# Patient Record
Sex: Female | Born: 1945 | Race: White | Hispanic: No | Smoking: Former smoker
Health system: Southern US, Community
[De-identification: ages and names within clinical notes are randomized; demographics above are authoritative.]

## PROBLEM LIST (undated history)

## (undated) DIAGNOSIS — J449 Chronic obstructive pulmonary disease, unspecified: Secondary | ICD-10-CM

## (undated) DIAGNOSIS — R519 Headache, unspecified: Secondary | ICD-10-CM

## (undated) DIAGNOSIS — K56699 Other intestinal obstruction unspecified as to partial versus complete obstruction: Secondary | ICD-10-CM

## (undated) DIAGNOSIS — K52831 Collagenous colitis: Secondary | ICD-10-CM

## (undated) DIAGNOSIS — F32A Depression, unspecified: Secondary | ICD-10-CM

## (undated) DIAGNOSIS — M1990 Unspecified osteoarthritis, unspecified site: Secondary | ICD-10-CM

## (undated) DIAGNOSIS — K5790 Diverticulosis of intestine, part unspecified, without perforation or abscess without bleeding: Secondary | ICD-10-CM

## (undated) DIAGNOSIS — T8859XA Other complications of anesthesia, initial encounter: Secondary | ICD-10-CM

## (undated) DIAGNOSIS — I639 Cerebral infarction, unspecified: Secondary | ICD-10-CM

## (undated) DIAGNOSIS — G245 Blepharospasm: Secondary | ICD-10-CM

## (undated) DIAGNOSIS — K660 Peritoneal adhesions (postprocedural) (postinfection): Secondary | ICD-10-CM

## (undated) DIAGNOSIS — K649 Unspecified hemorrhoids: Secondary | ICD-10-CM

## (undated) DIAGNOSIS — C801 Malignant (primary) neoplasm, unspecified: Secondary | ICD-10-CM

## (undated) DIAGNOSIS — Z8719 Personal history of other diseases of the digestive system: Secondary | ICD-10-CM

## (undated) DIAGNOSIS — D509 Iron deficiency anemia, unspecified: Secondary | ICD-10-CM

## (undated) DIAGNOSIS — K529 Noninfective gastroenteritis and colitis, unspecified: Secondary | ICD-10-CM

## (undated) DIAGNOSIS — M87051 Idiopathic aseptic necrosis of right femur: Secondary | ICD-10-CM

## (undated) DIAGNOSIS — K259 Gastric ulcer, unspecified as acute or chronic, without hemorrhage or perforation: Secondary | ICD-10-CM

## (undated) DIAGNOSIS — K311 Adult hypertrophic pyloric stenosis: Secondary | ICD-10-CM

## (undated) DIAGNOSIS — F329 Major depressive disorder, single episode, unspecified: Secondary | ICD-10-CM

## (undated) DIAGNOSIS — E162 Hypoglycemia, unspecified: Secondary | ICD-10-CM

## (undated) HISTORY — PX: BREAST SURGERY: SHX581

## (undated) HISTORY — PX: CATARACT EXTRACTION: SUR2

## (undated) HISTORY — PX: LYSIS OF ADHESION: SHX5961

## (undated) HISTORY — DX: Unspecified hemorrhoids: K64.9

## (undated) HISTORY — PX: JOINT REPLACEMENT: SHX530

## (undated) HISTORY — PX: OTHER SURGICAL HISTORY: SHX169

## (undated) HISTORY — PX: LAPAROSCOPIC LYSIS OF ADHESIONS: SHX5905

## (undated) HISTORY — DX: Cerebral infarction, unspecified: I63.9

## (undated) HISTORY — PX: ESOPHAGOSCOPY WITH DILITATION: SHX5618

## (undated) HISTORY — PX: GASTRECTOMY: SHX58

## (undated) HISTORY — DX: Malignant (primary) neoplasm, unspecified: C80.1

## (undated) HISTORY — PX: EYE SURGERY: SHX253

## (undated) HISTORY — DX: Gastric ulcer, unspecified as acute or chronic, without hemorrhage or perforation: K25.9

## (undated) HISTORY — DX: Unspecified osteoarthritis, unspecified site: M19.90

---
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
## 1971-01-30 HISTORY — PX: PLACEMENT OF BREAST IMPLANTS: SHX6334

## 1987-01-30 HISTORY — PX: APPENDECTOMY: SHX54

## 2003-01-30 HISTORY — PX: COLON RESECTION: SHX5231

## 2003-05-24 ENCOUNTER — Other Ambulatory Visit: Payer: Self-pay

## 2004-01-03 ENCOUNTER — Ambulatory Visit: Payer: Self-pay | Admitting: General Surgery

## 2004-01-30 HISTORY — PX: COLOSTOMY REVERSAL: SHX5782

## 2005-04-16 ENCOUNTER — Ambulatory Visit: Payer: Self-pay | Admitting: General Surgery

## 2005-06-21 ENCOUNTER — Ambulatory Visit: Payer: Self-pay | Admitting: General Surgery

## 2005-07-06 ENCOUNTER — Ambulatory Visit: Payer: Self-pay | Admitting: General Surgery

## 2005-07-10 ENCOUNTER — Ambulatory Visit: Payer: Self-pay | Admitting: General Surgery

## 2005-08-15 ENCOUNTER — Emergency Department: Payer: Self-pay

## 2005-08-15 ENCOUNTER — Other Ambulatory Visit: Payer: Self-pay

## 2006-02-05 ENCOUNTER — Ambulatory Visit: Payer: Self-pay | Admitting: Ophthalmology

## 2006-02-26 ENCOUNTER — Ambulatory Visit: Payer: Self-pay | Admitting: Ophthalmology

## 2006-04-11 ENCOUNTER — Ambulatory Visit: Payer: Self-pay | Admitting: Internal Medicine

## 2007-08-07 ENCOUNTER — Ambulatory Visit: Payer: Self-pay | Admitting: Internal Medicine

## 2008-03-11 ENCOUNTER — Ambulatory Visit: Payer: Self-pay | Admitting: Unknown Physician Specialty

## 2008-03-18 ENCOUNTER — Ambulatory Visit: Payer: Self-pay | Admitting: Unknown Physician Specialty

## 2008-04-01 ENCOUNTER — Ambulatory Visit: Payer: Self-pay | Admitting: Unknown Physician Specialty

## 2008-04-26 ENCOUNTER — Ambulatory Visit: Payer: Self-pay | Admitting: Unknown Physician Specialty

## 2008-07-27 ENCOUNTER — Ambulatory Visit: Payer: Self-pay | Admitting: Specialist

## 2008-10-29 ENCOUNTER — Ambulatory Visit: Payer: Self-pay | Admitting: General Surgery

## 2008-10-29 HISTORY — PX: UPPER GASTROINTESTINAL ENDOSCOPY: SHX188

## 2008-10-29 HISTORY — PX: COLONOSCOPY: SHX174

## 2008-12-13 ENCOUNTER — Ambulatory Visit: Payer: Self-pay | Admitting: General Surgery

## 2008-12-29 ENCOUNTER — Ambulatory Visit: Payer: Self-pay | Admitting: General Surgery

## 2009-01-29 DIAGNOSIS — C801 Malignant (primary) neoplasm, unspecified: Secondary | ICD-10-CM

## 2009-01-29 HISTORY — PX: LUNG CANCER SURGERY: SHX702

## 2009-01-29 HISTORY — DX: Malignant (primary) neoplasm, unspecified: C80.1

## 2009-02-03 ENCOUNTER — Ambulatory Visit: Payer: Self-pay | Admitting: General Surgery

## 2009-02-10 ENCOUNTER — Ambulatory Visit: Payer: Self-pay | Admitting: Specialist

## 2009-02-21 ENCOUNTER — Ambulatory Visit: Payer: Self-pay | Admitting: Specialist

## 2009-03-01 ENCOUNTER — Ambulatory Visit: Payer: Self-pay | Admitting: Oncology

## 2009-03-02 ENCOUNTER — Ambulatory Visit: Payer: Self-pay | Admitting: Oncology

## 2009-03-04 ENCOUNTER — Ambulatory Visit: Payer: Self-pay | Admitting: General Surgery

## 2009-03-09 ENCOUNTER — Inpatient Hospital Stay: Payer: Self-pay | Admitting: General Surgery

## 2009-03-29 ENCOUNTER — Ambulatory Visit: Payer: Self-pay | Admitting: Oncology

## 2009-04-11 ENCOUNTER — Emergency Department: Payer: Self-pay | Admitting: Emergency Medicine

## 2009-04-14 ENCOUNTER — Ambulatory Visit: Payer: Self-pay | Admitting: General Surgery

## 2009-05-24 ENCOUNTER — Ambulatory Visit: Payer: Self-pay | Admitting: General Surgery

## 2009-06-30 ENCOUNTER — Ambulatory Visit: Payer: Self-pay | Admitting: General Surgery

## 2009-10-04 ENCOUNTER — Ambulatory Visit: Payer: Self-pay | Admitting: Specialist

## 2009-12-06 ENCOUNTER — Ambulatory Visit: Payer: Self-pay | Admitting: Internal Medicine

## 2010-07-18 ENCOUNTER — Ambulatory Visit: Payer: Self-pay | Admitting: Specialist

## 2010-12-08 DIAGNOSIS — C3490 Malignant neoplasm of unspecified part of unspecified bronchus or lung: Secondary | ICD-10-CM | POA: Insufficient documentation

## 2011-12-24 ENCOUNTER — Ambulatory Visit: Payer: Self-pay | Admitting: Ophthalmology

## 2011-12-24 LAB — CREATININE, SERUM
Creatinine: 0.88 mg/dL (ref 0.60–1.30)
EGFR (African American): >60
EGFR (Non-African Amer.): >60

## 2013-11-09 ENCOUNTER — Encounter: Payer: Self-pay | Admitting: *Deleted

## 2013-11-11 ENCOUNTER — Encounter: Payer: Self-pay | Admitting: General Surgery

## 2013-11-11 ENCOUNTER — Ambulatory Visit (INDEPENDENT_AMBULATORY_CARE_PROVIDER_SITE_OTHER): Payer: Medicare Other | Admitting: General Surgery

## 2013-11-11 ENCOUNTER — Other Ambulatory Visit: Payer: Self-pay | Admitting: General Surgery

## 2013-11-11 VITALS — BP 126/80 | HR 80 | Resp 12 | Ht <= 58 in | Wt 91.4 lb

## 2013-11-11 DIAGNOSIS — K311 Adult hypertrophic pyloric stenosis: Secondary | ICD-10-CM

## 2013-11-11 NOTE — Patient Instructions (Addendum)
The patient is aware to call back for any questions or concerns.  Esophagogastroduodenoscopy Esophagogastroduodenoscopy (EGD) is a procedure to examine the lining of the esophagus, stomach, and first part of the small intestine (duodenum). A long, flexible, lighted tube with a camera attached (endoscope) is inserted down the throat to view these organs. This procedure is done to detect problems or abnormalities, such as inflammation, bleeding, ulcers, or growths, in order to treat them. The procedure lasts about 5-20 minutes. It is usually an outpatient procedure, but it may need to be performed in emergency cases in the hospital. LET YOUR CAREGIVER KNOW ABOUT:   Allergies to food or medicine.  All medicines you are taking, including vitamins, herbs, eyedrops, and over-the-counter medicines and creams.  Use of steroids (by mouth or creams).  Previous problems you or members of your family have had with the use of anesthetics.  Any blood disorders you have.  Previous surgeries you have had.  Other health problems you have.  Possibility of pregnancy, if this applies. RISKS AND COMPLICATIONS  Generally, EGD is a safe procedure. However, as with any procedure, complications can occur. Possible complications include:  Infection.  Bleeding.  Tearing (perforation) of the esophagus, stomach, or duodenum.  Difficulty breathing or not being able to breath.  Excessive sweating.  Spasms of the larynx.  Slowed heartbeat.  Low blood pressure. BEFORE THE PROCEDURE  Do not eat or drink anything for 6-8 hours before the procedure or as directed by your caregiver.  Ask your caregiver about changing or stopping your regular medicines.  If you wear dentures, be prepared to remove them before the procedure.  Arrange for someone to drive you home after the procedure. PROCEDURE   A vein will be accessed to give medicines and fluids. A medicine to relax you (sedative) and a pain reliever  will be given through that access into the vein.  A numbing medicine (local anesthetic) may be sprayed on your throat for comfort and to stop you from gagging or coughing.  A mouth guard may be placed in your mouth to protect your teeth and to keep you from biting on the endoscope.  You will be asked to lie on your left side.  The endoscope is inserted down your throat and into the esophagus, stomach, and duodenum.  Air is put through the endoscope to allow your caregiver to view the lining of your esophagus clearly.  The esophagus, stomach, and duodenum is then examined. During the exam, your caregiver may:  Remove tissue to be examined under a microscope (biopsy) for inflammation, infection, or other medical problems.  Remove growths.  Remove objects (foreign bodies) that are stuck.  Treat any bleeding with medicines or other devices that stop tissues from bleeding (hot cautery, clipping devices).  Widen (dilate) or stretch narrowed areas of the esophagus and stomach.  The endoscope will then be withdrawn. AFTER THE PROCEDURE  You will be taken to a recovery area to be monitored. You will be able to go home once you are stable and alert.  Do not eat or drink anything until the local anesthetic and numbing medicines have worn off. You may choke.  It is normal to feel bloated, have pain with swallowing, or have a sore throat for a short time. This will wear off.  Your caregiver should be able to discuss his or her findings with you. It will take longer to discuss the test results if any biopsies were taken. Document Released: 05/18/2004 Document Revised: 06/01/2013  Document Reviewed: 12/19/2011 [facility] Patient Information 2015 [place], [place]. This information is not intended to replace advice given to you by your health care provider. Make sure you discuss any questions you have with your health care provider.  Patient has been scheduled for an upper endoscopy on 11-18-13 at  [facility].

## 2013-11-11 NOTE — Progress Notes (Signed)
Patient ID: Carol Shaffer, female   DOB: 09/25/1945, 68 y.o.   MRN: [id number]  Chief Complaint  Patient presents with  . Nausea    nausea and vomiting    HPI Carol Shaffer is a 68 y.o. female.  Here today for evaluation of abdominal pain, nausea and vomiting. She states that the symptoms started about 5 days ago. Denies difficulty swallowing. When she eats solid foods she has pain, nausea and vomiting, but when she does just liquids and protein shakes she has nausea but no pain. No significant weight loss. She reports similar symptoms in 2012. At that time she was evaluated at [facility] it underwent balloon dilatation of her gastrojejunostomy. She had been asymptomatic since November of 2012 after the second dilatation.  The patient has had multiple previous abdominal procedures.  Bowels moving normally.  HPI  Past Medical History  Diagnosis Date  . Stroke   . Arthritis   . Multiple gastric ulcers   . Hemorrhoid   . Cancer 2011    lung    Past Surgical History  Procedure Laterality Date  . Colonoscopy  10-29-2008    [name]  . Cataract extraction    . Lysis of adhesion    . Colon resection  2005  . Colostomy reversal  2006  . Gastric ulcer  1989, 1991  . Appendectomy  1989  . Placement of breast implants  1973  . Esophagoscopy with dilitation  2012    [facility]  . Lung cancer surgery  2011  . Upper gastrointestinal endoscopy  10-29-2008    [name]    No family history on file.  Social History History  Substance Use Topics  . Smoking status: Former Smoker -- 25 years    Quit date: 01/30/2008  . Smokeless tobacco: Never Used  . Alcohol Use: Yes     Comment: wine    Allergies  Allergen Reactions  . Biaxin [Clarithromycin] Other (See Comments)    hallucinations  . Cortizone-10 [Hydrocortisone] Swelling    injection  . Reglan [Metoclopramide] Other (See Comments)    tremors    Current Outpatient Prescriptions  Medication Sig Dispense Refill  .  HYDROcodone-acetaminophen (NORCO/VICODIN) 5-325 MG per tablet Take 1 tablet by mouth every 6 (six) hours as needed.       . pantoprazole (PROTONIX) 40 MG tablet Take 40 mg by mouth 2 (two) times daily.       . sucralfate (CARAFATE) 1 G tablet Take 1 g by mouth 4 (four) times daily.       . traZODone (DESYREL) 150 MG tablet Take 300 mg by mouth at bedtime.       Marland Kitchen venlafaxine XR (EFFEXOR-XR) 150 MG 24 hr capsule Take 150 mg by mouth daily with breakfast.        No current facility-administered medications for this visit.    Review of Systems Review of Systems  Constitutional: Negative.   Respiratory: Negative.   Cardiovascular: Negative.   Gastrointestinal: Positive for nausea, vomiting and abdominal pain.    Blood pressure 126/80, pulse 80, resp. rate 12, height 4' 8.5" (1.435 m), weight 91 lb 6.4 oz (41.459 kg).  Physical Exam Physical Exam  Constitutional: She is oriented to person, place, and time. She appears well-developed and well-nourished.  Neck: Neck supple.  Cardiovascular: Normal rate, regular rhythm and normal heart sounds.   Pulmonary/Chest: Effort normal and breath sounds normal.  Abdominal: Soft. Normal appearance. A hernia is present.    1 cm  defect at old stoma site.  Lymphadenopathy:    She has no cervical adenopathy.  Neurological: She is alert and oriented to person, place, and time.  Skin: Skin is warm and dry.    Data Reviewed [facility] records from November 2012.  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     Plan    Plan EGD with dilatation. The patient has been encouraged to remain on a high protein low residue diet between now and the time of her upcoming endoscopy. The opportunity to have the procedure completed by another physician at an earlier date was offered and declined..  Patient has been scheduled for an upper endoscopy on 11-18-13 at [facility].    PCP/Ref: Dr. Grayce Sessions, [place] Gleason 11/11/2013, 10:30 PM

## 2013-11-17 ENCOUNTER — Ambulatory Visit: Payer: Self-pay | Admitting: General Surgery

## 2013-11-18 ENCOUNTER — Ambulatory Visit: Payer: Self-pay | Admitting: General Surgery

## 2013-11-18 DIAGNOSIS — K3189 Other diseases of stomach and duodenum: Secondary | ICD-10-CM

## 2013-11-19 ENCOUNTER — Encounter: Payer: Self-pay | Admitting: General Surgery

## 2013-11-30 ENCOUNTER — Encounter: Payer: Self-pay | Admitting: General Surgery

## 2013-12-02 ENCOUNTER — Encounter: Payer: Self-pay | Admitting: General Surgery

## 2013-12-02 ENCOUNTER — Ambulatory Visit (INDEPENDENT_AMBULATORY_CARE_PROVIDER_SITE_OTHER): Payer: Self-pay | Admitting: General Surgery

## 2013-12-02 VITALS — BP 130/66 | HR 70 | Resp 12 | Ht <= 58 in | Wt 89.0 lb

## 2013-12-02 DIAGNOSIS — K311 Adult hypertrophic pyloric stenosis: Secondary | ICD-10-CM

## 2013-12-02 NOTE — Patient Instructions (Signed)
Patient has been scheduled for an upper endoscopy on 12-30-13 at [facility].

## 2013-12-02 NOTE — Progress Notes (Signed)
Patient ID: Carol Shaffer, female   DOB: 02-16-1945, 68 y.o.   MRN: [id number]  Chief Complaint  Patient presents with  . Follow-up    post op upper endo and dilitation    HPI Carol Shaffer is a 68 y.o. female who presents for a post op upper endoscopy an dilatation. The procedure was performed on 11/18/13. The patient reports a significant improvement in her ability to eat, although she still is having some sense of fullness after meals. She has been very careful with her diet.   HPI  Past Medical History  Diagnosis Date  . Stroke   . Arthritis   . Multiple gastric ulcers   . Hemorrhoid   . Cancer 2011    lung    Past Surgical History  Procedure Laterality Date  . Colonoscopy  10-29-2008    [name]  . Cataract extraction    . Lysis of adhesion    . Colon resection  2005  . Colostomy reversal  2006  . Gastric ulcer  1989, 1991  . Appendectomy  1989  . Placement of breast implants  1973  . Esophagoscopy with dilitation  2012,2015    [facility], Carol Shaffer  . Lung cancer surgery  2011  . Upper gastrointestinal endoscopy  10-29-2008    [name]    No family history on file.  Social History History  Substance Use Topics  . Smoking status: Former Smoker -- 25 years    Quit date: 01/30/2008  . Smokeless tobacco: Never Used  . Alcohol Use: Yes     Comment: wine    Allergies  Allergen Reactions  . Bentyl [Dicyclomine] Nausea And Vomiting  . Biaxin [Clarithromycin] Other (See Comments)    hallucinations  . Cortizone-10 [Hydrocortisone] Swelling    injection  . Erythromycin Nausea And Vomiting  . Reglan [Metoclopramide] Other (See Comments)    tremors    Current Outpatient Prescriptions  Medication Sig Dispense Refill  . HYDROcodone-acetaminophen (NORCO/VICODIN) 5-325 MG per tablet Take 1 tablet by mouth every 6 (six) hours as needed.     . Multiple Vitamin (MULTI-VITAMINS) TABS once daily.    . pantoprazole (PROTONIX) 40 MG tablet Take 40 mg by mouth 2 (two) times  daily.     . sucralfate (CARAFATE) 1 G tablet Take 1 g by mouth 4 (four) times daily.     . traZODone (DESYREL) 150 MG tablet Take 300 mg by mouth at bedtime.     Marland Kitchen venlafaxine XR (EFFEXOR-XR) 150 MG 24 hr capsule Take 150 mg by mouth daily with breakfast.      No current facility-administered medications for this visit.    Review of Systems Review of Systems  Constitutional: Negative.   Respiratory: Negative.   Cardiovascular: Negative.   Gastrointestinal: Negative.     Blood pressure 130/66, pulse 70, resp. rate 12, height 4' 8.5" (1.435 m), weight 89 lb (40.37 kg).  Physical Exam Physical Exam  Constitutional: She is oriented to person, place, and time. She appears well-developed and well-nourished.  Cardiovascular: Normal rate, regular rhythm and normal heart sounds.   No murmur heard. Pulmonary/Chest: Effort normal and breath sounds normal.  Neurological: She is alert and oriented to person, place, and time.  Skin: Skin is warm and dry.    Data Reviewed EGD report.  Assessment    Improvement in dietary tolerance post anastomotic dilatation.     Plan    When she required dilatation in the past, she had obtained additional benefit from  a second dilatation. She has a planned trip to walk [employer]. We'll schedule the procedure after her return. In the interim, the avoidance of hard to digest foods were to be discouraged.  The risks associated with dilatation were again reviewed, these include perforation and bleeding.  Patient has been scheduled for an upper endoscopy on 12-30-13 at [facility].    PCP:  Henderson Baltimore 12/04/2013, 8:06 AM

## 2013-12-04 ENCOUNTER — Other Ambulatory Visit: Payer: Self-pay | Admitting: General Surgery

## 2013-12-04 DIAGNOSIS — K311 Adult hypertrophic pyloric stenosis: Secondary | ICD-10-CM

## 2013-12-30 ENCOUNTER — Ambulatory Visit: Payer: Self-pay | Admitting: General Surgery

## 2013-12-30 DIAGNOSIS — K311 Adult hypertrophic pyloric stenosis: Secondary | ICD-10-CM

## 2013-12-31 ENCOUNTER — Encounter: Payer: Self-pay | Admitting: General Surgery

## 2014-01-14 ENCOUNTER — Encounter: Payer: Self-pay | Admitting: General Surgery

## 2016-06-15 ENCOUNTER — Encounter: Payer: Self-pay | Admitting: General Surgery

## 2016-07-03 ENCOUNTER — Encounter: Payer: Self-pay | Admitting: General Surgery

## 2016-07-03 ENCOUNTER — Ambulatory Visit (INDEPENDENT_AMBULATORY_CARE_PROVIDER_SITE_OTHER): Payer: Medicare Other | Admitting: General Surgery

## 2016-07-03 VITALS — BP 140/80 | HR 100 | Resp 14 | Ht 59.0 in | Wt 89.0 lb

## 2016-07-03 DIAGNOSIS — R112 Nausea with vomiting, unspecified: Secondary | ICD-10-CM | POA: Diagnosis not present

## 2016-07-03 HISTORY — DX: Nausea with vomiting, unspecified: R11.2

## 2016-07-03 NOTE — Progress Notes (Signed)
Patient ID: Carol Shaffer, female   DOB: [birth year], 71 y.o.   MRN: [id number]  Chief Complaint  Patient presents with  . Other    vomiting with stricture    HPI Carol Shaffer is a 71 y.o. female.  Here today for evaluation of esophageal stricture. She states she has had some vomiting. She states this started about a month and she vomits three times a week. Vomitus typically consist of undigested food. Patient states she has been "vomiting" in the middle of the night.this sounds more like reflux based on her description. No heart burn or pain. Last meal is at 5 o'clock.   Weight is unchanged from that noted at the time of her November 2015 exam.   HPI  Past Medical History:  Diagnosis Date  . Arthritis   . Cancer ([place]) 2011   lung  . Hemorrhoid   . Multiple gastric ulcers   . Stroke Carol Shaffer)     Past Surgical History:  Procedure Laterality Date  . APPENDECTOMY  1989  . CATARACT EXTRACTION    . COLON RESECTION  2005  . COLONOSCOPY  10-29-2008   [name]  . COLOSTOMY REVERSAL  2006  . ESOPHAGOSCOPY WITH DILITATION  2012,2015   [facility], [place]  . gastric ulcer  1989, 1991  . LUNG CANCER SURGERY  2011  . LYSIS OF ADHESION    . PLACEMENT OF BREAST IMPLANTS  1973  . UPPER GASTROINTESTINAL ENDOSCOPY  10-29-2008   [name]    No family history on file.  Social History Social History  Substance Use Topics  . Smoking status: Former Smoker    Years: 25.00    Quit date: 01/30/2008  . Smokeless tobacco: Never Used  . Alcohol use Yes     Comment: wine    Allergies  Allergen Reactions  . Bentyl [Dicyclomine] Nausea And Vomiting  . Biaxin [Clarithromycin] Other (See Comments)    hallucinations  . Cortizone-10 [Hydrocortisone] Swelling    injection  . Erythromycin Nausea And Vomiting  . Reglan [Metoclopramide] Other (See Comments)    tremors    Current Outpatient Prescriptions  Medication Sig Dispense Refill  . HYDROcodone-acetaminophen (NORCO/VICODIN) 5-325 MG per  tablet Take 1 tablet by mouth every 6 (six) hours as needed.     . Multiple Vitamin (MULTI-VITAMINS) TABS once daily.    . pantoprazole (PROTONIX) 40 MG tablet Take 40 mg by mouth 2 (two) times daily.     . sucralfate (CARAFATE) 1 G tablet Take 1 g by mouth 4 (four) times daily.     . traZODone (DESYREL) 150 MG tablet Take 300 mg by mouth at bedtime.     Marland Kitchen venlafaxine XR (EFFEXOR-XR) 150 MG 24 hr capsule Take 150 mg by mouth daily with breakfast.      No current facility-administered medications for this visit.     Review of Systems Review of Systems  Constitutional: Negative.   Respiratory: Negative.   Cardiovascular: Negative.   Gastrointestinal: Positive for nausea and vomiting.    Blood pressure 140/80, pulse 100, resp. rate 14, height 4' 11"  (1.499 m), weight 89 lb (40.4 kg).  Physical Exam Physical Exam  Constitutional: She is oriented to person, place, and time. She appears well-developed and well-nourished.  Eyes: Conjunctivae are normal. No scleral icterus.  Neck: Neck supple.  Cardiovascular: Normal rate, regular rhythm and normal heart sounds.   Pulmonary/Chest: Effort normal and breath sounds normal.  Abdominal: Soft. Bowel sounds are normal. There is no tenderness.  Lymphadenopathy:    She has no cervical adenopathy.  Neurological: She is alert and oriented to person, place, and time.  Skin: Skin is warm and dry.    Data Reviewed The 11/18/2013 upper endoscopy showed evidence of a stenosis at her gastroduodenostomy. This was dilated to 15 mm with a TTS balloon dilator.  Follow-up endoscopy dated 12/30/2013 showed improvement in the anastomosis but it was at this time possible to dilate to 18 mm with a TTS system balloon dilator. Previously noted stenosis had resolved.  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    Plan    Pros and cons of elective dilatation reviewed. Risks associated with the procedure were discussed.  The possibility of perforation during dilatation was reviewed.   Patient to use blocks under the head of her bed frame to minimize episodic nocturnal reflux. .  Two days before surgery stay way from hard to digest foods    HPI, Physical Exam, Assessment and Plan have been scribed under the direction and in the presence of Hervey Ard, MD.  Gaspar Cola, CMA  The patient is scheduled for an EGD with dilation at [facility] on 07/18/16. They are aware to call the day before to get their arrival time. The patient is aware of date and instructions.  Documented by Lesly Rubenstein LPN  I have completed the exam and reviewed the above documentation for accuracy and completeness.  I agree with the above.  Haematologist has been used and any errors in dictation or transcription are unintentional.  Hervey Ard, M.D., F.A.C.S.   Robert Bellow 07/03/2016, 7:15 PM

## 2016-07-03 NOTE — Patient Instructions (Addendum)
Patient to use blocks under her bed frame.  Two days before surgery stay way from hard to digest foods  The patient is scheduled for an EGD with dilation at [facility] on 07/18/16. They are aware to call the day before to get their arrival time. The patient is aware of date and instructions.

## 2016-07-18 ENCOUNTER — Ambulatory Visit: Payer: Medicare Other | Admitting: Anesthesiology

## 2016-07-18 ENCOUNTER — Encounter
Admission: RE | Disposition: A | Discharge status: Home / Self Care | Payer: Self-pay | Source: Ambulatory Visit | Attending: General Surgery

## 2016-07-18 ENCOUNTER — Ambulatory Visit
Admission: RE | Admit: 2016-07-18 | Discharge: 2016-07-18 | Disposition: A | Discharge status: Home / Self Care | Payer: Medicare Other | Source: Ambulatory Visit | Attending: General Surgery | Admitting: General Surgery

## 2016-07-18 DIAGNOSIS — Z8711 Personal history of peptic ulcer disease: Secondary | ICD-10-CM | POA: Insufficient documentation

## 2016-07-18 DIAGNOSIS — R12 Heartburn: Secondary | ICD-10-CM | POA: Diagnosis not present

## 2016-07-18 DIAGNOSIS — K2960 Other gastritis without bleeding: Secondary | ICD-10-CM | POA: Insufficient documentation

## 2016-07-18 DIAGNOSIS — Z87891 Personal history of nicotine dependence: Secondary | ICD-10-CM | POA: Diagnosis not present

## 2016-07-18 DIAGNOSIS — K30 Functional dyspepsia: Secondary | ICD-10-CM | POA: Diagnosis not present

## 2016-07-18 DIAGNOSIS — Z934 Other artificial openings of gastrointestinal tract status: Secondary | ICD-10-CM | POA: Diagnosis not present

## 2016-07-18 DIAGNOSIS — Z8673 Personal history of transient ischemic attack (TIA), and cerebral infarction without residual deficits: Secondary | ICD-10-CM | POA: Insufficient documentation

## 2016-07-18 DIAGNOSIS — Z980 Intestinal bypass and anastomosis status: Secondary | ICD-10-CM | POA: Diagnosis not present

## 2016-07-18 DIAGNOSIS — K2980 Duodenitis without bleeding: Secondary | ICD-10-CM | POA: Diagnosis not present

## 2016-07-18 DIAGNOSIS — R112 Nausea with vomiting, unspecified: Secondary | ICD-10-CM

## 2016-07-18 HISTORY — PX: ESOPHAGOGASTRODUODENOSCOPY (EGD) WITH PROPOFOL: SHX5813

## 2016-07-18 SURGERY — ESOPHAGOGASTRODUODENOSCOPY (EGD) WITH PROPOFOL
Anesthesia: General

## 2016-07-18 MED ORDER — LIDOCAINE HCL (PF) 2 % IJ SOLN
INTRAMUSCULAR | Status: AC
  Filled 2016-07-18: qty 2

## 2016-07-18 MED ORDER — LIDOCAINE HCL (PF) 2 % IJ SOLN
INTRAMUSCULAR | Status: DC | PRN
  Administered 2016-07-18: 50 mg via INTRADERMAL

## 2016-07-18 MED ORDER — SODIUM CHLORIDE 0.9 % IV SOLN
INTRAVENOUS | Status: DC
  Administered 2016-07-18: 1000 mL via INTRAVENOUS

## 2016-07-18 MED ORDER — PROPOFOL 500 MG/50ML IV EMUL
INTRAVENOUS | Status: AC
  Filled 2016-07-18: qty 50

## 2016-07-18 MED ORDER — PROPOFOL 10 MG/ML IV BOLUS
INTRAVENOUS | Status: DC | PRN
  Administered 2016-07-18: 80 mg via INTRAVENOUS
  Administered 2016-07-18: 10 mg via INTRAVENOUS
  Administered 2016-07-18 (×5): 20 mg via INTRAVENOUS
  Administered 2016-07-18: 10 mg via INTRAVENOUS
  Administered 2016-07-18: 30 mg via INTRAVENOUS
  Administered 2016-07-18: 10 mg via INTRAVENOUS
  Administered 2016-07-18: 20 mg via INTRAVENOUS
  Administered 2016-07-18 (×4): 10 mg via INTRAVENOUS

## 2016-07-18 NOTE — Op Note (Signed)
[facility] Gastroenterology Patient Name: Carol Shaffer Procedure Date: 07/18/2016 8:57 AM MRN: [id number] Account #: [id number] Date of Birth: 04-04-1945 Admit Type: Outpatient Age: 71 Room: [facility] ENDO ROOM 1 Gender: Female Note Status: Finalized Procedure:            Upper GI endoscopy Indications:          Functional Dyspepsia, Heartburn, Suspected stenosis of                        the stomach Providers:            Robert Bellow, MD Referring MD:         Mikeal Hawthorne. Brynda Greathouse MD, MD (Referring MD) Medicines:            Monitored Anesthesia Care Complications:        No immediate complications. Procedure:            Pre-Anesthesia Assessment:                       - Prior to the procedure, a History and Physical was                        performed, and patient medications, allergies and                        sensitivities were reviewed. The patient's tolerance of                        previous anesthesia was reviewed.                       - The risks and benefits of the procedure and the                        sedation options and risks were discussed with the                        patient. All questions were answered and informed                        consent was obtained.                       After obtaining informed consent, the endoscope was                        passed under direct vision. Throughout the procedure,                        the patient's blood pressure, pulse, and oxygen                        saturations were monitored continuously. The                        Colonoscope was introduced through the mouth, and                        advanced to the second part of duodenum. The upper GI  endoscopy was accomplished without difficulty. The                        patient tolerated the procedure well. Findings:      The esophagus was normal.      The examined duodenum was normal.      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 A       gastric pouch was found containing a bezoar and suture material. The       gastroduodenal anastomosis was characterized by edema, erosion, erythema       and friable mucosa. This was traversed after dilation. A TTS dilator was       passed through the scope. Dilation with a 12-13.5-15 mm pyloric balloon       dilator was performed. The dilation site was examined and showed       complete resolution of luminal narrowing. This was biopsied with a cold       forceps for histology. Impression:           - Normal esophagus.                       - Normal examined duodenum.                       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 Dilated. Biopsied. Recommendation:       - Await pathology results.                       - Return to endoscopist in 2 weeks. Procedure Code(s):    --- Professional ---                       [phone], Esophagogastroduodenoscopy, flexible, transoral;                        with dilation of gastric/duodenal stricture(s) (eg,                        balloon, bougie)                       43239, Esophagogastroduodenoscopy, flexible, transoral;                        with biopsy, single or multiple Diagnosis Code(s):    --- Professional ---                       R12, Heartburn                       K30, Functional dyspepsia                       Z98.0, Intestinal bypass and anastomosis status CPT copyright 2016 American Medical Association. All rights reserved. The codes documented in this report are preliminary and upon coder review may  be revised to meet current compliance requirements.  Robert Bellow, MD 07/18/2016 9:31:06 AM This report has been signed electronically. Number of Addenda: 0 Note Initiated On: 07/18/2016 8:57 AM      [facility]

## 2016-07-18 NOTE — H&P (Signed)
No change in clinical history very exam since preop visit.  Review of 2015 endoscopies showed stenosis at the gastroduodenostomy. Previously dilated in 2 stages.  Plan: Upper endoscopy with dilatation as indicated.

## 2016-07-18 NOTE — Anesthesia Postprocedure Evaluation (Signed)
Anesthesia Post Note  Patient: Carol Shaffer  Procedure(s) Performed: Procedure(s) (LRB): ESOPHAGOGASTRODUODENOSCOPY (EGD) WITH PROPOFOL (N/A)  Patient location during evaluation: PACU Anesthesia Type: General Level of consciousness: awake Pain management: pain level controlled Vital Signs Assessment: post-procedure vital signs reviewed and stable Respiratory status: spontaneous breathing Cardiovascular status: stable Anesthetic complications: no     Last Vitals:  Vitals:   07/18/16 0930 07/18/16 0939  BP: 136/71 107/62  Pulse: 94 79  Resp: 13 (!) 21  Temp: 36.4 C     Last Pain:  Vitals:   07/18/16 0930  TempSrc: Tympanic  PainSc:                  VAN STAVEREN,Carol Vankleeck

## 2016-07-18 NOTE — Anesthesia Preprocedure Evaluation (Signed)
Anesthesia Evaluation  Patient identified by MRN, date of birth, ID band Patient awake    Reviewed: Allergy & Precautions, NPO status , Patient's Chart, lab work & pertinent test results  Airway Mallampati: III       Dental  (+) Teeth Intact   Pulmonary shortness of breath, former smoker,  Lobectomy   breath sounds clear to auscultation       Cardiovascular Exercise Tolerance: Good  Rhythm:Regular     Neuro/Psych CVA, No Residual Symptoms    GI/Hepatic Neg liver ROS, PUD,   Endo/Other  negative endocrine ROS  Renal/GU negative Renal ROS     Musculoskeletal   Abdominal Normal abdominal exam  (+)   Peds  Hematology negative hematology ROS (+)   Anesthesia Other Findings   Reproductive/Obstetrics                             Anesthesia Physical Anesthesia Plan  ASA: III  Anesthesia Plan: General   Post-op Pain Management:    Induction: Intravenous  PONV Risk Score and Plan: 0  Airway Management Planned: Natural Airway and Nasal Cannula  Additional Equipment:   Intra-op Plan:   Post-operative Plan:   Informed Consent: I have reviewed the patients History and Physical, chart, labs and discussed the procedure including the risks, benefits and alternatives for the proposed anesthesia with the patient or authorized representative who has indicated his/her understanding and acceptance.     Plan Discussed with: CRNA  Anesthesia Plan Comments:         Anesthesia Quick Evaluation

## 2016-07-18 NOTE — Transfer of Care (Signed)
Immediate Anesthesia Transfer of Care Note  Patient: Carol Shaffer  Procedure(s) Performed: Procedure(s): ESOPHAGOGASTRODUODENOSCOPY (EGD) WITH PROPOFOL (N/A)  Patient Location: PACU  Anesthesia Type:General  Level of Consciousness: awake and alert   Airway & Oxygen Therapy: Patient connected to nasal cannula oxygen  Post-op Assessment: Report given to RN and Post -op Vital signs reviewed and stable  Post vital signs: Reviewed and stable  Last Vitals:  Vitals:   07/18/16 0929 07/18/16 0930  BP: 136/71 136/71  Pulse: 89   Resp: 10 13  Temp: 36.4 C 36.4 C    Last Pain:  Vitals:   07/18/16 0930  TempSrc: Tympanic  PainSc:          Complications: No apparent anesthesia complications

## 2016-07-18 NOTE — Anesthesia Post-op Follow-up Note (Cosign Needed)
Anesthesia QCDR form completed.        

## 2016-07-19 ENCOUNTER — Encounter: Payer: Self-pay | Admitting: General Surgery

## 2016-07-19 LAB — SURGICAL PATHOLOGY

## 2016-07-20 ENCOUNTER — Telehealth: Payer: Self-pay

## 2016-07-20 NOTE — Telephone Encounter (Signed)
-----   Message from Robert Bellow, MD sent at 07/20/2016  7:06 AM EDT ----- Please notify the patient and all the biopsies were fine. She underwent dilatation for delayed gastric emptying. See how she's doing. She should have a follow-up appointment scheduled. Thank you ----- Message ----- From: Interface, Lab In Three Zero One Sent: 07/19/2016   9:29 AM To: Robert Bellow, MD

## 2016-07-20 NOTE — Telephone Encounter (Signed)
Notified patient as instructed, patient pleased. Discussed follow-up appointments, patient agrees. She is doing well, just some pain from the dilatation but it is manageable.

## 2016-07-31 ENCOUNTER — Ambulatory Visit (INDEPENDENT_AMBULATORY_CARE_PROVIDER_SITE_OTHER): Payer: Medicare Other | Admitting: General Surgery

## 2016-07-31 ENCOUNTER — Encounter: Payer: Self-pay | Admitting: General Surgery

## 2016-07-31 VITALS — BP 138/72 | HR 80 | Resp 14 | Ht 59.0 in | Wt 87.0 lb

## 2016-07-31 DIAGNOSIS — R112 Nausea with vomiting, unspecified: Secondary | ICD-10-CM

## 2016-07-31 DIAGNOSIS — K311 Adult hypertrophic pyloric stenosis: Secondary | ICD-10-CM

## 2016-07-31 MED ORDER — ONDANSETRON HCL 4 MG PO TABS: 4.0000 mg | ORAL_TABLET | Freq: Three times a day (TID) | ORAL | 0 refills | Status: DC | PRN

## 2016-07-31 NOTE — Patient Instructions (Signed)
The patient is scheduled for an upper endoscopy with dilatation at [facility] on 08/03/16. She is aware to call the day before for her arrival time. The patient is aware of date and instructions.

## 2016-07-31 NOTE — Progress Notes (Signed)
Patient ID: Carol Shaffer, female   DOB: 05-Dec-1945, 71 y.o.   MRN: [id number]  Chief Complaint  Patient presents with  . Follow-up    HPI Carol Shaffer is a 71 y.o. female here today for her post op upper endoscopy done on 07/18/2016. She states she is having a lot of nausea in the afternoon and abdomen pain.She had diarrhea two days after the procedure.  Drinking better and no heart burn. Moving her bowels daily.  HPI  Past Medical History:  Diagnosis Date  . Arthritis   . Cancer ([place]) 2011   lung  . Hemorrhoid   . Multiple gastric ulcers   . Stroke [facility])     Past Surgical History:  Procedure Laterality Date  . APPENDECTOMY  1989  . CATARACT EXTRACTION    . COLON RESECTION  2005  . COLONOSCOPY  10-29-2008   [name]  . COLOSTOMY REVERSAL  2006  . ESOPHAGOGASTRODUODENOSCOPY (EGD) WITH PROPOFOL N/A 07/18/2016   Procedure: ESOPHAGOGASTRODUODENOSCOPY (EGD) WITH PROPOFOL;  Surgeon: Robert Bellow, MD;  Location: [facility] ENDOSCOPY;  Service: Endoscopy;  Laterality: N/A;  . ESOPHAGOSCOPY WITH DILITATION  2012,2015   [facility], Kanika Bungert  . gastric ulcer  1989, 1991  . LUNG CANCER SURGERY  2011  . LYSIS OF ADHESION    . PLACEMENT OF BREAST IMPLANTS  1973  . UPPER GASTROINTESTINAL ENDOSCOPY  10-29-2008   [name]    No family history on file.  Social History Social History  Substance Use Topics  . Smoking status: Former Smoker    Years: 25.00    Quit date: 01/30/2008  . Smokeless tobacco: Never Used  . Alcohol use Yes     Comment: wine    Allergies  Allergen Reactions  . Bentyl [Dicyclomine] Nausea And Vomiting  . Biaxin [Clarithromycin] Other (See Comments)    hallucinations  . Cortizone-10 [Hydrocortisone] Swelling    injection  . Erythromycin Nausea And Vomiting  . Reglan [Metoclopramide] Other (See Comments)    tremors    Current Outpatient Prescriptions  Medication Sig Dispense Refill  . HYDROcodone-acetaminophen (NORCO/VICODIN) 5-325 MG per tablet Take 1  tablet by mouth every 6 (six) hours as needed.     . Multiple Vitamin (MULTI-VITAMINS) TABS once daily.    . pantoprazole (PROTONIX) 40 MG tablet Take 40 mg by mouth 2 (two) times daily.     . sucralfate (CARAFATE) 1 G tablet Take 1 g by mouth 4 (four) times daily.     . traZODone (DESYREL) 150 MG tablet Take 300 mg by mouth at bedtime.     Marland Kitchen venlafaxine XR (EFFEXOR-XR) 150 MG 24 hr capsule Take 150 mg by mouth daily with breakfast.     . ondansetron (ZOFRAN) 4 MG tablet Take 1 tablet (4 mg total) by mouth every 8 (eight) hours as needed for nausea or vomiting. 10 tablet 0   No current facility-administered medications for this visit.     Review of Systems Review of Systems  Constitutional: Negative.   Respiratory: Negative.   Cardiovascular: Negative.     Blood pressure 138/72, pulse 80, resp. rate 14, height 4' 11"  (1.499 m), weight 87 lb (39.5 kg). Weight is down 2 pounds.  Physical Exam Physical Exam  Constitutional: She is oriented to person, place, and time. She appears well-developed and well-nourished.  Cardiovascular: Normal rate, regular rhythm and normal heart sounds.   Pulmonary/Chest: Effort normal and breath sounds normal.  Abdominal: Soft. Bowel sounds are normal. There is no tenderness.  Neurological: She is alert and oriented to person, place, and time.  Skin: Skin is warm and dry.    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 Dilated to 15 mm with the TTS balloon system. Pronounced gastritis. DIAGNOSIS:  A. STOMACH, BODY; COLD BIOPSY:  - EROSIVE GASTRITIS WITH ULCERATION.  - SEPARATE FRAGMENT OF DUODENAL-TYPE MUCOSA WITH MILD ACTIVE DUODENITIS.  - NEGATIVE FOR H. PYLORI, DYSPLASIA, AND MALIGNANCY.    Assessment    Persistent incomplete gastric emptying, weight loss.    Plan    The patient had pronounced relief of her symptoms after her second dilatation several years ago 18 mm. It is reasonable to repeat her endoscopy  for this short segment stenosis. If this does not provide long-term relief, consideration could be given to a stricturoplasty.     HPI, Physical Exam, Assessment and Plan have been scribed under the direction and in the presence of Hervey Ard, MD.  Gaspar Cola, CMA  I have completed the exam and reviewed the above documentation for accuracy and completeness.  I agree with the above.  Haematologist has been used and any errors in dictation or transcription are unintentional.  Hervey Ard, M.D., F.A.C.S.  The patient is scheduled for an upper endoscopy with dilatation at [facility] on 08/03/16. She is aware to call the day before for her arrival time. The patient is aware of date and instructions.  Documented by Lesly Rubenstein LPN    Robert Bellow 07/31/2016, 9:27 PM

## 2016-08-03 ENCOUNTER — Encounter
Admission: RE | Disposition: A | Discharge status: Home / Self Care | Payer: Self-pay | Source: Ambulatory Visit | Attending: General Surgery

## 2016-08-03 ENCOUNTER — Ambulatory Visit: Payer: Medicare Other | Admitting: Anesthesiology

## 2016-08-03 ENCOUNTER — Ambulatory Visit
Admission: RE | Admit: 2016-08-03 | Discharge: 2016-08-03 | Disposition: A | Discharge status: Home / Self Care | Payer: Medicare Other | Source: Ambulatory Visit | Attending: General Surgery | Admitting: General Surgery

## 2016-08-03 ENCOUNTER — Encounter: Payer: Self-pay | Admitting: Anesthesiology

## 2016-08-03 DIAGNOSIS — K311 Adult hypertrophic pyloric stenosis: Secondary | ICD-10-CM

## 2016-08-03 DIAGNOSIS — K9189 Other postprocedural complications and disorders of digestive system: Secondary | ICD-10-CM | POA: Diagnosis present

## 2016-08-03 DIAGNOSIS — Z85118 Personal history of other malignant neoplasm of bronchus and lung: Secondary | ICD-10-CM | POA: Insufficient documentation

## 2016-08-03 DIAGNOSIS — Z980 Intestinal bypass and anastomosis status: Secondary | ICD-10-CM | POA: Insufficient documentation

## 2016-08-03 DIAGNOSIS — Z8719 Personal history of other diseases of the digestive system: Secondary | ICD-10-CM | POA: Diagnosis not present

## 2016-08-03 DIAGNOSIS — Z8673 Personal history of transient ischemic attack (TIA), and cerebral infarction without residual deficits: Secondary | ICD-10-CM | POA: Diagnosis not present

## 2016-08-03 DIAGNOSIS — Z87891 Personal history of nicotine dependence: Secondary | ICD-10-CM | POA: Insufficient documentation

## 2016-08-03 DIAGNOSIS — Z9049 Acquired absence of other specified parts of digestive tract: Secondary | ICD-10-CM | POA: Insufficient documentation

## 2016-08-03 DIAGNOSIS — Z79899 Other long term (current) drug therapy: Secondary | ICD-10-CM | POA: Insufficient documentation

## 2016-08-03 DIAGNOSIS — Y838 Other surgical procedures as the cause of abnormal reaction of the patient, or of later complication, without mention of misadventure at the time of the procedure: Secondary | ICD-10-CM | POA: Insufficient documentation

## 2016-08-03 HISTORY — PX: ESOPHAGOGASTRODUODENOSCOPY (EGD) WITH PROPOFOL: SHX5813

## 2016-08-03 SURGERY — ESOPHAGOGASTRODUODENOSCOPY (EGD) WITH PROPOFOL
Anesthesia: General

## 2016-08-03 MED ORDER — FENTANYL CITRATE (PF) 100 MCG/2ML IJ SOLN
INTRAMUSCULAR | Status: DC | PRN
  Administered 2016-08-03: 25 ug via INTRAVENOUS

## 2016-08-03 MED ORDER — PROPOFOL 10 MG/ML IV BOLUS
INTRAVENOUS | Status: AC
  Filled 2016-08-03: qty 20

## 2016-08-03 MED ORDER — PROPOFOL 500 MG/50ML IV EMUL
INTRAVENOUS | Status: DC | PRN
  Administered 2016-08-03: 120 ug/kg/min via INTRAVENOUS

## 2016-08-03 MED ORDER — MIDAZOLAM HCL 2 MG/2ML IJ SOLN
INTRAMUSCULAR | Status: DC | PRN
  Administered 2016-08-03: 1 mg via INTRAVENOUS

## 2016-08-03 MED ORDER — GLYCOPYRROLATE 0.2 MG/ML IJ SOLN
INTRAMUSCULAR | Status: DC | PRN
  Administered 2016-08-03: .2 mg via INTRAVENOUS

## 2016-08-03 MED ORDER — FENTANYL CITRATE (PF) 100 MCG/2ML IJ SOLN
INTRAMUSCULAR | Status: AC
  Filled 2016-08-03: qty 2

## 2016-08-03 MED ORDER — MIDAZOLAM HCL 2 MG/2ML IJ SOLN
INTRAMUSCULAR | Status: AC
  Filled 2016-08-03: qty 2

## 2016-08-03 MED ORDER — PROPOFOL 10 MG/ML IV BOLUS
INTRAVENOUS | Status: DC | PRN
  Administered 2016-08-03: 70 mg via INTRAVENOUS
  Administered 2016-08-03: 20 mg via INTRAVENOUS

## 2016-08-03 MED ORDER — SODIUM CHLORIDE 0.9 % IV SOLN
INTRAVENOUS | Status: DC
  Administered 2016-08-03: 1000 mL via INTRAVENOUS

## 2016-08-03 MED ORDER — LIDOCAINE HCL (CARDIAC) 20 MG/ML IV SOLN
INTRAVENOUS | Status: DC | PRN
  Administered 2016-08-03: 40 mg via INTRAVENOUS

## 2016-08-03 NOTE — Transfer of Care (Signed)
Immediate Anesthesia Transfer of Care Note  Patient: ANALESE SOVINE  Procedure(s) Performed: Procedure(s): ESOPHAGOGASTRODUODENOSCOPY (EGD) WITH PROPOFOL (N/A)  Patient Location: PACU  Anesthesia Type:General  Level of Consciousness: awake, alert  and oriented  Airway & Oxygen Therapy: Patient Spontanous Breathing and Patient connected to nasal cannula oxygen  Post-op Assessment: Report given to RN and Post -op Vital signs reviewed and stable  Post vital signs: Reviewed and stable  Last Vitals:  Vitals:   08/03/16 1517 08/03/16 1518  BP: 138/81   Pulse: 94   Resp: 13   Temp: 36.9 C (P) 36.9 C    Last Pain:  Vitals:   08/03/16 1518  TempSrc: (P) Tympanic  PainSc:       Patients Stated Pain Goal: 0 (97/53/00 5110)  Complications: No apparent anesthesia complications

## 2016-08-03 NOTE — Anesthesia Post-op Follow-up Note (Cosign Needed)
Anesthesia QCDR form completed.        

## 2016-08-03 NOTE — Anesthesia Preprocedure Evaluation (Signed)
Anesthesia Evaluation  Patient identified by MRN, date of birth, ID band Patient awake    Reviewed: Allergy & Precautions, NPO status , Patient's Chart, lab work & pertinent test results, reviewed documented beta blocker date and time   Airway Mallampati: II  TM Distance: >3 FB     Dental  (+) Chipped   Pulmonary former smoker,           Cardiovascular      Neuro/Psych CVA    GI/Hepatic   Endo/Other    Renal/GU      Musculoskeletal  (+) Arthritis ,   Abdominal   Peds  Hematology   Anesthesia Other Findings Lung Ca.  Reproductive/Obstetrics                             Anesthesia Physical Anesthesia Plan  ASA: III  Anesthesia Plan: General   Post-op Pain Management:    Induction: Intravenous  PONV Risk Score and Plan:   Airway Management Planned:   Additional Equipment:   Intra-op Plan:   Post-operative Plan:   Informed Consent: I have reviewed the patients History and Physical, chart, labs and discussed the procedure including the risks, benefits and alternatives for the proposed anesthesia with the patient or authorized representative who has indicated his/her understanding and acceptance.     Plan Discussed with: CRNA  Anesthesia Plan Comments:         Anesthesia Quick Evaluation

## 2016-08-03 NOTE — Op Note (Signed)
[facility] Gastroenterology Patient Name: Carol Shaffer Procedure Date: 08/03/2016 2:50 PM MRN: [id number] Account #: [id number] Date of Birth: [birth year] Admit Type: Outpatient Age: 71 Room: [facility] ENDO ROOM 1 Gender: Female Note Status: Finalized Procedure:            Upper GI endoscopy Indications:          Management of operative complication: Dilation of                        anastomotic stricture Providers:            Robert Bellow, MD Referring MD:         Mikeal Hawthorne. Brynda Greathouse MD, MD (Referring MD) Medicines:            Monitored Anesthesia Care Complications:        No immediate complications. Procedure:            Pre-Anesthesia Assessment:                       - Prior to the procedure, a History and Physical was                        performed, and patient medications, allergies and                        sensitivities were reviewed. The patient's tolerance of                        previous anesthesia was reviewed.                       - The risks and benefits of the procedure and the                        sedation options and risks were discussed with the                        patient. All questions were answered and informed                        consent was obtained.                       After obtaining informed consent, the endoscope was                        passed under direct vision. Throughout the procedure,                        the patient's blood pressure, pulse, and oxygen                        saturations were monitored continuously. The Endoscope                        was introduced through the mouth, and advanced to the                        second part of duodenum. The upper GI endoscopy was  accomplished without difficulty. The patient tolerated                        the procedure fairly well. Findings:      The esophagus was normal.      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  A       gastric pouch was found containing food debris. The gastroduodenal       anastomosis was characterized by edema and erythema. This was traversed       after dilation. A TTS dilator was passed through the scope. Dilation       with a 15-16.5-18 mm balloon dilator was performed to 18 mm. The       dilation site was examined and showed moderate improvement in luminal       narrowing.      The examined duodenum was normal. Impression:           - Normal esophagus.                       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 Dilated.                       - Normal examined duodenum.                       - No specimens collected. Recommendation:       - Clear liquid diet today. Procedure Code(s):    --- Professional ---                       [phone], Esophagogastroduodenoscopy, flexible, transoral;                        with dilation of gastric/duodenal stricture(s) (eg,                        balloon, bougie) Diagnosis Code(s):    --- Professional ---                       Z98.0, Intestinal bypass and anastomosis status                       K91.89, Other postprocedural complications and                        disorders of digestive system CPT copyright 2016 American Medical Association. All rights reserved. The codes documented in this report are preliminary and upon coder review may  be revised to meet current compliance requirements. Robert Bellow, MD 08/03/2016 3:17:06 PM This report has been signed electronically. Number of Addenda: 0 Note Initiated On: 08/03/2016 2:50 PM      [facility]

## 2016-08-03 NOTE — Anesthesia Postprocedure Evaluation (Signed)
Anesthesia Post Note  Patient: Carol Shaffer  Procedure(s) Performed: Procedure(s) (LRB): ESOPHAGOGASTRODUODENOSCOPY (EGD) WITH PROPOFOL (N/A)  Patient location during evaluation: PACU Anesthesia Type: General Level of consciousness: awake and alert Pain management: pain level controlled Vital Signs Assessment: post-procedure vital signs reviewed and stable Respiratory status: spontaneous breathing, nonlabored ventilation, respiratory function stable and patient connected to nasal cannula oxygen Cardiovascular status: blood pressure returned to baseline and stable Postop Assessment: no signs of nausea or vomiting Anesthetic complications: no     Last Vitals:  Vitals:   08/03/16 1405  BP: 140/71  Pulse: 83  Resp: 20  Temp: 37 C    Last Pain:  Vitals:   08/03/16 1405  TempSrc: Tympanic  PainSc: 0-No pain                 Vandella Ord S

## 2016-08-04 NOTE — H&P (Signed)
Incomplete improvement in upper GI symptoms status post dilatation to 15 mm.  For repeat EGD and dilatation.  No change in cardiopulmonary exam.

## 2016-08-06 ENCOUNTER — Encounter: Payer: Self-pay | Admitting: General Surgery

## 2016-08-07 ENCOUNTER — Encounter: Payer: Self-pay | Admitting: General Surgery

## 2016-10-29 ENCOUNTER — Telehealth: Payer: Self-pay

## 2016-10-29 NOTE — Telephone Encounter (Signed)
-----   Message from Robert Bellow, MD sent at 10/26/2016  4:21 PM EDT ----- Please give the patient a call and see how she's doing. Thank you

## 2016-10-29 NOTE — Telephone Encounter (Signed)
Patient reports that she is doing very well since the procedure. No problems with swallowing. She will call if she has any further problems.

## 2017-03-04 ENCOUNTER — Encounter: Payer: Self-pay | Admitting: *Deleted

## 2017-03-05 ENCOUNTER — Ambulatory Visit
Admission: RE | Admit: 2017-03-05 | Discharge: 2017-03-05 | Disposition: A | Discharge status: Home / Self Care | Payer: Medicare Other | Source: Ambulatory Visit | Attending: Internal Medicine | Admitting: Internal Medicine

## 2017-03-05 ENCOUNTER — Encounter: Payer: Self-pay | Admitting: *Deleted

## 2017-03-05 ENCOUNTER — Encounter
Admission: RE | Disposition: A | Discharge status: Home / Self Care | Payer: Self-pay | Source: Ambulatory Visit | Attending: Internal Medicine

## 2017-03-05 ENCOUNTER — Ambulatory Visit: Payer: Medicare Other | Admitting: Anesthesiology

## 2017-03-05 DIAGNOSIS — Z85118 Personal history of other malignant neoplasm of bronchus and lung: Secondary | ICD-10-CM | POA: Insufficient documentation

## 2017-03-05 DIAGNOSIS — K640 First degree hemorrhoids: Secondary | ICD-10-CM | POA: Insufficient documentation

## 2017-03-05 DIAGNOSIS — Z79899 Other long term (current) drug therapy: Secondary | ICD-10-CM | POA: Diagnosis not present

## 2017-03-05 DIAGNOSIS — Z980 Intestinal bypass and anastomosis status: Secondary | ICD-10-CM | POA: Diagnosis not present

## 2017-03-05 DIAGNOSIS — H539 Unspecified visual disturbance: Secondary | ICD-10-CM | POA: Insufficient documentation

## 2017-03-05 DIAGNOSIS — J449 Chronic obstructive pulmonary disease, unspecified: Secondary | ICD-10-CM | POA: Insufficient documentation

## 2017-03-05 DIAGNOSIS — I69398 Other sequelae of cerebral infarction: Secondary | ICD-10-CM | POA: Diagnosis not present

## 2017-03-05 DIAGNOSIS — R195 Other fecal abnormalities: Secondary | ICD-10-CM | POA: Insufficient documentation

## 2017-03-05 DIAGNOSIS — K52831 Collagenous colitis: Secondary | ICD-10-CM | POA: Diagnosis not present

## 2017-03-05 DIAGNOSIS — Z8719 Personal history of other diseases of the digestive system: Secondary | ICD-10-CM | POA: Insufficient documentation

## 2017-03-05 DIAGNOSIS — Z87891 Personal history of nicotine dependence: Secondary | ICD-10-CM | POA: Insufficient documentation

## 2017-03-05 DIAGNOSIS — Z8711 Personal history of peptic ulcer disease: Secondary | ICD-10-CM | POA: Insufficient documentation

## 2017-03-05 DIAGNOSIS — R197 Diarrhea, unspecified: Secondary | ICD-10-CM | POA: Diagnosis present

## 2017-03-05 HISTORY — PX: COLONOSCOPY WITH PROPOFOL: SHX5780

## 2017-03-05 HISTORY — DX: Diverticulosis of intestine, part unspecified, without perforation or abscess without bleeding: K57.90

## 2017-03-05 HISTORY — DX: Peritoneal adhesions (postprocedural) (postinfection): K66.0

## 2017-03-05 HISTORY — DX: Chronic obstructive pulmonary disease, unspecified: J44.9

## 2017-03-05 HISTORY — DX: Idiopathic aseptic necrosis of right femur: M87.051

## 2017-03-05 HISTORY — DX: Personal history of other diseases of the digestive system: Z87.19

## 2017-03-05 HISTORY — DX: Noninfective gastroenteritis and colitis, unspecified: K52.9

## 2017-03-05 HISTORY — DX: Blepharospasm: G24.5

## 2017-03-05 SURGERY — COLONOSCOPY WITH PROPOFOL
Anesthesia: General

## 2017-03-05 MED ORDER — MIDAZOLAM HCL 2 MG/2ML IJ SOLN
INTRAMUSCULAR | Status: AC
  Filled 2017-03-05: qty 2

## 2017-03-05 MED ORDER — PROPOFOL 10 MG/ML IV BOLUS
INTRAVENOUS | Status: AC
  Filled 2017-03-05: qty 20

## 2017-03-05 MED ORDER — FENTANYL CITRATE (PF) 100 MCG/2ML IJ SOLN
INTRAMUSCULAR | Status: AC
  Filled 2017-03-05: qty 2

## 2017-03-05 MED ORDER — SODIUM CHLORIDE 0.9 % IV SOLN
INTRAVENOUS | Status: DC
  Administered 2017-03-05: 1000 mL via INTRAVENOUS

## 2017-03-05 MED ORDER — PROPOFOL 500 MG/50ML IV EMUL
INTRAVENOUS | Status: DC | PRN
  Administered 2017-03-05: 75 ug/kg/min via INTRAVENOUS

## 2017-03-05 MED ORDER — MIDAZOLAM HCL 2 MG/2ML IJ SOLN
INTRAMUSCULAR | Status: DC | PRN
  Administered 2017-03-05: 2 mg via INTRAVENOUS

## 2017-03-05 MED ORDER — PROPOFOL 10 MG/ML IV BOLUS
INTRAVENOUS | Status: DC | PRN
  Administered 2017-03-05: 100 mg via INTRAVENOUS

## 2017-03-05 MED ORDER — FENTANYL CITRATE (PF) 100 MCG/2ML IJ SOLN
INTRAMUSCULAR | Status: DC | PRN
  Administered 2017-03-05 (×2): 50 ug via INTRAVENOUS

## 2017-03-05 NOTE — H&P (Signed)
Outpatient short stay form Pre-procedure 03/05/2017 10:34 AM Teodoro K. Alice Reichert, M.D.  Primary Physician: zDr. Kearney Hard  Reason for visit:  Diarrhea, chronic with heme positive stool.  History of present illness:  Patient is a pleasant 72 year old female recently seen in the office on 02/28/2017 for chronic diarrhea with Hemoccult-positive stool on digital rectal examination as well as on occult blood testing of the stool. Patient has up to 4 loose stools daily, sometimes nocturnal. No gross bleeding noted. No weight loss. Lab work including CBC, CMP, CRP, sedimentation rate and celiac antibody serology were all negative except for elevated CRP of 9.4. Patient has a history of peptic ulcer disease status post Billroth I gastroenterostomy.    Current Facility-Administered Medications:  .  0.9 %  sodium chloride infusion, , Intravenous, Continuous, [place], Benay Pike, MD, Last Rate: 20 mL/hr at 03/05/17 1024, 1,000 mL at 03/05/17 1024  Medications Prior to Admission  Medication Sig Dispense Refill Last Dose  . loratadine (CLARITIN) 10 MG tablet Take 10 mg by mouth daily.     . Multiple Vitamin (MULTI-VITAMINS) TABS once daily.   Past Week at Unknown time  . HYDROcodone-acetaminophen (NORCO/VICODIN) 5-325 MG per tablet Take 1 tablet by mouth every 6 (six) hours as needed.    Past Week at Unknown time  . ondansetron (ZOFRAN) 4 MG tablet Take 1 tablet (4 mg total) by mouth every 8 (eight) hours as needed for nausea or vomiting. 10 tablet 0 08/03/2016 at Unknown time  . pantoprazole (PROTONIX) 40 MG tablet Take 40 mg by mouth 2 (two) times daily.    08/03/2016 at Unknown time  . sucralfate (CARAFATE) 1 G tablet Take 1 g by mouth 4 (four) times daily.    08/02/2016 at Unknown time  . traZODone (DESYREL) 150 MG tablet Take 300 mg by mouth at bedtime.    08/03/2016 at Unknown time  . venlafaxine XR (EFFEXOR-XR) 150 MG 24 hr capsule Take 150 mg by mouth daily with breakfast.    08/02/2016 at Unknown time      Allergies  Allergen Reactions  . Bentyl [Dicyclomine] Nausea And Vomiting  . Biaxin [Clarithromycin] Other (See Comments)    hallucinations  . Cortizone-10 [Hydrocortisone] Swelling    injection  . Erythromycin Nausea And Vomiting  . Reglan [Metoclopramide] Other (See Comments)    tremors     Past Medical History:  Diagnosis Date  . Arthritis   . Asthma   . Avascular necrosis of hip, right ([place])   . Blepharospasm   . Cancer ([place]) 2011   lung  . Chronic diarrhea   . COPD (chronic obstructive pulmonary disease) ([place])   . Diverticulosis   . Hemorrhoid   . History of Crohn's disease   . Intestinal adhesions   . Multiple gastric ulcers   . Multiple gastric ulcers   . Stroke [facility])     Review of systems:      Physical Exam  General appearance: alert, cooperative and appears stated age Resp: clear to auscultation bilaterally Cardio: regular rate and rhythm, S1, S2 normal, no murmur, click, rub or gallop GI: soft, non-tender; bowel sounds normal; no masses,  no organomegaly Extremities: extremities normal, atraumatic, no cyanosis or edema     Planned procedures: Proceed with colonoscopy.The patient understands the nature of the planned procedure, indications, risks, alternatives and potential complications including but not limited to bleeding, infection, perforation, damage to internal organs and possible oversedation/side effects from anesthesia. The patient agrees and gives consent to proceed.  Please  refer to procedure notes for findings, recommendations and patient disposition/instructions.    Teodoro K. Alice Reichert, M.D. Gastroenterology 03/05/2017  10:34 AM

## 2017-03-05 NOTE — Discharge Instructions (Addendum)
Colonoscopy, Adult, Care After  This sheet gives you information about how to care for yourself after your procedure. Your health care provider may also give you more specific instructions. If you have problems or questions, contact your health care provider.  What can I expect after the procedure?  After the procedure, it is common to have:  · A small amount of blood in your stool for 24 hours after the procedure.  · Some gas.  · Mild abdominal cramping or bloating.    Follow these instructions at home:  General instructions    · For the first 24 hours after the procedure:  ? Do not drive or use machinery.  ? Do not sign important documents.  ? Do not drink alcohol.  ? Do your regular daily activities at a slower pace than normal.  ? Eat soft, easy-to-digest foods.  ? Rest often.  · Take over-the-counter or prescription medicines only as told by your health care provider.  · It is up to you to get the results of your procedure. Ask your health care provider, or the department performing the procedure, when your results will be ready.  Relieving cramping and bloating  · Try walking around when you have cramps or feel bloated.  · Apply heat to your abdomen as told by your health care provider. Use a heat source that your health care provider recommends, such as a moist heat pack or a heating pad.  ? Place a towel between your skin and the heat source.  ? Leave the heat on for 20-30 minutes.  ? Remove the heat if your skin turns bright red. This is especially important if you are unable to feel pain, heat, or cold. You may have a greater risk of getting burned.  Eating and drinking  · Drink enough fluid to keep your urine clear or pale yellow.  · Resume your normal diet as instructed by your health care provider. Avoid heavy or fried foods that are hard to digest.  · Avoid drinking alcohol for as long as instructed by your health care provider.  Contact a health care provider if:  · You have blood in your stool 2-3  days after the procedure.  Get help right away if:  · You have more than a small spotting of blood in your stool.  · You pass large blood clots in your stool.  · Your abdomen is swollen.  · You have nausea or vomiting.  · You have a fever.  · You have increasing abdominal pain that is not relieved with medicine.  This information is not intended to replace advice given to you by your health care provider. Make sure you discuss any questions you have with your health care provider.  Document Released: 08/30/2003 Document Revised: 10/10/2015 Document Reviewed: 03/29/2015  Elsevier Interactive Patient Education © 2018 [employer]

## 2017-03-05 NOTE — Anesthesia Postprocedure Evaluation (Signed)
Anesthesia Post Note  Patient: Carol Shaffer  Procedure(s) Performed: COLONOSCOPY WITH PROPOFOL (N/A )  Anesthesia Type: General     Last Vitals:  Vitals:   03/05/17 0955 03/05/17 1120  BP: (!) 110/92 100/67  Pulse: (!) 105 99  Resp: 17 15  Temp: (!) 36 C 36.6 C  SpO2: 96% 97%    Last Pain:  Vitals:   03/05/17 0955  TempSrc: Tympanic                 Philbert Riser

## 2017-03-05 NOTE — Op Note (Signed)
[facility] Patient Name: Carol Shaffer Procedure Date: 03/05/2017 10:46 AM MRN: [id number] Account #: [id number] Date of Birth: 10-11-1945 Admit Type: Outpatient Age: 72 Room: [facility] ENDO ROOM 1 Gender: Female Note Status: Finalized Procedure:            Colonoscopy Indications:          Chronic diarrhea, Heme positive stool Providers:            Benay Pike. Alice Reichert MD, MD Referring MD:         Leona Carry. Hall Busing, MD (Referring MD) Medicines:            Propofol per Anesthesia Complications:        No immediate complications. Procedure:            Pre-Anesthesia Assessment:                       - The risks and benefits of the procedure and the                        sedation options and risks were discussed with the                        patient. All questions were answered and informed                        consent was obtained.                       - Patient identification and proposed procedure were                        verified prior to the procedure by the nurse. The                        procedure was verified in the procedure room.                       - ASA Grade Assessment: III - A patient with severe                        systemic disease.                       - After reviewing the risks and benefits, the patient                        was deemed in satisfactory condition to undergo the                        procedure.                       After obtaining informed consent, the colonoscope was                        passed under direct vision. Throughout the procedure,                        the patient's blood pressure, pulse, and oxygen  saturations were monitored continuously. The                        Colonoscope was introduced through the anus and                        advanced to the the cecum, identified by appendiceal                        orifice and ileocecal valve. The colonoscopy was           somewhat difficult due to post-surgical anatomy.                        Successful completion of the procedure was aided by                        using manual pressure. The patient tolerated the                        procedure well. The quality of the bowel preparation                        was good. The ileocecal valve, appendiceal orifice, and                        rectum were photographed. Findings:      The perianal and digital rectal examinations were normal. Pertinent       negatives include normal sphincter tone and no palpable rectal lesions.      There was evidence of a prior end-to-end colo-rectal anastomosis in the       recto-sigmoid colon. This was patent and was characterized by healthy       appearing mucosa. The anastomosis was traversed.      Non-bleeding internal hemorrhoids were found during retroflexion. The       hemorrhoids were Grade I (internal hemorrhoids that do not prolapse).      The exam was otherwise without abnormality on direct and retroflexion       views. Impression:           - Patent end-to-end colo-rectal anastomosis,                        characterized by healthy appearing mucosa.                       - Non-bleeding internal hemorrhoids.                       - The examination was otherwise normal on direct and                        retroflexion views.                       - No specimens collected. Recommendation:       - Patient has a contact number available for                        emergencies. The signs and symptoms of potential  delayed complications were discussed with the patient.                        Return to normal activities tomorrow. Written discharge                        instructions were provided to the patient.                       - Resume previous diet.                       - Continue present medications.                       - Await pathology results.                       - Repeat  colonoscopy in 10 years for screening purposes.                       - Return to nurse practitioner in 3 months.                       - Telephone GI office for pathology results in 2 weeks.                       - The findings and recommendations were discussed with                        the patient. Procedure Code(s):    --- Professional ---                       [phone], Colonoscopy, flexible; diagnostic, including                        collection of specimen(s) by brushing or washing, when                        performed (separate procedure) Diagnosis Code(s):    --- Professional ---                       Z98.0, Intestinal bypass and anastomosis status                       K64.0, First degree hemorrhoids                       K52.9, Noninfective gastroenteritis and colitis,                        unspecified                       R19.5, Other fecal abnormalities CPT copyright 2016 American Medical Association. All rights reserved. The codes documented in this report are preliminary and upon coder review may  be revised to meet current compliance requirements. Efrain Sella MD, MD 03/05/2017 11:19:09 AM This report has been signed electronically. Number of Addenda: 0 Note Initiated On: 03/05/2017 10:46 AM Scope Withdrawal Time: 0 hours 6 minutes 31 seconds  Total Procedure Duration: 0 hours 15 minutes 39 seconds  [facility]

## 2017-03-05 NOTE — Anesthesia Post-op Follow-up Note (Signed)
Anesthesia QCDR form completed.        

## 2017-03-05 NOTE — Anesthesia Preprocedure Evaluation (Signed)
Anesthesia Evaluation  Patient identified by MRN, date of birth, ID band Patient awake    Reviewed: Allergy & Precautions, NPO status , Patient's Chart, lab work & pertinent test results  History of Anesthesia Complications Negative for: history of anesthetic complications  Airway Mallampati: II  TM Distance: >3 FB Neck ROM: Full    Dental no notable dental hx.    Pulmonary neg sleep apnea, neg COPD, former smoker,    breath sounds clear to auscultation- rhonchi (-) wheezing      Cardiovascular Exercise Tolerance: Good (-) hypertension(-) CAD, (-) Past MI, (-) Cardiac Stents and (-) CABG  Rhythm:Regular Rate:Normal - Systolic murmurs and - Diastolic murmurs    Neuro/Psych CVA (L eye visual problems), Residual Symptoms negative psych ROS   GI/Hepatic Neg liver ROS, PUD,   Endo/Other  negative endocrine ROSneg diabetes  Renal/GU negative Renal ROS     Musculoskeletal  (+) Arthritis ,   Abdominal (+) - obese,   Peds  Hematology negative hematology ROS (+)   Anesthesia Other Findings Past Medical History: No date: Arthritis No date: Asthma No date: Avascular necrosis of hip, right (HCC) No date: Blepharospasm 2011: Cancer ([place])     Comment:  lung No date: Chronic diarrhea No date: COPD (chronic obstructive pulmonary disease) (HCC) No date: Diverticulosis No date: Hemorrhoid No date: History of Crohn's disease No date: Intestinal adhesions No date: Multiple gastric ulcers No date: Multiple gastric ulcers No date: Stroke [facility])   Reproductive/Obstetrics                             Anesthesia Physical Anesthesia Plan  ASA: III  Anesthesia Plan: General   Post-op Pain Management:    Induction: Intravenous  PONV Risk Score and Plan: 2 and Propofol infusion  Airway Management Planned: Natural Airway  Additional Equipment:   Intra-op Plan:   Post-operative Plan:   Informed  Consent: I have reviewed the patients History and Physical, chart, labs and discussed the procedure including the risks, benefits and alternatives for the proposed anesthesia with the patient or authorized representative who has indicated his/her understanding and acceptance.   Dental advisory given  Plan Discussed with: CRNA and Anesthesiologist  Anesthesia Plan Comments:         Anesthesia Quick Evaluation

## 2017-03-05 NOTE — Transfer of Care (Signed)
Immediate Anesthesia Transfer of Care Note  Patient: Carol Shaffer  Procedure(s) Performed: COLONOSCOPY WITH PROPOFOL (N/A )  Patient Location: PACU      Anesthesia Type:General  Level of Consciousness: awake  Airway & Oxygen Therapy: Patient Spontanous Breathing  Post-op Assessment: Report given to RN  Post vital signs: Reviewed and stable  Last Vitals:  Vitals:   03/05/17 0955 03/05/17 1120  BP: (!) 110/92 100/67  Pulse: (!) 105 99  Resp: 17 15  Temp: (!) 36 C 36.6 C  SpO2: 96% 97%    Last Pain:  Vitals:   03/05/17 0955  TempSrc: Tympanic         Complications: No apparent anesthesia complications

## 2017-03-06 LAB — SURGICAL PATHOLOGY

## 2017-05-07 DIAGNOSIS — R768 Other specified abnormal immunological findings in serum: Secondary | ICD-10-CM | POA: Insufficient documentation

## 2017-05-07 DIAGNOSIS — K52831 Collagenous colitis: Secondary | ICD-10-CM | POA: Insufficient documentation

## 2017-05-20 ENCOUNTER — Inpatient Hospital Stay: Payer: Medicare Other | Attending: Oncology | Admitting: Oncology

## 2017-05-20 ENCOUNTER — Encounter: Payer: Self-pay | Admitting: Oncology

## 2017-05-20 ENCOUNTER — Inpatient Hospital Stay: Payer: Medicare Other

## 2017-05-20 VITALS — BP 127/84 | HR 88 | Temp 97.4°F | Resp 18 | Ht <= 58 in | Wt 88.3 lb

## 2017-05-20 DIAGNOSIS — D509 Iron deficiency anemia, unspecified: Secondary | ICD-10-CM | POA: Insufficient documentation

## 2017-05-20 DIAGNOSIS — D801 Nonfamilial hypogammaglobulinemia: Secondary | ICD-10-CM | POA: Diagnosis not present

## 2017-05-20 DIAGNOSIS — Z934 Other artificial openings of gastrointestinal tract status: Secondary | ICD-10-CM

## 2017-05-21 NOTE — Progress Notes (Signed)
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  She also has a history of peptic ulcer disease and is status post gastrojejunostomy in 16s.  She was also found to have small bowel obstruction and colonic perforation in the past status post resection colostomy and subsequently she has had adhesions in her abdomen.  She does have chronic pain secondary to this and is on pain medications.  She sees GI for collagenous colitis and is currently on budesonide.  She has had SPEP checked since January 2019 which showed that her IgG levels were low in the 300s.  Most recent IgG level on 05/07/2017 was 319 with a low IgA of 76.  IgM was normal at 72.  There was no M protein seen.  Recent CBC from 05/07/2017 also showed an H&H of 11.9/37.7 and her hemoglobin in the past has typically been ranging between 11-12.  Ferritin was low at 9.  Iron saturation 18% and TIBC normal at 383.5  She has been referred to [place] for hypoalbuminemia.  Presently patient reports feeling well other than her occasional abdominal pain due to adhesions.  She denies any signs and symptoms of recurrent infections or hospitalizations.  Even during her childhood she did not have any repeated infections.  She did get her childhood vaccinations but reports that she had chickenpox and measles.  Her appetite is good and she denies any unintentional weight loss or drenching night sweats.  Her weight has  been stable.  She has been getting surveillance chest x-rays since it has been more than 5 years since her lung cancer was diagnosed and treated.  She has not been getting her mammograms.  ECOG PS- 0  Pain scale- 4  Review of systems- Review of Systems  Constitutional: Negative for chills, fever, malaise/fatigue and weight loss.  HENT: Negative for congestion, ear discharge and nosebleeds.   Eyes: Negative for blurred vision.  Respiratory: Negative for cough, hemoptysis, sputum production, shortness of breath and wheezing.   Cardiovascular: Negative for chest pain, palpitations, orthopnea and claudication.  Gastrointestinal: Positive for abdominal pain. Negative for blood in stool, constipation, diarrhea, heartburn, melena, nausea and vomiting.  Genitourinary: Negative for dysuria, flank pain, frequency, hematuria and urgency.  Musculoskeletal: Negative for back pain, joint pain and myalgias.  Skin: Negative for rash.  Neurological: Negative for dizziness, tingling, focal weakness, seizures, weakness and headaches.  Endo/Heme/Allergies: Does not bruise/bleed easily.  Psychiatric/Behavioral: Negative for depression and suicidal ideas. The patient does not have insomnia.     Allergies  Allergen Reactions  . Hydromorphone Hcl Itching  . Bentyl [Dicyclomine] Nausea And Vomiting  . Biaxin [Clarithromycin] Other (See Comments)    hallucinations  . Erythromycin Nausea And Vomiting  . Reglan [Metoclopramide] Other (See Comments)    tremors    Patient Active Problem List   Diagnosis Date Noted  . Nausea and vomiting 07/03/2016  . Gastric outlet obstruction 11/11/2013     Past Medical History:  Diagnosis Date  . Arthritis   .  Avascular necrosis of hip, right ([place])   . Blepharospasm   . Cancer ([place]) 2011   lung  . Chronic diarrhea   . COPD (chronic obstructive pulmonary disease) ([place])   . Diverticulosis   . Hemorrhoid   . History of Crohn's disease   . Intestinal adhesions     . Multiple gastric ulcers   . Multiple gastric ulcers   . Stroke [facility])      Past Surgical History:  Procedure Laterality Date  . APPENDECTOMY  1989  . botox injections for blepharospasm    . BREAST SURGERY    . CATARACT EXTRACTION    . COLON RESECTION  2005  . COLONOSCOPY  10-29-2008   [name]  . COLONOSCOPY WITH PROPOFOL N/A 03/05/2017   Procedure: COLONOSCOPY WITH PROPOFOL;  Surgeon: [place], Benay Pike, MD;  Location: [facility] ENDOSCOPY;  Service: Gastroenterology;  Laterality: N/A;  . COLOSTOMY REVERSAL  2006  . ESOPHAGOGASTRODUODENOSCOPY (EGD) WITH PROPOFOL N/A 07/18/2016   Procedure: ESOPHAGOGASTRODUODENOSCOPY (EGD) WITH PROPOFOL;  Surgeon: Robert Bellow, MD;  Location: [facility] ENDOSCOPY;  Service: Endoscopy;  Laterality: N/A;  . ESOPHAGOGASTRODUODENOSCOPY (EGD) WITH PROPOFOL N/A 08/03/2016   Procedure: ESOPHAGOGASTRODUODENOSCOPY (EGD) WITH PROPOFOL;  Surgeon: Robert Bellow, MD;  Location: [facility] ENDOSCOPY;  Service: Endoscopy;  Laterality: N/A;  . ESOPHAGOSCOPY WITH DILITATION  2012,2015   [facility], [place]  . EYE SURGERY    . gastric ulcer  1989, 1991  . JOINT REPLACEMENT     right total hip arthroplasty 03/03/02  . LUNG CANCER SURGERY  2011  . LYSIS OF ADHESION    . PLACEMENT OF BREAST IMPLANTS  1973  . UPPER GASTROINTESTINAL ENDOSCOPY  10-29-2008   [name]    Social History   Socioeconomic History  . Marital status: Widowed    Spouse name: Not on file  . Number of children: Not on file  . Years of education: Not on file  . Highest education level: Not on file  Occupational History  . Not on file  Social Needs  . Financial resource strain: Not on file  . Food insecurity:    Worry: Not on file    Inability: Not on file  . Transportation needs:    Medical: Not on file    Non-medical: Not on file  Tobacco Use  . Smoking status: Former Smoker    Years: 25.00    Last attempt to quit: 01/30/2008    Years since quitting: 9.3  . Smokeless tobacco: Never Used   Substance and Sexual Activity  . Alcohol use: Yes    Alcohol/week: 4.2 oz    Types: 7 Glasses of wine per week    Comment: wine  . Drug use: No  . Sexual activity: Not on file  Lifestyle  . Physical activity:    Days per week: Not on file    Minutes per session: Not on file  . Stress: Not on file  Relationships  . Social connections:    Talks on phone: Not on file    Gets together: Not on file    Attends religious service: Not on file    Active member of club or organization: Not on file    Attends meetings of clubs or organizations: Not on file    Relationship status: Not on file  . Intimate partner violence:    Fear of current or ex partner: Not on file    Emotionally abused: Not on file    Physically abused: Not on file  Forced sexual activity: Not on file  Other Topics Concern  . Not on file  Social History Narrative  . Not on file     History reviewed. No pertinent family history.   Current Outpatient Medications:  .  HYDROcodone-acetaminophen (NORCO/VICODIN) 5-325 MG per tablet, Take 1 tablet by mouth every 6 (six) hours as needed. , Disp: , Rfl:  .  loratadine (CLARITIN) 10 MG tablet, Take 10 mg by mouth daily., Disp: , Rfl:  .  Multiple Vitamin (MULTI-VITAMINS) TABS, once daily., Disp: , Rfl:  .  pantoprazole (PROTONIX) 40 MG tablet, Take 40 mg by mouth 2 (two) times daily. , Disp: , Rfl:  .  sucralfate (CARAFATE) 1 G tablet, Take 1 g by mouth 4 (four) times daily. , Disp: , Rfl:  .  traZODone (DESYREL) 150 MG tablet, Take 300 mg by mouth at bedtime. , Disp: , Rfl:  .  venlafaxine XR (EFFEXOR-XR) 150 MG 24 hr capsule, Take 150 mg by mouth daily with breakfast. , Disp: , Rfl:  .  ondansetron (ZOFRAN) 4 MG tablet, Take 1 tablet (4 mg total) by mouth every 8 (eight) hours as needed for nausea or vomiting. (Patient not taking: Reported on 05/20/2017), Disp: 10 tablet, Rfl: 0   Physical exam:  Vitals:   05/20/17 1107  BP: 127/84  Pulse: 88  Resp: 18  Temp: (!)  97.4 F (36.3 C)  TempSrc: Tympanic  SpO2: 96%  Weight: 88 lb 4.7 oz (40.1 kg)  Height: 4' 9"  (1.448 m)   Physical Exam  Constitutional: She is oriented to person, place, and time. She appears well-developed and well-nourished.  HENT:  Head: Normocephalic and atraumatic.  Eyes: Pupils are equal, round, and reactive to light. EOM are normal.  Neck: Normal range of motion.  Cardiovascular: Normal rate, regular rhythm and normal heart sounds.  Pulmonary/Chest: Effort normal and breath sounds normal.  Abdominal: Soft. Bowel sounds are normal.  Midline surgical scar of prior surgery is seen  Neurological: She is alert and oriented to person, place, and time.  Skin: Skin is warm and dry.       CMP Latest Ref Rng & Units 12/24/2011  Creatinine 0.60 - 1.30 mg/dL 0.88    Assessment and plan- Patient is a 72 y.o. 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  I can certainly refer her to [facility] immunology for this.  Given her age and lack of significant recurrent infections through her childhood and adulthood, I doubt that patient has primary immunodeficiency disorder such as common variable immunodeficiency.  She does not take any medications or has not received any recent medications in the past that would cause secondary immunodeficiencies.  Clinically on exam as well as per history there is no concern for ongoing malignancy which can lead to secondary immunodeficiency.  Despite a low IgG level patient is relatively asymptomatic and does not have any signs of recurrent infections presently.  I therefore do not think the patient requires any IVIG at this time.   Patient was found to have mild anemia with  hb of 11.9 with a ferritin of 9 on 05/07/17. I have advised her to take oral iron 325 mg once or twice every other day. She has has gastroduodenostomy in the  past. I will see her back in 2 months with cbc ferritin and iron studies. Consider IV iron if levels are still low  Patient is understanding of the above plan. She does not wish to be referred to [facility] immunology at this time  Thank you for this kind referral and the opportunity to participate in the care of this patient   Visit Diagnosis 1. 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 Randa Evens, MD, MPH [facility] at [facility] [phone] 05/23/2017 9:23 AM

## 2017-05-23 ENCOUNTER — Encounter: Payer: Self-pay | Admitting: Oncology

## 2017-06-13 ENCOUNTER — Encounter: Payer: Self-pay | Admitting: Nurse Practitioner

## 2017-07-15 ENCOUNTER — Inpatient Hospital Stay: Payer: Medicare Other

## 2017-07-15 ENCOUNTER — Inpatient Hospital Stay: Payer: Medicare Other | Attending: Oncology | Admitting: Oncology

## 2017-07-15 ENCOUNTER — Encounter: Payer: Self-pay | Admitting: Oncology

## 2017-07-15 VITALS — BP 141/82 | HR 87 | Temp 98.0°F | Resp 18 | Ht <= 58 in | Wt 86.9 lb

## 2017-07-15 DIAGNOSIS — Z85118 Personal history of other malignant neoplasm of bronchus and lung: Secondary | ICD-10-CM | POA: Insufficient documentation

## 2017-07-15 DIAGNOSIS — D509 Iron deficiency anemia, unspecified: Secondary | ICD-10-CM | POA: Diagnosis present

## 2017-07-15 DIAGNOSIS — D801 Nonfamilial hypogammaglobulinemia: Secondary | ICD-10-CM | POA: Insufficient documentation

## 2017-07-15 DIAGNOSIS — Z87891 Personal history of nicotine dependence: Secondary | ICD-10-CM | POA: Insufficient documentation

## 2017-07-15 LAB — CBC
HEMATOCRIT: 37.5 % (ref 35.0–47.0)
Hemoglobin: 12.2 g/dL (ref 12.0–16.0)
MCH: 30.4 pg (ref 26.0–34.0)
MCHC: 32.6 g/dL (ref 32.0–36.0)
MCV: 93.4 fL (ref 80.0–100.0)
Platelets: 395 10*3/uL (ref 150–440)
RBC: 4.02 MIL/uL (ref 3.80–5.20)
RDW: 15.9 % — ABNORMAL HIGH (ref 11.5–14.5)
WBC: 6.5 10*3/uL (ref 3.6–11.0)

## 2017-07-15 LAB — FERRITIN: Ferritin: 14 ng/mL (ref 11–307)

## 2017-07-15 LAB — IRON AND TIBC
IRON: 64 ug/dL (ref 28–170)
Saturation Ratios: 21 % (ref 10.4–31.8)
TIBC: 292 ug/dL (ref 250–450)
UIBC: 231 ug/dL

## 2017-07-15 NOTE — Progress Notes (Signed)
No new changes noted today    Patient states he always have pain , nothing new

## 2017-07-15 NOTE — Progress Notes (Signed)
Hematology/Oncology Consult note [facility]  [phone] [phone]  Patient Care Team: Albina Billet, MD as PCP - General (Internal Medicine) Marden Noble, MD (Internal Medicine) Bary Castilla Forest Gleason, MD (General Surgery)   Name of the patient: Carol Shaffer  [id number]  1945-03-23   Date of visit: 07/15/17  Diagnosis- 1. Hypogammaglobulinemia  2. H/o iron deficiency  Chief complaint/ Reason for visit- f/u of iron deficiency  Heme/Onc history: patient is a 72 year old female with a prior history of lung cancer about 7 years ago status post right upper lobe resection.  She also has a history of peptic ulcer disease and is status post gastrojejunostomy in 70s.  She was also found to have small bowel obstruction and colonic perforation in the past status post resection colostomy and subsequently she has had adhesions in her abdomen.  She does have chronic pain secondary to this and is on pain medications.  She sees GI for collagenous colitis and is currently on budesonide.  She has had SPEP checked since January 2019 which showed that her IgG levels were low in the 300s.  Most recent IgG level on 05/07/2017 was 319 with a low IgA of 76.  IgM was normal at 72.  There was no M protein seen.  Recent CBC from 05/07/2017 also showed an H&H of 11.9/37.7 and her hemoglobin in the past has typically been ranging between 11-12.  Ferritin was low at 9.  Iron saturation 18% and TIBC normal at 383.5  She has been referred to [place] for hypoalbuminemia.  Presently patient reports feeling well other than her occasional abdominal pain due to adhesions.  She denies any signs and symptoms of recurrent infections or hospitalizations.  Even during her childhood she did not have any repeated infections.  She did get her childhood vaccinations but reports that she had chickenpox and measles.  Her appetite is good and she denies any unintentional weight loss or drenching  night sweats.  Her weight has been stable.  She has been getting surveillance chest x-rays since it has been more than 5 years since her lung cancer was diagnosed and treated.  She has not been getting her mammograms.   Interval history- she is currently taking oral iron pill every other day and her stools have been black because of that. Denies any infections   ECOG PS- 1 Pain scale- 0   Review of systems- Review of Systems  Constitutional: Negative for chills, fever, malaise/fatigue and weight loss.  HENT: Negative for congestion, ear discharge and nosebleeds.   Eyes: Negative for blurred vision.  Respiratory: Negative for cough, hemoptysis, sputum production, shortness of breath and wheezing.   Cardiovascular: Negative for chest pain, palpitations, orthopnea and claudication.  Gastrointestinal: Negative for abdominal pain, blood in stool, constipation, diarrhea, heartburn, melena, nausea and vomiting.  Genitourinary: Negative for dysuria, flank pain, frequency, hematuria and urgency.  Musculoskeletal: Negative for back pain, joint pain and myalgias.  Skin: Negative for rash.  Neurological: Negative for dizziness, tingling, focal weakness, seizures, weakness and headaches.  Endo/Heme/Allergies: Does not bruise/bleed easily.  Psychiatric/Behavioral: Negative for depression and suicidal ideas. The patient does not have insomnia.      Allergies  Allergen Reactions  . Hydromorphone Hcl Itching  . Bentyl [Dicyclomine] Nausea And Vomiting  . Biaxin [Clarithromycin] Other (See Comments)    hallucinations  . Erythromycin Nausea And Vomiting  . Reglan [Metoclopramide] Other (See Comments)    tremors     Past Medical  History:  Diagnosis Date  . Arthritis   . Avascular necrosis of hip, right ([place])   . Blepharospasm   . Cancer ([place]) 2011   lung  . Chronic diarrhea   . COPD (chronic obstructive pulmonary disease) ([place])   . Diverticulosis   . Hemorrhoid   . History of Crohn's  disease   . Intestinal adhesions   . Multiple gastric ulcers   . Multiple gastric ulcers   . Stroke [facility])      Past Surgical History:  Procedure Laterality Date  . APPENDECTOMY  1989  . botox injections for blepharospasm    . BREAST SURGERY    . CATARACT EXTRACTION    . COLON RESECTION  2005  . COLONOSCOPY  10-29-2008   [name]  . COLONOSCOPY WITH PROPOFOL N/A 03/05/2017   Procedure: COLONOSCOPY WITH PROPOFOL;  Surgeon: Toledo, Benay Pike, MD;  Location: [facility] ENDOSCOPY;  Service: Gastroenterology;  Laterality: N/A;  . COLOSTOMY REVERSAL  2006  . ESOPHAGOGASTRODUODENOSCOPY (EGD) WITH PROPOFOL N/A 07/18/2016   Procedure: ESOPHAGOGASTRODUODENOSCOPY (EGD) WITH PROPOFOL;  Surgeon: Robert Bellow, MD;  Location: [facility] ENDOSCOPY;  Service: Endoscopy;  Laterality: N/A;  . ESOPHAGOGASTRODUODENOSCOPY (EGD) WITH PROPOFOL N/A 08/03/2016   Procedure: ESOPHAGOGASTRODUODENOSCOPY (EGD) WITH PROPOFOL;  Surgeon: Robert Bellow, MD;  Location: [facility] ENDOSCOPY;  Service: Endoscopy;  Laterality: N/A;  . ESOPHAGOSCOPY WITH DILITATION  2012,2015   [facility], Byrnett  . EYE SURGERY    . gastric ulcer  1989, 1991  . JOINT REPLACEMENT     right total hip arthroplasty 03/03/02  . LUNG CANCER SURGERY  2011  . LYSIS OF ADHESION    . PLACEMENT OF BREAST IMPLANTS  1973  . UPPER GASTROINTESTINAL ENDOSCOPY  10-29-2008   [name]    Social History   Socioeconomic History  . Marital status: Widowed    Spouse name: Not on file  . Number of children: Not on file  . Years of education: Not on file  . Highest education level: Not on file  Occupational History  . Not on file  Social Needs  . Financial resource strain: Not on file  . Food insecurity:    Worry: Not on file    Inability: Not on file  . Transportation needs:    Medical: Not on file    Non-medical: Not on file  Tobacco Use  . Smoking status: Former Smoker    Years: 25.00    Last attempt to quit: 01/30/2008    Years since quitting: 9.4  .  Smokeless tobacco: Never Used  Substance and Sexual Activity  . Alcohol use: Yes    Alcohol/week: 4.2 oz    Types: 7 Glasses of wine per week    Comment: wine  . Drug use: No  . Sexual activity: Not on file  Lifestyle  . Physical activity:    Days per week: Not on file    Minutes per session: Not on file  . Stress: Not on file  Relationships  . Social connections:    Talks on phone: Not on file    Gets together: Not on file    Attends religious service: Not on file    Active member of club or organization: Not on file    Attends meetings of clubs or organizations: Not on file    Relationship status: Not on file  . Intimate partner violence:    Fear of current or ex partner: Not on file    Emotionally abused: Not on file  Physically abused: Not on file    Forced sexual activity: Not on file  Other Topics Concern  . Not on file  Social History Narrative  . Not on file    History reviewed. No pertinent family history.   Current Outpatient Medications:  .  loratadine (CLARITIN) 10 MG tablet, Take 10 mg by mouth daily., Disp: , Rfl:  .  Multiple Vitamin (MULTI-VITAMINS) TABS, once daily., Disp: , Rfl:  .  pantoprazole (PROTONIX) 40 MG tablet, Take 40 mg by mouth 2 (two) times daily. , Disp: , Rfl:  .  sucralfate (CARAFATE) 1 G tablet, Take 1 g by mouth 4 (four) times daily. , Disp: , Rfl:  .  traZODone (DESYREL) 150 MG tablet, Take 300 mg by mouth at bedtime. , Disp: , Rfl:  .  venlafaxine XR (EFFEXOR-XR) 150 MG 24 hr capsule, Take 150 mg by mouth daily with breakfast. , Disp: , Rfl:  .  HYDROcodone-acetaminophen (NORCO/VICODIN) 5-325 MG per tablet, Take 1 tablet by mouth every 6 (six) hours as needed. , Disp: , Rfl:  .  ondansetron (ZOFRAN) 4 MG tablet, Take 1 tablet (4 mg total) by mouth every 8 (eight) hours as needed for nausea or vomiting. (Patient not taking: Reported on 05/20/2017), Disp: 10 tablet, Rfl: 0  Physical exam:  Vitals:   07/15/17 0940  BP: (!) 141/82    Pulse: 87  Resp: 18  Temp: 98 F (36.7 C)  TempSrc: Tympanic  SpO2: 99%  Weight: 86 lb 13.8 oz (39.4 kg)  Height: 4\' 9"  (1.448 m)   Physical Exam  Constitutional: She is oriented to person, place, and time.  Patient is thin. Does not appear in any acute distress  HENT:  Head: Normocephalic and atraumatic.  Eyes: Pupils are equal, round, and reactive to light. EOM are normal.  Neck: Normal range of motion.  Cardiovascular: Normal rate, regular rhythm and normal heart sounds.  Pulmonary/Chest: Effort normal and breath sounds normal.  Abdominal: Soft. Bowel sounds are normal.  Neurological: She is alert and oriented to person, place, and time.  Skin: Skin is warm and dry.     CMP Latest Ref Rng & Units 12/24/2011  Creatinine 0.60 - 1.30 mg/dL 0.88   CBC Latest Ref Rng & Units 07/15/2017  WBC 3.6 - 11.0 K/uL 6.5  Hemoglobin 12.0 - 16.0 g/dL 12.2  Hematocrit 35.0 - 47.0 % 37.5  Platelets 150 - 440 K/uL 395     Assessment and plan- Patient is a 72 y.o. female with following issues:  1. Hypogammaglobulinemia: does not require hematology follow up. She can be referred if need be to immunology at [facility]. No role for prophylactic IVIG  2. H/o iron deficiency: iron studies from today are pending. We will call ehr if they are low despite taking oral iron in which case she will nheed IV iron  I will see her back in 4 months with cbc, ferritin and iron studies   Visit Diagnosis 1. Iron deficiency anemia, unspecified iron deficiency anemia type      Dr. Randa Evens, MD, MPH [facility] at [facility] [phone] 07/15/2017 12:55 PM

## 2017-07-17 ENCOUNTER — Telehealth: Payer: Self-pay | Admitting: *Deleted

## 2017-07-17 NOTE — Telephone Encounter (Signed)
Called pt and got voicemail that ferritin still low even though she has been taking oral iron. Dr. Janese Banks would like to offer IV iron after she gets back from cruise or cont. The oral iron and wait til next visit to check the level. She can choose wither option and I asked her to call me back with answer.

## 2017-07-17 NOTE — Telephone Encounter (Signed)
-----   Message from Sindy Guadeloupe, MD sent at 07/16/2017  8:32 AM EDT ----- She is not anemic but ferritin is low despite being on oral iron for 2 months. We could give her feraheme after she gets back from her cruise if she is agreeable or wait until next appointment to see if they improve with PO iron. Thanks, Astrid Divine

## 2017-07-25 ENCOUNTER — Telehealth: Payer: Self-pay | Admitting: *Deleted

## 2017-07-25 NOTE — Telephone Encounter (Signed)
-----   Message from Archana C Rao, MD sent at 07/16/2017  8:32 AM EDT ----- She is not anemic but ferritin is low despite being on oral iron for 2 months. We could give her feraheme after she gets back from her cruise if she is agreeable or wait until next appointment to see if they improve with PO iron. Thanks, Archana 

## 2017-07-25 NOTE — Telephone Encounter (Signed)
Called pt's home and left message.  I had already called her before and I think she is probably on vacation ( cruise) . I left her the message that ferritin is low despite being on oral iron and Dr. Janese Banks suggested to have IV feraheme if she wants it.  If not she can keep the already scheduled  appts  That she has and stay on oral iron. I asked her to call back and let our clinical team know and I left her my direct number.

## 2017-08-07 ENCOUNTER — Telehealth: Payer: Self-pay

## 2017-08-07 NOTE — Telephone Encounter (Signed)
I have spoke with this patient and she has agreed to get feraheme x 2 / will send message to scheduling to get this patient schedule. The patient was agreeable and understanding.

## 2017-08-07 NOTE — Telephone Encounter (Signed)
-----   Message from Luella Cook, RN sent at 07/25/2017  4:42 PM EDT ----- Loma Sousa, This is a patient that I have called twice and left message below of what Dr. Janese Banks had told me.  In the message from Janese Banks she could be on a cruise but I called her again today and left her a message. Can you call her end of next week if she does not call us back.  Thanks Judeen Hammans ----- Message ----- From: Sindy Guadeloupe, MD Sent: 07/16/2017   8:32 AM To: Luella Cook, RN  She is not anemic but ferritin is low despite being on oral iron for 2 months. We could give her feraheme after she gets back from her cruise if she is agreeable or wait until next appointment to see if they improve with PO iron. Thanks, Astrid Divine

## 2017-08-08 ENCOUNTER — Other Ambulatory Visit: Payer: Self-pay | Admitting: Oncology

## 2017-08-08 DIAGNOSIS — D509 Iron deficiency anemia, unspecified: Secondary | ICD-10-CM | POA: Insufficient documentation

## 2017-08-12 ENCOUNTER — Inpatient Hospital Stay: Payer: Medicare Other | Attending: Oncology

## 2017-08-12 VITALS — BP 124/73 | HR 83 | Temp 97.1°F | Resp 20

## 2017-08-12 DIAGNOSIS — D509 Iron deficiency anemia, unspecified: Secondary | ICD-10-CM | POA: Diagnosis present

## 2017-08-12 MED ORDER — SODIUM CHLORIDE 0.9 % IV SOLN
Freq: Once | INTRAVENOUS | Status: AC
Start: 2017-08-12 — End: 2017-08-12
  Administered 2017-08-12: 14:00:00 via INTRAVENOUS
  Filled 2017-08-12: qty 1000

## 2017-08-12 MED ORDER — SODIUM CHLORIDE 0.9 % IV SOLN
510.0000 mg | Freq: Once | INTRAVENOUS | Status: AC
  Administered 2017-08-12: 510 mg via INTRAVENOUS
  Filled 2017-08-12: qty 17

## 2017-08-12 MED ORDER — FERUMOXYTOL INJECTION 510 MG/17 ML
INTRAVENOUS | Status: AC
  Filled 2017-08-12: qty 17

## 2017-08-19 ENCOUNTER — Inpatient Hospital Stay: Payer: Medicare Other

## 2017-08-19 VITALS — BP 124/77 | HR 80 | Resp 20

## 2017-08-19 DIAGNOSIS — D509 Iron deficiency anemia, unspecified: Secondary | ICD-10-CM | POA: Diagnosis not present

## 2017-08-19 MED ORDER — SODIUM CHLORIDE 0.9 % IV SOLN
510.0000 mg | Freq: Once | INTRAVENOUS | Status: AC
  Administered 2017-08-19: 510 mg via INTRAVENOUS
  Filled 2017-08-19: qty 17

## 2017-08-19 MED ORDER — SODIUM CHLORIDE 0.9 % IV SOLN
Freq: Once | INTRAVENOUS | Status: AC
  Administered 2017-08-19: 14:00:00 via INTRAVENOUS
  Filled 2017-08-19: qty 1000

## 2017-08-19 NOTE — Patient Instructions (Signed)

## 2017-11-11 ENCOUNTER — Ambulatory Visit: Payer: Medicare Other | Admitting: Oncology

## 2017-11-11 ENCOUNTER — Other Ambulatory Visit: Payer: Medicare Other

## 2017-11-13 ENCOUNTER — Other Ambulatory Visit
Admission: RE | Admit: 2017-11-13 | Discharge: 2017-11-13 | Disposition: A | Discharge status: Home / Self Care | Payer: Medicare Other | Source: Ambulatory Visit | Attending: Nurse Practitioner | Admitting: Nurse Practitioner

## 2017-11-13 DIAGNOSIS — K52831 Collagenous colitis: Secondary | ICD-10-CM | POA: Insufficient documentation

## 2017-11-13 LAB — GASTROINTESTINAL PANEL BY PCR, STOOL (REPLACES STOOL CULTURE)

## 2017-11-13 LAB — C DIFFICILE QUICK SCREEN W PCR REFLEX
C Diff antigen: NEGATIVE
C Diff interpretation: NOT DETECTED
C Diff toxin: NEGATIVE

## 2017-11-14 LAB — CALPROTECTIN, FECAL: Calprotectin, Fecal: 379 ug/g — ABNORMAL HIGH (ref 0–120)

## 2017-11-15 LAB — PANCREATIC ELASTASE, FECAL: Pancreatic Elastase-1, Stool: 234 ug Elast./g (ref >200)

## 2017-11-17 DIAGNOSIS — R195 Other fecal abnormalities: Secondary | ICD-10-CM | POA: Insufficient documentation

## 2018-02-24 ENCOUNTER — Other Ambulatory Visit: Payer: Self-pay | Admitting: Nurse Practitioner

## 2018-02-24 DIAGNOSIS — D509 Iron deficiency anemia, unspecified: Secondary | ICD-10-CM

## 2018-02-24 DIAGNOSIS — K9289 Other specified diseases of the digestive system: Secondary | ICD-10-CM

## 2018-03-05 ENCOUNTER — Ambulatory Visit
Admission: RE | Admit: 2018-03-05 | Discharge: 2018-03-05 | Disposition: A | Discharge status: Home / Self Care | Payer: Medicare Other | Source: Ambulatory Visit | Attending: Nurse Practitioner | Admitting: Nurse Practitioner

## 2018-03-05 DIAGNOSIS — D509 Iron deficiency anemia, unspecified: Secondary | ICD-10-CM

## 2018-03-05 DIAGNOSIS — K9289 Other specified diseases of the digestive system: Secondary | ICD-10-CM

## 2018-03-05 MED ORDER — IOPAMIDOL (ISOVUE-300) INJECTION 61%: 100.0000 mL | Freq: Once | INTRAVENOUS | Status: DC | PRN

## 2018-03-05 MED ORDER — IOPAMIDOL (ISOVUE-300) INJECTION 61%
100.0000 mL | Freq: Once | INTRAVENOUS | Status: AC | PRN
  Administered 2018-03-05: 100 mL via INTRAVENOUS

## 2018-08-13 DIAGNOSIS — R933 Abnormal findings on diagnostic imaging of other parts of digestive tract: Secondary | ICD-10-CM | POA: Insufficient documentation

## 2018-08-13 DIAGNOSIS — R1012 Left upper quadrant pain: Secondary | ICD-10-CM | POA: Insufficient documentation

## 2018-08-14 ENCOUNTER — Ambulatory Visit
Admission: RE | Admit: 2018-08-14 | Discharge: 2018-08-14 | Disposition: A | Discharge status: Home / Self Care | Payer: Medicare Other | Source: Ambulatory Visit | Attending: Nurse Practitioner | Admitting: Nurse Practitioner

## 2018-08-14 ENCOUNTER — Other Ambulatory Visit: Payer: Self-pay | Admitting: Nurse Practitioner

## 2018-08-14 ENCOUNTER — Other Ambulatory Visit: Payer: Self-pay

## 2018-08-14 DIAGNOSIS — R1012 Left upper quadrant pain: Secondary | ICD-10-CM

## 2018-08-14 DIAGNOSIS — K311 Adult hypertrophic pyloric stenosis: Secondary | ICD-10-CM | POA: Diagnosis present

## 2018-08-14 MED ORDER — IOHEXOL 300 MG/ML  SOLN
75.0000 mL | Freq: Once | INTRAMUSCULAR | Status: AC | PRN
  Administered 2018-08-14: 75 mL via INTRAVENOUS

## 2018-08-15 ENCOUNTER — Telehealth: Payer: Self-pay | Admitting: *Deleted

## 2018-08-15 NOTE — Telephone Encounter (Signed)
Called patient due to getting a referral from Denice Paradise NP at [facility] for a upper endoscopy/colonoscopy with Dr.Byrnett. Patient is now aware that Dr.Byrnett is no longer with the practice and right now our other providers do not provide this type of procedure. I told the patient that we can refer her to [facility] or she can contact Denice Paradise to see what they recommend, so patient is going to do that to see what options are out there.

## 2018-08-19 ENCOUNTER — Encounter: Payer: Self-pay | Admitting: General Surgery

## 2018-08-19 NOTE — Progress Notes (Signed)
[facility]  [address] [place], [place] [address] Phone: [phone]  Fax: [phone]   Clinic Day:  08/20/2018  Referring physician: Marval Regal, NP  Chief Complaint: Carol Shaffer is a 73 y.o. 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  HPI: The patient was initially seen in the hematology clinic on 05/20/2017 by Dr. Randa Evens for hyopgammaglobulinemia.  SPEP was checked in 01/2017.  IgG levels have been in the low 300s. Work-up showed: hematocrit 37.7, hemoglobin 11.9. Ferritin was 9, iron saturation was 18% and TIBC 383.5. IgG was 319, IgA 76, and IgM 72.  M-protein was not observed. IVIG was not required.   EGD on 08/03/2016 revealed normal esophagus.  A stenosed Billroth I gastroduodenostomy was found, characterized by edema and erythema. Normal examined duodenum.  Colonoscopy on 03/05/2017 revealed healthy appearing mucosa. There were non-bleeding internal hemorrhoids. The examination was otherwise normal.  She has been followed by Dr. Janese Banks. Notes reviewed.   She has a history of lung cancer in 2012 s/p right upper lobe resection. She did not undergo chemotherapy or radiation. She also has a history of peptic ulcer disease and is status post gastrojejunostomy in 72s. She was also found to have small bowel obstruction and colonic perforation in the past s/p resection and  Colostomy.  She subsequently she has had adhesions in her abdomen. She has chronic abdominal pain. She has collagenous colitis and is on budesonide.  The patient was last seen in the hematology clinic on 07/15/2017 by Dr. Janese Banks. At that time, she had dark stools secondary to oral iron. She denied any infections. She was doing well.  Hematocrit was 37.5, hemoglobin was 12.2, MCV 93.4, platelets 395,000, and WBC 6500.  Ferritin was 14.  She received Feraheme x2 on 08/12/2017-08/19/2017.    She was seen by Denice Paradise, NP in the [facility] on 08/13/2018.  Labs on 08/13/2018 revealed a hematocrit of 34.1, hemoglobin 9.8, MCV 88.8, platelets 523,000, WBC 9000.  Ferritin was 7 with an iron saturation of 18% and a TIBC of 309.9.  Albumin was 3.2.  Creatinine was 0.8.  B12 was > 1500 and folate > 22.3 on 06/10/2017.  Urinalysis on 02/26/2017 revealed no protein.  During the interim, she has been significantly fatigued. She denies any issues with infections. 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 She denies any fevers or sweats. She notes vomiting and diarrhea over the past few weeks in addition to weight loss. She has seasonal allergies with rhinorrhea in the mornings. She denies any shortness of breath or chest pain. She denies any blood in her stools or black stools. She denies any bone or joint issues. She notes bleeding hemorrhoids.   Her "stomach"  is stretched every 2-3 years. She prefers this to undergoing surgery.   Oral iron has not provided improvement in the past. She is currently on an oral B-12 supplement. She does not take a folate supplement.   She has receding gums, but denies gingivitis. She has been unable to see her dentist due to the pandemic.   She is interested in the low dose chest CT lung cancer screening program.   She will undergo an EGD on 09/01/2018; she is followed in GI by Dr. Alice Reichert. Her PCP is Dr. Benita Stabile. She has never seen an immunologist. She denies any history of Crohn's disease.    Past Medical History:  Diagnosis  Date  . Arthritis   . Avascular necrosis of hip, right ([place])   . Blepharospasm   . Cancer ([place]) 2011   lung  . Chronic diarrhea   . COPD (chronic obstructive pulmonary disease) ([place])   . Diverticulosis   . Hemorrhoid   . History of Crohn's disease   . Intestinal adhesions   . Multiple gastric ulcers   . Multiple gastric ulcers   . Stroke [facility])     Past Surgical History:  Procedure Laterality Date  .  APPENDECTOMY  1989  . botox injections for blepharospasm    . BREAST SURGERY    . CATARACT EXTRACTION    . COLON RESECTION  2005  . COLONOSCOPY  10-29-2008   [name]  . COLONOSCOPY WITH PROPOFOL N/A 03/05/2017   Procedure: COLONOSCOPY WITH PROPOFOL;  Surgeon: Toledo, Benay Pike, MD;  Location: [facility] ENDOSCOPY;  Service: Gastroenterology;  Laterality: N/A;  . COLOSTOMY REVERSAL  2006  . ESOPHAGOGASTRODUODENOSCOPY (EGD) WITH PROPOFOL N/A 07/18/2016   Procedure: ESOPHAGOGASTRODUODENOSCOPY (EGD) WITH PROPOFOL;  Surgeon: Robert Bellow, MD;  Location: [facility] ENDOSCOPY;  Service: Endoscopy;  Laterality: N/A;  . ESOPHAGOGASTRODUODENOSCOPY (EGD) WITH PROPOFOL N/A 08/03/2016   Procedure: ESOPHAGOGASTRODUODENOSCOPY (EGD) WITH PROPOFOL;  Surgeon: Robert Bellow, MD;  Location: [facility] ENDOSCOPY;  Service: Endoscopy;  Laterality: N/A;  . ESOPHAGOSCOPY WITH DILITATION  2012,2015   [facility] [place]  . EYE SURGERY    . gastric ulcer  1989, 1991  . JOINT REPLACEMENT     right total hip arthroplasty 03/03/02  . LUNG CANCER SURGERY  2011  . LYSIS OF ADHESION    . PLACEMENT OF BREAST IMPLANTS  1973  . UPPER GASTROINTESTINAL ENDOSCOPY  10-29-2008   [name]    History reviewed. No pertinent family history.  Social History:  reports that she quit smoking about 10 years ago. She quit after 25.00 years of use. She has never used smokeless tobacco. She reports current alcohol use of about 7.0 standard drinks of alcohol per week. She reports that she does not use drugs. She has a 30 pack-year smoking history (30 years x 1ppd). She quit when she was diagnosed with lung cancer in 2012. She denies any known exposure to radiations or toxins. 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 The patient is alone today.  Allergies:  Allergies  Allergen Reactions  . Hydromorphone Hcl Itching   . Bentyl [Dicyclomine] Nausea And Vomiting  . Biaxin [Clarithromycin] Other (See Comments)    hallucinations  . Erythromycin Nausea And Vomiting  . Reglan [Metoclopramide] Other (See Comments)    tremors    Current Medications: Current Outpatient Medications  Medication Sig Dispense Refill  . brimonidine-timolol (COMBIGAN) 0.2-0.5 % ophthalmic solution INT 1 GTT IN OU BID    . budesonide (ENTOCORT EC) 3 MG 24 hr capsule Take 3-6 mg by mouth daily.     . ferrous sulfate 325 (65 FE) MG tablet Take 325 mg by mouth every other day.    Marland Kitchen HYDROcodone-acetaminophen (NORCO/VICODIN) 5-325 MG per tablet Take 1 tablet by mouth every 6 (six) hours as needed.     . loratadine (CLARITIN) 10 MG tablet Take 10 mg by mouth daily.    . Multiple Vitamin (MULTI-VITAMINS) TABS Take 1 tablet by mouth daily. once daily.    . pantoprazole (PROTONIX) 40 MG tablet Take 40 mg by  mouth 2 (two) times daily.     . sucralfate (CARAFATE) 1 G tablet Take 1 g by mouth 4 (four) times daily.     . traZODone (DESYREL) 150 MG tablet Take 300 mg by mouth at bedtime.     Marland Kitchen venlafaxine XR (EFFEXOR-XR) 150 MG 24 hr capsule Take 150 mg by mouth daily with breakfast.     . ondansetron (ZOFRAN) 4 MG tablet Take 1 tablet (4 mg total) by mouth every 8 (eight) hours as needed for nausea or vomiting. (Patient not taking: Reported on 05/20/2017) 10 tablet 0   No current facility-administered medications for this visit.     Review of Systems  Constitutional: Positive for malaise/fatigue and weight loss. Negative for chills, diaphoresis and fever.  HENT: Negative.  Negative for congestion, hearing loss, sinus pain and sore throat.   Eyes: Negative.  Negative for blurred vision.  Respiratory: Negative.  Negative for cough, shortness of breath and wheezing.   Cardiovascular: Negative.  Negative for chest pain, palpitations, claudication, leg swelling and PND.  Gastrointestinal: Positive for diarrhea and vomiting. Negative for abdominal  pain, blood in stool, constipation, melena and nausea.       Hemorrhoids.  Genitourinary: Negative.  Negative for dysuria, frequency, hematuria and urgency.  Musculoskeletal: Negative.  Negative for back pain, joint pain and myalgias.  Skin: Negative.  Negative for rash.  Neurological: Negative.  Negative for dizziness, tingling, sensory change, weakness and headaches.  Endo/Heme/Allergies: Positive for environmental allergies (rhinorrea ). Does not bruise/bleed easily.  Psychiatric/Behavioral: Negative.  Negative for depression, memory loss and substance abuse. The patient is not nervous/anxious and does not have insomnia.   All other systems reviewed and are negative.  Performance status (ECOG): 1  Vitals Blood pressure 133/62, pulse 77, temperature 97.9 F (36.6 C), temperature source Tympanic, resp. rate 16, weight 85 lb 3.3 oz (38.6 kg), SpO2 100 %.   Physical Exam  Constitutional: She is oriented to person, place, and time. She appears well-developed and well-nourished. No distress.  HENT:  Head: Normocephalic and atraumatic.  Mouth/Throat: No oropharyngeal exudate.  Short brown hair. Mask.  Eyes: Pupils are equal, round, and reactive to light. Conjunctivae and EOM are normal. No scleral icterus.  Glasses.  Blue eyes.  Neck: Normal range of motion. Neck supple.  Cardiovascular: Normal rate, regular rhythm and normal heart sounds.  No murmur heard. Pulmonary/Chest: Effort normal and breath sounds normal. No respiratory distress. She has no wheezes.  Abdominal: Soft. Bowel sounds are normal. She exhibits no distension. There is no hepatosplenomegaly. There is abdominal tenderness.  Musculoskeletal: Normal range of motion.        General: No tenderness or edema.  Lymphadenopathy:    She has no cervical adenopathy.    She has no axillary adenopathy.       Right: No supraclavicular adenopathy present.       Left: No supraclavicular adenopathy present.  Neurological: She is alert  and oriented to person, place, and time.  Skin: Skin is warm and dry. She is not diaphoretic. No erythema.  Psychiatric: She has a normal mood and affect. Her behavior is normal. Judgment and thought content normal.  Nursing note and vitals reviewed.   No visits with results within 3 Day(s) from this visit.  Latest known visit with results is:  Hospital Outpatient Visit on 11/13/2017  Component Date Value Ref Range Status  . Calprotectin, Fecal 11/13/2017 379* 0 - 120 ug/g Final   Comment: (NOTE) Concentration  Interpretation   Follow-Up <16 - 50 ug/g     Normal           None >50 -120 ug/g     Borderline       Re-evaluate in 4-6 weeks    >120 ug/g     Abnormal         Repeat as clinically                                   indicated Performed At: [facility] [place], [place] [id number] Rush Farmer MD [id number]   . C Diff antigen 11/13/2017 NEGATIVE  NEGATIVE Final  . C Diff toxin 11/13/2017 NEGATIVE  NEGATIVE Final  . C Diff interpretation 11/13/2017 No C. difficile detected.   Final   Performed at [facility], [address], [place], [place] [address]  . Campylobacter species 11/13/2017 NOT DETECTED  NOT DETECTED Final  . Plesimonas shigelloides 11/13/2017 NOT DETECTED  NOT DETECTED Final  . Salmonella species 11/13/2017 NOT DETECTED  NOT DETECTED Final  . Yersinia enterocolitica 11/13/2017 NOT DETECTED  NOT DETECTED Final  . Vibrio species 11/13/2017 NOT DETECTED  NOT DETECTED Final  . Vibrio cholerae 11/13/2017 NOT DETECTED  NOT DETECTED Final  . Enteroaggregative E coli (EAEC) 11/13/2017 NOT DETECTED  NOT DETECTED Final  . Enteropathogenic E coli (EPEC) 11/13/2017 NOT DETECTED  NOT DETECTED Final  . Enterotoxigenic E coli (ETEC) 11/13/2017 NOT DETECTED  NOT DETECTED Final  . Shiga like toxin producing E coli * 11/13/2017 NOT DETECTED  NOT DETECTED Final  . Shigella/Enteroinvasive E coli (EI* 11/13/2017 NOT DETECTED  NOT DETECTED  Final  . Cryptosporidium 11/13/2017 NOT DETECTED  NOT DETECTED Final  . Cyclospora cayetanensis 11/13/2017 NOT DETECTED  NOT DETECTED Final  . Entamoeba histolytica 11/13/2017 NOT DETECTED  NOT DETECTED Final  . Giardia lamblia 11/13/2017 NOT DETECTED  NOT DETECTED Final  . Adenovirus F40/41 11/13/2017 NOT DETECTED  NOT DETECTED Final  . Astrovirus 11/13/2017 NOT DETECTED  NOT DETECTED Final  . Norovirus GI/GII 11/13/2017 NOT DETECTED  NOT DETECTED Final  . Rotavirus A 11/13/2017 NOT DETECTED  NOT DETECTED Final  . Sapovirus (I, II, IV, and V) 11/13/2017 NOT DETECTED  NOT DETECTED Final   Performed at [facility], [address], [place], [place] [address]  . Pancreatic Elastase-1, Stool 11/13/2017 234  >200 ug Elast./g Final   Comment: (NOTE)       Severe Pancreatic Insufficiency:          <100       Moderate Pancreatic Insufficiency:   100 - 200       Normal:                                   >200 Performed At: [facility] [place], [place] [id number] Rush Farmer MD [id number]     Assessment:  Carol Shaffer is a 73 y.o. 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  IgG levels has been in the low 300s. M-protein is 0.  She has not received IVIG.  She has not had issues with recurrent infections.  She has collagenous colitis and is on budesonide.  She has a history of peptic ulcer disease and is s/p gastrojejunostomy in 1980s. She had a small bowel obstruction and colonic perforation in the past  s/p resection colostomy. She has chronic abdominal pain.   She has a history of lung cancer in 2012 s/p right upper lobe resection.   She has a history of iron deficiency anemia.  She received Feraheme x2 on 08/12/2017 - 08/19/2017.  Oral iron has been ineffective in the past  Ferritin has been followed: 9 on 05/07/2017, 14 on 07/15/2017, 66 on 11/07/2017, 12 on 02/12/2018, and 7 on 08/13/2018.  Symptomatically, she notes fatigue.  She  has chronic diarrhea.  She denies any infections.  Exam reveals no adenopathy or hepatosplenomegaly.  Plan: 1.   Labs today:  CBC with diff, myeloma panel, ferritin, iron studies, B12, folate 2.   Hypogammaglobulinemia  Patient has a longstanding history of hypogammaglobulinemia.  SPEP has revealed no monoclonal protein.  Discuss consideration of evaluation by immunology. 3.   Iron deficiency  Hematocrit 34.1.  Hemoglobin 9.8.  MCV 88.8.  Ferritin 7.  Preauth Feraheme.  Feraheme today. 4.   Lung cancer  Patient s/p RUL lobectomy 2012.  Discuss low-dose annual chest CT program.  Patient interested.  Contact Melinda Crutch, RN. 5.   RTC in 3 months for labs (CBC with diff, ferritin, iron studies, B12, folate). 6.   RTC in 6 months for MD assessment, labs (CBC with diff, ferritin- day before), and +/- Feraheme.  I discussed the assessment and treatment plan with the patient.  The patient was provided an opportunity to ask questions and all were answered.  The patient agreed with the plan and demonstrated an understanding of the instructions.  The patient was advised to call back if the symptoms worsen or if the condition fails to improve as anticipated.  I provided 25 minutes of face-to-face time during this this encounter and > 50% was spent counseling as documented under my assessment and plan.    Lequita Asal, MD, PhD    08/20/2018, 11:51 AM  I, Cloyde Reams Dorshimer, am acting as Education administrator for [employer]. Mike Gip, MD, PhD.  I,  C. Mike Gip, MD, have reviewed the above documentation for accuracy and completeness, and I agree with the above.

## 2018-08-20 ENCOUNTER — Inpatient Hospital Stay: Payer: Medicare Other

## 2018-08-20 ENCOUNTER — Inpatient Hospital Stay: Payer: Medicare Other | Attending: Hematology and Oncology | Admitting: Hematology and Oncology

## 2018-08-20 ENCOUNTER — Encounter: Payer: Self-pay | Admitting: Hematology and Oncology

## 2018-08-20 ENCOUNTER — Other Ambulatory Visit: Payer: Self-pay

## 2018-08-20 VITALS — BP 135/84 | HR 77 | Temp 97.2°F | Resp 16

## 2018-08-20 VITALS — BP 133/62 | HR 77 | Temp 97.9°F | Resp 16 | Wt 85.2 lb

## 2018-08-20 DIAGNOSIS — D801 Nonfamilial hypogammaglobulinemia: Secondary | ICD-10-CM | POA: Diagnosis not present

## 2018-08-20 DIAGNOSIS — D509 Iron deficiency anemia, unspecified: Secondary | ICD-10-CM

## 2018-08-20 DIAGNOSIS — R197 Diarrhea, unspecified: Secondary | ICD-10-CM

## 2018-08-20 DIAGNOSIS — R5383 Other fatigue: Secondary | ICD-10-CM

## 2018-08-20 DIAGNOSIS — R1110 Vomiting, unspecified: Secondary | ICD-10-CM

## 2018-08-20 DIAGNOSIS — Z85118 Personal history of other malignant neoplasm of bronchus and lung: Secondary | ICD-10-CM

## 2018-08-20 MED ORDER — SODIUM CHLORIDE 0.9 % IV SOLN
Freq: Once | INTRAVENOUS | Status: AC
  Administered 2018-08-20: 13:00:00 via INTRAVENOUS
  Filled 2018-08-20: qty 250

## 2018-08-20 MED ORDER — SODIUM CHLORIDE 0.9 % IV SOLN
510.0000 mg | Freq: Once | INTRAVENOUS | Status: AC
  Administered 2018-08-20: 510 mg via INTRAVENOUS
  Filled 2018-08-20: qty 17

## 2018-08-20 NOTE — Patient Instructions (Signed)

## 2018-08-20 NOTE — Progress Notes (Signed)
PT here for follow up. Previous Dr. Janese Banks patient. Patient reports Nausea and Vomiting x 2 weeks. Last episode of vomiting 2 days ago. Patient states she is scheduled to have endoscopy on 8/3. Seeing Dr. Vira Agar for this. Patient also reports issue with hemorrhoids and is requesting RX. Advised patient she would need to discuss with PCP but that I would make Dr. Mike Gip aware.

## 2018-08-28 ENCOUNTER — Other Ambulatory Visit: Payer: Self-pay

## 2018-08-28 ENCOUNTER — Other Ambulatory Visit
Admission: RE | Admit: 2018-08-28 | Discharge: 2018-08-28 | Disposition: A | Discharge status: Home / Self Care | Payer: Medicare Other | Source: Ambulatory Visit | Attending: Internal Medicine | Admitting: Internal Medicine

## 2018-08-28 DIAGNOSIS — Z20828 Contact with and (suspected) exposure to other viral communicable diseases: Secondary | ICD-10-CM | POA: Diagnosis present

## 2018-08-29 ENCOUNTER — Telehealth: Payer: Self-pay | Admitting: *Deleted

## 2018-08-29 DIAGNOSIS — Z87891 Personal history of nicotine dependence: Secondary | ICD-10-CM

## 2018-08-29 DIAGNOSIS — Z122 Encounter for screening for malignant neoplasm of respiratory organs: Secondary | ICD-10-CM

## 2018-08-29 LAB — SARS CORONAVIRUS 2 (TAT 6-24 HRS): SARS Coronavirus 2: NEGATIVE

## 2018-08-29 NOTE — Telephone Encounter (Signed)
error 

## 2018-08-29 NOTE — H&P (Signed)
  Outpatient short stay form Pre-procedure 08/29/2018 4:44 PM  K. Alice Reichert, M.D.  Primary Physician: Benita Stabile, M.D.  Reason for visit:  LUQ pain, hx of gastric outlet obstruction  History of present illness:   Patient with above complaints. Recent CT scan of abdomen and pelvis, however, showed no bowel obstruction or ileus. Surgical changes were noted, however, no gastric outlet obstruction was noted and no ileus. Chronic intrahepatic and extrahepatic biliary dilation was noted. Renal cysts.   No current facility-administered medications for this encounter.   Current Outpatient Medications:  .  brimonidine-timolol (COMBIGAN) 0.2-0.5 % ophthalmic solution, INT 1 GTT IN OU BID, Disp: , Rfl:  .  budesonide (ENTOCORT EC) 3 MG 24 hr capsule, Take 3-6 mg by mouth daily. , Disp: , Rfl:  .  ferrous sulfate 325 (65 FE) MG tablet, Take 325 mg by mouth every other day., Disp: , Rfl:  .  HYDROcodone-acetaminophen (NORCO/VICODIN) 5-325 MG per tablet, Take 1 tablet by mouth every 6 (six) hours as needed. , Disp: , Rfl:  .  loratadine (CLARITIN) 10 MG tablet, Take 10 mg by mouth daily., Disp: , Rfl:  .  Multiple Vitamin (MULTI-VITAMINS) TABS, Take 1 tablet by mouth daily. once daily., Disp: , Rfl:  .  ondansetron (ZOFRAN) 4 MG tablet, Take 1 tablet (4 mg total) by mouth every 8 (eight) hours as needed for nausea or vomiting. (Patient not taking: Reported on 05/20/2017), Disp: 10 tablet, Rfl: 0 .  pantoprazole (PROTONIX) 40 MG tablet, Take 40 mg by mouth 2 (two) times daily. , Disp: , Rfl:  .  sucralfate (CARAFATE) 1 G tablet, Take 1 g by mouth 4 (four) times daily. , Disp: , Rfl:  .  traZODone (DESYREL) 150 MG tablet, Take 300 mg by mouth at bedtime. , Disp: , Rfl:  .  venlafaxine XR (EFFEXOR-XR) 150 MG 24 hr capsule, Take 150 mg by mouth daily with breakfast. , Disp: , Rfl:   No medications prior to admission.     Allergies  Allergen Reactions  . Hydromorphone Hcl Itching  . Bentyl  [Dicyclomine] Nausea And Vomiting  . Biaxin [Clarithromycin] Other (See Comments)    hallucinations  . Erythromycin Nausea And Vomiting  . Reglan [Metoclopramide] Other (See Comments)    tremors     Past Medical History:  Diagnosis Date  . Arthritis   . Avascular necrosis of hip, right ([place])   . Blepharospasm   . Cancer ([place]) 2011   lung  . Chronic diarrhea   . COPD (chronic obstructive pulmonary disease) ([place])   . Diverticulosis   . Hemorrhoid   . History of Crohn's disease   . Intestinal adhesions   . Multiple gastric ulcers   . Multiple gastric ulcers   . Stroke [facility])     Review of systems:  Otherwise negative.    Physical Exam  Gen: Alert, oriented. Appears stated age.  HEENT: [place]/AT. PERRLA. Lungs: CTA, no wheezes. CV: RR nl S1, S2. Abd: soft, benign, no masses. BS+ Ext: No edema. Pulses 2+    Planned procedures: Proceed with EGD. The patient understands the nature of the planned procedure, indications, risks, alternatives and potential complications including but not limited to bleeding, infection, perforation, damage to internal organs and possible oversedation/side effects from anesthesia. The patient agrees and gives consent to proceed.  Please refer to procedure notes for findings, recommendations and patient disposition/instructions.      K. Alice Reichert, M.D. Gastroenterology 08/29/2018  4:44 PM

## 2018-08-29 NOTE — Telephone Encounter (Signed)
Received referral for initial lung cancer screening scan. Contacted patient and obtained smoking history,(former, quit 2012, 30 pack year) as well as answering questions related to screening process. Patient denies signs of lung cancer such as weight loss or hemoptysis. Patient denies comorbidity that would prevent curative treatment if lung cancer were found. Patient is scheduled for shared decision making visit and CT scan on 09/04/18 at 130pm.

## 2018-09-01 ENCOUNTER — Ambulatory Visit: Payer: Medicare Other | Admitting: Anesthesiology

## 2018-09-01 ENCOUNTER — Encounter: Payer: Self-pay | Admitting: Anesthesiology

## 2018-09-01 ENCOUNTER — Other Ambulatory Visit: Payer: Self-pay

## 2018-09-01 ENCOUNTER — Ambulatory Visit
Admission: RE | Admit: 2018-09-01 | Discharge: 2018-09-01 | Disposition: A | Discharge status: Home / Self Care | Payer: Medicare Other | Attending: Internal Medicine | Admitting: Internal Medicine

## 2018-09-01 ENCOUNTER — Encounter
Admission: RE | Disposition: A | Discharge status: Home / Self Care | Payer: Self-pay | Source: Home / Self Care | Attending: Internal Medicine

## 2018-09-01 DIAGNOSIS — K5090 Crohn's disease, unspecified, without complications: Secondary | ICD-10-CM | POA: Insufficient documentation

## 2018-09-01 DIAGNOSIS — Z8673 Personal history of transient ischemic attack (TIA), and cerebral infarction without residual deficits: Secondary | ICD-10-CM | POA: Insufficient documentation

## 2018-09-01 DIAGNOSIS — K913 Postprocedural intestinal obstruction, unspecified as to partial versus complete: Secondary | ICD-10-CM | POA: Insufficient documentation

## 2018-09-01 DIAGNOSIS — Z980 Intestinal bypass and anastomosis status: Secondary | ICD-10-CM | POA: Diagnosis not present

## 2018-09-01 DIAGNOSIS — K319 Disease of stomach and duodenum, unspecified: Secondary | ICD-10-CM | POA: Insufficient documentation

## 2018-09-01 DIAGNOSIS — K228 Other specified diseases of esophagus: Secondary | ICD-10-CM | POA: Insufficient documentation

## 2018-09-01 DIAGNOSIS — Z87891 Personal history of nicotine dependence: Secondary | ICD-10-CM | POA: Diagnosis not present

## 2018-09-01 DIAGNOSIS — Z85118 Personal history of other malignant neoplasm of bronchus and lung: Secondary | ICD-10-CM | POA: Insufficient documentation

## 2018-09-01 DIAGNOSIS — R1012 Left upper quadrant pain: Secondary | ICD-10-CM | POA: Insufficient documentation

## 2018-09-01 DIAGNOSIS — J449 Chronic obstructive pulmonary disease, unspecified: Secondary | ICD-10-CM | POA: Diagnosis not present

## 2018-09-01 DIAGNOSIS — Y832 Surgical operation with anastomosis, bypass or graft as the cause of abnormal reaction of the patient, or of later complication, without mention of misadventure at the time of the procedure: Secondary | ICD-10-CM | POA: Diagnosis not present

## 2018-09-01 DIAGNOSIS — D649 Anemia, unspecified: Secondary | ICD-10-CM | POA: Insufficient documentation

## 2018-09-01 DIAGNOSIS — R1013 Epigastric pain: Secondary | ICD-10-CM | POA: Diagnosis present

## 2018-09-01 DIAGNOSIS — Z79899 Other long term (current) drug therapy: Secondary | ICD-10-CM | POA: Diagnosis not present

## 2018-09-01 DIAGNOSIS — Z8711 Personal history of peptic ulcer disease: Secondary | ICD-10-CM | POA: Diagnosis not present

## 2018-09-01 HISTORY — PX: ESOPHAGOGASTRODUODENOSCOPY (EGD) WITH PROPOFOL: SHX5813

## 2018-09-01 LAB — KOH PREP
KOH Prep: NONE SEEN
Special Requests: NORMAL

## 2018-09-01 SURGERY — ESOPHAGOGASTRODUODENOSCOPY (EGD) WITH PROPOFOL
Anesthesia: General

## 2018-09-01 MED ORDER — PROPOFOL 10 MG/ML IV BOLUS
INTRAVENOUS | Status: AC
  Filled 2018-09-01: qty 20

## 2018-09-01 MED ORDER — SODIUM CHLORIDE 0.9 % IV SOLN
INTRAVENOUS | Status: DC
  Administered 2018-09-01: 09:00:00 via INTRAVENOUS

## 2018-09-01 MED ORDER — LIDOCAINE HCL (CARDIAC) PF 100 MG/5ML IV SOSY
PREFILLED_SYRINGE | INTRAVENOUS | Status: DC | PRN
  Administered 2018-09-01: 60 mg via INTRAVENOUS

## 2018-09-01 MED ORDER — PROPOFOL 10 MG/ML IV BOLUS
INTRAVENOUS | Status: DC | PRN
  Administered 2018-09-01: 30 ug via INTRAVENOUS
  Administered 2018-09-01 (×3): 20 ug via INTRAVENOUS
  Administered 2018-09-01: 30 ug via INTRAVENOUS
  Administered 2018-09-01: 60 ug via INTRAVENOUS
  Administered 2018-09-01: 20 ug via INTRAVENOUS

## 2018-09-01 NOTE — Op Note (Signed)
[facility] Gastroenterology Patient Name: Carol Shaffer Procedure Date: 09/01/2018 9:41 AM MRN: [id number] Account #: [id number] Date of Birth: 10-02-1945 Admit Type: Outpatient Age: 73 Room: [facility] ENDO ROOM 1 Gender: Female Note Status: Finalized Procedure:            Upper GI endoscopy Indications:          Epigastric abdominal pain, Abdominal pain in the left                        upper quadrant, Abnormal CT of the GI tract, Management                        of operative complication: Dilation of anastomotic                        stricture Providers:            Benay Pike. Alice Reichert MD, MD Referring MD:         Leona Carry. Hall Busing, MD (Referring MD) Medicines:            Propofol per Anesthesia Complications:        No immediate complications. Estimated blood loss:                        Minimal. Procedure:            Pre-Anesthesia Assessment:                       - The risks and benefits of the procedure and the                        sedation options and risks were discussed with the                        patient. All questions were answered and informed                        consent was obtained.                       - Patient identification and proposed procedure were                        verified prior to the procedure by the nurse. The                        procedure was verified in the procedure room.                       - ASA Grade Assessment: III - A patient with severe                        systemic disease.                       - After reviewing the risks and benefits, the patient                        was deemed in satisfactory condition to undergo the  procedure.                       After obtaining informed consent, the endoscope was                        passed under direct vision. Throughout the procedure,                        the patient's blood pressure, pulse, and oxygen                        saturations were  monitored continuously. The Endoscope                        was introduced through the mouth, and advanced to the                        jejunum. The upper GI endoscopy was somewhat difficult                        due to stricture. Successful completion of the                        procedure was aided by withdrawing and reinserting the                        scope. The patient tolerated the procedure well. Findings:      Patchy, white plaques were found in the middle third of the esophagus.       Cells for cytology were obtained by brushing.      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 A       gastric pouch with a medium size was found containing bile and food       debris. The gastroduodenal anastomosis was characterized by ulceration       and an intact staple line. This was traversed after dilation. The lesion       was not amenable to dilation, and this was not attempted. stenosed area       was dilated 2-3 mm with tip of endoscope some self limited bleeding was       from ulcer at gastrojejunal anastomosis after dilation.      The cardia and gastric fundus were normal on retroflexion.      Diffuse moderate mucosal changes characterized by linear erosions,       sloughing and altered texture were found in the jejunum. Biopsies were       taken with a cold forceps for histology.      A moderate benign-appearing, intrinsic stenosis that was traversed was       found in the jejunum. Estimated blood loss was minimal. Impression:           - Esophageal plaques were found, suspicious for                        candidiasis. Cells for cytology obtained.                       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                        Lesion not amenable to dilation, and not attempted.                       - Mucosal changes in the jejunum. Biopsied.                       -  Jejunal stenosis. Recommendation:       - Patient has a contact number available for                        emergencies. The signs and symptoms of potential                        delayed complications were discussed with the patient.                        Return to normal activities tomorrow. Written discharge                        instructions were provided to the patient.                       - Resume previous diet.                       - Continue present medications.                       - Await pathology results.                       - MR angiography to consider small bowel mesenteric                        ischemia.                       - Return to physician assistant in 1 week.                       - The findings and recommendations were discussed with                        the patient. Procedure Code(s):    --- Professional ---                       [phone], Esophagogastroduodenoscopy, flexible, transoral;                        with biopsy, single or multiple Diagnosis Code(s):    --- Professional ---                       R93.3, Abnormal findings on diagnostic imaging of other                        parts of digestive tract                       K91.89, Other postprocedural complications and  disorders of digestive system                       R10.12, Left upper quadrant pain                       R10.13, Epigastric pain                       K56.699, Other intestinal obstruction unspecified as to                        partial versus complete obstruction                       K63.89, Other specified diseases of intestine                       Z98.0, Intestinal bypass and anastomosis status                       K22.9, Disease of esophagus, unspecified CPT copyright 2019 American Medical Association. All rights reserved. The codes documented in this report are preliminary and upon coder review may  be revised to meet current compliance  requirements. Efrain Sella MD, MD 09/01/2018 10:12:22 AM This report has been signed electronically. Number of Addenda: 0 Note Initiated On: 09/01/2018 9:41 AM Estimated Blood Loss: Estimated blood loss was minimal.      [facility]

## 2018-09-01 NOTE — Transfer of Care (Signed)
Immediate Anesthesia Transfer of Care Note  Patient: Carol Shaffer  Procedure(s) Performed: ESOPHAGOGASTRODUODENOSCOPY (EGD) WITH PROPOFOL (N/A )  Patient Location: PACU and Endoscopy Unit  Anesthesia Type:General  Level of Consciousness: sedated  Airway & Oxygen Therapy: Patient Spontanous Breathing and Patient connected to nasal cannula oxygen  Post-op Assessment: Report given to RN and Post -op Vital signs reviewed and stable  Post vital signs: Reviewed and stable  Last Vitals:  Vitals Value Taken Time  BP 91/43 09/01/18 1005  Temp    Pulse 78 09/01/18 1005  Resp 14 09/01/18 1005  SpO2 98 % 09/01/18 1005  Vitals shown include unvalidated device data.  Last Pain:  Vitals:   09/01/18 0842  TempSrc: Tympanic         Complications: No apparent anesthesia complications

## 2018-09-01 NOTE — Interval H&P Note (Signed)
History and Physical Interval Note:  09/01/2018 9:00 AM  Carol Shaffer  has presented today for surgery, with the diagnosis of LUQ PAIN.  The various methods of treatment have been discussed with the patient and family. After consideration of risks, benefits and other options for treatment, the patient has consented to  Procedure(s): ESOPHAGOGASTRODUODENOSCOPY (EGD) WITH PROPOFOL (N/A) as a surgical intervention.  The patient's history has been reviewed, patient examined, no change in status, stable for surgery.  I have reviewed the patient's chart and labs.  Questions were answered to the patient's satisfaction.     [place], [place]

## 2018-09-01 NOTE — Anesthesia Postprocedure Evaluation (Signed)
Anesthesia Post Note  Patient: Carol Shaffer  Procedure(s) Performed: ESOPHAGOGASTRODUODENOSCOPY (EGD) WITH PROPOFOL (N/A )  Patient location during evaluation: Endoscopy Anesthesia Type: General Level of consciousness: awake and alert and oriented Pain management: pain level controlled Vital Signs Assessment: post-procedure vital signs reviewed and stable Respiratory status: spontaneous breathing Cardiovascular status: blood pressure returned to baseline Anesthetic complications: no     Last Vitals:  Vitals:   09/01/18 1035 09/01/18 1036  BP:  140/80  Pulse:    Resp:    Temp:    SpO2: 96%     Last Pain:  Vitals:   09/01/18 1017  TempSrc:   PainSc: 0-No pain                 Teshaun Olarte

## 2018-09-01 NOTE — Anesthesia Preprocedure Evaluation (Signed)
Anesthesia Evaluation  Patient identified by MRN, date of birth, ID band Patient awake    Reviewed: Allergy & Precautions, NPO status , Patient's Chart, lab work & pertinent test results  History of Anesthesia Complications Negative for: history of anesthetic complications  Airway Mallampati: II  TM Distance: >3 FB Neck ROM: Full    Dental no notable dental hx.    Pulmonary neg sleep apnea, neg COPD, former smoker,    breath sounds clear to auscultation- rhonchi (-) wheezing      Cardiovascular Exercise Tolerance: Good (-) hypertension(-) CAD, (-) Past MI, (-) Cardiac Stents and (-) CABG  Rhythm:Regular Rate:Normal - Systolic murmurs and - Diastolic murmurs    Neuro/Psych CVA (L eye visual problems), Residual Symptoms negative psych ROS   GI/Hepatic Neg liver ROS, PUD,   Endo/Other  negative endocrine ROSneg diabetes  Renal/GU negative Renal ROS     Musculoskeletal  (+) Arthritis ,   Abdominal (+) - obese,   Peds  Hematology negative hematology ROS (+) anemia ,   Anesthesia Other Findings Past Medical History: No date: Arthritis No date: Asthma No date: Avascular necrosis of hip, right (HCC) No date: Blepharospasm 2011: Cancer ([place])     Comment:  lung No date: Chronic diarrhea No date: COPD (chronic obstructive pulmonary disease) (HCC) No date: Diverticulosis No date: Hemorrhoid No date: History of Crohn's disease No date: Intestinal adhesions No date: Multiple gastric ulcers No date: Multiple gastric ulcers No date: Stroke [facility])   Reproductive/Obstetrics                             Anesthesia Physical  Anesthesia Plan  ASA: III  Anesthesia Plan: General   Post-op Pain Management:    Induction: Intravenous  PONV Risk Score and Plan: 2 and Propofol infusion  Airway Management Planned: Natural Airway and Nasal Cannula  Additional Equipment:   Intra-op Plan:    Post-operative Plan:   Informed Consent: I have reviewed the patients History and Physical, chart, labs and discussed the procedure including the risks, benefits and alternatives for the proposed anesthesia with the patient or authorized representative who has indicated his/her understanding and acceptance.     Dental advisory given  Plan Discussed with: CRNA and Anesthesiologist  Anesthesia Plan Comments:         Anesthesia Quick Evaluation

## 2018-09-01 NOTE — Anesthesia Post-op Follow-up Note (Signed)
Anesthesia QCDR form completed.        

## 2018-09-02 ENCOUNTER — Other Ambulatory Visit: Payer: Self-pay | Admitting: Nurse Practitioner

## 2018-09-02 ENCOUNTER — Encounter: Payer: Self-pay | Admitting: Internal Medicine

## 2018-09-02 DIAGNOSIS — K529 Noninfective gastroenteritis and colitis, unspecified: Secondary | ICD-10-CM

## 2018-09-02 DIAGNOSIS — R109 Unspecified abdominal pain: Secondary | ICD-10-CM

## 2018-09-02 DIAGNOSIS — R197 Diarrhea, unspecified: Secondary | ICD-10-CM

## 2018-09-02 DIAGNOSIS — K283 Acute gastrojejunal ulcer without hemorrhage or perforation: Secondary | ICD-10-CM

## 2018-09-02 LAB — SURGICAL PATHOLOGY

## 2018-09-03 ENCOUNTER — Ambulatory Visit: Admission: RE | Admit: 2018-09-03 | Payer: Medicare Other | Source: Ambulatory Visit

## 2018-09-04 ENCOUNTER — Inpatient Hospital Stay: Payer: Medicare Other | Attending: Nurse Practitioner | Admitting: Hospice and Palliative Medicine

## 2018-09-04 ENCOUNTER — Ambulatory Visit
Admission: RE | Admit: 2018-09-04 | Discharge: 2018-09-04 | Disposition: A | Discharge status: Home / Self Care | Payer: Medicare Other | Source: Ambulatory Visit | Attending: Nurse Practitioner | Admitting: Nurse Practitioner

## 2018-09-04 ENCOUNTER — Ambulatory Visit: Payer: Medicare Other | Admitting: Oncology

## 2018-09-04 ENCOUNTER — Other Ambulatory Visit: Payer: Self-pay

## 2018-09-04 DIAGNOSIS — Z933 Colostomy status: Secondary | ICD-10-CM | POA: Insufficient documentation

## 2018-09-04 DIAGNOSIS — R109 Unspecified abdominal pain: Secondary | ICD-10-CM | POA: Diagnosis present

## 2018-09-04 DIAGNOSIS — D801 Nonfamilial hypogammaglobulinemia: Secondary | ICD-10-CM | POA: Insufficient documentation

## 2018-09-04 DIAGNOSIS — Z87891 Personal history of nicotine dependence: Secondary | ICD-10-CM | POA: Diagnosis present

## 2018-09-04 DIAGNOSIS — K529 Noninfective gastroenteritis and colitis, unspecified: Secondary | ICD-10-CM | POA: Diagnosis present

## 2018-09-04 DIAGNOSIS — R197 Diarrhea, unspecified: Secondary | ICD-10-CM | POA: Diagnosis present

## 2018-09-04 DIAGNOSIS — K283 Acute gastrojejunal ulcer without hemorrhage or perforation: Secondary | ICD-10-CM | POA: Insufficient documentation

## 2018-09-04 DIAGNOSIS — Z122 Encounter for screening for malignant neoplasm of respiratory organs: Secondary | ICD-10-CM

## 2018-09-04 DIAGNOSIS — D509 Iron deficiency anemia, unspecified: Secondary | ICD-10-CM | POA: Insufficient documentation

## 2018-09-04 DIAGNOSIS — J449 Chronic obstructive pulmonary disease, unspecified: Secondary | ICD-10-CM | POA: Insufficient documentation

## 2018-09-04 DIAGNOSIS — Z85118 Personal history of other malignant neoplasm of bronchus and lung: Secondary | ICD-10-CM | POA: Insufficient documentation

## 2018-09-04 DIAGNOSIS — Z8673 Personal history of transient ischemic attack (TIA), and cerebral infarction without residual deficits: Secondary | ICD-10-CM | POA: Insufficient documentation

## 2018-09-04 DIAGNOSIS — Z79899 Other long term (current) drug therapy: Secondary | ICD-10-CM | POA: Insufficient documentation

## 2018-09-04 DIAGNOSIS — Z9049 Acquired absence of other specified parts of digestive tract: Secondary | ICD-10-CM | POA: Insufficient documentation

## 2018-09-04 MED ORDER — IOHEXOL 350 MG/ML SOLN
75.0000 mL | Freq: Once | INTRAVENOUS | Status: AC | PRN
  Administered 2018-09-04: 14:00:00 75 mL via INTRAVENOUS

## 2018-09-08 ENCOUNTER — Other Ambulatory Visit: Payer: Self-pay | Admitting: Hematology and Oncology

## 2018-09-08 DIAGNOSIS — R911 Solitary pulmonary nodule: Secondary | ICD-10-CM

## 2018-09-11 DIAGNOSIS — R1084 Generalized abdominal pain: Secondary | ICD-10-CM | POA: Insufficient documentation

## 2018-09-11 DIAGNOSIS — K56699 Other intestinal obstruction unspecified as to partial versus complete obstruction: Secondary | ICD-10-CM | POA: Insufficient documentation

## 2018-09-11 DIAGNOSIS — K529 Noninfective gastroenteritis and colitis, unspecified: Secondary | ICD-10-CM | POA: Insufficient documentation

## 2018-09-11 NOTE — Progress Notes (Signed)
[facility]  [address] [place], [place] [address] Phone: [phone]  Fax: [phone]   Clinic Day:  09/23/2018  Referring physician: Albina Billet, MD  Chief Complaint: Carol Shaffer is a 73 y.o. 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  HPI:  The patient was last seen in the hematology clinic on 08/20/2018 for new patient assessment.  She has a history of collagenous colitis.  She has a history of lung cancer (2012).  She described fatigue and chronic diarrhea. She denied any infections. Exam revealed no adenopathy or hepatosplenomegaly.  [facility] labs on 08/13/2018 revealed a hematocrit was 34.1, hemoglobin 9.8, MCV 88.8. Ferritin was 7. She received Feraheme.  We discussed the low dose chest CT program.  EGD on 09/01/2018 by Dr Alice Reichert revealed esophageal plaques suspicious of candidiasis.  KOH prep was negative.  Stenosed Billroth I gastroduodenostomy was found, characterized by ulceration and an intact staple line. Lesion was not amenable to dilation. Jejunum pathology revealed enteric mucosa with pyloric metaplasia. Negative for active inflammation, features of ischemia, dysplasia. Stomach, pouch revealed reactive gastropathy, negative for active inflammation, H pylori, intestinal metaplasia, dysplasia, and malignancy.   Abdomen and pelvis CT angiography on 09/04/2018 revealed normally patent mesenteric arterial blood supply without evidence of significant mesenteric arterial occlusive disease. There was mild aortoiliac atherosclerosis. Stable small left posterior diaphragmatic hernia containing fat. Stable mild dilatation of the common bile duct after prior cholecystectomy. Stable evidence of prior bowel surgery without acute findings.   Low dose chest CT on 09/04/2018 revealed lung-RADS 4BS, suspicious.  There were multiple small pulmonary  nodules noted in the lungs bilaterally. In addition, there was a 1.53 cm concerning nodule in the right lower lobe. There was diffuse bronchial wall thickening with mild centrilobular and paraseptal emphysema; imaging findings suggestive of underlying COPD.   PET scan on 09/16/2018 revealed low level FDG uptake associated with the right lower lobe lung nodule (SUV 1.57). This was nonspecific. Given the size of this nodule and the low level uptake, findings may reflect inflammatory or infectious nodule. Malignancy was not entirely excluded. Recommendation was follow-up imaging in 3 months (from 09/04/2018) with low-dose lung cancer screening nodule follow-up CT.   During the interim, the patient notes she was worried and frustrated this past week because she did not receive results from the low dose chest CT (09/04/2018) and PET scan (09/16/2018).  She notes abdominal pain which she attributes to her ulcers. She states that her body has difficulty passing food; it takes x 4-5 days before it passes. She is taking 1 gram Carafate QID and 40 mg of Protonix BID.    Past Medical History:  Diagnosis Date   Arthritis    Avascular necrosis of hip, right ([place])    Blepharospasm    Cancer ([place]) 2011   Right Upper Lobe Lobectomy   Chronic diarrhea    COPD (chronic obstructive pulmonary disease) ([place])    Diverticulosis    Hemorrhoid    History of Crohn's disease    Intestinal adhesions    Multiple gastric ulcers    Multiple gastric ulcers    Stroke [facility])     Past Surgical History:  Procedure Laterality Date   APPENDECTOMY  1989   botox injections for blepharospasm     BREAST SURGERY     CATARACT EXTRACTION     COLON RESECTION  2005   COLONOSCOPY  10-29-2008   [name]   COLONOSCOPY WITH PROPOFOL N/A 03/05/2017   Procedure: COLONOSCOPY WITH PROPOFOL;  Surgeon: Toledo, Benay Pike, MD;  Location: [facility] ENDOSCOPY;  Service: Gastroenterology;  Laterality: N/A;   COLOSTOMY REVERSAL   2006   ESOPHAGOGASTRODUODENOSCOPY (EGD) WITH PROPOFOL N/A 07/18/2016   Procedure: ESOPHAGOGASTRODUODENOSCOPY (EGD) WITH PROPOFOL;  Surgeon: Robert Bellow, MD;  Location: [facility] ENDOSCOPY;  Service: Endoscopy;  Laterality: N/A;   ESOPHAGOGASTRODUODENOSCOPY (EGD) WITH PROPOFOL N/A 08/03/2016   Procedure: ESOPHAGOGASTRODUODENOSCOPY (EGD) WITH PROPOFOL;  Surgeon: Robert Bellow, MD;  Location: [facility] ENDOSCOPY;  Service: Endoscopy;  Laterality: N/A;   ESOPHAGOGASTRODUODENOSCOPY (EGD) WITH PROPOFOL N/A 09/01/2018   Procedure: ESOPHAGOGASTRODUODENOSCOPY (EGD) WITH PROPOFOL;  Surgeon: Toledo, Benay Pike, MD;  Location: [facility] ENDOSCOPY;  Service: Gastroenterology;  Laterality: N/A;   ESOPHAGOSCOPY WITH DILITATION  2012,2015   [facility], [place]   EYE SURGERY     gastric ulcer  1989, 1991   JOINT REPLACEMENT     right total hip arthroplasty 03/03/02   LUNG CANCER SURGERY  2011   LYSIS OF ADHESION     PLACEMENT OF BREAST IMPLANTS  1973   UPPER GASTROINTESTINAL ENDOSCOPY  10-29-2008   [name]    History reviewed. No pertinent family history.  Social History:  reports that she quit smoking about 10 years ago. She quit after 25.00 years of use. She has never used smokeless tobacco. She reports current alcohol use of about 7.0 standard drinks of alcohol per week. She reports that she does not use drugs. She has a 30 pack-year smoking history (30 years x 1ppd). She quit when she was diagnosed with lung cancer in 2012. She denies any known exposure to radiations or toxins. 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 The patient is alone today.  Allergies:  Allergies  Allergen Reactions   Hydromorphone Hcl Itching   Bentyl [Dicyclomine] Nausea And Vomiting   Biaxin [Clarithromycin] Other (See Comments)    hallucinations   Erythromycin Nausea And Vomiting    Reglan [Metoclopramide] Other (See Comments)    tremors    Current Medications: Current Outpatient Medications  Medication Sig Dispense Refill   acetaminophen (TYLENOL) 500 MG tablet Take 500 mg by mouth every 6 (six) hours as needed.     brimonidine-timolol (COMBIGAN) 0.2-0.5 % ophthalmic solution INT 1 GTT IN OU BID     budesonide (ENTOCORT EC) 3 MG 24 hr capsule Take 3-6 mg by mouth daily.      ferrous sulfate 325 (65 FE) MG tablet Take 325 mg by mouth every other day.     HYDROcodone-acetaminophen (NORCO/VICODIN) 5-325 MG per tablet Take 1 tablet by mouth every 6 (six) hours as needed.      loratadine (CLARITIN) 10 MG tablet Take 10 mg by mouth daily.     Multiple Vitamin (MULTI-VITAMINS) TABS Take 1 tablet by mouth daily. once daily.     ondansetron (ZOFRAN) 4 MG tablet Take 1 tablet (4 mg total) by mouth every 8 (eight) hours as needed for nausea or vomiting. 10 tablet 0   pantoprazole (PROTONIX) 40 MG tablet Take 40 mg by mouth 2 (two) times daily.      sucralfate (CARAFATE) 1 G tablet Take 1 g by mouth 4 (four) times daily.      traZODone (DESYREL) 150 MG tablet Take 300 mg by mouth at bedtime.  venlafaxine XR (EFFEXOR-XR) 150 MG 24 hr capsule Take 150 mg by mouth daily with breakfast.      No current facility-administered medications for this visit.     Review of Systems  Constitutional: Positive for weight loss (3 lbs). Negative for chills, diaphoresis, fever and malaise/fatigue.  HENT: Negative.  Negative for congestion, hearing loss, sinus pain and sore throat.   Eyes: Negative.  Negative for blurred vision.  Respiratory: Negative.  Negative for cough, shortness of breath and wheezing.   Cardiovascular: Negative.  Negative for chest pain, palpitations, claudication, leg swelling and PND.  Gastrointestinal: Positive for abdominal pain. Negative for blood in stool, constipation, diarrhea, melena, nausea and vomiting.       Hemorrhoids. Difficulty passing food x  4-5 days.  Genitourinary: Negative.  Negative for dysuria, frequency, hematuria and urgency.  Musculoskeletal: Negative.  Negative for back pain, joint pain and myalgias.  Skin: Negative.  Negative for rash.  Neurological: Negative.  Negative for dizziness, tingling, sensory change, weakness and headaches.  Endo/Heme/Allergies: Negative for environmental allergies. Does not bruise/bleed easily.  Psychiatric/Behavioral: Negative for depression, memory loss and substance abuse. The patient is nervous/anxious (about scans). The patient does not have insomnia.   All other systems reviewed and are negative.  Performance status (ECOG): 1  Vitals Blood pressure (!) 149/78, pulse 96, temperature (!) 97.5 F (36.4 C), temperature source Tympanic, resp. rate 18, height 5' (1.524 m), weight 82 lb 12.5 oz (37.5 kg), SpO2 99 %.  Physical Exam  Constitutional: She is oriented to person, place, and time. She appears well-developed and well-nourished. No distress.  HENT:  Head: Normocephalic and atraumatic.  Short brown hair.  Mask.  Eyes: Conjunctivae and EOM are normal. No scleral icterus.  Glasses.  Blue eyes.  Musculoskeletal:        General: No edema.  Neurological: She is alert and oriented to person, place, and time.  Psychiatric: She has a normal mood and affect. Her behavior is normal. Judgment and thought content normal.  Nursing note and vitals reviewed.   No visits with results within 3 Day(s) from this visit.  Latest known visit with results is:  Hospital Outpatient Visit on 09/16/2018  Component Date Value Ref Range Status   Glucose-Capillary 09/16/2018 85  70 - 99 mg/dL Final    Assessment:  NAKIRA LITZAU is a 73 y.o. 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  IgG levels has been in the low 300s. M-protein is 0.  She has not received IVIG.  She has not had issues with recurrent infections.  She has collagenous colitis and is on budesonide.  She has a history  of peptic ulcer disease and is s/p gastrojejunostomy in 1980s. She had a small bowel obstruction and colonic perforation in the past s/p resection colostomy.She has chronic abdominal pain.  EGD on 09/01/2018 revealed esophageal plaques suspicious of candidiasis (KOH -).  There was a stenosed Billroth I gastroduodenostomy, characterized by ulceration and an intact staple line. Lesion was not amenable to dilation. Jejunum pathology revealed enteric mucosa with pyloric metaplasia and negative for active inflammation, and dysplasia. Stomach, pouch revealed reactive gastropathy, negative for active inflammation, H pylori, intestinal metaplasia, dysplasia, and malignancy.   She has a history of lung cancer in 2012 s/p right upper lobe resection.  Low dose chest CT on 09/04/2018 revealed multiple small pulmonary nodules are noted in the lungs bilaterally. In addition, there was a 1.53 cm concerning nodule in the right lower lobe. There was diffuse bronchial  wall thickening with mild centrilobular and paraseptal emphysema; imaging findings suggestive of underlying COPD.   PET scan on 09/16/2018 revealed low level FDG uptake associated with the right lower lobe lung nodule (SUV 1.57). This was a nonspecific finding. Given the size of this nodule and the low level uptake, findings may reflect inflammatory or infectious nodule. Malignancy was not entirely excluded.   She has a history of iron deficiency anemia.  She received Feraheme x2 (08/12/2017 - 08/19/2017) and 08/20/2018.  Oral iron has been ineffective in the past  Ferritin has been followed: 9 on 05/07/2017, 14 on 07/15/2017, 66 on 11/07/2017, 12 on 02/12/2018, and 7 on 08/13/2018.  Symptomatically, she notes upper abdominal discomfort associated with ulcer pain.  She is on Carafate and Protonix.  Plan: 1.   Hypogammaglobulinemia             Patient has a longstanding history of hypogammaglobulinemia.             SPEP has revealed no monoclonal  protein.             Immunology referral previously discussed. 2.   Iron deficiency             Hematocrit 34.1.  Hemoglobin 9.8.  MCV 88.8.             Ferritin was 7.             She received Feraheme on 08/20/2018.             Follow-up labs scheduled for 11/12/2018. 3.   History of lung cancer             She is s/p RUL lobectomy 2012.             Low dose chest CT on 09/04/2018 revealed multiple small pulmonary nodules bilaterally and a 1.53 cm nodule in the right lower lobe.   PET scan on 09/16/2018 revealed low level FDG uptake associated with the right lower lobe lung nodule (SUV 1.57), nnonspecific finding.   Images personally reviewed.  Agree with radiology interpretation.   Discuss plan for short term follow-up imaging. 4.   Chest CT on 12/08/2018. 5.   RTC on 12/09/2018 for MD assessment and review of chest CT.  I discussed the assessment and treatment plan with the patient.  The patient was provided an opportunity to ask questions and all were answered.  The patient agreed with the plan and demonstrated an understanding of the instructions.  The patient was advised to call back if the symptoms worsen or if the condition fails to improve as anticipated.  I provided 20 minutes of face-to-face time during this this encounter and > 50% was spent counseling as documented under my assessment and plan.    Lequita Asal, MD, PhD    09/23/2018, 9:23 AM  I, Selena Batten, am acting as scribe for [employer]. Mike Gip, MD, PhD.  I, Zamia Tyminski C. Mike Gip, MD, have reviewed the above documentation for accuracy and completeness, and I agree with the above.

## 2018-09-16 ENCOUNTER — Other Ambulatory Visit: Payer: Self-pay

## 2018-09-16 ENCOUNTER — Encounter
Admission: RE | Admit: 2018-09-16 | Discharge: 2018-09-16 | Disposition: A | Discharge status: Home / Self Care | Payer: Medicare Other | Source: Ambulatory Visit | Attending: Hematology and Oncology | Admitting: Hematology and Oncology

## 2018-09-16 DIAGNOSIS — Z8673 Personal history of transient ischemic attack (TIA), and cerebral infarction without residual deficits: Secondary | ICD-10-CM | POA: Diagnosis not present

## 2018-09-16 DIAGNOSIS — R911 Solitary pulmonary nodule: Secondary | ICD-10-CM | POA: Insufficient documentation

## 2018-09-16 DIAGNOSIS — I700 Atherosclerosis of aorta: Secondary | ICD-10-CM | POA: Diagnosis not present

## 2018-09-16 DIAGNOSIS — Z87891 Personal history of nicotine dependence: Secondary | ICD-10-CM | POA: Insufficient documentation

## 2018-09-16 DIAGNOSIS — Z85118 Personal history of other malignant neoplasm of bronchus and lung: Secondary | ICD-10-CM | POA: Insufficient documentation

## 2018-09-16 DIAGNOSIS — Z79899 Other long term (current) drug therapy: Secondary | ICD-10-CM | POA: Diagnosis not present

## 2018-09-16 DIAGNOSIS — J439 Emphysema, unspecified: Secondary | ICD-10-CM | POA: Insufficient documentation

## 2018-09-16 LAB — GLUCOSE, CAPILLARY: Glucose-Capillary: 85 mg/dL (ref 70–99)

## 2018-09-16 IMAGING — PT NUCLEAR MEDICINE PET IMAGE INITIAL (PI) SKULL BASE TO THIGH
1 series · 1 of 1 positions shown · non-contrast
Comparison: CT chest 09/04/2018

CLINICAL DATA: Initial treatment strategy for right lower lobe lung
nodule.

EXAM:
NUCLEAR MEDICINE PET SKULL BASE TO THIGH
TECHNIQUE: 5.48 mCi F-18 FDG was injected intravenously. Full-ring PET imaging
was performed from the skull base to thigh after the radiotracer. CT
data was obtained and used for attenuation correction and anatomic
localization.
Fasting blood glucose: 85 mg/dl

[Series 1058: results mm oncology reading · 1.0mm · 0.50mm/px · 1 of 1 slices shown]
[im 1/1]
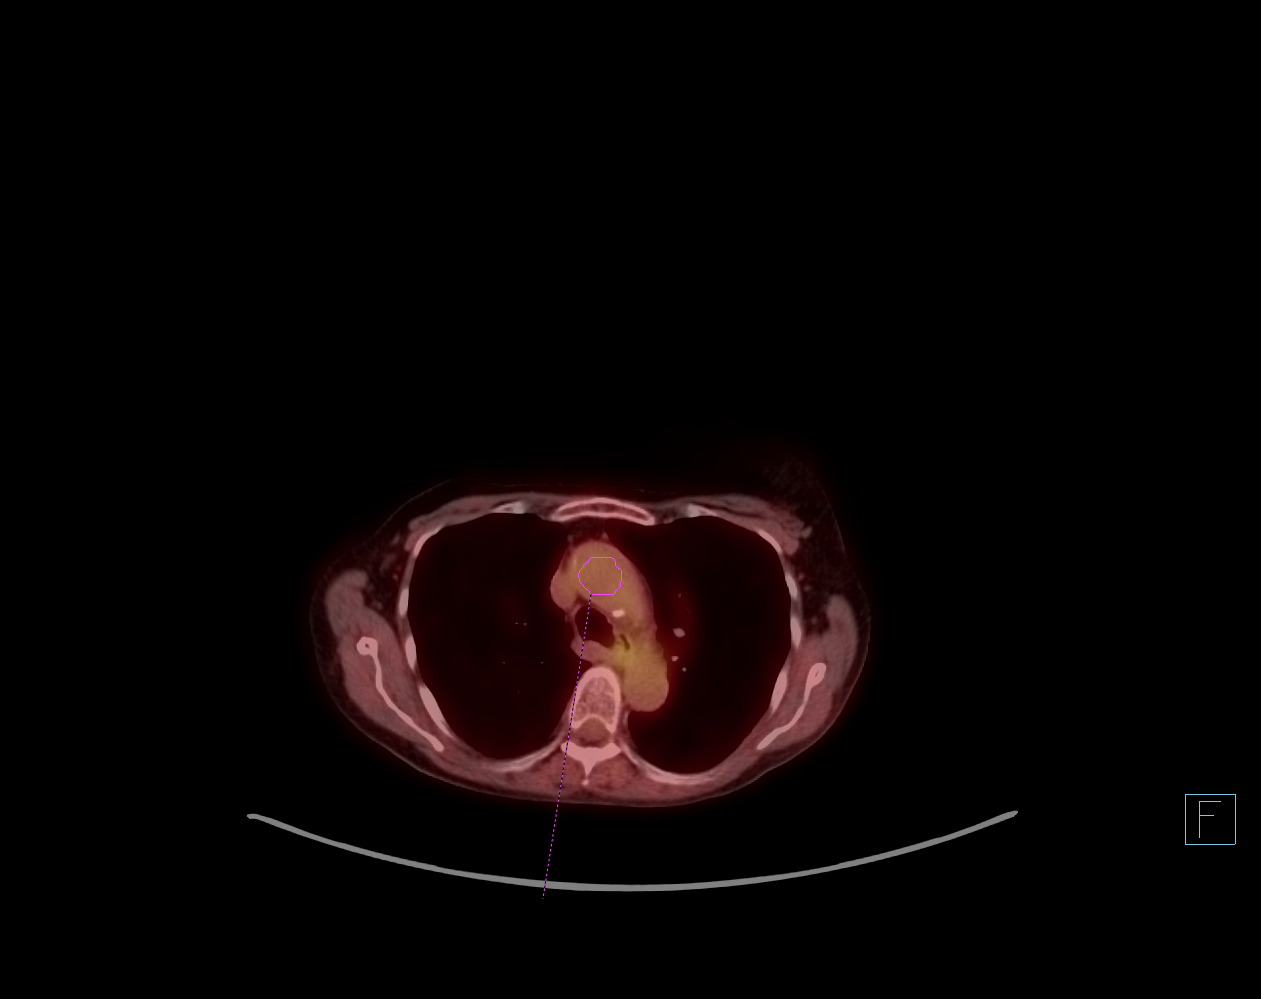

[1 of 1 positions shown; findings below may reference images not displayed]

FINDINGS: Mediastinal blood pool activity: SUV max

Liver activity: SUV max NA

NECK: No hypermetabolic lymph nodes in the neck.

Incidental CT findings: none

CHEST: No hypermetabolic mediastinal or hilar lymph nodes. No
hypermetabolic axillary, supraclavicular, mediastinal or hilar lymph
nodes. The pulmonary nodule identified within the right lower lobe
is again noted. This measures approximately 1.7 x 1.0 cm with SUV
max of 1.57. No additional pulmonary nodules or masses identified.

Incidental CT findings: Emphysema.  Aortic atherosclerosis.

ABDOMEN/PELVIS: No abnormal hypermetabolic activity within the
liver, pancreas, adrenal glands, or spleen. No hypermetabolic lymph
nodes in the abdomen or pelvis. Multiple cysts identified within the
mid and upper pole of left kidney.

Incidental CT findings: Aortic atherosclerosis.

SKELETON: No focal hypermetabolic activity to suggest skeletal
metastasis.

Incidental CT findings: Status post bilateral breast augmentation
with capsular calcifications.
IMPRESSION: 1. There is low level FDG uptake associated with the right lower
lobe lung nodule which has an SUV max of 1.57. This is a nonspecific
finding. Given the size of this nodule and the low level uptake
findings may reflect inflammatory or infectious nodule. Malignancy
is not entirely excluded. Recommend follow-up imaging in 3 months
(from 09/04/2018) with low-dose lung cancer screening nodule
follow-up CT.
2. Aortic Atherosclerosis (VW6JP-KN3.3) and Emphysema ([id number].H).

## 2018-09-16 MED ORDER — FLUDEOXYGLUCOSE F - 18 (FDG) INJECTION
5.4800 | Freq: Once | INTRAVENOUS | Status: AC | PRN
  Administered 2018-09-16: 10:00:00 5.48 via INTRAVENOUS

## 2018-09-17 DIAGNOSIS — K56699 Other intestinal obstruction unspecified as to partial versus complete obstruction: Secondary | ICD-10-CM | POA: Insufficient documentation

## 2018-09-17 DIAGNOSIS — R634 Abnormal weight loss: Secondary | ICD-10-CM | POA: Insufficient documentation

## 2018-09-22 NOTE — Progress Notes (Signed)
Confirmed Name, DOB, and Address. Reports she has been having nausea/vomiting occasionally with taking Ferrous Sulfate d/t ulcers. Patient is currently under care of GI for this problem. Denies any concerns.

## 2018-09-23 ENCOUNTER — Encounter: Payer: Self-pay | Admitting: Hematology and Oncology

## 2018-09-23 ENCOUNTER — Inpatient Hospital Stay (HOSPITAL_BASED_OUTPATIENT_CLINIC_OR_DEPARTMENT_OTHER): Payer: Medicare Other | Admitting: Hematology and Oncology

## 2018-09-23 ENCOUNTER — Other Ambulatory Visit: Payer: Self-pay

## 2018-09-23 VITALS — BP 149/78 | HR 96 | Temp 97.5°F | Resp 18 | Ht 60.0 in | Wt 82.8 lb

## 2018-09-23 DIAGNOSIS — D509 Iron deficiency anemia, unspecified: Secondary | ICD-10-CM

## 2018-09-23 DIAGNOSIS — Z9049 Acquired absence of other specified parts of digestive tract: Secondary | ICD-10-CM | POA: Diagnosis not present

## 2018-09-23 DIAGNOSIS — Z87891 Personal history of nicotine dependence: Secondary | ICD-10-CM | POA: Diagnosis not present

## 2018-09-23 DIAGNOSIS — R911 Solitary pulmonary nodule: Secondary | ICD-10-CM | POA: Diagnosis not present

## 2018-09-23 DIAGNOSIS — Z933 Colostomy status: Secondary | ICD-10-CM | POA: Diagnosis not present

## 2018-09-23 DIAGNOSIS — Z79899 Other long term (current) drug therapy: Secondary | ICD-10-CM | POA: Diagnosis not present

## 2018-09-23 DIAGNOSIS — D801 Nonfamilial hypogammaglobulinemia: Secondary | ICD-10-CM

## 2018-09-23 DIAGNOSIS — Z85118 Personal history of other malignant neoplasm of bronchus and lung: Secondary | ICD-10-CM | POA: Insufficient documentation

## 2018-09-23 DIAGNOSIS — J449 Chronic obstructive pulmonary disease, unspecified: Secondary | ICD-10-CM | POA: Diagnosis not present

## 2018-09-23 DIAGNOSIS — Z8673 Personal history of transient ischemic attack (TIA), and cerebral infarction without residual deficits: Secondary | ICD-10-CM | POA: Diagnosis not present

## 2018-09-23 NOTE — Progress Notes (Signed)
The patient c/o new pain noted to her upper abdomen / new ulcer with pain noted ( pain today 6)

## 2018-11-12 ENCOUNTER — Other Ambulatory Visit: Payer: Self-pay

## 2018-11-12 ENCOUNTER — Inpatient Hospital Stay: Payer: Medicare Other | Attending: Hematology and Oncology

## 2018-11-12 DIAGNOSIS — D801 Nonfamilial hypogammaglobulinemia: Secondary | ICD-10-CM

## 2018-11-12 DIAGNOSIS — Z85118 Personal history of other malignant neoplasm of bronchus and lung: Secondary | ICD-10-CM | POA: Insufficient documentation

## 2018-11-12 DIAGNOSIS — D508 Other iron deficiency anemias: Secondary | ICD-10-CM | POA: Diagnosis not present

## 2018-11-12 DIAGNOSIS — D509 Iron deficiency anemia, unspecified: Secondary | ICD-10-CM

## 2018-11-12 LAB — CBC WITH DIFFERENTIAL/PLATELET
Abs Immature Granulocytes: 0.03 10*3/uL (ref 0.00–0.07)
Basophils Absolute: 0.1 10*3/uL (ref 0.0–0.1)
Basophils Relative: 1 %
Eosinophils Absolute: 0.2 10*3/uL (ref 0.0–0.5)
Eosinophils Relative: 4 %
HCT: 37.9 % (ref 36.0–46.0)
Hemoglobin: 11.9 g/dL — ABNORMAL LOW (ref 12.0–15.0)
Immature Granulocytes: 1 %
Lymphocytes Relative: 16 %
Lymphs Abs: 1 10*3/uL (ref 0.7–4.0)
MCH: 27.6 pg (ref 26.0–34.0)
MCHC: 31.4 g/dL (ref 30.0–36.0)
MCV: 87.9 fL (ref 80.0–100.0)
Monocytes Absolute: 0.6 10*3/uL (ref 0.1–1.0)
Monocytes Relative: 10 %
Neutro Abs: 4.4 10*3/uL (ref 1.7–7.7)
Neutrophils Relative %: 68 %
Platelets: 385 10*3/uL (ref 150–400)
RBC: 4.31 MIL/uL (ref 3.87–5.11)
RDW: 15.9 % — ABNORMAL HIGH (ref 11.5–15.5)
WBC: 6.3 10*3/uL (ref 4.0–10.5)
nRBC: 0 % (ref 0.0–0.2)

## 2018-11-12 LAB — IRON AND TIBC
Iron: 47 ug/dL (ref 28–170)
Saturation Ratios: 14 % (ref 10.4–31.8)
TIBC: 345 ug/dL (ref 250–450)
UIBC: 298 ug/dL

## 2018-11-12 LAB — FERRITIN: Ferritin: 9 ng/mL — ABNORMAL LOW (ref 11–307)

## 2018-11-12 LAB — FOLATE: Folate: 39 ng/mL (ref >5.9)

## 2018-11-12 LAB — VITAMIN B12: Vitamin B-12: 1409 pg/mL — ABNORMAL HIGH (ref 180–914)

## 2018-11-13 ENCOUNTER — Telehealth: Payer: Self-pay

## 2018-11-13 NOTE — Telephone Encounter (Signed)
Contacted patient and informed her of low Ferritin level. Advised that Dr. Mike Gip would like for her to receive Feraheme. Patient is agreeable. Transferred to [facility] to schedule appt.

## 2018-11-13 NOTE — Telephone Encounter (Signed)
-----   Message from Lequita Asal, MD sent at 11/12/2018  3:04 PM EDT ----- Regarding: She will need Feraheme  ----- Message ----- From: Buel Ream, Lab In [place] Sent: 11/12/2018   9:20 AM EDT To: Lequita Asal, MD

## 2018-11-18 ENCOUNTER — Inpatient Hospital Stay: Payer: Medicare Other

## 2018-11-18 ENCOUNTER — Other Ambulatory Visit: Payer: Self-pay

## 2018-11-18 VITALS — BP 117/74 | HR 82 | Temp 98.0°F | Resp 18

## 2018-11-18 DIAGNOSIS — D509 Iron deficiency anemia, unspecified: Secondary | ICD-10-CM

## 2018-11-18 DIAGNOSIS — D508 Other iron deficiency anemias: Secondary | ICD-10-CM | POA: Diagnosis not present

## 2018-11-18 MED ORDER — SODIUM CHLORIDE 0.9 % IV SOLN
510.0000 mg | Freq: Once | INTRAVENOUS | Status: AC
  Administered 2018-11-18: 510 mg via INTRAVENOUS
  Filled 2018-11-18: qty 17

## 2018-11-18 MED ORDER — SODIUM CHLORIDE 0.9 % IV SOLN
Freq: Once | INTRAVENOUS | Status: AC
  Administered 2018-11-18: 11:00:00 via INTRAVENOUS
  Filled 2018-11-18: qty 250

## 2018-11-18 NOTE — Patient Instructions (Signed)

## 2018-11-25 ENCOUNTER — Ambulatory Visit: Payer: Medicare Other

## 2018-11-27 ENCOUNTER — Other Ambulatory Visit: Payer: Self-pay

## 2018-11-27 ENCOUNTER — Other Ambulatory Visit
Admission: RE | Admit: 2018-11-27 | Discharge: 2018-11-27 | Disposition: A | Discharge status: Home / Self Care | Payer: Medicare Other | Source: Ambulatory Visit | Attending: Internal Medicine | Admitting: Internal Medicine

## 2018-11-27 DIAGNOSIS — Z20828 Contact with and (suspected) exposure to other viral communicable diseases: Secondary | ICD-10-CM | POA: Diagnosis not present

## 2018-11-27 DIAGNOSIS — Z01812 Encounter for preprocedural laboratory examination: Secondary | ICD-10-CM | POA: Diagnosis present

## 2018-11-27 LAB — SARS CORONAVIRUS 2 (TAT 6-24 HRS): SARS Coronavirus 2: NEGATIVE

## 2018-11-28 ENCOUNTER — Encounter: Payer: Self-pay | Admitting: *Deleted

## 2018-12-01 ENCOUNTER — Encounter
Admission: RE | Disposition: A | Discharge status: Home / Self Care | Payer: Self-pay | Source: Home / Self Care | Attending: Internal Medicine

## 2018-12-01 ENCOUNTER — Ambulatory Visit
Admission: RE | Admit: 2018-12-01 | Discharge: 2018-12-01 | Disposition: A | Discharge status: Home / Self Care | Payer: Medicare Other | Attending: Internal Medicine | Admitting: Internal Medicine

## 2018-12-01 ENCOUNTER — Ambulatory Visit: Payer: Medicare Other | Admitting: Anesthesiology

## 2018-12-01 ENCOUNTER — Encounter: Payer: Self-pay | Admitting: *Deleted

## 2018-12-01 DIAGNOSIS — K277 Chronic peptic ulcer, site unspecified, without hemorrhage or perforation: Secondary | ICD-10-CM | POA: Insufficient documentation

## 2018-12-01 DIAGNOSIS — Z7982 Long term (current) use of aspirin: Secondary | ICD-10-CM | POA: Insufficient documentation

## 2018-12-01 DIAGNOSIS — Z881 Allergy status to other antibiotic agents status: Secondary | ICD-10-CM | POA: Insufficient documentation

## 2018-12-01 DIAGNOSIS — M1990 Unspecified osteoarthritis, unspecified site: Secondary | ICD-10-CM | POA: Diagnosis not present

## 2018-12-01 DIAGNOSIS — Z96641 Presence of right artificial hip joint: Secondary | ICD-10-CM | POA: Insufficient documentation

## 2018-12-01 DIAGNOSIS — J449 Chronic obstructive pulmonary disease, unspecified: Secondary | ICD-10-CM | POA: Insufficient documentation

## 2018-12-01 DIAGNOSIS — Z79899 Other long term (current) drug therapy: Secondary | ICD-10-CM | POA: Insufficient documentation

## 2018-12-01 DIAGNOSIS — Z888 Allergy status to other drugs, medicaments and biological substances status: Secondary | ICD-10-CM | POA: Diagnosis not present

## 2018-12-01 DIAGNOSIS — K287 Chronic gastrojejunal ulcer without hemorrhage or perforation: Secondary | ICD-10-CM | POA: Insufficient documentation

## 2018-12-01 DIAGNOSIS — Y832 Surgical operation with anastomosis, bypass or graft as the cause of abnormal reaction of the patient, or of later complication, without mention of misadventure at the time of the procedure: Secondary | ICD-10-CM | POA: Diagnosis not present

## 2018-12-01 DIAGNOSIS — T85858A Stenosis due to other internal prosthetic devices, implants and grafts, initial encounter: Secondary | ICD-10-CM | POA: Insufficient documentation

## 2018-12-01 DIAGNOSIS — K5090 Crohn's disease, unspecified, without complications: Secondary | ICD-10-CM | POA: Diagnosis not present

## 2018-12-01 DIAGNOSIS — Z87891 Personal history of nicotine dependence: Secondary | ICD-10-CM | POA: Insufficient documentation

## 2018-12-01 DIAGNOSIS — Z8673 Personal history of transient ischemic attack (TIA), and cerebral infarction without residual deficits: Secondary | ICD-10-CM | POA: Diagnosis not present

## 2018-12-01 DIAGNOSIS — Z980 Intestinal bypass and anastomosis status: Secondary | ICD-10-CM | POA: Insufficient documentation

## 2018-12-01 HISTORY — DX: Collagenous colitis: K52.831

## 2018-12-01 HISTORY — DX: Iron deficiency anemia, unspecified: D50.9

## 2018-12-01 HISTORY — DX: Adult hypertrophic pyloric stenosis: K31.1

## 2018-12-01 HISTORY — DX: Other intestinal obstruction unspecified as to partial versus complete obstruction: K56.699

## 2018-12-01 HISTORY — PX: ESOPHAGOGASTRODUODENOSCOPY (EGD) WITH PROPOFOL: SHX5813

## 2018-12-01 SURGERY — ESOPHAGOGASTRODUODENOSCOPY (EGD) WITH PROPOFOL
Anesthesia: General

## 2018-12-01 MED ORDER — PROPOFOL 10 MG/ML IV BOLUS
INTRAVENOUS | Status: DC | PRN
  Administered 2018-12-01: 100 mg via INTRAVENOUS
  Administered 2018-12-01: 10 mg via INTRAVENOUS

## 2018-12-01 MED ORDER — LIDOCAINE HCL (PF) 2 % IJ SOLN
INTRAMUSCULAR | Status: DC | PRN
  Administered 2018-12-01: 100 mg via INTRADERMAL

## 2018-12-01 MED ORDER — PROPOFOL 500 MG/50ML IV EMUL
INTRAVENOUS | Status: DC | PRN
  Administered 2018-12-01: 200 ug/kg/min via INTRAVENOUS

## 2018-12-01 MED ORDER — SODIUM CHLORIDE 0.9 % IV SOLN
INTRAVENOUS | Status: DC
  Administered 2018-12-01: 09:00:00 1000 mL via INTRAVENOUS

## 2018-12-01 NOTE — Interval H&P Note (Signed)
History and Physical Interval Note:  12/01/2018 8:16 AM  Carol Shaffer  has presented today for surgery, with the diagnosis of STENOSIS.  The various methods of treatment have been discussed with the patient and family. After consideration of risks, benefits and other options for treatment, the patient has consented to  Procedure(s): ESOPHAGOGASTRODUODENOSCOPY (EGD) WITH PROPOFOL (N/A) as a surgical intervention.  The patient's history has been reviewed, patient examined, no change in status, stable for surgery.  I have reviewed the patient's chart and labs.  Questions were answered to the patient's satisfaction.     [place], [place]

## 2018-12-01 NOTE — Anesthesia Preprocedure Evaluation (Addendum)
Anesthesia Evaluation  Patient identified by MRN, date of birth, ID band Patient awake    Reviewed: Allergy & Precautions, H&P , NPO status , Patient's Chart, lab work & pertinent test results  Airway Mallampati: II  TM Distance: >3 FB     Dental  (+) Teeth Intact   Pulmonary COPD, former smoker,           Cardiovascular negative cardio ROS       Neuro/Psych CVA negative psych ROS   GI/Hepatic Neg liver ROS, PUD,   Endo/Other  negative endocrine ROS  Renal/GU negative Renal ROS  negative genitourinary   Musculoskeletal   Abdominal   Peds  Hematology  (+) Blood dyscrasia, anemia ,   Anesthesia Other Findings Past Medical History: No date: Arthritis No date: Avascular necrosis of hip, right (HCC) No date: Blepharospasm 2011: Cancer ([place])     Comment:  Right Upper Lobe Lobectomy No date: Chronic diarrhea No date: Chronic diarrhea No date: Collagenous colitis No date: COPD (chronic obstructive pulmonary disease) (HCC) No date: Diverticulosis No date: Gastric outlet obstruction No date: Hemorrhoid No date: History of Crohn's disease No date: IDA (iron deficiency anemia) No date: Intestinal adhesions No date: Multiple gastric ulcers No date: Multiple gastric ulcers No date: 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[place]) No date: Stroke [facility])  Past Surgical History: 1989: APPENDECTOMY No date: botox injections for blepharospasm No date: BREAST SURGERY No date: CATARACT EXTRACTION 2005: COLON RESECTION 10-29-2008: COLONOSCOPY     Comment:  [name] 03/05/2017: COLONOSCOPY WITH PROPOFOL; N/A     Comment:  Procedure: COLONOSCOPY WITH PROPOFOL;  Surgeon: [place],               Benay Pike, MD;  Location: [facility] ENDOSCOPY;  Service:               Gastroenterology;  Laterality: N/A; 2006: COLOSTOMY REVERSAL 07/18/2016: ESOPHAGOGASTRODUODENOSCOPY (EGD) WITH PROPOFOL; N/A     Comment:  Procedure:  ESOPHAGOGASTRODUODENOSCOPY (EGD) WITH               PROPOFOL;  Surgeon: Robert Bellow, MD;  Location:               [facility] ENDOSCOPY;  Service: Endoscopy;  Laterality: N/A; 08/03/2016: ESOPHAGOGASTRODUODENOSCOPY (EGD) WITH PROPOFOL; N/A     Comment:  Procedure: ESOPHAGOGASTRODUODENOSCOPY (EGD) WITH               PROPOFOL;  Surgeon: Robert Bellow, MD;  Location:               [facility] ENDOSCOPY;  Service: Endoscopy;  Laterality: N/A; 09/01/2018: ESOPHAGOGASTRODUODENOSCOPY (EGD) WITH PROPOFOL; N/A     Comment:  Procedure: ESOPHAGOGASTRODUODENOSCOPY (EGD) WITH               PROPOFOL;  Surgeon: [place], Benay Pike, MD;  Location:               [facility] ENDOSCOPY;  Service: Gastroenterology;  Laterality:               N/A; [id number]: ESOPHAGOSCOPY WITH DILITATION     Comment:  [facility], [place] No date: EYE SURGERY No date: GASTRECTOMY 1989, 1991: gastric ulcer No date: JOINT REPLACEMENT     Comment:  right total hip arthroplasty 03/03/02 No date: LAPAROSCOPIC LYSIS OF ADHESIONS 2011: LUNG CANCER SURGERY No date: LYSIS OF ADHESION 1973: PLACEMENT OF BREAST IMPLANTS No date: thoracotomy with right upper lobectomy and central  compartment node dissection 10-29-2008: UPPER GASTROINTESTINAL ENDOSCOPY     Comment:  Dr  Byrnett     Reproductive/Obstetrics negative OB ROS                            Anesthesia Physical Anesthesia Plan  ASA: II  Anesthesia Plan: General   Post-op Pain Management:    Induction:   PONV Risk Score and Plan: Propofol infusion and TIVA  Airway Management Planned: Natural Airway and Nasal Cannula  Additional Equipment:   Intra-op Plan:   Post-operative Plan:   Informed Consent: I have reviewed the patients History and Physical, chart, labs and discussed the procedure including the risks, benefits and alternatives for the proposed anesthesia with the patient or authorized representative who has indicated his/her understanding and acceptance.      Dental Advisory Given  Plan Discussed with: Anesthesiologist, CRNA and Surgeon  Anesthesia Plan Comments:         Anesthesia Quick Evaluation

## 2018-12-01 NOTE — Transfer of Care (Signed)
Immediate Anesthesia Transfer of Care Note  Patient: Carol Shaffer  Procedure(s) Performed: ESOPHAGOGASTRODUODENOSCOPY (EGD) WITH PROPOFOL (N/A )  Patient Location: PACU  Anesthesia Type:General  Level of Consciousness: sedated  Airway & Oxygen Therapy: Patient Spontanous Breathing and Patient connected to nasal cannula oxygen  Post-op Assessment: Report given to RN and Post -op Vital signs reviewed and stable  Post vital signs: Reviewed and stable  Last Vitals:  Vitals Value Taken Time  BP 128/74 12/01/18 0909  Temp    Pulse 82 12/01/18 0911  Resp 22 12/01/18 0911  SpO2 99 % 12/01/18 0911  Vitals shown include unvalidated device data.  Last Pain:  Vitals:   12/01/18 0824  TempSrc: Skin         Complications: No apparent anesthesia complications

## 2018-12-01 NOTE — Anesthesia Postprocedure Evaluation (Signed)
Anesthesia Post Note  Patient: Carol Shaffer  Procedure(s) Performed: ESOPHAGOGASTRODUODENOSCOPY (EGD) WITH PROPOFOL (N/A )  Patient location during evaluation: PACU Anesthesia Type: General Level of consciousness: awake and alert Pain management: pain level controlled Vital Signs Assessment: post-procedure vital signs reviewed and stable Respiratory status: spontaneous breathing, nonlabored ventilation and respiratory function stable Cardiovascular status: blood pressure returned to baseline and stable Postop Assessment: no apparent nausea or vomiting Anesthetic complications: no     Last Vitals:  Vitals:   12/01/18 0919 12/01/18 0929  BP: 102/64 102/68  Pulse: 84 79  Resp: 12 13  Temp:    SpO2: 96% 95%    Last Pain:  Vitals:   12/01/18 0919  TempSrc:   PainSc: 0-No pain                 Durenda Hurt

## 2018-12-01 NOTE — Anesthesia Post-op Follow-up Note (Signed)
Anesthesia QCDR form completed.        

## 2018-12-01 NOTE — H&P (Signed)
Outpatient short stay form Pre-procedure 12/01/2018 8:15 AM  K. Alice Reichert, M.D.  Primary Physician: Benita Stabile, M.D.  Reason for visit:  Gastric outlet obstruction, stenosis of billroth II anastomosis  History of present illness:  As above. Patient denies change in bowel habits, rectal bleeding, weight loss or abdominal pain.     Current Facility-Administered Medications:  .  0.9 %  sodium chloride infusion, , Intravenous, Continuous, , Benay Pike, MD  Medications Prior to Admission  Medication Sig Dispense Refill Last Dose  . aspirin EC 81 MG tablet Take 81 mg by mouth daily.     Marland Kitchen acetaminophen (TYLENOL) 500 MG tablet Take 500 mg by mouth every 6 (six) hours as needed.     . brimonidine-timolol (COMBIGAN) 0.2-0.5 % ophthalmic solution INT 1 GTT IN OU BID     . budesonide (ENTOCORT EC) 3 MG 24 hr capsule Take 3-6 mg by mouth daily.      . ferrous sulfate 325 (65 FE) MG tablet Take 325 mg by mouth every other day.     Marland Kitchen HYDROcodone-acetaminophen (NORCO/VICODIN) 5-325 MG per tablet Take 1 tablet by mouth every 6 (six) hours as needed.      . loratadine (CLARITIN) 10 MG tablet Take 10 mg by mouth daily.     . Multiple Vitamin (MULTI-VITAMINS) TABS Take 1 tablet by mouth daily. once daily.     . ondansetron (ZOFRAN) 4 MG tablet Take 1 tablet (4 mg total) by mouth every 8 (eight) hours as needed for nausea or vomiting. 10 tablet 0   . pantoprazole (PROTONIX) 40 MG tablet Take 40 mg by mouth 2 (two) times daily.      . sucralfate (CARAFATE) 1 G tablet Take 1 g by mouth 4 (four) times daily.      . traZODone (DESYREL) 150 MG tablet Take 300 mg by mouth at bedtime.      Marland Kitchen venlafaxine XR (EFFEXOR-XR) 150 MG 24 hr capsule Take 150 mg by mouth daily with breakfast.         Allergies  Allergen Reactions  . Hydromorphone Hcl Itching  . Bentyl [Dicyclomine] Nausea And Vomiting  . Biaxin [Clarithromycin] Other (See Comments)    hallucinations  . Erythromycin Nausea And Vomiting  .  Reglan [Metoclopramide] Other (See Comments)    tremors     Past Medical History:  Diagnosis Date  . Arthritis   . Avascular necrosis of hip, right ([place])   . Blepharospasm   . Cancer [facility]) 2011   Right Upper Lobe Lobectomy  . Chronic diarrhea   . Chronic diarrhea   . Collagenous colitis   . COPD (chronic obstructive pulmonary disease) ([place])   . Diverticulosis   . Gastric outlet obstruction   . Hemorrhoid   . History of Crohn's disease   . IDA (iron deficiency anemia)   . Intestinal adhesions   . Multiple gastric ulcers   . Multiple gastric ulcers   . 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[place])   . Stroke [facility])     Review of systems:  Otherwise negative.    Physical Exam  Gen: Alert, oriented. Appears stated age.  HEENT: [place]/AT. PERRLA. Lungs: CTA, no wheezes. CV: RR nl S1, S2. Abd: soft, benign, no masses. BS+ Ext: No edema. Pulses 2+    Planned procedures: Proceed with EGD. The patient understands the nature of the planned procedure, indications, risks, alternatives and potential complications including but not limited to bleeding, infection, perforation, damage to internal organs and possible oversedation/side effects from  anesthesia. The patient agrees and gives consent to proceed.  Please refer to procedure notes for findings, recommendations and patient disposition/instructions.      K. Alice Reichert, M.D. Gastroenterology 12/01/2018  8:15 AM

## 2018-12-01 NOTE — Op Note (Signed)
[facility] Gastroenterology Patient Name: Carol Shaffer Procedure Date: 12/01/2018 8:42 AM MRN: [id number] Account #: [id number] Date of Birth: Feb 25, 1945 Admit Type: Outpatient Age: 73 Room: [facility] ENDO ROOM 1 Gender: Female Note Status: Finalized Procedure:            Upper GI endoscopy Indications:          Post-surgical anastomotic stenosis, Chronic peptic                        ulcer with obstruction, Chronic gastrojejunal ulcer                        with obstruction Providers:            Benay Pike. [place] MD, MD Medicines:            Propofol per Anesthesia Complications:        No immediate complications. Procedure:            Pre-Anesthesia Assessment:                       - The risks and benefits of the procedure and the                        sedation options and risks were discussed with the                        patient. All questions were answered and informed                        consent was obtained.                       - Patient identification and proposed procedure were                        verified prior to the procedure by the nurse. The                        procedure was verified in the procedure room.                       - ASA Grade Assessment: III - A patient with severe                        systemic disease.                       - After reviewing the risks and benefits, the patient                        was deemed in satisfactory condition to undergo the                        procedure.                       After obtaining informed consent, the endoscope was                        passed under direct vision. Throughout the procedure,  the patient's blood pressure, pulse, and oxygen                        saturations were monitored continuously. The Endoscope                        was introduced through the mouth, and advanced to the                        jejunum. The upper GI endoscopy was somewhat  difficult                        due to presence of food. The patient tolerated the                        procedure well. Findings:      The examined esophagus was normal.      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 A gastric       pouch with a normal size was found containing bile and food debris. The       gastroduodenal anastomosis was characterized by erythema. This was       traversed. Biopsies were taken with a cold forceps for Helicobacter       pylori testing.      A 1 cm hiatal hernia was present.      Patchy mild inflammation, characterized by erosions and friability was       found in the jejunum.      A few mild benign-appearing, intrinsic stenoses were noted that were       traversed and found in the jejunum. Estimated blood loss: none. Impression:           - Normal esophagus.                       - Patent Billroth I gastroduodenostomy was found,                        characterized by erythema. Biopsied.                       - 1 cm hiatal hernia.                       - Suspected jejunal inflammation characterized by                        erosions and friability.                       - Jejunal stenosis. Recommendation:       - Patient has a contact number available for                        emergencies. The signs and symptoms of potential                        delayed complications were discussed with the patient.                        Return to normal activities tomorrow. Written discharge  instructions were provided to the patient.                       - Gastroparesis diet.                       - Continue present medications.                       - Await pathology results.                       - Repeat upper endoscopy PRN for retreatment.                       - Return to physician assistant in 3 months.                       - The findings and recommendations were discussed with                        the  patient.                       - Patient off Budesonide due to stomach upset. Was                        advised she could resume at her convenience if                        tolerated.                       - The findings and recommendations were discussed with                        the patient. Procedure Code(s):    --- Professional ---                       [phone], Esophagogastroduodenoscopy, flexible, transoral;                        with biopsy, single or multiple Diagnosis Code(s):    --- Professional ---                       K28.7, Chronic gastrojejunal ulcer without hemorrhage                        or perforation                       K27.7, Chronic peptic ulcer, site unspecified, without                        hemorrhage or perforation                       K91.89, Other postprocedural complications and                        disorders of digestive system                       K56.699, Other intestinal obstruction unspecified as to  partial versus complete obstruction                       K63.89, Other specified diseases of intestine                       K28.9, Gastrojejunal ulcer, unspecified as acute or                        chronic, without hemorrhage or perforation                       K44.9, Diaphragmatic hernia without obstruction or                        gangrene                       Z98.0, Intestinal bypass and anastomosis status CPT copyright 2019 [employer]. All rights reserved. The codes documented in this report are preliminary and upon coder review may  be revised to meet current compliance requirements. Efrain Sella MD, MD 12/01/2018 9:14:24 AM This report has been signed electronically. Number of Addenda: 0 Note Initiated On: 12/01/2018 8:42 AM Estimated Blood Loss: Estimated blood loss was minimal.      [facility]

## 2018-12-01 NOTE — Anesthesia Procedure Notes (Signed)
Date/Time: 12/01/2018 9:02 AM Performed by: Nelda Marseille, CRNA Pre-anesthesia Checklist: Patient identified, Emergency Drugs available, Suction available, Patient being monitored and Timeout performed Oxygen Delivery Method: Nasal cannula

## 2018-12-02 ENCOUNTER — Encounter: Payer: Self-pay | Admitting: Internal Medicine

## 2018-12-02 LAB — SURGICAL PATHOLOGY

## 2018-12-04 ENCOUNTER — Other Ambulatory Visit: Payer: Self-pay

## 2018-12-04 ENCOUNTER — Ambulatory Visit
Admission: RE | Admit: 2018-12-04 | Discharge: 2018-12-04 | Disposition: A | Discharge status: Home / Self Care | Payer: Medicare Other | Source: Ambulatory Visit | Attending: Hematology and Oncology | Admitting: Hematology and Oncology

## 2018-12-04 DIAGNOSIS — Z85118 Personal history of other malignant neoplasm of bronchus and lung: Secondary | ICD-10-CM | POA: Diagnosis present

## 2018-12-04 DIAGNOSIS — J439 Emphysema, unspecified: Secondary | ICD-10-CM | POA: Diagnosis not present

## 2018-12-04 DIAGNOSIS — I2510 Atherosclerotic heart disease of native coronary artery without angina pectoris: Secondary | ICD-10-CM | POA: Insufficient documentation

## 2018-12-04 DIAGNOSIS — Z87891 Personal history of nicotine dependence: Secondary | ICD-10-CM | POA: Insufficient documentation

## 2018-12-04 DIAGNOSIS — R911 Solitary pulmonary nodule: Secondary | ICD-10-CM | POA: Diagnosis present

## 2018-12-04 DIAGNOSIS — Z122 Encounter for screening for malignant neoplasm of respiratory organs: Secondary | ICD-10-CM | POA: Insufficient documentation

## 2018-12-04 DIAGNOSIS — I700 Atherosclerosis of aorta: Secondary | ICD-10-CM | POA: Insufficient documentation

## 2018-12-07 NOTE — Progress Notes (Signed)
[facility]  [address], [address] [place], [place] [address] Phone: [phone]  Fax: [phone]   Clinic Day:  12/09/2018  Referring physician: Albina Billet, MD  Chief Complaint: Carol Shaffer is a 73 y.o. female with hyopgammaglobulinemiaand iron deficiency anemia who is seen for 3 month assessment and review of interval chest CT.   HPI: The patient was last seen in the hematology clinic on 09/23/2018. At that time, she noted upper abdominal discomfort associated with ulcer pain. She was on Carafate and Protonix. CBC on 07/15/20202 revealed a hematocrit 34.1, hemoglobin 9.8, MCV 88.8. Ferritin was 7.  She received Feraheme on 08/20/2018.  I discussed plan for short term follow-up imaging. Chest CT was scheduled for 11/2018.   CBC on 11/12/2018 revealed a hematocrit of 37.9, hemoglobin 11.9, and MCV 87.0.  Ferritin was 9 with an iron saturation of 14% and a TIBC of 345.   B12 was 1409 and folate was 39.0.  She received Feraheme 510 mg on 11/18/2018.   Upper endoscopy by Dr. Alice Reichert on 12/01/2018 revealed a normal esophagus. There was patent Billroth I gastroduodenostomy, characterized by erythema (reactive gastropathy). There was a 1 cm hiatal hernia. There was suspected jejunal inflammation characterized by erosions and friability. 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    Chest CT on 12/04/2018 was Lung-RADS 2, benign appearance or behavior. The 15.3 mm RLL nodule was unchanged in size.  Plan was for continued annual screening with low-dose chest CT without contrast in 12 months.   During the interim, she has felt "good".  She has felt better since her IV iron infusion.  Her stomach feels much better since being off budesonide. She notes weight loss while being on busdesonide. She is gaining the weight back.  She has diarrhea and is taking Imodium. She has some abdominal pain. She notes a cough in the mornings related to seasonal allergies. She is taking Claritin.     Past Medical History:  Diagnosis Date   Arthritis    Avascular necrosis of hip, right ([place])    Blepharospasm    Cancer ([place]) 2011   Right Upper Lobe Lobectomy   Chronic diarrhea    Chronic diarrhea    Collagenous colitis    COPD (chronic obstructive pulmonary disease) (HCC)    Diverticulosis    Gastric outlet obstruction    Hemorrhoid    History of Crohn's disease    IDA (iron deficiency anemia)    Intestinal adhesions    Multiple gastric ulcers    Multiple gastric ulcers    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[place])    Stroke [facility])     Past Surgical History:  Procedure Laterality Date   APPENDECTOMY  1989   botox injections for blepharospasm     BREAST SURGERY     CATARACT EXTRACTION     COLON RESECTION  2005   COLONOSCOPY  10-29-2008   [name]   COLONOSCOPY WITH PROPOFOL N/A 03/05/2017   Procedure: COLONOSCOPY WITH PROPOFOL;  Surgeon: Toledo, Benay Pike, MD;  Location: [facility] ENDOSCOPY;  Service: Gastroenterology;  Laterality: N/A;   COLOSTOMY REVERSAL  2006   ESOPHAGOGASTRODUODENOSCOPY (EGD) WITH PROPOFOL N/A 07/18/2016   Procedure: ESOPHAGOGASTRODUODENOSCOPY (EGD) WITH PROPOFOL;  Surgeon: Robert Bellow, MD;  Location: [facility] ENDOSCOPY;  Service: Endoscopy;  Laterality: N/A;   ESOPHAGOGASTRODUODENOSCOPY (EGD) WITH PROPOFOL N/A 08/03/2016   Procedure: ESOPHAGOGASTRODUODENOSCOPY (EGD) WITH PROPOFOL;  Surgeon: Robert Bellow, MD;  Location: [facility] ENDOSCOPY;  Service: Endoscopy;  Laterality: N/A;   ESOPHAGOGASTRODUODENOSCOPY (  EGD) WITH PROPOFOL N/A 09/01/2018   Procedure: ESOPHAGOGASTRODUODENOSCOPY (EGD) WITH PROPOFOL;  Surgeon: Toledo, Benay Pike, MD;  Location: [facility] ENDOSCOPY;  Service: Gastroenterology;  Laterality: N/A;   ESOPHAGOGASTRODUODENOSCOPY (EGD) WITH PROPOFOL N/A 12/01/2018   Procedure: ESOPHAGOGASTRODUODENOSCOPY (EGD) WITH PROPOFOL;  Surgeon: Toledo, Benay Pike, MD;  Location: [facility] ENDOSCOPY;  Service: Gastroenterology;   Laterality: N/A;   ESOPHAGOSCOPY WITH DILITATION  2012,2015   [facility] [place]   EYE SURGERY     GASTRECTOMY     gastric ulcer  1989, 1991   JOINT REPLACEMENT     right total hip arthroplasty 03/03/02   LAPAROSCOPIC LYSIS OF ADHESIONS     LUNG CANCER SURGERY  2011   LYSIS OF ADHESION     PLACEMENT OF BREAST IMPLANTS  1973   thoracotomy with right upper lobectomy and central compartment node dissection     UPPER GASTROINTESTINAL ENDOSCOPY  10-29-2008   [name]    History reviewed. No pertinent family history.  Social History:  reports that she quit smoking about 10 years ago. She quit after 25.00 years of use. She has never used smokeless tobacco. She reports current alcohol use of about 7.0 standard drinks of alcohol per week. She reports that she does not use drugs. She has a30pack-year smoking history (30 years x1ppd). She quit when she was diagnosed with lung cancer in 2012. She denies any known exposure to radiations or toxins. 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 The patient is alone today.  Allergies:  Allergies  Allergen Reactions   Hydromorphone Hcl Itching   Bentyl [Dicyclomine] Nausea And Vomiting   Biaxin [Clarithromycin] Other (See Comments)    hallucinations   Erythromycin Nausea And Vomiting   Reglan [Metoclopramide] Other (See Comments)    tremors    Current Medications: Current Outpatient Medications  Medication Sig Dispense Refill   acetaminophen (TYLENOL) 500 MG tablet Take 500 mg by mouth every 6 (six) hours as needed.     aspirin EC 81 MG tablet Take 81 mg by mouth daily.     brimonidine-timolol (COMBIGAN) 0.2-0.5 % ophthalmic solution INT 1 GTT IN OU BID     HYDROcodone-acetaminophen (NORCO/VICODIN) 5-325 MG per tablet Take 1 tablet by mouth every 6 (six) hours as needed.      loperamide  (IMODIUM) 2 MG capsule Take by mouth as needed for diarrhea or loose stools.     loratadine (CLARITIN) 10 MG tablet Take 10 mg by mouth daily.     Multiple Vitamin (MULTI-VITAMINS) TABS Take 1 tablet by mouth daily. once daily.     pantoprazole (PROTONIX) 40 MG tablet Take 40 mg by mouth 2 (two) times daily.      sucralfate (CARAFATE) 1 G tablet Take 1 g by mouth 4 (four) times daily.      traZODone (DESYREL) 150 MG tablet Take 300 mg by mouth at bedtime.      venlafaxine XR (EFFEXOR-XR) 150 MG 24 hr capsule Take 150 mg by mouth daily with breakfast.      budesonide (ENTOCORT EC) 3 MG 24 hr capsule Take 3-6 mg by mouth daily.      ferrous sulfate 325 (65 FE) MG tablet Take 325 mg by mouth every other day.     ondansetron (ZOFRAN) 4 MG tablet Take 1 tablet (4 mg total) by mouth every 8 (eight) hours as needed for  nausea or vomiting. (Patient not taking: Reported on 12/08/2018) 10 tablet 0   No current facility-administered medications for this visit.     Review of Systems  Constitutional: Positive for weight loss (1 lb). Negative for chills, diaphoresis, fever and malaise/fatigue.       Feels "good".  HENT: Negative.  Negative for congestion, ear pain, hearing loss, nosebleeds, sinus pain and sore throat.   Eyes: Negative.  Negative for blurred vision, double vision and photophobia.  Respiratory: Positive for cough (in the mornings, related to allergies). Negative for hemoptysis, sputum production, shortness of breath and wheezing.   Cardiovascular: Negative.  Negative for chest pain, palpitations, claudication, leg swelling and PND.  Gastrointestinal: Positive for abdominal pain and diarrhea (on Imodium). Negative for blood in stool, constipation, heartburn, melena, nausea and vomiting.  Genitourinary: Negative.  Negative for dysuria, frequency, hematuria and urgency.  Musculoskeletal: Negative.  Negative for back pain, joint pain and myalgias.  Skin: Negative.  Negative for rash.   Neurological: Negative.  Negative for dizziness, tingling, sensory change, speech change, focal weakness, weakness and headaches.  Endo/Heme/Allergies: Positive for environmental allergies (seasonal; on claritin). Does not bruise/bleed easily.  Psychiatric/Behavioral: Negative.  Negative for depression and memory loss. The patient is not nervous/anxious and does not have insomnia.   All other systems reviewed and are negative.  Performance status (ECOG): 1  Vitals Blood pressure 111/76, pulse 80, temperature 97.9 F (36.6 C), temperature source Tympanic, resp. rate 18, height 5' (1.524 m), weight 81 lb 2.1 oz (36.8 kg), SpO2 100 %.  Physical Exam  Constitutional: She is oriented to person, place, and time. She appears well-developed and well-nourished. No distress.  HENT:  Head: Normocephalic and atraumatic.  Short light brown hair.  Mask.  Eyes: Conjunctivae and EOM are normal. No scleral icterus.  Glasses.  Blue eyes.  Neck: Normal range of motion. Neck supple. No JVD present.  Cardiovascular: Normal rate, regular rhythm and normal heart sounds.  No murmur heard. Pulmonary/Chest: Effort normal and breath sounds normal. No respiratory distress. She has no wheezes. She has no rales. She exhibits no tenderness.  Musculoskeletal:        General: No tenderness or edema.  Neurological: She is alert and oriented to person, place, and time.  Skin: Skin is warm and dry. No rash noted. She is not diaphoretic. No erythema. No pallor.  Psychiatric: She has a normal mood and affect. Her behavior is normal. Judgment and thought content normal.  Nursing note and vitals reviewed.   No visits with results within 3 Day(s) from this visit.  Latest known visit with results is:  Admission on 12/01/2018, Discharged on 12/01/2018  Component Date Value Ref Range Status   SURGICAL PATHOLOGY 12/01/2018    Final-Edited                   Value:SURGICAL PATHOLOGY CASE: [id number] PATIENT: Andreas Ohm Surgical Pathology Report  Specimen Submitted: A. Stomach pouch; cbx  Clinical History: Stenosis. Small bowel strictures, (non obstructing) hiatal hernia, retained food in gastric pouch.  DIAGNOSIS: A. STOMACH POUCH; COLD BIOPSY: - GASTRIC ANTRAL MUCOSA WITH FEATURES OF REACTIVE GASTROPATHY. - NEGATIVE FOR ACTIVE INFLAMMATION AND H. PYLORI. - NEGATIVE FOR DYSPLASIA AND MALIGNANCY.  GROSS DESCRIPTION: A. Labeled: C BX gastric punch, rule out H. pylori Received: In formalin Tissue fragment(s): 2 Size: 0.2 and 0.4 cm Description: Tan soft tissue fragments Entirely submitted in 1 cassette.  Final Diagnosis performed by Allena Napoleon, MD.   Electronically signed 12/02/2018  10:45:10AM The electronic signature indicates that the named Attending Pathologist has evaluated the specimen Technical component performed at [facility], [address], [place], [place] [address] Lab: [phone] Dir: Rich Reining, MD, MMM  Professional component performed at [facility], [facility], [place], [place], [place] [address] Lab: [phone] Dir: Dellia Nims. Reuel Derby, MD    Assessment:  SHANYIA STINES is a 73 y.o. female withhyopgammaglobulinemiaand iron deficiency anemia.IgG levelshas beenin the low 300s.M-proteinis 0.She has not receivedIVIG.She has not had issues with recurrent infections.  Shehas collagenous colitisand is on budesonide. She has a history of peptic ulcer disease and is s/pgastrojejunostomyin 1980s. She had asmall bowel obstruction and colonic perforation in the pasts/presection colostomy.Shehaschronicabdominalpain.  EGD on 09/01/2018 revealed esophageal plaques suspicious of candidiasis (KOH -).  There was a stenosed Billroth I gastroduodenostomy, characterized by ulceration and an intact staple line. Lesion was not amenable to dilation. Jejunum pathology revealed enteric mucosa with pyloric metaplasia and  negative for active inflammation, and dysplasia. Stomach, pouch revealed reactive gastropathy, negative for active inflammation, H pylori, intestinal metaplasia, dysplasia, and malignancy. EGD on 12/01/2018 revealed a normal esophagus. There was patent Billroth I gastroduodenostomy, characterized by erythema (reactive gastropathy). There was a 1 cm hiatal hernia. There was suspected jejunal inflammation characterized by erosions and friability. 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    She has a history of lung cancerin 2012 s/p right upper loberesection.  Low dose chest CT on 09/04/2018 revealed multiple small pulmonary nodules are noted in the lungs bilaterally. In addition, there was a 1.53 cm concerning nodule in the right lower lobe. There was diffuse bronchial wall thickening with mild centrilobular and paraseptal emphysema; imaging findings suggestive of underlying COPD.   PET scan on 09/16/2018 revealed low level FDG uptake associated with the right lower lobe lung nodule (SUV 1.57). This was a nonspecific finding. Given the size of this nodule and the low level uptake, findings may reflect inflammatory or infectious nodule. Malignancy was not entirely excluded.   She has a history ofiron deficiency anemia.She received Ferahemex2 (08/12/2017- 08/19/2017), 08/20/2018, and 11/18/2018.Oral ironhas been ineffective in the past  Ferritin has been followed: 9 on 05/07/2017, 14 on 07/15/2017, 66 on 11/07/2017, 12 on 02/12/2018, 7 on 08/13/2018, and 9 on 11/12/2018.  Symptomatically, she feels good.  Exam is unremarkable.  Plan: 1.   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 SPEPrevealed no monoclonal protein. Consider immunology referral. 2. Iron deficiency She has received Feraheme x 2 during the interim (08/20/2018 and 11/18/2018). She received Feraheme on 08/20/2018.  Patient wishes local  GI follow-up in [place] (referral to Dr Allen Norris). 3. History of lung cancer She is s/p RUL lobectomy 2012. Low dose chest CT on 09/04/2018 revealed multiple small pulmonary nodules bilaterally and a 1.53 cm nodule in the right lower lobe.              PET scan on 09/16/2018 revealed low level FDG uptake associated with the right lower lobe lung nodule (SUV 1.57), nonspecific finding.  Short term follow-up chest CT on 12/04/2018 revealed a stable 1.53 cm RLL nodule.    Discuss plan for extended follow-up imaging in 6-12 months.   Patient interested in shorter term follow-up imaging.   If imaging stable, then annual follow-up. 4.Schedule chest CT on 06/03/2019. 5.   RTC on 02/18/2019 for MD assessment, labs (CBC with  diff, ferritin- day before) and +/- Feraheme.  I discussed the assessment and treatment plan with the patient.  The patient was provided an opportunity to ask questions and all were answered.  The patient agreed with the plan and demonstrated an understanding of the instructions.  The patient was advised to call back if the symptoms worsen or if the condition fails to improve as anticipated.  I provided 16 minutes of face-to-face time during this this encounter and > 50% was spent counseling as documented under my assessment and plan.    Lequita Asal, MD, PhD    12/09/2018, 9:29 AM  I, Selena Batten, am acting as scribe for [employer]. Mike Gip, MD, PhD.  I, Giulia Hickey C. Mike Gip, MD, have reviewed the above documentation for accuracy and completeness, and I agree with the above.

## 2018-12-08 ENCOUNTER — Encounter: Payer: Self-pay | Admitting: Hematology and Oncology

## 2018-12-08 ENCOUNTER — Ambulatory Visit: Payer: Medicare Other

## 2018-12-08 ENCOUNTER — Other Ambulatory Visit: Payer: Self-pay

## 2018-12-09 ENCOUNTER — Inpatient Hospital Stay: Payer: Medicare Other | Attending: Hematology and Oncology | Admitting: Hematology and Oncology

## 2018-12-09 ENCOUNTER — Other Ambulatory Visit: Payer: Self-pay

## 2018-12-09 ENCOUNTER — Encounter: Payer: Self-pay | Admitting: Hematology and Oncology

## 2018-12-09 VITALS — BP 111/76 | HR 80 | Temp 97.9°F | Resp 18 | Ht 60.0 in | Wt 81.1 lb

## 2018-12-09 DIAGNOSIS — D801 Nonfamilial hypogammaglobulinemia: Secondary | ICD-10-CM | POA: Insufficient documentation

## 2018-12-09 DIAGNOSIS — Z85118 Personal history of other malignant neoplasm of bronchus and lung: Secondary | ICD-10-CM | POA: Diagnosis not present

## 2018-12-09 DIAGNOSIS — R911 Solitary pulmonary nodule: Secondary | ICD-10-CM | POA: Diagnosis not present

## 2018-12-09 DIAGNOSIS — D509 Iron deficiency anemia, unspecified: Secondary | ICD-10-CM | POA: Diagnosis not present

## 2018-12-09 DIAGNOSIS — Z8673 Personal history of transient ischemic attack (TIA), and cerebral infarction without residual deficits: Secondary | ICD-10-CM | POA: Diagnosis not present

## 2018-12-09 DIAGNOSIS — Z902 Acquired absence of lung [part of]: Secondary | ICD-10-CM | POA: Insufficient documentation

## 2018-12-09 DIAGNOSIS — Z87891 Personal history of nicotine dependence: Secondary | ICD-10-CM | POA: Diagnosis not present

## 2018-12-09 DIAGNOSIS — Z79899 Other long term (current) drug therapy: Secondary | ICD-10-CM | POA: Diagnosis not present

## 2018-12-09 DIAGNOSIS — K5090 Crohn's disease, unspecified, without complications: Secondary | ICD-10-CM | POA: Diagnosis not present

## 2018-12-09 DIAGNOSIS — Z7982 Long term (current) use of aspirin: Secondary | ICD-10-CM | POA: Insufficient documentation

## 2018-12-09 DIAGNOSIS — J449 Chronic obstructive pulmonary disease, unspecified: Secondary | ICD-10-CM | POA: Insufficient documentation

## 2018-12-09 DIAGNOSIS — K311 Adult hypertrophic pyloric stenosis: Secondary | ICD-10-CM

## 2018-12-09 NOTE — Progress Notes (Signed)
Patient c/o lower abdomen pain today ( 5). She states she is taken medications for this issue.

## 2019-01-26 ENCOUNTER — Encounter: Payer: Self-pay | Admitting: Gastroenterology

## 2019-01-26 ENCOUNTER — Other Ambulatory Visit: Payer: Self-pay

## 2019-01-26 ENCOUNTER — Ambulatory Visit (INDEPENDENT_AMBULATORY_CARE_PROVIDER_SITE_OTHER): Payer: Medicare Other | Admitting: Gastroenterology

## 2019-01-26 VITALS — BP 123/76 | HR 96 | Temp 98.3°F | Ht 60.0 in | Wt 84.8 lb

## 2019-01-26 DIAGNOSIS — K56699 Other intestinal obstruction unspecified as to partial versus complete obstruction: Secondary | ICD-10-CM

## 2019-01-26 MED ORDER — DIPHENOXYLATE-ATROPINE 2.5-0.025 MG PO TABS: 1.0000 | ORAL_TABLET | Freq: Four times a day (QID) | ORAL | 5 refills | Status: DC | PRN

## 2019-01-26 NOTE — H&P (View-Only) (Signed)
Gastroenterology Consultation  Referring Provider:     Lequita Asal, MD Primary Care Physician:  Albina Billet, MD Primary Gastroenterologist:  Dr. Allen Norris     Reason for Consultation:     Dysphagia        HPI:   Carol Shaffer is a 73 y.o. y/o female referred for consultation & management of dysphagia by Dr. Hall Busing, Leona Carry, MD.  This patient comes in today with a history of having surgery on her stomach which was reported to be a Billroth I.  The patient has had a stricture noted and in the past has had dilation of the stricture with relief.  Most recently she has been followed by a another gastroenterologist who has scoped her without any dilation done at the most recent procedure.  The patient continues to have problems with eating.  She also suffers from collagenous colitis and reports that she does not tolerate Imodium or budesonide.  There is no report of any black stools or bloody stools and she follows up with hematology for her iron deficiency.  The patient was unhappy with the care she was getting at her previous GI office and decided to switch and come see me today.  There is no report of any abdominal pain fevers chills nausea or vomiting. On a previous upper endoscopy in August the patient was found to have the same stenosis with inflammation and ulcer.  At that time dilation was also not initiated  Past Medical History:  Diagnosis Date  . Arthritis   . Avascular necrosis of hip, right ([place])   . Blepharospasm   . Cancer [facility]) 2011   Right Upper Lobe Lobectomy  . Chronic diarrhea   . Chronic diarrhea   . Collagenous colitis   . COPD (chronic obstructive pulmonary disease) ([place])   . Diverticulosis   . Gastric outlet obstruction   . Hemorrhoid   . History of Crohn's disease   . IDA (iron deficiency anemia)   . Intestinal adhesions   . Multiple gastric ulcers   . Multiple gastric ulcers   . 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[place])   . Stroke [facility])     Past  Surgical History:  Procedure Laterality Date  . APPENDECTOMY  1989  . botox injections for blepharospasm    . BREAST SURGERY    . CATARACT EXTRACTION    . COLON RESECTION  2005  . COLONOSCOPY  10-29-2008   [name]  . COLONOSCOPY WITH PROPOFOL N/A 03/05/2017   Procedure: COLONOSCOPY WITH PROPOFOL;  Surgeon: Toledo, Benay Pike, MD;  Location: [facility] END[facility];  Service: Gastroenterology;  Laterality: N/A;  . COLOSTOMY REVERSAL  2006  . ESOPHAGOGASTRODUODENOSCOPY (EGD) WITH PROPOFOL N/A 07/18/2016   Procedure: ESOPHAGOGASTRODUODENOSCOPY (EGD) WITH PROPOFOL;  Surgeon: Robert Bellow, MD;  Location: [facility];  Service: Endoscopy;  Laterality: N/A;  . ESOPHAGOGASTRODUODENOSCOPY (EGD) WITH PROPOFOL N/A 08/03/2016   Procedure: ESOPHAGOGASTRODUODENOSCOPY (EGD) WITH PROPOFOL;  Surgeon: Robert Bellow, MD;  Location: [facility];  Service: Endoscopy;  Laterality: N/A;  . ESOPHAGOGASTRODUODENOSCOPY (EGD) WITH PROPOFOL N/A 09/01/2018   Procedure: ESOPHAGOGASTRODUODENOSCOPY (EGD) WITH PROPOFOL;  Surgeon: Toledo, Benay Pike, MD;  Location: [facility];  Service: Gastroenterology;  Laterality: N/A;  . ESOPHAGOGASTRODUODENOSCOPY (EGD) WITH PROPOFOL N/A 12/01/2018   Procedure: ESOPHAGOGASTRODUODENOSCOPY (EGD) WITH PROPOFOL;  Surgeon: Toledo, Benay Pike, MD;  Location: [facility];  Service: Gastroenterology;  Laterality: N/A;  . ESOPHAGOSCOPY WITH DILITATION  2012,2015   [facility] [place]  . EYE SURGERY    .  GASTRECTOMY    . gastric ulcer  1989, 1991  . JOINT REPLACEMENT     right total hip arthroplasty 03/03/02  . LAPAROSCOPIC LYSIS OF ADHESIONS    . LUNG CANCER SURGERY  2011  . LYSIS OF ADHESION    . PLACEMENT OF BREAST IMPLANTS  1973  . thoracotomy with right upper lobectomy and central compartment node dissection    . UPPER GASTROINTESTINAL ENDOSCOPY  10-29-2008   [name]    Prior to Admission medications   Medication Sig Start Date End Date Taking? Authorizing Provider  acetaminophen  (TYLENOL) 500 MG tablet Take 500 mg by mouth every 6 (six) hours as needed.   Yes [provider]  aspirin EC 81 MG tablet Take 81 mg by mouth daily.   Yes [provider]  brimonidine-timolol (COMBIGAN) 0.2-0.5 % ophthalmic solution INT 1 GTT IN OU BID 10/29/17  Yes [provider]  budesonide (ENTOCORT EC) 3 MG 24 hr capsule Take 3-6 mg by mouth daily.  08/19/18  Yes [provider]  ferrous sulfate 325 (65 FE) MG tablet Take 325 mg by mouth every other day.   Yes [provider]  HYDROcodone-acetaminophen (NORCO/VICODIN) 5-325 MG per tablet Take 1 tablet by mouth every 6 (six) hours as needed.  08/17/13  Yes [provider]  loperamide (IMODIUM) 2 MG capsule Take by mouth as needed for diarrhea or loose stools.   Yes [provider]  loratadine (CLARITIN) 10 MG tablet Take 10 mg by mouth daily.   Yes [provider]  Multiple Vitamin (MULTI-VITAMINS) TABS Take 1 tablet by mouth daily. once daily.   Yes [provider]  pantoprazole (PROTONIX) 40 MG tablet Take 40 mg by mouth 2 (two) times daily.  10/28/13  Yes [provider]  sucralfate (CARAFATE) 1 G tablet Take 1 g by mouth 4 (four) times daily.  10/28/13  Yes [provider]  traZODone (DESYREL) 150 MG tablet Take 300 mg by mouth at bedtime.  10/28/13  Yes [provider]  venlafaxine XR (EFFEXOR-XR) 150 MG 24 hr capsule Take 150 mg by mouth daily with breakfast.  10/28/13  Yes [provider]  diphenoxylate-atropine (LOMOTIL) 2.5-0.025 MG tablet Take 1 tablet by mouth 4 (four) times daily as needed for diarrhea or loose stools. 01/26/19   Lucilla Lame, MD  ondansetron (ZOFRAN) 4 MG tablet Take 1 tablet (4 mg total) by mouth every 8 (eight) hours as needed for nausea or vomiting. Patient not taking: Reported on 12/08/2018 07/31/16   Robert Bellow, MD    History reviewed. No pertinent family history.   Social History   Tobacco  Use  . Smoking status: Former Smoker    Years: 25.00    Quit date: 01/30/2008    Years since quitting: 10.9  . Smokeless tobacco: Never Used  Substance Use Topics  . Alcohol use: Yes    Alcohol/week: 7.0 standard drinks    Types: 7 Glasses of wine per week    Comment: wine  . Drug use: No    Allergies as of 01/26/2019 - Review Complete 01/26/2019  Allergen Reaction Noted  . Hydromorphone hcl Itching 05/20/2017  . Bentyl [dicyclomine] Nausea And Vomiting 12/02/2013  . Biaxin [clarithromycin] Other (See Comments) 11/11/2013  . Erythromycin Nausea And Vomiting 12/02/2013  . Reglan [metoclopramide] Other (See Comments) 11/11/2013    Review of Systems:    All systems reviewed and negative except where noted in HPI.   Physical Exam:  BP 123/76  Pulse 96   Temp 98.3 F (36.8 C) (Temporal)   Ht 5' (1.524 m)   Wt 84 lb 12.8 oz (38.5 kg)   BMI 16.56 kg/m  No LMP recorded. Patient is postmenopausal. General:   Alert,  Well-developed, well-nourished, pleasant and cooperative in NAD Head:  Normocephalic and atraumatic. Eyes:  Sclera clear, no icterus.   Conjunctiva pink. Ears:  Normal auditory acuity. Neck:  Supple; no masses or thyromegaly. Lungs:  Respirations even and unlabored.  Clear throughout to auscultation.   No wheezes, crackles, or rhonchi. No acute distress. Heart:  Regular rate and rhythm; no murmurs, clicks, rubs, or gallops. Abdomen:  Normal bowel sounds.  No bruits.  Soft, non-tender and non-distended without masses, hepatosplenomegaly or hernias noted.  No guarding or rebound tenderness.  Negative Carnett sign.   Rectal:  Deferred.  Msk:  Symmetrical without gross deformities.  Good, equal movement & strength bilaterally. Pulses:  Normal pulses noted. Extremities:  No clubbing or edema.  No cyanosis. Neurologic:  Alert and oriented x3;  grossly normal neurologically. Skin:  Intact without significant lesions or rashes.  No jaundice. Lymph Nodes:  No significant  cervical adenopathy. Psych:  Alert and cooperative. Normal mood and affect.  Imaging Studies: No results found.  Assessment and Plan:   Carol Shaffer is a 73 y.o. y/o female who comes in with a history of an anastomotic stricture with multiple EGDs including one in February 2019 with a repeat in August 2020 and September 2020.  The patient reports that she has felt better after dilation in the past by Dr. Bary Castilla and is concerned because she has not had relief after her last 2 EGDs.  The patient will be set up for an EGD with dilation.  The patient has been explained the plan and agrees with proceeding with the procedure.   Lucilla Lame, MD. Marval Regal    Note: This dictation was prepared with Dragon dictation along with smaller phrase technology. Any transcriptional errors that result from this process are unintentional.

## 2019-01-26 NOTE — Progress Notes (Signed)
Gastroenterology Consultation  Referring Provider:     Lequita Asal, MD Primary Care Physician:  Carol Billet, MD Primary Gastroenterologist:  Carol Shaffer     Reason for Consultation:     Dysphagia        HPI:   Carol Shaffer is a 73 y.o. y/o female referred for consultation & management of dysphagia by Dr. Hall Shaffer, Carol Carry, MD.  This patient comes in today with a history of having surgery on her stomach which was reported to be a Billroth I.  The patient has had a stricture noted and in the past has had dilation of the stricture with relief.  Most recently she has been followed by a another gastroenterologist who has scoped her without any dilation done at the most recent procedure.  The patient continues to have problems with eating.  She also suffers from collagenous colitis and reports that she does not tolerate Imodium or budesonide.  There is no report of any black stools or bloody stools and she follows up with hematology for her iron deficiency.  The patient was unhappy with the care she was getting at her previous GI office and decided to switch and come see me today.  There is no report of any abdominal pain fevers chills nausea or vomiting. On a previous upper endoscopy in August the patient was found to have the same stenosis with inflammation and ulcer.  At that time dilation was also not initiated  Past Medical History:  Diagnosis Date  . Arthritis   . Avascular necrosis of hip, right ([place])   . Blepharospasm   . Cancer [facility]) 2011   Right Upper Lobe Lobectomy  . Chronic diarrhea   . Chronic diarrhea   . Collagenous colitis   . COPD (chronic obstructive pulmonary disease) ([place])   . Diverticulosis   . Gastric outlet obstruction   . Hemorrhoid   . History of Crohn's disease   . IDA (iron deficiency anemia)   . Intestinal adhesions   . Multiple gastric ulcers   . Multiple gastric ulcers   . 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[place])   . Stroke [facility])     Past  Surgical History:  Procedure Laterality Date  . APPENDECTOMY  1989  . botox injections for blepharospasm    . BREAST SURGERY    . CATARACT EXTRACTION    . COLON RESECTION  2005  . COLONOSCOPY  10-29-2008   [name]  . COLONOSCOPY WITH PROPOFOL N/A 03/05/2017   Procedure: COLONOSCOPY WITH PROPOFOL;  Surgeon: Toledo, Benay Pike, MD;  Location: [facility] ENDOSCOPY;  Service: Gastroenterology;  Laterality: N/A;  . COLOSTOMY REVERSAL  2006  . ESOPHAGOGASTRODUODENOSCOPY (EGD) WITH PROPOFOL N/A 07/18/2016   Procedure: ESOPHAGOGASTRODUODENOSCOPY (EGD) WITH PROPOFOL;  Surgeon: Robert Bellow, MD;  Location: [facility] ENDOSCOPY;  Service: Endoscopy;  Laterality: N/A;  . ESOPHAGOGASTRODUODENOSCOPY (EGD) WITH PROPOFOL N/A 08/03/2016   Procedure: ESOPHAGOGASTRODUODENOSCOPY (EGD) WITH PROPOFOL;  Surgeon: Robert Bellow, MD;  Location: [facility] ENDOSCOPY;  Service: Endoscopy;  Laterality: N/A;  . ESOPHAGOGASTRODUODENOSCOPY (EGD) WITH PROPOFOL N/A 09/01/2018   Procedure: ESOPHAGOGASTRODUODENOSCOPY (EGD) WITH PROPOFOL;  Surgeon: Toledo, Benay Pike, MD;  Location: [facility] ENDOSCOPY;  Service: Gastroenterology;  Laterality: N/A;  . ESOPHAGOGASTRODUODENOSCOPY (EGD) WITH PROPOFOL N/A 12/01/2018   Procedure: ESOPHAGOGASTRODUODENOSCOPY (EGD) WITH PROPOFOL;  Surgeon: Toledo, Benay Pike, MD;  Location: [facility] ENDOSCOPY;  Service: Gastroenterology;  Laterality: N/A;  . ESOPHAGOSCOPY WITH DILITATION  2012,2015   [facility] [place]  . EYE SURGERY    .  GASTRECTOMY    . gastric ulcer  1989, 1991  . JOINT REPLACEMENT     right total hip arthroplasty 03/03/02  . LAPAROSCOPIC LYSIS OF ADHESIONS    . LUNG CANCER SURGERY  2011  . LYSIS OF ADHESION    . PLACEMENT OF BREAST IMPLANTS  1973  . thoracotomy with right upper lobectomy and central compartment node dissection    . UPPER GASTROINTESTINAL ENDOSCOPY  10-29-2008   [name]    Prior to Admission medications   Medication Sig Start Date End Date Taking? Authorizing Provider  acetaminophen  (TYLENOL) 500 MG tablet Take 500 mg by mouth every 6 (six) hours as needed.   Yes [provider]  aspirin EC 81 MG tablet Take 81 mg by mouth daily.   Yes [provider]  brimonidine-timolol (COMBIGAN) 0.2-0.5 % ophthalmic solution INT 1 GTT IN OU BID 10/29/17  Yes [provider]  budesonide (ENTOCORT EC) 3 MG 24 hr capsule Take 3-6 mg by mouth daily.  08/19/18  Yes [provider]  ferrous sulfate 325 (65 FE) MG tablet Take 325 mg by mouth every other day.   Yes [provider]  HYDROcodone-acetaminophen (NORCO/VICODIN) 5-325 MG per tablet Take 1 tablet by mouth every 6 (six) hours as needed.  08/17/13  Yes [provider]  loperamide (IMODIUM) 2 MG capsule Take by mouth as needed for diarrhea or loose stools.   Yes [provider]  loratadine (CLARITIN) 10 MG tablet Take 10 mg by mouth daily.   Yes [provider]  Multiple Vitamin (MULTI-VITAMINS) TABS Take 1 tablet by mouth daily. once daily.   Yes [provider]  pantoprazole (PROTONIX) 40 MG tablet Take 40 mg by mouth 2 (two) times daily.  10/28/13  Yes [provider]  sucralfate (CARAFATE) 1 G tablet Take 1 g by mouth 4 (four) times daily.  10/28/13  Yes [provider]  traZODone (DESYREL) 150 MG tablet Take 300 mg by mouth at bedtime.  10/28/13  Yes [provider]  venlafaxine XR (EFFEXOR-XR) 150 MG 24 hr capsule Take 150 mg by mouth daily with breakfast.  10/28/13  Yes [provider]  diphenoxylate-atropine (LOMOTIL) 2.5-0.025 MG tablet Take 1 tablet by mouth 4 (four) times daily as needed for diarrhea or loose stools. 01/26/19   Lucilla Lame, MD  ondansetron (ZOFRAN) 4 MG tablet Take 1 tablet (4 mg total) by mouth every 8 (eight) hours as needed for nausea or vomiting. Patient not taking: Reported on 12/08/2018 07/31/16   Robert Bellow, MD    History reviewed. No pertinent family history.   Social History   Tobacco  Use  . Smoking status: Former Smoker    Years: 25.00    Quit date: 01/30/2008    Years since quitting: 10.9  . Smokeless tobacco: Never Used  Substance Use Topics  . Alcohol use: Yes    Alcohol/week: 7.0 standard drinks    Types: 7 Glasses of wine per week    Comment: wine  . Drug use: No    Allergies as of 01/26/2019 - Review Complete 01/26/2019  Allergen Reaction Noted  . Hydromorphone hcl Itching 05/20/2017  . Bentyl [dicyclomine] Nausea And Vomiting 12/02/2013  . Biaxin [clarithromycin] Other (See Comments) 11/11/2013  . Erythromycin Nausea And Vomiting 12/02/2013  . Reglan [metoclopramide] Other (See Comments) 11/11/2013    Review of Systems:    All systems reviewed and negative except where noted in HPI.   Physical Exam:  BP 123/76  Pulse 96   Temp 98.3 F (36.8 C) (Temporal)   Ht 5' (1.524 m)   Wt 84 lb 12.8 oz (38.5 kg)   BMI 16.56 kg/m  No LMP recorded. Patient is postmenopausal. General:   Alert,  Well-developed, well-nourished, pleasant and cooperative in NAD Head:  Normocephalic and atraumatic. Eyes:  Sclera clear, no icterus.   Conjunctiva pink. Ears:  Normal auditory acuity. Neck:  Supple; no masses or thyromegaly. Lungs:  Respirations even and unlabored.  Clear throughout to auscultation.   No wheezes, crackles, or rhonchi. No acute distress. Heart:  Regular rate and rhythm; no murmurs, clicks, rubs, or gallops. Abdomen:  Normal bowel sounds.  No bruits.  Soft, non-tender and non-distended without masses, hepatosplenomegaly or hernias noted.  No guarding or rebound tenderness.  Negative Carnett sign.   Rectal:  Deferred.  Msk:  Symmetrical without gross deformities.  Good, equal movement & strength bilaterally. Pulses:  Normal pulses noted. Extremities:  No clubbing or edema.  No cyanosis. Neurologic:  Alert and oriented x3;  grossly normal neurologically. Skin:  Intact without significant lesions or rashes.  No jaundice. Lymph Nodes:  No significant  cervical adenopathy. Psych:  Alert and cooperative. Normal mood and affect.  Imaging Studies: No results found.  Assessment and Plan:   TATE JERKINS is a 73 y.o. y/o female who comes in with a history of an anastomotic stricture with multiple EGDs including one in February 2019 with a repeat in August 2020 and September 2020.  The patient reports that she has felt better after dilation in the past by Dr. Bary Castilla and is concerned because she has not had relief after her last 2 EGDs.  The patient will be set up for an EGD with dilation.  The patient has been explained the plan and agrees with proceeding with the procedure.   Lucilla Lame, MD. Carol Shaffer    Note: This dictation was prepared with Dragon dictation along with smaller phrase technology. Any transcriptional errors that result from this process are unintentional.

## 2019-01-27 ENCOUNTER — Other Ambulatory Visit: Payer: Self-pay

## 2019-01-28 ENCOUNTER — Other Ambulatory Visit: Payer: Self-pay

## 2019-01-28 DIAGNOSIS — K56699 Other intestinal obstruction unspecified as to partial versus complete obstruction: Secondary | ICD-10-CM

## 2019-02-11 ENCOUNTER — Other Ambulatory Visit: Payer: Self-pay

## 2019-02-11 ENCOUNTER — Encounter: Payer: Self-pay | Admitting: Gastroenterology

## 2019-02-16 ENCOUNTER — Other Ambulatory Visit: Payer: Self-pay

## 2019-02-17 ENCOUNTER — Inpatient Hospital Stay: Payer: Medicare Other | Attending: Hematology and Oncology

## 2019-02-17 ENCOUNTER — Other Ambulatory Visit: Payer: Self-pay

## 2019-02-17 DIAGNOSIS — F329 Major depressive disorder, single episode, unspecified: Secondary | ICD-10-CM | POA: Insufficient documentation

## 2019-02-17 DIAGNOSIS — K52831 Collagenous colitis: Secondary | ICD-10-CM | POA: Diagnosis not present

## 2019-02-17 DIAGNOSIS — D801 Nonfamilial hypogammaglobulinemia: Secondary | ICD-10-CM | POA: Insufficient documentation

## 2019-02-17 DIAGNOSIS — J449 Chronic obstructive pulmonary disease, unspecified: Secondary | ICD-10-CM | POA: Insufficient documentation

## 2019-02-17 DIAGNOSIS — Z7982 Long term (current) use of aspirin: Secondary | ICD-10-CM | POA: Diagnosis not present

## 2019-02-17 DIAGNOSIS — D509 Iron deficiency anemia, unspecified: Secondary | ICD-10-CM | POA: Diagnosis not present

## 2019-02-17 DIAGNOSIS — Z85118 Personal history of other malignant neoplasm of bronchus and lung: Secondary | ICD-10-CM | POA: Diagnosis not present

## 2019-02-17 DIAGNOSIS — Z79899 Other long term (current) drug therapy: Secondary | ICD-10-CM | POA: Insufficient documentation

## 2019-02-17 DIAGNOSIS — Z87891 Personal history of nicotine dependence: Secondary | ICD-10-CM | POA: Diagnosis not present

## 2019-02-17 DIAGNOSIS — Z8673 Personal history of transient ischemic attack (TIA), and cerebral infarction without residual deficits: Secondary | ICD-10-CM | POA: Diagnosis not present

## 2019-02-17 DIAGNOSIS — Z902 Acquired absence of lung [part of]: Secondary | ICD-10-CM | POA: Diagnosis not present

## 2019-02-17 DIAGNOSIS — K5090 Crohn's disease, unspecified, without complications: Secondary | ICD-10-CM | POA: Insufficient documentation

## 2019-02-17 LAB — CBC WITH DIFFERENTIAL/PLATELET
Abs Immature Granulocytes: 0.06 10*3/uL (ref 0.00–0.07)
Basophils Absolute: 0 10*3/uL (ref 0.0–0.1)
Basophils Relative: 1 %
Eosinophils Absolute: 0.2 10*3/uL (ref 0.0–0.5)
Eosinophils Relative: 4 %
HCT: 37.5 % (ref 36.0–46.0)
Hemoglobin: 11.7 g/dL — ABNORMAL LOW (ref 12.0–15.0)
Immature Granulocytes: 1 %
Lymphocytes Relative: 19 %
Lymphs Abs: 1.3 10*3/uL (ref 0.7–4.0)
MCH: 28 pg (ref 26.0–34.0)
MCHC: 31.2 g/dL (ref 30.0–36.0)
MCV: 89.7 fL (ref 80.0–100.0)
Monocytes Absolute: 0.6 10*3/uL (ref 0.1–1.0)
Monocytes Relative: 9 %
Neutro Abs: 4.7 10*3/uL (ref 1.7–7.7)
Neutrophils Relative %: 66 %
Platelets: 409 10*3/uL — ABNORMAL HIGH (ref 150–400)
RBC: 4.18 MIL/uL (ref 3.87–5.11)
RDW: 14.5 % (ref 11.5–15.5)
WBC: 6.9 10*3/uL (ref 4.0–10.5)
nRBC: 0 % (ref 0.0–0.2)

## 2019-02-17 LAB — FERRITIN: Ferritin: 14 ng/mL (ref 11–307)

## 2019-02-17 NOTE — Progress Notes (Signed)
Confirmed Name and DOB. Denies any concerns.

## 2019-02-17 NOTE — Progress Notes (Signed)
[facility]  [address] [place], [place] [address] Phone: [phone]  Fax: [phone]   Clinic Day:  02/18/2019  Referring physician: Albina Billet, MD  Chief Complaint: Carol Shaffer is a 74 y.o. 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 At that time, she felt good. Exam was unremarkable.  She had received Feraheme on 11/20/2018 for a ferritin of 9.  She was referred to GI.  She saw Dr Allen Norris on 01/26/2019.  She was noted to have a history of an anastomotic stricture with multiple EGDs (03/2017, 08/2018, and 09/2018).  She had no relief after her last 2 EGDs.  She is scheduled for EGD with dilatation on 02/23/2019.  During the interim, she has been pretty good. She is very excited to have Dr. Allen Norris as part of her care team.  Symptomatically, her swallowing is fine. She was presacribed lomotil by Dr. Allen Norris.  Lomotil caused her to be impacted; she thought she was going to pass out from pain.  Her bowel movement are loose.  She will have diarrhea early in the morning, but then is fine for the rest of the day.  Lomotil helps with her collagenous colitis, but she has to change how she takes the medication.  She has abdominal pain but is steadily gaining weight.   She feel significantly better when she receives IV iron.    Past Medical History:  Diagnosis Date  . Arthritis    hands  . Avascular necrosis of hip, right ([place])   . Blepharospasm   . Cancer [facility]) 2011   Right Upper Lobe Lobectomy  . Chronic diarrhea   . Chronic diarrhea   . Collagenous colitis   . COPD (chronic obstructive pulmonary disease) ([place])   . Depression   . Diverticulosis   . Gastric outlet obstruction   . Headache    every couple of days  . Hemorrhoid   . History of Crohn's disease   . Hypoglycemic disorder   . IDA (iron deficiency anemia)   .  Intestinal adhesions   . Multiple gastric ulcers   . Multiple gastric ulcers   . 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[place])   . Stroke [facility]) 8 yrs ago   double vision left eye    Past Surgical History:  Procedure Laterality Date  . APPENDECTOMY  1989  . botox injections for blepharospasm    . BREAST SURGERY    . CATARACT EXTRACTION    . COLON RESECTION  2005  . COLONOSCOPY  10-29-2008   [name]  . COLONOSCOPY WITH PROPOFOL N/A 03/05/2017   Procedure: COLONOSCOPY WITH PROPOFOL;  Surgeon: Toledo, Benay Pike, MD;  Location: [facility] ENDOSCOPY;  Service: Gastroenterology;  Laterality: N/A;  . COLOSTOMY REVERSAL  2006  . ESOPHAGOGASTRODUODENOSCOPY (EGD) WITH PROPOFOL N/A 07/18/2016   Procedure: ESOPHAGOGASTRODUODENOSCOPY (EGD) WITH PROPOFOL;  Surgeon: Robert Bellow, MD;  Location: [facility] ENDOSCOPY;  Service: Endoscopy;  Laterality: N/A;  . ESOPHAGOGASTRODUODENOSCOPY (EGD) WITH PROPOFOL N/A 08/03/2016   Procedure: ESOPHAGOGASTRODUODENOSCOPY (EGD) WITH PROPOFOL;  Surgeon: Robert Bellow, MD;  Location: [facility] ENDOSCOPY;  Service: Endoscopy;  Laterality: N/A;  . ESOPHAGOGASTRODUODENOSCOPY (EGD) WITH PROPOFOL N/A 09/01/2018   Procedure: ESOPHAGOGASTRODUODENOSCOPY (EGD) WITH PROPOFOL;  Surgeon: Toledo, Benay Pike, MD;  Location: [facility] ENDOSCOPY;  Service: Gastroenterology;  Laterality: N/A;  . ESOPHAGOGASTRODUODENOSCOPY (EGD) WITH PROPOFOL N/A 12/01/2018   Procedure: ESOPHAGOGASTRODUODENOSCOPY (EGD) WITH PROPOFOL;  Surgeon: Toledo, Benay Pike, MD;  Location: [facility] ENDOSCOPY;  Service: Gastroenterology;  Laterality: N/A;  . ESOPHAGOSCOPY WITH DILITATION  2012,2015   [facility] [place]  . EYE SURGERY    . GASTRECTOMY    . gastric ulcer  1989, 1991  . JOINT REPLACEMENT     right total hip arthroplasty 03/03/02  . LAPAROSCOPIC LYSIS OF ADHESIONS    . LUNG CANCER SURGERY  2011  . LYSIS OF ADHESION    . PLACEMENT OF BREAST IMPLANTS  1973  . thoracotomy with right upper lobectomy and central compartment node  dissection    . UPPER GASTROINTESTINAL ENDOSCOPY  10-29-2008   [name]    History reviewed. No pertinent family history.  Social History:  reports that she quit smoking about 11 years ago. Her smoking use included cigarettes. She has a 25.00 pack-year smoking history. She has never used smokeless tobacco. She reports current alcohol use. She reports that she does not use drugs. She has a30pack-year smoking history (30 years x1ppd). She quit when she was diagnosed with lung cancer in 2012. She denies any known exposure to radiations or toxins. 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 The patient is alone today.  Allergies:  Allergies  Allergen Reactions  . Hydromorphone Hcl Itching  . Bentyl [Dicyclomine] Nausea And Vomiting  . Biaxin [Clarithromycin] Other (See Comments)    hallucinations  . Erythromycin Nausea And Vomiting  . Reglan [Metoclopramide] Other (See Comments)    tremors    Current Medications: Current Outpatient Medications  Medication Sig Dispense Refill  . acetaminophen (TYLENOL) 500 MG tablet Take 500 mg by mouth every 6 (six) hours as needed.    Marland Kitchen aspirin EC 81 MG tablet Take 81 mg by mouth daily.    . brimonidine-timolol (COMBIGAN) 0.2-0.5 % ophthalmic solution INT 1 GTT IN OU BID    . diphenoxylate-atropine (LOMOTIL) 2.5-0.025 MG tablet Take 1 tablet by mouth 4 (four) times daily as needed for diarrhea or loose stools. 60 tablet 5  . ferrous sulfate 325 (65 FE) MG tablet Take 325 mg by mouth every other day. Pt states every 4 days    . HYDROcodone-acetaminophen (NORCO/VICODIN) 5-325 MG per tablet Take 1 tablet by mouth every 6 (six) hours as needed.     . loratadine (CLARITIN) 10 MG tablet Take 10 mg by mouth daily.    . Multiple Vitamin (MULTI-VITAMINS) TABS Take 1 tablet by mouth daily. once daily.    . pantoprazole  (PROTONIX) 40 MG tablet Take 40 mg by mouth 2 (two) times daily.     . sucralfate (CARAFATE) 1 G tablet Take 1 g by mouth 4 (four) times daily.     . traZODone (DESYREL) 150 MG tablet Take 300 mg by mouth at bedtime.     Marland Kitchen venlafaxine XR (EFFEXOR-XR) 150 MG 24 hr capsule Take 150 mg by mouth daily with breakfast.     . ondansetron (ZOFRAN) 4 MG tablet Take 1 tablet (4 mg total) by mouth every 8 (eight) hours as needed for nausea or vomiting. (Patient not taking: Reported on 12/08/2018) 10 tablet 0   No current facility-administered medications for this visit.    Review of Systems  Constitutional: Negative.  Negative for chills, diaphoresis, fever, malaise/fatigue and weight loss (up 3 lbs).       Feels "pretty good".  HENT: Negative.  Negative for congestion, ear pain, hearing loss, nosebleeds, sinus pain and sore throat.   Eyes: Negative.  Negative for blurred vision, double vision and photophobia.  Respiratory: Positive for cough (in the mornings, related to allergies). Negative for hemoptysis, sputum production, shortness of breath and wheezing.   Cardiovascular: Negative.  Negative for chest pain, palpitations, claudication, leg swelling and PND.  Gastrointestinal: Positive for abdominal pain and diarrhea (see HPI). Negative for blood in stool, constipation, heartburn, melena, nausea and vomiting.  Genitourinary: Negative.  Negative for dysuria, frequency, hematuria and urgency.  Musculoskeletal: Negative.  Negative for back pain, joint pain and myalgias.  Skin: Negative.  Negative for rash.  Neurological: Negative.  Negative for dizziness, tingling, sensory change, speech change, focal weakness, weakness and headaches.  Endo/Heme/Allergies: Positive for environmental allergies (seasonal; on Claritin). Does not bruise/bleed easily.  Psychiatric/Behavioral: Negative.  Negative for depression and memory loss. The patient is not nervous/anxious and does not have insomnia.   All other systems  reviewed and are negative.  Performance status (ECOG):  1  Vitals Blood pressure 117/76, pulse 80, temperature 98.3 F (36.8 C), temperature source Tympanic, resp. rate 18, height 5' (1.524 m), weight 84 lb 8.7 oz (38.3 kg), SpO2 98 %.   Physical Exam  Constitutional: She is oriented to person, place, and time. She appears well-developed and well-nourished. No distress.  HENT:  Head: Normocephalic and atraumatic.  Mouth/Throat: Oropharynx is clear and moist. No oropharyngeal exudate.  Short light brown hair.  Mask.  Eyes: Pupils are equal, round, and reactive to light. Conjunctivae and EOM are normal. No scleral icterus.  Glasses.  Blue eyes.  Neck: No JVD present.  Cardiovascular: Normal rate, regular rhythm and normal heart sounds.  No murmur heard. Pulmonary/Chest: Effort normal and breath sounds normal. No respiratory distress. She has no wheezes. She has no rales. She exhibits no tenderness.  Abdominal: Soft. Bowel sounds are normal. She exhibits no distension and no mass. There is no abdominal tenderness. There is no rebound and no guarding.  Left sided abdominal wall weakness above incision.  Musculoskeletal:        General: No tenderness or edema.     Cervical back: Normal range of motion and neck supple.  Lymphadenopathy:       Head (right side): No preauricular, no posterior auricular and no occipital adenopathy present.       Head (left side): No preauricular, no posterior auricular and no occipital adenopathy present.    She has no cervical adenopathy.    She has no axillary adenopathy.       Right: No inguinal and no supraclavicular adenopathy present.       Left: No inguinal and no supraclavicular adenopathy present.  Neurological: She is alert and oriented to person, place, and time.  Skin: Skin is warm and dry. No rash noted. She is not diaphoretic. No erythema. No pallor.  Psychiatric: She has a normal mood and affect. Her behavior is normal. Judgment and thought  content normal.  Nursing note and vitals reviewed.   Appointment on 02/17/2019  Component Date Value Ref Range Status  . Ferritin 02/17/2019 14  11 - 307 ng/mL Final   Performed at [facility], [address] [place], [place] [address]  . WBC 02/17/2019 6.9  4.0 - 10.5 K/uL Final  . RBC 02/17/2019 4.18  3.87 - 5.11 MIL/uL Final  . Hemoglobin 02/17/2019 11.7* 12.0 - 15.0 g/dL Final  . HCT 02/17/2019 37.5  36.0 - 46.0 % Final  .  MCV 02/17/2019 89.7  80.0 - 100.0 fL Final  . MCH 02/17/2019 28.0  26.0 - 34.0 pg Final  . MCHC 02/17/2019 31.2  30.0 - 36.0 g/dL Final  . RDW 02/17/2019 14.5  11.5 - 15.5 % Final  . Platelets 02/17/2019 409* 150 - 400 K/uL Final  . nRBC 02/17/2019 0.0  0.0 - 0.2 % Final  . Neutrophils Relative % 02/17/2019 66  % Final  . Neutro Abs 02/17/2019 4.7  1.7 - 7.7 K/uL Final  . Lymphocytes Relative 02/17/2019 19  % Final  . Lymphs Abs 02/17/2019 1.3  0.7 - 4.0 K/uL Final  . Monocytes Relative 02/17/2019 9  % Final  . Monocytes Absolute 02/17/2019 0.6  0.1 - 1.0 K/uL Final  . Eosinophils Relative 02/17/2019 4  % Final  . Eosinophils Absolute 02/17/2019 0.2  0.0 - 0.5 K/uL Final  . Basophils Relative 02/17/2019 1  % Final  . Basophils Absolute 02/17/2019 0.0  0.0 - 0.1 K/uL Final  . Immature Granulocytes 02/17/2019 1  % Final  . Abs Immature Granulocytes 02/17/2019 0.06  0.00 - 0.07 K/uL Final   Performed at [facility], [address], [place], [place] [address]    Assessment:  Carol Shaffer is a 74 y.o. female withhyopgammaglobulinemiaand iron deficiency anemia.IgG levelshas beenin the low 300s.M-proteinis 0.She has not receivedIVIG.She has not had issues with recurrent infections.  Shehas collagenous colitisand is on budesonide. She has a history of peptic ulcer disease and is s/pgastrojejunostomyin 1980s. She had asmall bowel obstruction and colonic perforation in the pasts/presection  colostomy.Shehaschronicabdominalpain.  EGDon 09/01/2018 revealed esophageal plaques suspicious of candidiasis (KOH -). There was a stenosed Billroth I gastroduodenostomy, characterized by ulceration and an intact staple line. Lesionwasnot amenable to dilation.Jejunumpathology revealedenteric mucosa with pyloric metaplasiaand negative for active inflammation, anddysplasia. Stomach, pouchrevealedreactive gastropathy, negative for active inflammation,H pylori,intestinal metaplasia, dysplasia, and malignancy.EGD on 12/01/2018 revealed a normal esophagus. There was patent Billroth I gastroduodenostomy, characterized by erythema (reactive gastropathy). There was a 1 cm hiatal hernia. There was suspected jejunal inflammation characterized by erosions and friability. 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    She has a history of lung cancerin 2012 s/p right upper loberesection.Low dose chest CTon 09/04/2018 revealed multiplesmall pulmonary nodules are noted in the lungs bilaterally. In addition, therewasa 1.53 cmconcerning nodule in the right lower lobe. There was diffuse bronchial wall thickening with mild centrilobular and paraseptal emphysema; imaging findings suggestive of underlying COPD.   PET scanon 09/16/2018 revealed low level FDG uptake associated with the right lower lobe lung nodule(SUV 1.57). Thiswasa nonspecific finding. Given the size of this nodule and the low level uptake,findings may reflect inflammatory or infectious nodule. Malignancy wasnot entirely excluded.   She has iron deficiency anemia.She received Ferahemex2(08/12/2017- 08/19/2017), 08/20/2018, and 11/18/2018.Oral ironhas been ineffective in the past  Ferritin has been followed: 9 on 05/07/2017, 14 on 07/15/2017, 66 on 11/07/2017, 12 on 02/12/2018, 7 on 08/13/2018, 9 on 11/12/2018, and 14 on 02/17/2019.  Symptomatically, she feels "pretty good". Energy level is a little low.  She has loose stools  in the AM.  She denies any melena or hematochezia.  Plan: 1.   Review labs from 02/17/2019.  2.   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 SPEPrevealed no monoclonal protein. If issues with infections, consider immunology referral. 3. Iron deficiency Hematocrit 37.5.  Hemoglobin 11.7.  MCV 89.7.   Ferritin 14.  Patient receives IV iron if ferritin < 30.  Feraheme today.  Patient is followed by Dr Allen Norris in [place]. 4.History of lung cancer She iss/p RUL lobectomy 2012. Low dose chest CT on 09/04/2018 revealedmultiplesmall pulmonary nodules bilaterallyanda 1.53 cmnodule in the right lower lobe. PET scan on 09/16/2018 revealedlow level FDG uptake associated with the right lower lobe lung nodule(SUV 1.57), nonspecific finding.             Chest CT on 12/04/2018 revealed a stable 1.53 cm RLL nodule.               Chest CT scheduled for 06/03/2019.   If chest CT stable, anticipate follow-up imaging in 1 year. 5.  Feraheme today. 6.   RTC in 3 months for labs (CBC, ferritin). 7.   RTC in 6 months for MD assessment, labs (CBC with diff, ferritin-day before), review of chest CT (already scheduled 06/03/2019) and +/- Feraheme.  I discussed the assessment and treatment plan with the patient.  The patient was provided an opportunity to ask questions and all were answered.  The patient agreed with the plan and demonstrated an understanding of the instructions.  The patient was advised to call back if the symptoms worsen or if the condition fails to improve as anticipated.   Lequita Asal, MD, PhD    02/18/2019, 10:43 AM  I, Samul Dada, am acting as a scribe for Lequita Asal, MD.  I, [place] Mike Gip, MD, have reviewed the above documentation for accuracy and completeness, and I agree with the above.

## 2019-02-18 ENCOUNTER — Inpatient Hospital Stay (HOSPITAL_BASED_OUTPATIENT_CLINIC_OR_DEPARTMENT_OTHER): Payer: Medicare Other | Admitting: Hematology and Oncology

## 2019-02-18 ENCOUNTER — Inpatient Hospital Stay: Payer: Medicare Other

## 2019-02-18 ENCOUNTER — Encounter: Payer: Self-pay | Admitting: Hematology and Oncology

## 2019-02-18 VITALS — BP 117/76 | HR 80 | Temp 98.3°F | Resp 18 | Ht 60.0 in | Wt 84.5 lb

## 2019-02-18 VITALS — BP 119/74 | HR 76 | Resp 18

## 2019-02-18 DIAGNOSIS — Z8673 Personal history of transient ischemic attack (TIA), and cerebral infarction without residual deficits: Secondary | ICD-10-CM

## 2019-02-18 DIAGNOSIS — R911 Solitary pulmonary nodule: Secondary | ICD-10-CM

## 2019-02-18 DIAGNOSIS — K52831 Collagenous colitis: Secondary | ICD-10-CM

## 2019-02-18 DIAGNOSIS — K5090 Crohn's disease, unspecified, without complications: Secondary | ICD-10-CM

## 2019-02-18 DIAGNOSIS — Z902 Acquired absence of lung [part of]: Secondary | ICD-10-CM

## 2019-02-18 DIAGNOSIS — D801 Nonfamilial hypogammaglobulinemia: Secondary | ICD-10-CM

## 2019-02-18 DIAGNOSIS — J449 Chronic obstructive pulmonary disease, unspecified: Secondary | ICD-10-CM

## 2019-02-18 DIAGNOSIS — Z87891 Personal history of nicotine dependence: Secondary | ICD-10-CM

## 2019-02-18 DIAGNOSIS — D509 Iron deficiency anemia, unspecified: Secondary | ICD-10-CM

## 2019-02-18 DIAGNOSIS — Z982 Presence of cerebrospinal fluid drainage device: Secondary | ICD-10-CM

## 2019-02-18 DIAGNOSIS — F329 Major depressive disorder, single episode, unspecified: Secondary | ICD-10-CM

## 2019-02-18 DIAGNOSIS — Z79899 Other long term (current) drug therapy: Secondary | ICD-10-CM

## 2019-02-18 DIAGNOSIS — Z85118 Personal history of other malignant neoplasm of bronchus and lung: Secondary | ICD-10-CM | POA: Diagnosis not present

## 2019-02-18 MED ORDER — SODIUM CHLORIDE 0.9 % IV SOLN
510.0000 mg | Freq: Once | INTRAVENOUS | Status: AC
  Administered 2019-02-18: 510 mg via INTRAVENOUS
  Filled 2019-02-18: qty 17

## 2019-02-18 MED ORDER — SODIUM CHLORIDE 0.9 % IV SOLN
Freq: Once | INTRAVENOUS | Status: AC
  Filled 2019-02-18: qty 250

## 2019-02-18 NOTE — Progress Notes (Signed)
The patient c/o having abdomen pain today due to impaction / constipation ( pain level 5 ) she was able to relieve herself last week.

## 2019-02-18 NOTE — Patient Instructions (Signed)

## 2019-02-19 ENCOUNTER — Other Ambulatory Visit
Admission: RE | Admit: 2019-02-19 | Discharge: 2019-02-19 | Disposition: A | Discharge status: Home / Self Care | Payer: Medicare Other | Source: Ambulatory Visit | Attending: Gastroenterology | Admitting: Gastroenterology

## 2019-02-19 DIAGNOSIS — Z01812 Encounter for preprocedural laboratory examination: Secondary | ICD-10-CM | POA: Diagnosis present

## 2019-02-19 DIAGNOSIS — Z20822 Contact with and (suspected) exposure to covid-19: Secondary | ICD-10-CM | POA: Diagnosis not present

## 2019-02-19 LAB — SARS CORONAVIRUS 2 (TAT 6-24 HRS): SARS Coronavirus 2: NEGATIVE

## 2019-02-19 NOTE — Discharge Instructions (Signed)
General Anesthesia, Adult, Care After This sheet gives you information about how to care for yourself after your procedure. Your health care provider may also give you more specific instructions. If you have problems or questions, contact your health care provider. What can I expect after the procedure? After the procedure, the following side effects are common:  Pain or discomfort at the IV site.  Nausea.  Vomiting.  Sore throat.  Trouble concentrating.  Feeling cold or chills.  Weak or tired.  Sleepiness and fatigue.  Soreness and body aches. These side effects can affect parts of the body that were not involved in surgery. Follow these instructions at home:  For at least 24 hours after the procedure:  Have a responsible adult stay with you. It is important to have someone help care for you until you are awake and alert.  Rest as needed.  Do not: ? Participate in activities in which you could fall or become injured. ? Drive. ? Use heavy machinery. ? Drink alcohol. ? Take sleeping pills or medicines that cause drowsiness. ? Make important decisions or sign legal documents. ? Take care of children on your own. Eating and drinking  Follow any instructions from your health care provider about eating or drinking restrictions.  When you feel hungry, start by eating small amounts of foods that are soft and easy to digest (bland), such as toast. Gradually return to your regular diet.  Drink enough fluid to keep your urine pale yellow.  If you vomit, rehydrate by drinking water, juice, or clear broth. General instructions  If you have sleep apnea, surgery and certain medicines can increase your risk for breathing problems. Follow instructions from your health care provider about wearing your sleep device: ? Anytime you are sleeping, including during daytime naps. ? While taking prescription pain medicines, sleeping medicines, or medicines that make you drowsy.  Return to  your normal activities as told by your health care provider. Ask your health care provider what activities are safe for you.  Take over-the-counter and prescription medicines only as told by your health care provider.  If you smoke, do not smoke without supervision.  Keep all follow-up visits as told by your health care provider. This is important. Contact a health care provider if:  You have nausea or vomiting that does not get better with medicine.  You cannot eat or drink without vomiting.  You have pain that does not get better with medicine.  You are unable to pass urine.  You develop a skin rash.  You have a fever.  You have redness around your IV site that gets worse. Get help right away if:  You have difficulty breathing.  You have chest pain.  You have blood in your urine or stool, or you vomit blood. Summary  After the procedure, it is common to have a sore throat or nausea. It is also common to feel tired.  Have a responsible adult stay with you for the first 24 hours after general anesthesia. It is important to have someone help care for you until you are awake and alert.  When you feel hungry, start by eating small amounts of foods that are soft and easy to digest (bland), such as toast. Gradually return to your regular diet.  Drink enough fluid to keep your urine pale yellow.  Return to your normal activities as told by your health care provider. Ask your health care provider what activities are safe for you. This information is not   intended to replace advice given to you by your health care provider. Make sure you discuss any questions you have with your health care provider. Document Revised: 01/18/2017 Document Reviewed: 08/31/2016 Elsevier Patient Education  2020 [employer]  

## 2019-02-23 ENCOUNTER — Ambulatory Visit: Payer: Medicare Other | Admitting: Anesthesiology

## 2019-02-23 ENCOUNTER — Ambulatory Visit
Admission: RE | Admit: 2019-02-23 | Discharge: 2019-02-23 | Disposition: A | Discharge status: Home / Self Care | Payer: Medicare Other | Attending: Gastroenterology | Admitting: Gastroenterology

## 2019-02-23 ENCOUNTER — Encounter
Admission: RE | Disposition: A | Discharge status: Home / Self Care | Payer: Self-pay | Source: Home / Self Care | Attending: Gastroenterology

## 2019-02-23 ENCOUNTER — Encounter: Payer: Self-pay | Admitting: Gastroenterology

## 2019-02-23 ENCOUNTER — Other Ambulatory Visit: Payer: Self-pay

## 2019-02-23 DIAGNOSIS — K9189 Other postprocedural complications and disorders of digestive system: Secondary | ICD-10-CM | POA: Diagnosis not present

## 2019-02-23 DIAGNOSIS — Y832 Surgical operation with anastomosis, bypass or graft as the cause of abnormal reaction of the patient, or of later complication, without mention of misadventure at the time of the procedure: Secondary | ICD-10-CM | POA: Diagnosis not present

## 2019-02-23 DIAGNOSIS — R1310 Dysphagia, unspecified: Secondary | ICD-10-CM | POA: Diagnosis present

## 2019-02-23 DIAGNOSIS — Z7982 Long term (current) use of aspirin: Secondary | ICD-10-CM | POA: Diagnosis not present

## 2019-02-23 DIAGNOSIS — Z87891 Personal history of nicotine dependence: Secondary | ICD-10-CM | POA: Insufficient documentation

## 2019-02-23 DIAGNOSIS — J449 Chronic obstructive pulmonary disease, unspecified: Secondary | ICD-10-CM | POA: Diagnosis not present

## 2019-02-23 DIAGNOSIS — Z8673 Personal history of transient ischemic attack (TIA), and cerebral infarction without residual deficits: Secondary | ICD-10-CM | POA: Diagnosis not present

## 2019-02-23 DIAGNOSIS — Z96641 Presence of right artificial hip joint: Secondary | ICD-10-CM | POA: Insufficient documentation

## 2019-02-23 DIAGNOSIS — X58XXXA Exposure to other specified factors, initial encounter: Secondary | ICD-10-CM | POA: Diagnosis not present

## 2019-02-23 DIAGNOSIS — D649 Anemia, unspecified: Secondary | ICD-10-CM | POA: Insufficient documentation

## 2019-02-23 DIAGNOSIS — T182XXA Foreign body in stomach, initial encounter: Secondary | ICD-10-CM | POA: Diagnosis not present

## 2019-02-23 DIAGNOSIS — Z79899 Other long term (current) drug therapy: Secondary | ICD-10-CM | POA: Diagnosis not present

## 2019-02-23 DIAGNOSIS — Z980 Intestinal bypass and anastomosis status: Secondary | ICD-10-CM | POA: Insufficient documentation

## 2019-02-23 DIAGNOSIS — Z85118 Personal history of other malignant neoplasm of bronchus and lung: Secondary | ICD-10-CM | POA: Insufficient documentation

## 2019-02-23 DIAGNOSIS — K52831 Collagenous colitis: Secondary | ICD-10-CM | POA: Diagnosis not present

## 2019-02-23 DIAGNOSIS — K5090 Crohn's disease, unspecified, without complications: Secondary | ICD-10-CM | POA: Diagnosis not present

## 2019-02-23 DIAGNOSIS — K913 Postprocedural intestinal obstruction, unspecified as to partial versus complete: Secondary | ICD-10-CM | POA: Insufficient documentation

## 2019-02-23 HISTORY — DX: Depression, unspecified: F32.A

## 2019-02-23 HISTORY — DX: Hypoglycemia, unspecified: E16.2

## 2019-02-23 HISTORY — PX: ESOPHAGOGASTRODUODENOSCOPY (EGD) WITH PROPOFOL: SHX5813

## 2019-02-23 HISTORY — DX: Headache, unspecified: R51.9

## 2019-02-23 SURGERY — ESOPHAGOGASTRODUODENOSCOPY (EGD) WITH PROPOFOL
Anesthesia: General | Site: Mouth

## 2019-02-23 MED ORDER — LIDOCAINE HCL (CARDIAC) PF 100 MG/5ML IV SOSY
PREFILLED_SYRINGE | INTRAVENOUS | Status: DC | PRN
  Administered 2019-02-23: 30 mg via INTRAVENOUS

## 2019-02-23 MED ORDER — PROPOFOL 10 MG/ML IV BOLUS
INTRAVENOUS | Status: DC | PRN
  Administered 2019-02-23: 70 mg via INTRAVENOUS

## 2019-02-23 MED ORDER — GLYCOPYRROLATE 0.2 MG/ML IJ SOLN
INTRAMUSCULAR | Status: DC | PRN
  Administered 2019-02-23: .1 mg via INTRAVENOUS

## 2019-02-23 MED ORDER — LACTATED RINGERS IV SOLN: 10.0000 mL/h | INTRAVENOUS | Status: DC

## 2019-02-23 SURGICAL SUPPLY — 7 items
BLOCK BITE 60FR ADLT L/F GRN (MISCELLANEOUS) ×3 IMPLANT
CANISTER SUCT 1200ML W/VALVE (MISCELLANEOUS) ×3 IMPLANT
FORCEPS BIOP RAD 4 LRG CAP 4 (CUTTING FORCEPS) ×3 IMPLANT
GOWN CVR UNV OPN BCK APRN NK (MISCELLANEOUS) ×2 IMPLANT
GOWN ISOL THUMB LOOP REG UNIV (MISCELLANEOUS) ×4
KIT ENDO PROCEDURE OLY (KITS) ×3 IMPLANT
WATER STERILE IRR 250ML POUR (IV SOLUTION) ×3 IMPLANT

## 2019-02-23 NOTE — Anesthesia Preprocedure Evaluation (Signed)
Anesthesia Evaluation  Patient identified by MRN, date of birth, ID band Patient awake    History of Anesthesia Complications Negative for: history of anesthetic complications  Airway Mallampati: II  TM Distance: >3 FB Neck ROM: Full    Dental no notable dental hx.    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   Endo/Other  negative endocrine ROS  Renal/GU negative Renal ROS     Musculoskeletal   Abdominal   Peds  Hematology  (+) anemia , hypogammaglobulinemia   Anesthesia Other Findings   Reproductive/Obstetrics                             Anesthesia Physical Anesthesia Plan  ASA: III  Anesthesia Plan: General   Post-op Pain Management:    Induction: Intravenous  PONV Risk Score and Plan: 3 and TIVA, Propofol infusion and Treatment may vary due to age or medical condition  Airway Management Planned: Natural Airway and Nasal Cannula  Additional Equipment: None  Intra-op Plan:   Post-operative Plan:   Informed Consent: I have reviewed the patients History and Physical, chart, labs and discussed the procedure including the risks, benefits and alternatives for the proposed anesthesia with the patient or authorized representative who has indicated his/her understanding and acceptance.       Plan Discussed with: CRNA  Anesthesia Plan Comments:         Anesthesia Quick Evaluation

## 2019-02-23 NOTE — Op Note (Signed)
[facility] Patient Name: Carol Shaffer Procedure Date: 02/23/2019 7:13 AM MRN: [id number] Account #: [id number] Date of Birth: 04/23/1945 Admit Type: Outpatient Age: 74 Room: [facility] OR ROOM 01 Gender: Female Note Status: Finalized Procedure:             Upper GI endoscopy Indications:           Dysphagia Providers:             Lucilla Lame MD, MD Referring MD:          Leona Carry. Hall Busing, MD (Referring MD) Medicines:             Propofol per Anesthesia Complications:         No immediate complications. Procedure:             Pre-Anesthesia Assessment:                        - Prior to the procedure, a History and Physical was                         performed, and patient medications and allergies were                         reviewed. The patient's tolerance of previous                         anesthesia was also reviewed. The risks and benefits                         of the procedure and the sedation options and risks                         were discussed with the patient. All questions were                         answered, and informed consent was obtained. Prior                         Anticoagulants: The patient has taken no previous                         anticoagulant or antiplatelet agents. ASA Grade                         Assessment: II - A patient with mild systemic disease.                         After reviewing the risks and benefits, the patient                         was deemed in satisfactory condition to undergo the                         procedure.                        After obtaining informed consent, the endoscope was  passed under direct vision. Throughout the procedure,                         the patient's blood pressure, pulse, and oxygen                         saturations were monitored continuously. The Endoscope                         was introduced through the mouth, and advanced to the                    third part of duodenum. The upper GI endoscopy was                         accomplished without difficulty. The patient tolerated                         the procedure well. Findings:      The examined esophagus was normal.      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 The gastroduodenal       anastomosis was characterized by ulceration. This was traversed.       Biopsies were taken with a cold forceps for histology.      The in the duodenum was normal. Impression:            - Normal esophagus.                        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 Biopsied.                        - Normal.                        - The anastomotic stricture was not dilated due to the                         large ulcer.                        The ulcer was friable. Recommendation:        - Discharge patient to home.                        - Resume previous diet.                        - Continue present medications.                        - Await pathology results.                        - Use a proton pump inhibitor PO daily. Procedure Code(s):     --- Professional ---  51460, Esophagogastroduodenoscopy, flexible,                         transoral; with biopsy, single or multiple Diagnosis Code(s):     --- Professional ---                        R13.10, Dysphagia, unspecified CPT copyright 2019 American Medical Association. All rights reserved. The codes documented in this report are preliminary and upon coder review may  be revised to meet current compliance requirements. Lucilla Lame MD, MD 02/23/2019 7:45:07 AM This report has been signed electronically. Number of Addenda: 0 Note Initiated On: 02/23/2019 7:13 AM Total Procedure Duration: 0 hours 3 minutes 35 seconds  Estimated Blood Loss:  Estimated blood loss:  none.      [facility]

## 2019-02-23 NOTE — Interval H&P Note (Signed)
History and Physical Interval Note:  02/23/2019 7:19 AM  Carol Shaffer  has presented today for surgery, with the diagnosis of Dysphagia R13.10.  The various methods of treatment have been discussed with the patient and family. After consideration of risks, benefits and other options for treatment, the patient has consented to  Procedure(s): ESOPHAGOGASTRODUODENOSCOPY (EGD) WITH PROPOFOL (N/A) as a surgical intervention.  The patient's history has been reviewed, patient examined, no change in status, stable for surgery.  I have reviewed the patient's chart and labs.  Questions were answered to the patient's satisfaction.     Carol Shaffer [employer]

## 2019-02-23 NOTE — Anesthesia Postprocedure Evaluation (Signed)
Anesthesia Post Note  Patient: Carol Shaffer  Procedure(s) Performed: ESOPHAGOGASTRODUODENOSCOPY (EGD) WITH PROPOFOL (N/A Mouth)     Patient location during evaluation: PACU Anesthesia Type: General Level of consciousness: awake and alert Pain management: pain level controlled Vital Signs Assessment: post-procedure vital signs reviewed and stable Respiratory status: spontaneous breathing, nonlabored ventilation, respiratory function stable and patient connected to nasal cannula oxygen Cardiovascular status: blood pressure returned to baseline and stable Postop Assessment: no apparent nausea or vomiting Anesthetic complications: no    Adele Barthel [employer]

## 2019-02-23 NOTE — Transfer of Care (Signed)
Immediate Anesthesia Transfer of Care Note  Patient: Carol Shaffer  Procedure(s) Performed: ESOPHAGOGASTRODUODENOSCOPY (EGD) WITH PROPOFOL (N/A Mouth)  Patient Location: PACU  Anesthesia Type: General  Level of Consciousness: awake, alert  and patient cooperative  Airway and Oxygen Therapy: Patient Spontanous Breathing and Patient connected to supplemental oxygen  Post-op Assessment: Post-op Vital signs reviewed, Patient's Cardiovascular Status Stable, Respiratory Function Stable, Patent Airway and No signs of Nausea or vomiting  Post-op Vital Signs: Reviewed and stable  Complications: No apparent anesthesia complications

## 2019-02-23 NOTE — Anesthesia Procedure Notes (Signed)
Date/Time: 02/23/2019 7:32 AM Performed by: Cameron Ali, CRNA Pre-anesthesia Checklist: Patient identified, Emergency Drugs available, Suction available, Timeout performed and Patient being monitored Patient Re-evaluated:Patient Re-evaluated prior to induction Oxygen Delivery Method: Nasal cannula Placement Confirmation: positive ETCO2

## 2019-02-24 ENCOUNTER — Encounter: Payer: Self-pay | Admitting: *Deleted

## 2019-03-03 ENCOUNTER — Encounter: Payer: Self-pay | Admitting: Gastroenterology

## 2019-05-18 ENCOUNTER — Inpatient Hospital Stay: Payer: Medicare Other | Attending: Hematology and Oncology

## 2019-05-18 ENCOUNTER — Telehealth: Payer: Self-pay

## 2019-05-18 ENCOUNTER — Other Ambulatory Visit: Payer: Self-pay | Admitting: *Deleted

## 2019-05-18 DIAGNOSIS — D509 Iron deficiency anemia, unspecified: Secondary | ICD-10-CM | POA: Diagnosis not present

## 2019-05-18 DIAGNOSIS — Z79899 Other long term (current) drug therapy: Secondary | ICD-10-CM | POA: Diagnosis not present

## 2019-05-18 DIAGNOSIS — R911 Solitary pulmonary nodule: Secondary | ICD-10-CM

## 2019-05-18 DIAGNOSIS — Z7982 Long term (current) use of aspirin: Secondary | ICD-10-CM | POA: Diagnosis not present

## 2019-05-18 DIAGNOSIS — D801 Nonfamilial hypogammaglobulinemia: Secondary | ICD-10-CM | POA: Diagnosis not present

## 2019-05-18 DIAGNOSIS — Z85118 Personal history of other malignant neoplasm of bronchus and lung: Secondary | ICD-10-CM

## 2019-05-18 LAB — CBC WITH DIFFERENTIAL/PLATELET
Abs Immature Granulocytes: 0.02 10*3/uL (ref 0.00–0.07)
Basophils Absolute: 0 10*3/uL (ref 0.0–0.1)
Basophils Relative: 1 %
Eosinophils Absolute: 0.2 10*3/uL (ref 0.0–0.5)
Eosinophils Relative: 3 %
HCT: 38.8 % (ref 36.0–46.0)
Hemoglobin: 12.3 g/dL (ref 12.0–15.0)
Immature Granulocytes: 0 %
Lymphocytes Relative: 18 %
Lymphs Abs: 1 10*3/uL (ref 0.7–4.0)
MCH: 28.7 pg (ref 26.0–34.0)
MCHC: 31.7 g/dL (ref 30.0–36.0)
MCV: 90.7 fL (ref 80.0–100.0)
Monocytes Absolute: 0.6 10*3/uL (ref 0.1–1.0)
Monocytes Relative: 11 %
Neutro Abs: 3.8 10*3/uL (ref 1.7–7.7)
Neutrophils Relative %: 67 %
Platelets: 314 10*3/uL (ref 150–400)
RBC: 4.28 MIL/uL (ref 3.87–5.11)
RDW: 15 % (ref 11.5–15.5)
WBC: 5.6 10*3/uL (ref 4.0–10.5)
nRBC: 0 % (ref 0.0–0.2)

## 2019-05-18 LAB — SAMPLE TO BLOOD BANK

## 2019-05-18 LAB — FERRITIN: Ferritin: 15 ng/mL (ref 11–307)

## 2019-05-18 NOTE — Telephone Encounter (Signed)
Patient requested call to inform her of CBC results. With permission, VM was left informing patient CBC results looked good and that we were still waiting on other results. Number provided should patient have any further questions.

## 2019-05-19 ENCOUNTER — Telehealth: Payer: Self-pay

## 2019-05-19 NOTE — Telephone Encounter (Signed)
Left a message  to inform her that her ferritin was low at 15 but her Hbg is normal. Per Dr Mike Gip the patient can  receive Feraheme if she is < 30. I have also included if she have any question to contact the office at [phone].

## 2019-06-03 ENCOUNTER — Telehealth: Payer: Self-pay | Admitting: *Deleted

## 2019-06-03 ENCOUNTER — Other Ambulatory Visit: Payer: Self-pay

## 2019-06-03 ENCOUNTER — Ambulatory Visit
Admission: RE | Admit: 2019-06-03 | Discharge: 2019-06-03 | Disposition: A | Discharge status: Home / Self Care | Payer: Medicare Other | Source: Ambulatory Visit | Attending: Hematology and Oncology | Admitting: Hematology and Oncology

## 2019-06-03 ENCOUNTER — Inpatient Hospital Stay: Payer: Medicare Other | Attending: Hematology and Oncology

## 2019-06-03 ENCOUNTER — Other Ambulatory Visit: Payer: Medicare Other

## 2019-06-03 DIAGNOSIS — D801 Nonfamilial hypogammaglobulinemia: Secondary | ICD-10-CM | POA: Diagnosis not present

## 2019-06-03 DIAGNOSIS — R911 Solitary pulmonary nodule: Secondary | ICD-10-CM | POA: Insufficient documentation

## 2019-06-03 DIAGNOSIS — Z902 Acquired absence of lung [part of]: Secondary | ICD-10-CM | POA: Insufficient documentation

## 2019-06-03 DIAGNOSIS — K5090 Crohn's disease, unspecified, without complications: Secondary | ICD-10-CM | POA: Diagnosis not present

## 2019-06-03 DIAGNOSIS — Z933 Colostomy status: Secondary | ICD-10-CM | POA: Insufficient documentation

## 2019-06-03 DIAGNOSIS — Z87891 Personal history of nicotine dependence: Secondary | ICD-10-CM | POA: Insufficient documentation

## 2019-06-03 DIAGNOSIS — D509 Iron deficiency anemia, unspecified: Secondary | ICD-10-CM | POA: Insufficient documentation

## 2019-06-03 DIAGNOSIS — F329 Major depressive disorder, single episode, unspecified: Secondary | ICD-10-CM | POA: Insufficient documentation

## 2019-06-03 DIAGNOSIS — Z79899 Other long term (current) drug therapy: Secondary | ICD-10-CM | POA: Diagnosis not present

## 2019-06-03 DIAGNOSIS — Z85118 Personal history of other malignant neoplasm of bronchus and lung: Secondary | ICD-10-CM | POA: Diagnosis present

## 2019-06-03 DIAGNOSIS — Z8673 Personal history of transient ischemic attack (TIA), and cerebral infarction without residual deficits: Secondary | ICD-10-CM | POA: Insufficient documentation

## 2019-06-03 DIAGNOSIS — Z7982 Long term (current) use of aspirin: Secondary | ICD-10-CM | POA: Insufficient documentation

## 2019-06-03 DIAGNOSIS — J449 Chronic obstructive pulmonary disease, unspecified: Secondary | ICD-10-CM | POA: Insufficient documentation

## 2019-06-03 LAB — CREATININE, SERUM
Creatinine, Ser: 0.67 mg/dL (ref 0.44–1.00)
GFR calc Af Amer: >60 mL/min (ref >60)
GFR calc non Af Amer: >60 mL/min (ref >60)

## 2019-06-03 NOTE — Telephone Encounter (Signed)
Called report  IMPRESSION: 1. 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 This is compatible with a low level metabolic activity noted on the prior PET-CT. Consultation with Pulmonology or Thoracic Surgery recommended. 2. Aortic atherosclerosis, in addition to 2 vessel coronary artery disease. Assessment for potential risk factor modification, dietary therapy or pharmacologic therapy may be warranted, if clinically indicated. 3. Additional incidental findings, as above.  These results will be called to the ordering clinician or representative by the Radiologist Assistant, and communication documented in the PACS or [employer].  Aortic Atherosclerosis (ICD10-I70.0).   Electronically Signed   By: Vinnie Langton M.D.   On: 06/03/2019 16:23

## 2019-06-04 ENCOUNTER — Other Ambulatory Visit: Payer: Self-pay | Admitting: *Deleted

## 2019-06-04 ENCOUNTER — Ambulatory Visit: Payer: Medicare Other | Admitting: Hematology and Oncology

## 2019-06-04 ENCOUNTER — Ambulatory Visit: Payer: Medicare Other

## 2019-06-04 NOTE — Telephone Encounter (Signed)
Pt's next follow up is scheduled for July.   Dr. Mike Gip, do you want to see her sooner? She had prior PET Aug 2020.   Just let me know if I need to help schedule pt for any appts.

## 2019-06-04 NOTE — Telephone Encounter (Signed)
  I sent a message to [employer] and [employer].  She will be on tumor board next week.  We see her for a video visit tomorrow.  Thanks again.  M

## 2019-06-04 NOTE — Progress Notes (Signed)
[facility]  [address] [place], [place] [address] Phone: [phone]  Fax: [phone]   Telemedicine Office Visit:  06/05/2019  Referring physician: Albina Billet, MD  I connected with Carol Shaffer on 06/05/19 at 11:26 AM by videoconferencing and verified that I was speaking with the correct person using 2 identifiers.  The patient was at home.  I discussed the limitations, risk, security and privacy concerns of performing an evaluation and management service by videoconferencing and the availability of in person appointments.  I also discussed with the patient that there may be a patient responsible charge related to this service.  The patient expressed understanding and agreed to proceed.   Chief Complaint: Carol Shaffer is a 74 y.o. female with a history of lung cancer(2012) s/p right upper loberesection, right lower lobe lung nodule, hypogammaglobulinemia,and iron deficiency anemia who is seen for review of interval chest CT and discussion regarding direction of therapy.  HPI: The patient was last seen in the hematology clinic on 02/18/2019. At that time, she felt "pretty good".  Energy level was a little low.  She had loose stools in the AM.  She denied any melena or hematochezia. Hematocrit was 37.5, hemoglobin 11.7, and MCV 89.7.  Ferritin was 14.  She received Feraheme.  EGD on 02/23/2019 by Dr. Allen Norris revealed a normal esophagus.  There was evidence of a stenosed Billroth I gastroduodenostomy.  Gastric pouch contained a bezoar. The gastroduodenal anastomosis was characterized by ulceration. The duodenum was normal.  Gastric biopsy revealed reactive gastropathy with reparative changes and focal scattered neuroendocrine aggregates. There was no H pylori or dysplasia. She notes a follow up in 3 months for repeat endoscopy.   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  There were some ground-glass attenuation components, but is predominantly solid in appearance with some internal air bronchograms, with macrolobulated and spiculated borders, clearly increased in size. Consultation with pulmonology or thoracic surgery was recommended.  During the interim, she has felt well. She notes that she always feels better after receiving Feraheme. She is currently in the process of moving to [place] and selling her house in [place].    Past Medical History:  Diagnosis Date  . Arthritis    hands  . Avascular necrosis of hip, right ([place])   . Blepharospasm   . Cancer [facility]) 2011   Right Upper Lobe Lobectomy  . Chronic diarrhea   . Chronic diarrhea   . Collagenous colitis   . COPD (chronic obstructive pulmonary disease) ([place])   . Depression   . Diverticulosis   . Gastric outlet obstruction   . Headache    every couple of days  . Hemorrhoid   . History of Crohn's disease   . Hypoglycemic disorder   . IDA (iron deficiency anemia)   . Intestinal adhesions   . Multiple gastric ulcers   . Multiple gastric ulcers   . 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[place])   . Stroke [facility]) 8 yrs ago   double vision left eye    Past Surgical History:  Procedure Laterality Date  . APPENDECTOMY  1989  . botox injections for blepharospasm    . BREAST SURGERY    . CATARACT EXTRACTION    . COLON RESECTION  2005  . COLONOSCOPY  10-29-2008   [name]  . COLONOSCOPY WITH  PROPOFOL N/A 03/05/2017   Procedure: COLONOSCOPY WITH PROPOFOL;  Surgeon: Toledo, Benay Pike, MD;  Location: [facility] ENDOSCOPY;  Service: Gastroenterology;  Laterality: N/A;  . COLOSTOMY REVERSAL  2006  . ESOPHAGOGASTRODUODENOSCOPY (EGD) WITH PROPOFOL N/A 07/18/2016   Procedure: ESOPHAGOGASTRODUODENOSCOPY (EGD) WITH PROPOFOL;  Surgeon: Robert Bellow, MD;  Location: [facility] ENDOSCOPY;  Service: Endoscopy;  Laterality: N/A;  .  ESOPHAGOGASTRODUODENOSCOPY (EGD) WITH PROPOFOL N/A 08/03/2016   Procedure: ESOPHAGOGASTRODUODENOSCOPY (EGD) WITH PROPOFOL;  Surgeon: Robert Bellow, MD;  Location: [facility] ENDOSCOPY;  Service: Endoscopy;  Laterality: N/A;  . ESOPHAGOGASTRODUODENOSCOPY (EGD) WITH PROPOFOL N/A 09/01/2018   Procedure: ESOPHAGOGASTRODUODENOSCOPY (EGD) WITH PROPOFOL;  Surgeon: Toledo, Benay Pike, MD;  Location: [facility] ENDOSCOPY;  Service: Gastroenterology;  Laterality: N/A;  . ESOPHAGOGASTRODUODENOSCOPY (EGD) WITH PROPOFOL N/A 12/01/2018   Procedure: ESOPHAGOGASTRODUODENOSCOPY (EGD) WITH PROPOFOL;  Surgeon: Toledo, Benay Pike, MD;  Location: [facility] ENDOSCOPY;  Service: Gastroenterology;  Laterality: N/A;  . ESOPHAGOGASTRODUODENOSCOPY (EGD) WITH PROPOFOL N/A 02/23/2019   Procedure: ESOPHAGOGASTRODUODENOSCOPY (EGD) WITH PROPOFOL;  Surgeon: Lucilla Lame, MD;  Location: [place];  Service: Endoscopy;  Laterality: N/A;  . ESOPHAGOSCOPY WITH DILITATION  2012,2015   [facility] [place]  . EYE SURGERY    . GASTRECTOMY    . gastric ulcer  1989, 1991  . JOINT REPLACEMENT     right total hip arthroplasty 03/03/02  . LAPAROSCOPIC LYSIS OF ADHESIONS    . LUNG CANCER SURGERY  2011  . LYSIS OF ADHESION    . PLACEMENT OF BREAST IMPLANTS  1973  . thoracotomy with right upper lobectomy and central compartment node dissection    . UPPER GASTROINTESTINAL ENDOSCOPY  10-29-2008   [name]    History reviewed. No pertinent family history.  Social History:  reports that she quit smoking about 11 years ago. Her smoking use included cigarettes. She has a 25.00 pack-year smoking history. She has never used smokeless tobacco. She reports current alcohol use. She reports that she does not use drugs. She has a30pack-year smoking history (30 years x1ppd). She quit when she was diagnosed with lung cancer in 2012. She denies any known exposure to radiations or toxins. 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 She is planning to  move from [facility] to [place].  She closes on her current house on 06/21/2019 and her new house on 06/23/2019.  The patient is alone today.   Participants in the patient's visit and their role in the encounter included the patient, [employer], and [employer], CMA, today.  The intake visit was provided by Vito Berger, CMA.    Allergies:  Allergies  Allergen Reactions  . Hydromorphone Hcl Itching  . Bentyl [Dicyclomine] Nausea And Vomiting  . Biaxin [Clarithromycin] Other (See Comments)    hallucinations  . Erythromycin Nausea And Vomiting  . Reglan [Metoclopramide] Other (See Comments)    tremors    Current Medications: Current Outpatient Medications  Medication Sig Dispense Refill  . acetaminophen (TYLENOL) 500 MG tablet Take 500 mg by mouth every 6 (six) hours as needed.    . brimonidine-timolol (COMBIGAN) 0.2-0.5 % ophthalmic solution INT 1 GTT IN OU BID    . HYDROcodone-acetaminophen (NORCO/VICODIN) 5-325 MG per tablet Take 1 tablet by mouth every 6 (six) hours as needed.     . loratadine (CLARITIN) 10 MG tablet Take 10 mg by mouth daily.    . Multiple Vitamin (MULTI-VITAMINS) TABS Take 1 tablet by mouth daily. once daily.    . pantoprazole (PROTONIX) 40 MG tablet Take 40  mg by mouth 2 (two) times daily.     . sucralfate (CARAFATE) 1 G tablet Take 1 g by mouth 4 (four) times daily.     . traZODone (DESYREL) 150 MG tablet Take 300 mg by mouth at bedtime.     Marland Kitchen venlafaxine XR (EFFEXOR-XR) 150 MG 24 hr capsule Take 150 mg by mouth daily with breakfast.     . aspirin EC 81 MG tablet Take 81 mg by mouth daily.    . diphenoxylate-atropine (LOMOTIL) 2.5-0.025 MG tablet Take 1 tablet by mouth 4 (four) times daily as needed for diarrhea or loose stools. (Patient not taking: Reported on 06/05/2019) 60 tablet 5  . ferrous sulfate 325 (65 FE) MG tablet Take 325 mg by mouth every other day. Pt states every 4 days    . ondansetron (ZOFRAN) 4 MG tablet Take 1 tablet (4 mg total) by  mouth every 8 (eight) hours as needed for nausea or vomiting. (Patient not taking: Reported on 12/08/2018) 10 tablet 0   No current facility-administered medications for this visit.    Review of Systems  Constitutional: Negative.  Negative for chills, diaphoresis, fever, malaise/fatigue and weight loss (up 3 lbs).       Feels "pretty good".  HENT: Negative.  Negative for congestion, ear pain, hearing loss, nosebleeds, sinus pain and sore throat.   Eyes: Negative.  Negative for blurred vision, double vision and photophobia.  Respiratory: Positive for cough (in the mornings, related to allergies). Negative for hemoptysis, sputum production, shortness of breath and wheezing.   Cardiovascular: Negative.  Negative for chest pain, palpitations, claudication, leg swelling and PND.  Gastrointestinal: Positive for abdominal pain and diarrhea. Negative for blood in stool, constipation, heartburn, melena, nausea and vomiting.  Genitourinary: Negative.  Negative for dysuria, frequency, hematuria and urgency.  Musculoskeletal: Negative.  Negative for back pain, joint pain and myalgias.  Skin: Negative.  Negative for rash.  Neurological: Negative.  Negative for dizziness, tingling, sensory change, speech change, focal weakness, weakness and headaches.  Endo/Heme/Allergies: Positive for environmental allergies (seasonal; on Claritin). Does not bruise/bleed easily.  Psychiatric/Behavioral: Negative.  Negative for depression and memory loss. The patient is not nervous/anxious and does not have insomnia.   All other systems reviewed and are negative.  Performance status (ECOG):  1  Vitals There were no vitals taken for this visit.   Physical Exam  Constitutional: She is oriented to person, place, and time. She appears well-developed and well-nourished. No distress.  HENT:  Short light brown hair.  Mask.  Eyes: Conjunctivae and EOM are normal. No scleral icterus.  Glasses.  Blue eyes.  Neurological: She  is alert and oriented to person, place, and time.  Skin: She is not diaphoretic.  Psychiatric: She has a normal mood and affect. Her behavior is normal. Judgment and thought content normal.  Nursing note and vitals reviewed.   Appointment on 06/03/2019  Component Date Value Ref Range Status  . Creatinine, Ser 06/03/2019 0.67  0.44 - 1.00 mg/dL Final  . GFR calc non Af Amer 06/03/2019 >60  >60 mL/min Final  . GFR calc Af Amer 06/03/2019 >60  >60 mL/min Final   Performed at [facility], [address], [place], [place] [address]    Assessment:  Carol Shaffer is a 74 y.o. female withhyopgammaglobulinemiaand iron deficiency anemia.IgG levelshas beenin the low 300s.M-proteinis 0.She has not receivedIVIG.She has not had issues with recurrent infections.  Shehas collagenous colitisand is on budesonide. She has a history of  peptic ulcer disease and is s/pgastrojejunostomyin 1980s. She had asmall bowel obstruction and colonic perforation in the pasts/presection colostomy.Shehaschronicabdominalpain.  EGDon 09/01/2018 revealed esophageal plaques suspicious of candidiasis (KOH -). There was a stenosed Billroth I gastroduodenostomy, characterized by ulceration and an intact staple line. Lesionwasnot amenable to dilation.Jejunumpathology revealedenteric mucosa with pyloric metaplasiaand negative for active inflammation, anddysplasia. Stomach, pouchrevealedreactive gastropathy, negative for active inflammation,H pylori,intestinal metaplasia, dysplasia, and malignancy.EGD on 12/01/2018 revealed a normal esophagus. There was patent Billroth I gastroduodenostomy, characterized by erythema (reactive gastropathy). There was a 1 cm hiatal hernia. There was suspected jejunal inflammation characterized by erosions and friability. 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 EGD on 02/23/2019 revealed a normal esophagus.  There was evidence of a stenosed Billroth I  gastroduodenostomy.  Gastric pouch contained a bezoar. The gastroduodenal anastomosis was characterized by ulceration. The duodenum was normal.  Gastric biopsy revealed reactive gastropathy with reparative changes and focal scattered neuroendocrine aggregates. There was no H pylori or dysplasia.   She has a history of lung cancerin 2012 s/p right upper loberesection.Low dose chest CTon 09/04/2018 revealed multiplesmall pulmonary nodules are noted in the lungs bilaterally. In addition, therewasa 1.53 cmconcerning nodule in the right lower lobe. There was diffuse bronchial wall thickening with mild centrilobular and paraseptal emphysema; imaging findings suggestive of underlying COPD.   PET scanon 09/16/2018 revealed low level FDG uptake associated with the right lower lobe lung nodule(SUV 1.57). Thiswasa nonspecific finding. Given the size of this nodule and the low level uptake,findings may reflect inflammatory or infectious nodule. Malignancy wasnot entirely excluded.  Low dose chest CT on 12/04/2018 revealed a 15.3 mm RLL nodule unchanged in size.  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  There were some ground-glass attenuation components, but is predominantly solid in appearance with some internal air bronchograms, with macrolobulated and spiculated borders, clearly increased in size.   She has iron deficiency anemia.She received Ferahemex2(08/12/2017- 08/19/2017), 08/20/2018, 11/18/2018, and 02/18/2019.Oral ironhas been ineffective in the past  Ferritin has been followed: 9 on 05/07/2017, 14 on 07/15/2017, 66 on 11/07/2017, 12 on 02/12/2018, 7 on 08/13/2018, 9 on 11/12/2018, 14 on 02/17/2019, and 15 on 05/18/2019.  Patient has chronic hypogammaglobulinemia. SPEPrevealed no monoclonal protein. If issues with infections, consider  immunology referral.  Symptomatically, she denies any respiratory symptoms.   Plan: 1.   Review labs from 05/18/2019.  2.   History of lung cancer She iss/p RUL lobectomy 2012. Low dose chest CT on 09/04/2018 revealedmultiplesmall pulmonary nodules bilaterallyanda 1.53 cmright lower lobe nodule. PET scan on 09/16/2018 revealedlow level FDG uptake associated with the right lower lobe lung nodule(SUV 1.57).             Chest CT on 12/04/2018 revealed a stable 1.53 cm RLL nodule.               Chest CT on 06/03/2019 was personally reviewed.  Agree with radiology interpretation.   RLL nodule has increased in size and is currently 2 cm and c/w a slow growing neoplasm.  Discuss referral to Dr Genevive Bi for consideration of resection and pre-surgery PFTs.  If patient not felt to be a surgical candidate, anticipate biopsy and SBRT.  Present at tumor board on 06/11/2019. 3. Iron deficiency Hematocrit 38.8.  Hemoglobin 12.3.  MCV 90.7 on 05/18/2019.   Ferritin 15.  Patient receives IV iron if ferritin < 30.  Discuss plan for  f/u CBC and Feraheme later this month per patient's schedule.  Review interval EGD with planned follow-up. 4.  Consult thoracic surgery (Dr Genevive Bi) on 06/12/2019. 5.   Tumor board on 06/11/2019. 6.   MD to call patient after tumor board. 7.   RTC on 06/25/2019 for labs (CBC, ferritin) and +/- Feraheme on 06/26/2019.  I discussed the assessment and treatment plan with the patient.  The patient was provided an opportunity to ask questions and all were answered.  The patient agreed with the plan and demonstrated an understanding of the instructions.  The patient was advised to call back if the symptoms worsen or if the condition fails to improve as anticipated.  I provided 21 minutes (11:05 AM - 11:26 AM) of non-face-to-face time during this encounter.  I provided these services from the [facility] office.      Lequita Asal, MD, PhD    06/05/2019, 11:26 AM  I, Jacqualyn Posey, am acting as a Education administrator for [employer]. Mike Gip, MD.   I, Laverne Hursey C. Mike Gip, MD, have reviewed the above documentation for accuracy and completeness, and I agree with the above.

## 2019-06-05 ENCOUNTER — Inpatient Hospital Stay (HOSPITAL_BASED_OUTPATIENT_CLINIC_OR_DEPARTMENT_OTHER): Payer: Medicare Other | Admitting: Hematology and Oncology

## 2019-06-05 ENCOUNTER — Encounter: Payer: Self-pay | Admitting: Hematology and Oncology

## 2019-06-05 DIAGNOSIS — D509 Iron deficiency anemia, unspecified: Secondary | ICD-10-CM

## 2019-06-05 DIAGNOSIS — R911 Solitary pulmonary nodule: Secondary | ICD-10-CM

## 2019-06-05 DIAGNOSIS — C3411 Malignant neoplasm of upper lobe, right bronchus or lung: Secondary | ICD-10-CM

## 2019-06-05 NOTE — Progress Notes (Signed)
The patient c/o abdomen pain today ( 5). The patient Name and DOB has been verified by phone today.

## 2019-06-11 ENCOUNTER — Other Ambulatory Visit: Payer: Medicare Other

## 2019-06-11 NOTE — Progress Notes (Signed)
Tumor Board Documentation  Carol Shaffer was presented by Dr Mike Gip at our Tumor Board on 06/11/2019, which included representatives from medical oncology, radiation oncology, navigation, pathology, radiology, surgical, surgical oncology, internal medicine, pharmacy, pulmonology, palliative care, research.  Carol Shaffer currently presents as a current patient, for discussion with history of the following treatments: active survellience, surgical intervention(s).  Additionally, we reviewed previous medical and familial history, history of present illness, and recent lab results along with all available histopathologic and imaging studies. The tumor board considered available treatment options and made the following recommendations: Surgery(Refer to Dr Genevive Bi) Possible SBRT if not a surgical candidate  The following procedures/referrals were also placed: No orders of the defined types were placed in this encounter.   Clinical Trial Status: not discussed   Staging used:    AJCC Staging:       Group: History of Lung Cancer 2012   National site-specific guidelines   were discussed with respect to the case.  Tumor board is a meeting of clinicians from various specialty areas who evaluate and discuss patients for whom a multidisciplinary approach is being considered. Final determinations in the plan of care are those of the provider(s). The responsibility for follow up of recommendations given during tumor board is that of the provider.   Today's extended care, comprehensive team conference, Carol Shaffer was not present for the discussion and was not examined.   Multidisciplinary Tumor Board is a multidisciplinary case peer review process.  Decisions discussed in the Multidisciplinary Tumor Board reflect the opinions of the specialists present at the conference without having examined the patient.  Ultimately, treatment and diagnostic decisions rest with the primary provider(s) and the patient.

## 2019-06-12 ENCOUNTER — Ambulatory Visit (INDEPENDENT_AMBULATORY_CARE_PROVIDER_SITE_OTHER): Payer: Medicare Other | Admitting: Cardiothoracic Surgery

## 2019-06-12 ENCOUNTER — Encounter: Payer: Self-pay | Admitting: Cardiothoracic Surgery

## 2019-06-12 ENCOUNTER — Telehealth: Payer: Self-pay

## 2019-06-12 ENCOUNTER — Other Ambulatory Visit: Payer: Self-pay

## 2019-06-12 VITALS — BP 137/77 | HR 81 | Temp 97.7°F | Resp 12 | Ht 59.0 in | Wt 87.6 lb

## 2019-06-12 DIAGNOSIS — Z85118 Personal history of other malignant neoplasm of bronchus and lung: Secondary | ICD-10-CM

## 2019-06-12 DIAGNOSIS — R918 Other nonspecific abnormal finding of lung field: Secondary | ICD-10-CM

## 2019-06-12 NOTE — Patient Instructions (Addendum)
We will get you set up for pulmonary function studies.  We will also request a CT guided biopsy of the mass found in your right lung.  We will call you with these dates and instructions.   We will have you follow up here after these are completed.    Lung Biopsy  A lung biopsy is a procedure to remove a tissue sample from the lung. The tissue can be examined under a microscope to help diagnose various lung disorders. There are three types of lung biopsies:  Needle biopsy. In this type, the sample is removed with a needle.  Bronchoscopy. In this type, the sample is removed through a flexible tube inserted into the lungs (bronchoscope).  Open biopsy. In this type, the sample is removed through an incision made in the chest. Tell a health care provider about:  Any allergies you have.  All medicines you are taking, including vitamins, herbs, eye drops, creams, and over-the-counter medicines.  Any problems you or family members have had with anesthetic medicines.  Any blood disorders or bleeding problems that you have.  Any surgeries you have had.  Any medical conditions you have.  Whether you are pregnant or may be pregnant. What are the risks? Generally, this is a safe procedure. However, problems may occur, including:  Collapsed lung.  Bleeding.  Infection.  Pain. What happens before the procedure? Staying hydrated Follow instructions from your health care provider about hydration, which may include:  Up to 2 hours before the procedure - you may continue to drink clear liquids, such as water, clear fruit juice, black coffee, and plain tea. Eating and drinking restrictions Follow instructions from your health care provider about eating and drinking, which may include:  8 hours before the procedure - stop eating heavy meals or foods such as meat, fried foods, or fatty foods.  6 hours before the procedure - stop eating light meals or foods, such as toast or  cereal.  6 hours before the procedure - stop drinking milk or drinks that contain milk.  2 hours before the procedure - stop drinking clear liquids. General instructions  Ask your health care provider about: ? Changing or stopping your regular medicines. This is especially important if you take diabetes medicines or blood thinners. ? Taking medicines such as aspirin and ibuprofen. These medicines can thin your blood. Do not take these medicines before your procedure if your health care provider instructs you not to.  Plan to have someone take you home from the hospital or clinic. What happens during the procedure?  To lower your risk of infection: ? Your health care team will wash or sanitize their hands. ? If you are having an open biopsy, your skin will be washed with soap.  An IV tube may be inserted into one of your veins.  You may be given one or both of the following: ? A medicine to help you relax (sedative) during the procedure. ? A medicine to numb the area where the biopsy sample will be taken (local anesthetic). ? A medicine to make you sleep through the procedure (general anesthetic).  If you have a needle biopsy: ? A biopsy needle will be inserted into your lung. A CT scanner may be used to guide the needle to the right place. ? The needle will be used to collect the tissue sample.  If you have bronchoscopy: ? A bronchoscope will be inserted into your lungs through your mouth or nose. ? A needle or forceps  will be passed through the bronchoscope to remove the tissue sample.  If you have an open biopsy: ? An incision will be made in your chest. ? The tissue sample will be removed using surgical tools. ? The incision will be closed with skin glue, skin adhesive strips, or stitches (sutures). The procedure may vary among health care providers and hospitals. What happens after the procedure?  Your blood pressure, heart rate, breathing rate, and blood oxygen level will  be monitored until the medicines you were given have worn off.  If a needle biopsy was performed, a bandage (dressing) will be applied over the area where the needle was inserted. You may be asked to apply pressure to the bandage for several minutes to ensure there is minimal bleeding.  If a bronchoscope was used, you may have a cough and some soreness in your throat.  Do not drive for 24 hours if you were given a sedative. Summary  A lung biopsy is a procedure to remove a tissue sample from your lung. The sample is examined to help diagnose various lung disorders.  A lung biopsy may be done using a needle, by inserting a tube into the lungs, or through an incision in the chest.  After your lung biopsy, you may have a cough and some throat soreness. This information is not intended to replace advice given to you by your health care provider. Make sure you discuss any questions you have with your health care provider. Document Revised: 12/28/2016 Document Reviewed: 12/07/2015 Elsevier Patient Education  2020 [employer].

## 2019-06-12 NOTE — Telephone Encounter (Signed)
Spoke with the patient and she is aware of all dates, times, and instructions. Follow up with Dr Genevive Bi for next Friday unless her biopsy results are not back yet.

## 2019-06-12 NOTE — Telephone Encounter (Signed)
Message left for the patient to call back to review.  The patient is scheduled for a Covid test at [facility] on 06/15/19 anytime between 8 am and 11am.  The patient is scheduled for a Pulmonary Function test at [facility] on 06/16/19. She will need to arrive at the [facility] at 9:45 am. She may not have any caffeine or inhalers that morning before the test.   The patient is scheduled for CT guided Lung Biopsy at [facility] on 06/17/19. She is to arrive at the [facility] at 9:30 am that day. They will call her to go over specific instructions for this.   She will need to schedule a follow up appointment with Dr Genevive Bi for after these are completed.

## 2019-06-12 NOTE — Progress Notes (Signed)
Patient ID: Carol Shaffer, female   DOB: [birth year], 74 y.o.   MRN: [id number]  Chief Complaint  Patient presents with  . New Patient (Initial Visit)    Lung Mass    Referred By Dr. Mike Gip Reason for Referral right lower lobe mass  HPI Location, Quality, Duration, Severity, Timing, Context, Modifying Factors, Associated Signs and Symptoms.  Carol Shaffer is a 74 y.o. female.  She has a past medical history of car neck obstructive pulmonary disease and had previously undergone a right upper lobectomy in 2012.  She has been followed ever since and recently had a chest CT showing a right lower lobe nodule.  Over the course of the last several scans this has slowly increased in size and a PET scan from last year showed no increase in uptake.  The most recent CT scan however did show what appeared to be an increase in the size of the right lower lobe nodule.  She is sent here today for consideration of diagnosis and treatment.  She has stopped smoking since her first thoracotomy.  She states that she does get short of breath with walking up and down steps.  She denied any hemoptysis or weight loss.  She has not had a diagnosis made yet nor has she had formal set of pulmonary function test done.  She is receiving iron therapy because of a duodenal ulcer.  She apparently has undergone a gastric resection in the past for ulcer disease and is also had a colon resection for collagenous colitis.   Past Medical History:  Diagnosis Date  . Arthritis    hands  . Avascular necrosis of hip, right ([place])   . Blepharospasm   . Cancer [facility]) 2011   Right Upper Lobe Lobectomy  . Chronic diarrhea   . Chronic diarrhea   . Collagenous colitis   . COPD (chronic obstructive pulmonary disease) ([place])   . Depression   . Diverticulosis   . Gastric outlet obstruction   . Headache    every couple of days  . Hemorrhoid   . History of Crohn's disease   . Hypoglycemic disorder   . IDA (iron deficiency anemia)   .  Intestinal adhesions   . Multiple gastric ulcers   . Multiple gastric ulcers   . 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[place])   . Stroke [facility]) 8 yrs ago   double vision left eye    Past Surgical History:  Procedure Laterality Date  . APPENDECTOMY  1989  . botox injections for blepharospasm    . BREAST SURGERY    . CATARACT EXTRACTION    . COLON RESECTION  2005  . COLONOSCOPY  10-29-2008   [name]  . COLONOSCOPY WITH PROPOFOL N/A 03/05/2017   Procedure: COLONOSCOPY WITH PROPOFOL;  Surgeon: Toledo, Benay Pike, MD;  Location: [facility] ENDOSCOPY;  Service: Gastroenterology;  Laterality: N/A;  . COLOSTOMY REVERSAL  2006  . ESOPHAGOGASTRODUODENOSCOPY (EGD) WITH PROPOFOL N/A 07/18/2016   Procedure: ESOPHAGOGASTRODUODENOSCOPY (EGD) WITH PROPOFOL;  Surgeon: Robert Bellow, MD;  Location: [facility] ENDOSCOPY;  Service: Endoscopy;  Laterality: N/A;  . ESOPHAGOGASTRODUODENOSCOPY (EGD) WITH PROPOFOL N/A 08/03/2016   Procedure: ESOPHAGOGASTRODUODENOSCOPY (EGD) WITH PROPOFOL;  Surgeon: Robert Bellow, MD;  Location: [facility] ENDOSCOPY;  Service: Endoscopy;  Laterality: N/A;  . ESOPHAGOGASTRODUODENOSCOPY (EGD) WITH PROPOFOL N/A 09/01/2018   Procedure: ESOPHAGOGASTRODUODENOSCOPY (EGD) WITH PROPOFOL;  Surgeon: Toledo, Benay Pike, MD;  Location: [facility] ENDOSCOPY;  Service: Gastroenterology;  Laterality: N/A;  . ESOPHAGOGASTRODUODENOSCOPY (EGD) WITH PROPOFOL N/A 12/01/2018  Procedure: ESOPHAGOGASTRODUODENOSCOPY (EGD) WITH PROPOFOL;  Surgeon: Toledo, Benay Pike, MD;  Location: [facility] ENDOSCOPY;  Service: Gastroenterology;  Laterality: N/A;  . ESOPHAGOGASTRODUODENOSCOPY (EGD) WITH PROPOFOL N/A 02/23/2019   Procedure: ESOPHAGOGASTRODUODENOSCOPY (EGD) WITH PROPOFOL;  Surgeon: Lucilla Lame, MD;  Location: [place];  Service: Endoscopy;  Laterality: N/A;  . ESOPHAGOSCOPY WITH DILITATION  2012,2015   [facility] [place]  . EYE SURGERY    . GASTRECTOMY    . gastric ulcer  1989, 1991  . JOINT REPLACEMENT     right total  hip arthroplasty 03/03/02  . LAPAROSCOPIC LYSIS OF ADHESIONS    . LUNG CANCER SURGERY  2011  . LYSIS OF ADHESION    . PLACEMENT OF BREAST IMPLANTS  1973  . thoracotomy with right upper lobectomy and central compartment node dissection    . UPPER GASTROINTESTINAL ENDOSCOPY  10-29-2008   [name]    No family history on file.  Social History Social History   Tobacco Use  . Smoking status: Former Smoker    Packs/day: 1.00    Years: 25.00    Pack years: 25.00    Types: Cigarettes    Quit date: 01/30/2008    Years since quitting: 11.3  . Smokeless tobacco: Never Used  Substance Use Topics  . Alcohol use: Yes    Comment: wine once per month  . Drug use: No    Allergies  Allergen Reactions  . Hydromorphone Hcl Itching  . Bentyl [Dicyclomine] Nausea And Vomiting  . Biaxin [Clarithromycin] Other (See Comments)    hallucinations  . Erythromycin Nausea And Vomiting  . Reglan [Metoclopramide] Other (See Comments)    tremors    Current Outpatient Medications  Medication Sig Dispense Refill  . acetaminophen (TYLENOL) 500 MG tablet Take 500 mg by mouth every 6 (six) hours as needed.    . brimonidine-timolol (COMBIGAN) 0.2-0.5 % ophthalmic solution INT 1 GTT IN OU BID    . HYDROcodone-acetaminophen (NORCO/VICODIN) 5-325 MG per tablet Take 1 tablet by mouth every 6 (six) hours as needed.     . loratadine (CLARITIN) 10 MG tablet Take 10 mg by mouth daily.    . Multiple Vitamin (MULTI-VITAMINS) TABS Take 1 tablet by mouth daily. once daily.    . ondansetron (ZOFRAN) 4 MG tablet Take 1 tablet (4 mg total) by mouth every 8 (eight) hours as needed for nausea or vomiting. 10 tablet 0  . pantoprazole (PROTONIX) 40 MG tablet Take 40 mg by mouth 2 (two) times daily.     . sucralfate (CARAFATE) 1 G tablet Take 1 g by mouth 4 (four) times daily.     . traZODone (DESYREL) 150 MG tablet Take 300 mg by mouth at bedtime.     Marland Kitchen venlafaxine XR (EFFEXOR-XR) 150 MG 24 hr capsule Take 150 mg by mouth  daily with breakfast.      No current facility-administered medications for this visit.      Review of Systems A complete review of systems was asked and was negative except for the following positive findings gum disease, shortness of breath, ulcer disease, abdominal pain, arthritis, blurry vision with glasses, glaucoma.  Blood pressure 137/77, pulse 81, temperature 97.7 F (36.5 C), resp. rate 12, height 4' 11"  (1.499 m), weight 87 lb 9.6 oz (39.7 kg), SpO2 97 %.  Physical Exam CONSTITUTIONAL:  Pleasant, well-developed, well-nourished, and in no acute distress. EYES: Pupils equal and reactive to light, Sclera non-icteric EARS, NOSE, MOUTH AND THROAT:  The oropharynx was clear.  Dentition is good  repair.  Oral mucosa pink and moist. LYMPH NODES:  Lymph nodes in the neck and axillae were normal RESPIRATORY:  Lungs were clear.  Normal respiratory effort without pathologic use of accessory muscles of respiration CARDIOVASCULAR: Heart was regular without murmurs.  There were no carotid bruits. GI: The abdomen was soft, nontender, and nondistended. There were no palpable masses. There was no hepatosplenomegaly. There were normal bowel sounds in all quadrants. GU:  Rectal deferred.   MUSCULOSKELETAL:  Normal muscle strength and tone.  No clubbing or cyanosis.   SKIN:  There were no pathologic skin lesions.  There were no nodules on palpation. NEUROLOGIC:  Sensation is normal.  Cranial nerves are grossly intact. PSYCH:  Oriented to person, place and time.  Mood and affect are normal.  Data Reviewed CT scans  I have personally reviewed the patient's imaging, laboratory findings and medical records.    Assessment    There is an enlarging right lower lobe nodule that in the setting of her prior right thoracotomy for lung cancer is certainly concerning    Plan    I reviewed with her the options of completion pneumonectomy versus other forms of therapy for a presumed lung cancer.  Because  a diagnosis has not yet been established I would like to pursue a CT-guided needle biopsy as well as some formal pulmonary function test.  Once these are complete I will see her back in the office to review.       Nestor Lewandowsky, MD 06/12/2019, 9:46 AM

## 2019-06-15 ENCOUNTER — Other Ambulatory Visit: Payer: Self-pay

## 2019-06-15 ENCOUNTER — Other Ambulatory Visit: Payer: Self-pay | Admitting: Physician Assistant

## 2019-06-15 ENCOUNTER — Other Ambulatory Visit
Admission: RE | Admit: 2019-06-15 | Discharge: 2019-06-15 | Disposition: A | Discharge status: Home / Self Care | Payer: Medicare Other | Source: Ambulatory Visit | Attending: Hematology and Oncology | Admitting: Hematology and Oncology

## 2019-06-15 DIAGNOSIS — Z01812 Encounter for preprocedural laboratory examination: Secondary | ICD-10-CM | POA: Insufficient documentation

## 2019-06-15 DIAGNOSIS — Z20822 Contact with and (suspected) exposure to covid-19: Secondary | ICD-10-CM | POA: Diagnosis not present

## 2019-06-15 LAB — SARS CORONAVIRUS 2 (TAT 6-24 HRS): SARS Coronavirus 2: NEGATIVE

## 2019-06-16 ENCOUNTER — Ambulatory Visit: Payer: Medicare Other | Attending: Cardiothoracic Surgery

## 2019-06-16 ENCOUNTER — Other Ambulatory Visit: Payer: Self-pay | Admitting: Radiology

## 2019-06-16 DIAGNOSIS — R918 Other nonspecific abnormal finding of lung field: Secondary | ICD-10-CM | POA: Diagnosis not present

## 2019-06-16 DIAGNOSIS — Z85118 Personal history of other malignant neoplasm of bronchus and lung: Secondary | ICD-10-CM | POA: Diagnosis not present

## 2019-06-16 LAB — BLOOD GAS, ARTERIAL
Acid-base deficit: 0.8 mmol/L (ref 0.0–2.0)
Bicarbonate: 24.9 mmol/L (ref 20.0–28.0)
FIO2: 0.21
O2 Saturation: 95.1 %
Patient temperature: 37
pCO2 arterial: 44 mmHg (ref 32.0–48.0)
pH, Arterial: 7.36 (ref 7.350–7.450)
pO2, Arterial: 79 mmHg — ABNORMAL LOW (ref 83.0–108.0)

## 2019-06-16 MED ORDER — ALBUTEROL SULFATE (2.5 MG/3ML) 0.083% IN NEBU
2.5000 mg | INHALATION_SOLUTION | Freq: Once | RESPIRATORY_TRACT | Status: AC
  Administered 2019-06-16: 2.5 mg via RESPIRATORY_TRACT
  Filled 2019-06-16: qty 3

## 2019-06-16 NOTE — Progress Notes (Signed)
Patient on schedule for Lung Biopsy 06/17/2019, made aware to be here @ 0930, NPO after MN, and have driver for discharge after procedure. Stated understanding.

## 2019-06-17 ENCOUNTER — Other Ambulatory Visit: Payer: Self-pay

## 2019-06-17 ENCOUNTER — Ambulatory Visit
Admission: RE | Admit: 2019-06-17 | Discharge: 2019-06-17 | Disposition: A | Discharge status: Home / Self Care | Payer: Medicare Other | Source: Ambulatory Visit | Attending: Interventional Radiology | Admitting: Interventional Radiology

## 2019-06-17 ENCOUNTER — Ambulatory Visit
Admission: RE | Admit: 2019-06-17 | Discharge: 2019-06-17 | Disposition: A | Discharge status: Home / Self Care | Payer: Medicare Other | Source: Ambulatory Visit | Attending: Cardiothoracic Surgery | Admitting: Cardiothoracic Surgery

## 2019-06-17 DIAGNOSIS — R918 Other nonspecific abnormal finding of lung field: Secondary | ICD-10-CM

## 2019-06-17 DIAGNOSIS — R911 Solitary pulmonary nodule: Secondary | ICD-10-CM | POA: Insufficient documentation

## 2019-06-17 DIAGNOSIS — Z85118 Personal history of other malignant neoplasm of bronchus and lung: Secondary | ICD-10-CM | POA: Diagnosis not present

## 2019-06-17 DIAGNOSIS — Z9882 Breast implant status: Secondary | ICD-10-CM | POA: Insufficient documentation

## 2019-06-17 LAB — CBC
HCT: 42.7 % (ref 36.0–46.0)
Hemoglobin: 13.7 g/dL (ref 12.0–15.0)
MCH: 29.2 pg (ref 26.0–34.0)
MCHC: 32.1 g/dL (ref 30.0–36.0)
MCV: 91 fL (ref 80.0–100.0)
Platelets: 357 10*3/uL (ref 150–400)
RBC: 4.69 MIL/uL (ref 3.87–5.11)
RDW: 14.8 % (ref 11.5–15.5)
WBC: 5.7 10*3/uL (ref 4.0–10.5)
nRBC: 0 % (ref 0.0–0.2)

## 2019-06-17 LAB — PROTIME-INR: Prothrombin Time: <10 seconds — ABNORMAL LOW (ref 11.4–15.2)

## 2019-06-17 MED ORDER — FENTANYL CITRATE (PF) 100 MCG/2ML IJ SOLN
INTRAMUSCULAR | Status: AC | PRN
  Administered 2019-06-17: 25 ug via INTRAVENOUS

## 2019-06-17 MED ORDER — HEPARIN SOD (PORK) LOCK FLUSH 100 UNIT/ML IV SOLN
INTRAVENOUS | Status: AC
  Filled 2019-06-17: qty 5

## 2019-06-17 MED ORDER — MIDAZOLAM HCL 5 MG/5ML IJ SOLN
INTRAMUSCULAR | Status: AC
  Filled 2019-06-17: qty 5

## 2019-06-17 MED ORDER — MIDAZOLAM HCL 5 MG/5ML IJ SOLN
INTRAMUSCULAR | Status: AC | PRN
  Administered 2019-06-17: 0.5 mg via INTRAVENOUS

## 2019-06-17 MED ORDER — HYDROCODONE-ACETAMINOPHEN 5-325 MG PO TABS
ORAL_TABLET | ORAL | Status: AC
  Filled 2019-06-17: qty 1

## 2019-06-17 MED ORDER — HYDROCODONE-ACETAMINOPHEN 5-325 MG PO TABS
1.0000 | ORAL_TABLET | Freq: Once | ORAL | Status: AC
  Administered 2019-06-17: 1 via ORAL
  Filled 2019-06-17: qty 1

## 2019-06-17 MED ORDER — SODIUM CHLORIDE 0.9 % IV SOLN: INTRAVENOUS | Status: DC

## 2019-06-17 MED ORDER — FENTANYL CITRATE (PF) 100 MCG/2ML IJ SOLN
INTRAMUSCULAR | Status: AC
  Filled 2019-06-17: qty 2

## 2019-06-17 NOTE — Sedation Documentation (Signed)
Total conscious sedation: Versed 1 mg IV, Fentanyl 25 mcg IV. Pt. Tolerated procedure well.

## 2019-06-17 NOTE — Procedures (Signed)
Interventional Radiology Procedure Note  Procedure: CT BX RLL NODULE  Complications: None  Estimated Blood Loss: MIN  Findings: 18 G CORE

## 2019-06-17 NOTE — Consult Note (Signed)
Chief Complaint:   RLL nodule  Referring Physician(s): Oaks,Timothy    History of Present Illness: Carol Shaffer is a 74 y.o. female with previous RULobectomy for prior lung ca.  Now with RLL indeterminate nodular mass by CT and PET.  Plan for CT bx today.  No complaints.  Past Medical History:  Diagnosis Date  . Arthritis    hands  . Avascular necrosis of hip, right ([place])   . Blepharospasm   . Cancer [facility]) 2011   Right Upper Lobe Lobectomy  . Chronic diarrhea   . Chronic diarrhea   . Collagenous colitis   . COPD (chronic obstructive pulmonary disease) ([place])   . Depression   . Diverticulosis   . Gastric outlet obstruction   . Headache    every couple of days  . Hemorrhoid   . History of Crohn's disease   . Hypoglycemic disorder   . IDA (iron deficiency anemia)   . Intestinal adhesions   . Multiple gastric ulcers   . Multiple gastric ulcers   . 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[place])   . Stroke [facility]) 8 yrs ago   double vision left eye    Past Surgical History:  Procedure Laterality Date  . APPENDECTOMY  1989  . botox injections for blepharospasm    . BREAST SURGERY    . CATARACT EXTRACTION    . COLON RESECTION  2005  . COLONOSCOPY  10-29-2008   [name]  . COLONOSCOPY WITH PROPOFOL N/A 03/05/2017   Procedure: COLONOSCOPY WITH PROPOFOL;  Surgeon: Toledo, Benay Pike, MD;  Location: [facility] ENDOSCOPY;  Service: Gastroenterology;  Laterality: N/A;  . COLOSTOMY REVERSAL  2006  . ESOPHAGOGASTRODUODENOSCOPY (EGD) WITH PROPOFOL N/A 07/18/2016   Procedure: ESOPHAGOGASTRODUODENOSCOPY (EGD) WITH PROPOFOL;  Surgeon: Robert Bellow, MD;  Location: [facility] END[facility];  Service: Endoscopy;  Laterality: N/A;  . ESOPHAGOGASTRODUODENOSCOPY (EGD) WITH PROPOFOL N/A 08/03/2016   Procedure: ESOPHAGOGASTRODUODENOSCOPY (EGD) WITH PROPOFOL;  Surgeon: Robert Bellow, MD;  Location: [facility];  Service: Endoscopy;  Laterality: N/A;  . ESOPHAGOGASTRODUODENOSCOPY (EGD) WITH  PROPOFOL N/A 09/01/2018   Procedure: ESOPHAGOGASTRODUODENOSCOPY (EGD) WITH PROPOFOL;  Surgeon: Toledo, Benay Pike, MD;  Location: [facility];  Service: Gastroenterology;  Laterality: N/A;  . ESOPHAGOGASTRODUODENOSCOPY (EGD) WITH PROPOFOL N/A 12/01/2018   Procedure: ESOPHAGOGASTRODUODENOSCOPY (EGD) WITH PROPOFOL;  Surgeon: Toledo, Benay Pike, MD;  Location: [facility];  Service: Gastroenterology;  Laterality: N/A;  . ESOPHAGOGASTRODUODENOSCOPY (EGD) WITH PROPOFOL N/A 02/23/2019   Procedure: ESOPHAGOGASTRODUODENOSCOPY (EGD) WITH PROPOFOL;  Surgeon: Lucilla Lame, MD;  Location: [place];  Service: Endoscopy;  Laterality: N/A;  . ESOPHAGOSCOPY WITH DILITATION  2012,2015   [facility] [place]  . EYE SURGERY    . GASTRECTOMY    . gastric ulcer  1989, 1991  . JOINT REPLACEMENT     right total hip arthroplasty 03/03/02  . LAPAROSCOPIC LYSIS OF ADHESIONS    . LUNG CANCER SURGERY  2011  . LYSIS OF ADHESION    . PLACEMENT OF BREAST IMPLANTS  1973  . thoracotomy with right upper lobectomy and central compartment node dissection    . UPPER GASTROINTESTINAL ENDOSCOPY  10-29-2008   [name]    Allergies: Hydromorphone hcl, Bentyl [dicyclomine], Biaxin [clarithromycin], Erythromycin, and Reglan [metoclopramide]  Medications: Prior to Admission medications   Medication Sig Start Date End Date Taking? Authorizing Provider  acetaminophen (TYLENOL) 500 MG tablet Take 500 mg by mouth every 6 (six) hours as needed.   Yes [provider]  brimonidine-timolol (COMBIGAN) 0.2-0.5 % ophthalmic solution INT  1 GTT IN OU BID 10/29/17  Yes [provider]  HYDROcodone-acetaminophen (NORCO/VICODIN) 5-325 MG per tablet Take 1 tablet by mouth every 6 (six) hours as needed.  08/17/13  Yes [provider]  loratadine (CLARITIN) 10 MG tablet Take 10 mg by mouth daily.   Yes [provider]  Multiple Vitamin (MULTI-VITAMINS) TABS Take 1 tablet by mouth daily. once daily.   Yes  [provider]  pantoprazole (PROTONIX) 40 MG tablet Take 40 mg by mouth 2 (two) times daily.  10/28/13  Yes [provider]  sucralfate (CARAFATE) 1 G tablet Take 1 g by mouth 4 (four) times daily.  10/28/13  Yes [provider]  traZODone (DESYREL) 150 MG tablet Take 300 mg by mouth at bedtime.  10/28/13  Yes [provider]  venlafaxine XR (EFFEXOR-XR) 150 MG 24 hr capsule Take 150 mg by mouth daily with breakfast.  10/28/13  Yes [provider]  ondansetron (ZOFRAN) 4 MG tablet Take 1 tablet (4 mg total) by mouth every 8 (eight) hours as needed for nausea or vomiting. Patient not taking: Reported on 06/17/2019 07/31/16   Robert Bellow, MD     History reviewed. No pertinent family history.  Social History   Socioeconomic History  . Marital status: Widowed    Spouse name: Not on file  . Number of children: 2  . Years of education: Not on file  . Highest education level: Not on file  Occupational History  . Occupation: retired    Comment: Pharmacist, hospital  Tobacco Use  . Smoking status: Former Smoker    Packs/day: 1.00    Years: 25.00    Pack years: 25.00    Types: Cigarettes    Quit date: 01/30/2008    Years since quitting: 11.3  . Smokeless tobacco: Never Used  Substance and Sexual Activity  . Alcohol use: Yes    Comment: wine once per month  . Drug use: No  . Sexual activity: Not on file  Other Topics Concern  . Not on file  Social History Narrative   Lives alone    Social Determinants of Health   Financial Resource Strain:   . Difficulty of Paying Living Expenses:   Food Insecurity:   . Worried About Charity fundraiser in the Last Year:   . Arboriculturist in the Last Year:   Transportation Needs:   . Film/video editor (Medical):   Marland Kitchen Lack of Transportation (Non-Medical):   Physical Activity:   . Days of Exercise per Week:   . Minutes of Exercise per Session:   Stress:   . Feeling of Stress :   Social Connections:   .  Frequency of Communication with Friends and Family:   . Frequency of Social Gatherings with Friends and Family:   . Attends Religious Services:   . Active Member of Clubs or Organizations:   . Attends Archivist Meetings:   Marland Kitchen Marital Status:     ECOG Status: 1 - Symptomatic but completely ambulatory  Review of Systems: A 12 point ROS discussed and pertinent positives are indicated in the HPI above.  All other systems are negative.  Review of Systems  Vital Signs: BP (!) 139/55   Pulse 78   Temp 98.2 F (36.8 C) (Oral)   Resp 20   Ht 4' 11"  (1.499 m)   Wt 39.9 kg   SpO2 98%   BMI 17.77 kg/m   Physical Exam Constitutional:  General: She is not in acute distress.    Appearance: She is not diaphoretic.  Eyes:     General: No scleral icterus.    Conjunctiva/sclera: Conjunctivae normal.  Cardiovascular:     Rate and Rhythm: Normal rate and regular rhythm.  Pulmonary:     Effort: Pulmonary effort is normal.     Breath sounds: Normal breath sounds.  Abdominal:     General: Abdomen is flat. Bowel sounds are normal.  Skin:    General: Skin is warm and dry.     Coloration: Skin is not jaundiced.  Neurological:     General: No focal deficit present.     Mental Status: Mental status is at baseline.  Psychiatric:        Mood and Affect: Mood normal.        Thought Content: Thought content normal.     Imaging: CT CHEST WO CONTRAST  Result Date: 06/03/2019 CLINICAL DATA:  74 year old female with history of lung cancer status post right upper lobe resection. Follow-up study. EXAM: CT CHEST WITHOUT CONTRAST TECHNIQUE: Multidetector CT imaging of the chest was performed following the standard protocol without IV contrast. COMPARISON:  Low-dose lung cancer screening chest CT 12/04/2018. FINDINGS: Cardiovascular: Heart size is normal. There is no significant pericardial fluid, thickening or pericardial calcification. There is aortic atherosclerosis, as well as  atherosclerosis of the great vessels of the mediastinum and the coronary arteries, including calcified atherosclerotic plaque in the left circumflex and right coronary arteries. Mediastinum/Nodes: No pathologically enlarged mediastinal or hilar lymph nodes. Please note that accurate exclusion of hilar adenopathy is limited on noncontrast CT scans. Densely calcified right hilar lymph nodes are incidentally noted. Esophagus is unremarkable in appearance. No axillary lymphadenopathy Lungs/Pleura: Status post right upper lobectomy. Compensatory hyperexpansion of the right middle and lower lobes. Nodule of concern in the right lower lobe (axial image 66 of series 3 and sagittal image 33 of series 6) there is a 2.0 x 1.7 x 2.0 cm nodule which has some ground-glass attenuation components, but is predominantly solid in appearance with some internal air bronchograms, with macrolobulated and spiculated borders, clearly increased in size compared to the prior examination. No acute consolidative airspace disease. No pleural effusions. Mild diffuse bronchial wall thickening with moderate centrilobular and paraseptal emphysema. Upper Abdomen: Multiple low-attenuation lesions are noted in the kidneys bilaterally, incompletely characterized on today's non-contrast CT examination, but statistically likely to represent cysts. Musculoskeletal: Bilateral breast implants with dense capsular calcifications incidentally noted. There are no aggressive appearing lytic or blastic lesions noted in the visualized portions of the skeleton. IMPRESSION: 1. 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 This is compatible with a low level metabolic activity noted on the prior PET-CT. Consultation with Pulmonology or Thoracic Surgery recommended. 2. Aortic atherosclerosis, in addition to 2 vessel coronary artery  disease. Assessment for potential risk factor modification, dietary therapy or pharmacologic therapy may be warranted, if clinically indicated. 3. Additional incidental findings, as above. These results will be called to the ordering clinician or representative by the Radiologist Assistant, and communication documented in the PACS or [employer]. Aortic Atherosclerosis (ICD10-I70.0). Electronically Signed   By: Vinnie Langton M.D.   On: 06/03/2019 16:23   Pulmonary Function Test [facility] Only  Result Date: 06/16/2019 Spirometry Data Is Acceptable and Reproducible Moderate Obstructive Airways Disease without Significant Broncho-Dilator Response +airtrapping Consider outpatient Pulmonary Consultation if needed Clinical  Correlation Advised    Labs:  CBC: Recent Labs    11/12/18 0904 02/17/19 0850 05/18/19 1437 06/17/19 0944  WBC 6.3 6.9 5.6 5.7  HGB 11.9* 11.7* 12.3 13.7  HCT 37.9 37.5 38.8 42.7  PLT 385 409* 314 357    COAGS: No results for input(s): INR, APTT in the last 8760 hours.  BMP: Recent Labs    06/03/19 1042  CREATININE 0.67  GFRNONAA >60  GFRAA >60    LIVER FUNCTION TESTS: No results for input(s): BILITOT, AST, ALT, ALKPHOS, PROT, ALBUMIN in the last 8760 hours.  TUMOR MARKERS: No results for input(s): AFPTM, CEA, CA199, CHROMGRNA in the last 8760 hours.  Assessment and Plan:  Suspicious RLL nodule.  Plan for CT bx.  Consent obtained from pt and her son.  Risks and benefits of CT guided lung nodule biopsy was discussed with the patient including, but not limited to bleeding, hemoptysis, respiratory failure requiring intubation, infection, pneumothorax requiring chest tube placement, stroke from air embolism or even death.  All of the patient's questions were answered and the patient is agreeable to proceed.  Consent signed and in chart.    Thank you for this interesting consult.  I greatly enjoyed meeting Carol Shaffer and look forward to participating  in their care.  A copy of this report was sent to the requesting provider on this date.  Electronically Signed: Greggory Keen, MD 06/17/2019, 10:31 AM   I spent a total of  30 Minutes   in face to face in clinical consultation, greater than 50% of which was counseling/coordinating care for this pt with a RLL nodule.

## 2019-06-19 ENCOUNTER — Encounter: Payer: Self-pay | Admitting: Cardiothoracic Surgery

## 2019-06-19 ENCOUNTER — Ambulatory Visit (INDEPENDENT_AMBULATORY_CARE_PROVIDER_SITE_OTHER): Payer: Medicare Other | Admitting: Cardiothoracic Surgery

## 2019-06-19 ENCOUNTER — Other Ambulatory Visit: Payer: Self-pay

## 2019-06-19 VITALS — BP 110/71 | HR 81 | Temp 94.6°F | Ht 59.0 in | Wt 85.8 lb

## 2019-06-19 DIAGNOSIS — R918 Other nonspecific abnormal finding of lung field: Secondary | ICD-10-CM | POA: Diagnosis not present

## 2019-06-19 NOTE — Progress Notes (Signed)
[facility] Follow Up Note  Patient ID: Carol Shaffer, female   DOB: January 12, 1946, 74 y.o.   MRN: [id number]  HISTORY: Comes in today for results of her RLL lung biopsy.  Tolerated the procedure well.  Not short of breath.    Vitals:   06/19/19 1114  BP: 110/71  Pulse: 81  Temp: (!) 94.6 F (34.8 C)  SpO2: 93%     EXAM:  Resp: Lungs are clear bilaterally.  No respiratory distress, normal effort. Heart:  Regular without murmurs Abd:  Abdomen is soft, non distended and non tender. No masses are palpable.  There is no rebound and no guarding.  Neurological: Alert and oriented to person, place, and time. Coordination normal.  Skin: Skin is warm and dry. No rash noted. No diaphoretic. No erythema. No pallor. Biopsy site is dry Psychiatric: Normal mood and affect. Normal behavior. Judgment and thought content normal.      ASSESSMENT: Reviewed her CT biopsy with her.  Told her that I thought this was almost certainly a malignancy   PLAN:   Path is still pending.  I gave the patient my cell phone number as she was anxious to receive the diagnosis.  She will call me later this afternoon.    Nestor Lewandowsky, MD

## 2019-06-19 NOTE — Patient Instructions (Addendum)
Dr.Oaks discussed with patient that once her pathology results are received he would give her call. Patient was provided with Dr.Oaks contact information and was informed to give him a call around 3 pm.  Pulmonary Nodule A pulmonary nodule is tissue that has grown on your lung. A nodule may be cancer, but most nodules are not cancer. Follow these instructions at home:   Take over-the-counter and prescription medicines only as told by your doctor.  Do not use any products that have nicotine or tobacco, such as cigarettes and e-cigarettes. If you need help quitting, ask your doctor.  Keep all follow-up visits as told by your doctor. This is important. Contact a doctor if:  You have trouble breathing when doing activities.  You feel sick.  You feel more tired than normal.  You do not feel like eating.  You lose weight without trying.  You have chills.  You have night sweats. Get help right away if:  You cannot catch your breath.  You start making whistling sounds when breathing (wheezing).  You cannot stop coughing.  You cough up blood.  You get dizzy.  You feel like you are going to pass out (faint).  You have sudden chest pain.  You have a fever or symptoms for more than 2-3 days.  You have a fever and your symptoms suddenly get worse. Summary  A pulmonary nodule is tissue that has grown on your lung.  Most nodules are not cancer.  Your doctor will do tests to know what kind of nodule you have, and whether you need treatment for it. This information is not intended to replace advice given to you by your health care provider. Make sure you discuss any questions you have with your health care provider. Document Revised: 02/08/2017 Document Reviewed: 02/14/2016 Elsevier Patient Education  [place].

## 2019-06-22 ENCOUNTER — Other Ambulatory Visit: Payer: Self-pay | Admitting: Anatomic Pathology & Clinical Pathology

## 2019-06-22 LAB — SURGICAL PATHOLOGY

## 2019-06-25 ENCOUNTER — Other Ambulatory Visit: Payer: Medicare Other

## 2019-06-25 ENCOUNTER — Other Ambulatory Visit: Payer: Self-pay

## 2019-06-25 ENCOUNTER — Inpatient Hospital Stay: Payer: Medicare Other

## 2019-06-25 DIAGNOSIS — Z85118 Personal history of other malignant neoplasm of bronchus and lung: Secondary | ICD-10-CM | POA: Diagnosis not present

## 2019-06-25 DIAGNOSIS — C3411 Malignant neoplasm of upper lobe, right bronchus or lung: Secondary | ICD-10-CM

## 2019-06-25 LAB — CBC WITH DIFFERENTIAL/PLATELET
Abs Immature Granulocytes: 0.02 10*3/uL (ref 0.00–0.07)
Basophils Absolute: 0 10*3/uL (ref 0.0–0.1)
Basophils Relative: 1 %
Eosinophils Absolute: 0.2 10*3/uL (ref 0.0–0.5)
Eosinophils Relative: 3 %
HCT: 40 % (ref 36.0–46.0)
Hemoglobin: 12.9 g/dL (ref 12.0–15.0)
Immature Granulocytes: 0 %
Lymphocytes Relative: 16 %
Lymphs Abs: 0.8 10*3/uL (ref 0.7–4.0)
MCH: 28.8 pg (ref 26.0–34.0)
MCHC: 32.3 g/dL (ref 30.0–36.0)
MCV: 89.3 fL (ref 80.0–100.0)
Monocytes Absolute: 0.6 10*3/uL (ref 0.1–1.0)
Monocytes Relative: 12 %
Neutro Abs: 3.6 10*3/uL (ref 1.7–7.7)
Neutrophils Relative %: 68 %
Platelets: 301 10*3/uL (ref 150–400)
RBC: 4.48 MIL/uL (ref 3.87–5.11)
RDW: 14.7 % (ref 11.5–15.5)
WBC: 5.2 10*3/uL (ref 4.0–10.5)
nRBC: 0 % (ref 0.0–0.2)

## 2019-06-25 LAB — FERRITIN: Ferritin: 12 ng/mL (ref 11–307)

## 2019-06-26 ENCOUNTER — Ambulatory Visit: Payer: Medicare Other

## 2019-06-26 NOTE — Progress Notes (Addendum)
Carol Shaffer  Carol Shaffer was presented by Dr Mike Gip at our Carol Board on 06/25/2019, which included representatives from medical oncology, radiation oncology, navigation, pathology, radiology, surgical, internal medicine, pharmacy, pulmonology, palliative care, research.  Carol Shaffer currently presents as a current patient, for [place] with history of the following treatments: active survellience, surgical intervention(s).  Additionally, we reviewed previous medical and familial history, history of present illness, and recent lab results along with all available histopathologic and imaging studies. The Carol board considered available treatment options and made the following recommendations: Surgery if a candidate per cardiothoracic surgery    The following procedures/referrals were also placed: No orders of the defined types were placed in this encounter.   Clinical Trial Status: not discussed   Staging used: To be determined  AJCC Staging:       Group: Non Small Cell Lung Cancer   National site-specific guidelines   were discussed with respect to the case.  Carol board is a meeting of clinicians from various specialty areas who evaluate and discuss patients for whom a multidisciplinary approach is being considered. Final determinations in the plan of care are those of the provider(s). The responsibility for follow up of recommendations given during Carol board is that of the provider.   Today's extended care, comprehensive team conference, Kairy was not present for the discussion and was not examined.   Multidisciplinary Carol Board is a multidisciplinary case peer review process.  Decisions discussed in the Multidisciplinary Carol Board reflect the opinions of the specialists present at the conference without having examined the patient.  Ultimately, treatment and diagnostic decisions rest with the primary provider(s) and the patient.

## 2019-06-28 NOTE — Progress Notes (Signed)
Carol Shaffer  [address], [address] [place],  [address] Phone: [phone]  Fax: [phone]   Clinic Day:  06/30/2019  Referring physician: Albina Billet, MD  Chief Complaint: Carol Shaffer is a 74 y.o. female with a history of lung cancer(2012) s/p right upper loberesection, right lower lobe lung nodule, hypogammaglobulinemia,and iron deficiency anemia who is seen for a 4 week assessment.  HPI: The patient was last seen in the hematology  clinic on 06/05/2019 for a telemedicine visit. At that time, she denied any respiratory symptoms.   We discussed referral to Dr. Faith Rogue for consideration of resection and presurgery PFTs.  If she was not felt to be a surgical candidate, biopsy and SBRT recommended.  Tumor board on 06/11/2019 recommended a referral to Dr. Genevive Bi.   She was seen by Dr. Genevive Bi on 06/12/2019.  Recommendation was for CT-guided needle biopsy as well as formal pulmonary function tests.  RLL nodule CT guided biopsy on 06/17/2019 revealed a non-small cell carcinoma.  Tumor was positive for CK7 and TTF-1 and negative for p40.  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  Tumor board on 06/25/2019 recommended surgery if she was a candidate per cardiothoracic surgery.   Labs on 06/25/2019 revealed hematocrit 40.0, hemoglobin 12.9, MCV 89.3, platelets 301,000, WBC 5,200.  Ferritin was 12.   During the interim, she was "hanging in there". She spoke with Dr. Genevive Bi and he said she is not a candidate for surgery. Patient had lung biopsy a couple weeks ago, states that she has a "spot" that is lung cancer. She is anxious to get treatment started asap. She has some muscle tenderness from where they did the biopsy, pain increases with movement.   She has occasional nausea, Zofran helps. Her diarrhea has resolved and her bowels are normal. She takes Norco for her abdominal pain. She reports that Dr. Allen Norris stated she had a bleeding ulcer which she  thinks is the reason why she gets iron infusions. She denies ice pica. She has restless legs. She is feeling tired. She denies any shortness of breath. She has a cough in the mornings related to allergies.   Patient is excited in hopes of closing on her house. She is moving to [place], [place].    Past Medical History:  Diagnosis Date  . Arthritis    hands  . Avascular necrosis of hip, right ([place])   . Blepharospasm   . Cancer [facility]) 2011   Right Upper Lobe Lobectomy  . Chronic diarrhea   . Chronic diarrhea   . Collagenous colitis   . COPD (chronic obstructive pulmonary disease) ([place])   . Depression   . Diverticulosis   . Gastric outlet obstruction   . Headache    every couple of days  . Hemorrhoid   . History of Crohn's disease   . Hypoglycemic disorder   . IDA (iron deficiency anemia)   . Intestinal adhesions   . Multiple gastric ulcers   . Multiple gastric ulcers   . 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[place])   . Stroke [facility]) 8 yrs ago   double vision left eye    Past Surgical History:  Procedure Laterality Date  . APPENDECTOMY  1989  . botox injections for blepharospasm    . BREAST SURGERY    . CATARACT EXTRACTION    . COLON RESECTION  2005  . COLONOSCOPY  10-29-2008   [name]  . COLONOSCOPY WITH PROPOFOL N/A 03/05/2017   Procedure: COLONOSCOPY WITH PROPOFOL;  Surgeon: Toledo, Benay Pike, MD;  Location: [facility] ENDOSCOPY;  Service: Gastroenterology;  Laterality: N/A;  . COLOSTOMY REVERSAL  2006  . ESOPHAGOGASTRODUODENOSCOPY (EGD) WITH PROPOFOL N/A 07/18/2016   Procedure: ESOPHAGOGASTRODUODENOSCOPY (EGD) WITH PROPOFOL;  Surgeon: Robert Bellow, MD;  Location: [facility] ENDOSCOPY;  Service: Endoscopy;  Laterality: N/A;  . ESOPHAGOGASTRODUODENOSCOPY (EGD) WITH PROPOFOL N/A 08/03/2016   Procedure: ESOPHAGOGASTRODUODENOSCOPY (EGD) WITH PROPOFOL;  Surgeon: Robert Bellow, MD;  Location: [facility] ENDOSCOPY;  Service: Endoscopy;  Laterality: N/A;  . ESOPHAGOGASTRODUODENOSCOPY (EGD)  WITH PROPOFOL N/A 09/01/2018   Procedure: ESOPHAGOGASTRODUODENOSCOPY (EGD) WITH PROPOFOL;  Surgeon: Toledo, Benay Pike, MD;  Location: [facility] ENDOSCOPY;  Service: Gastroenterology;  Laterality: N/A;  . ESOPHAGOGASTRODUODENOSCOPY (EGD) WITH PROPOFOL N/A 12/01/2018   Procedure: ESOPHAGOGASTRODUODENOSCOPY (EGD) WITH PROPOFOL;  Surgeon: Toledo, Benay Pike, MD;  Location: [facility] ENDOSCOPY;  Service: Gastroenterology;  Laterality: N/A;  . ESOPHAGOGASTRODUODENOSCOPY (EGD) WITH PROPOFOL N/A 02/23/2019   Procedure: ESOPHAGOGASTRODUODENOSCOPY (EGD) WITH PROPOFOL;  Surgeon: Lucilla Lame, MD;  Location: [facility];  Service: Endoscopy;  Laterality: N/A;  . ESOPHAGOSCOPY WITH DILITATION  2012,2015   [facility] [place]  . EYE SURGERY    . GASTRECTOMY    . gastric ulcer  1989, 1991  . JOINT REPLACEMENT     right total hip arthroplasty 03/03/02  . LAPAROSCOPIC LYSIS OF ADHESIONS    . LUNG CANCER SURGERY  2011  . LYSIS OF ADHESION    . PLACEMENT OF BREAST IMPLANTS  1973  . thoracotomy with right upper lobectomy and central compartment node dissection    . UPPER GASTROINTESTINAL ENDOSCOPY  10-29-2008   [name]    No family history on file.  Social History:  reports that she quit smoking about 11 years ago. Her smoking use included cigarettes. She has a 25.00 pack-year smoking history. She has never used smokeless tobacco. She reports current alcohol use. She reports that she does not use drugs. She has a30pack-year smoking history (30 years x1ppd). She quit when she was diagnosed with lung cancer in 2012. She denies any known exposure to radiations or toxins. 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 She is planning to move from [facility] to [place].  She closes on her current house on 06/21/2019 and her new house on 06/23/2019.  The patient is alone today.    Allergies:  Allergies  Allergen Reactions  . Hydromorphone Hcl Itching  . Bentyl [Dicyclomine] Nausea And Vomiting  . Biaxin  [Clarithromycin] Other (See Comments)    hallucinations  . Erythromycin Nausea And Vomiting  . Reglan [Metoclopramide] Other (See Comments)    tremors    Current Medications: Current Outpatient Medications  Medication Sig Dispense Refill  . acetaminophen (TYLENOL) 500 MG tablet Take 500 mg by mouth every 6 (six) hours as needed.    . brimonidine-timolol (COMBIGAN) 0.2-0.5 % ophthalmic solution INT 1 GTT IN OU BID    . HYDROcodone-acetaminophen (NORCO/VICODIN) 5-325 MG per tablet Take 1 tablet by mouth every 6 (six) hours as needed.     . loratadine (CLARITIN) 10 MG tablet Take 10 mg by mouth daily.    . Multiple Vitamin (MULTI-VITAMINS) TABS Take 1 tablet by mouth daily. once daily.    . pantoprazole (PROTONIX) 40 MG tablet Take 40 mg by mouth 2 (two) times daily.     . sucralfate (CARAFATE) 1 G tablet Take 1 g by mouth 4 (four) times daily.     . traZODone (DESYREL) 150 MG tablet Take 300 mg by mouth at bedtime.     Marland Kitchen  venlafaxine XR (EFFEXOR-XR) 150 MG 24 hr capsule Take 150 mg by mouth daily with breakfast.     . ondansetron (ZOFRAN) 4 MG tablet Take 1 tablet (4 mg total) by mouth every 8 (eight) hours as needed for nausea or vomiting. (Patient not taking: Reported on 06/30/2019) 10 tablet 0   No current facility-administered medications for this visit.    Review of Systems  Constitutional: Positive for malaise/fatigue. Negative for chills, diaphoresis, fever and weight loss (stable).       "Hanging in there".  HENT: Negative.  Negative for congestion, ear pain, hearing loss, nosebleeds, sinus pain and sore throat.   Eyes: Negative.  Negative for blurred vision, double vision and photophobia.  Respiratory: Positive for cough (in the mornings, related to allergies). Negative for hemoptysis, sputum production, shortness of breath and wheezing.   Cardiovascular: Negative.  Negative for chest pain, palpitations, claudication, leg swelling and PND.  Gastrointestinal: Positive for abdominal  pain (on Norco) and nausea (occasional; Zofran). Negative for blood in stool, constipation, diarrhea (resolved), heartburn, melena and vomiting.       Normal bowels.  Genitourinary: Negative.  Negative for dysuria, frequency, hematuria and urgency.  Musculoskeletal: Positive for myalgias (s/p biopsy). Negative for back pain and joint pain.  Skin: Negative.  Negative for rash.  Neurological: Negative.  Negative for dizziness, tingling, sensory change, speech change, focal weakness, weakness and headaches.  Endo/Heme/Allergies: Positive for environmental allergies (seasonal; on Claritin). Does not bruise/bleed easily.  Psychiatric/Behavioral: Negative for depression and memory loss. The patient is nervous/anxious (wants to start treatment ASAP). The patient does not have insomnia.   All other systems reviewed and are negative.  Performance status (ECOG):  1  Vitals Blood pressure 130/69, pulse 77, temperature 98.1 F (36.7 C), temperature source Tympanic, weight 85 lb 3.3 oz (38.6 kg), SpO2 95 %.   Physical Exam Vitals and nursing note reviewed.  Constitutional:      General: She is not in acute distress.    Appearance: She is well-developed and well-nourished. She is not diaphoretic.  HENT:     Head: Normocephalic and atraumatic.     Mouth/Throat:     Mouth: Oropharynx is clear and moist.     Pharynx: No oropharyngeal exudate.      Comments: Short light brown hair.  Mask. Eyes:     General: No scleral icterus.    Extraocular Movements: EOM normal.     Conjunctiva/sclera: Conjunctivae normal.     Pupils: Pupils are equal, round, and reactive to light.     Comments: Glasses.  Blue eyes.  Cardiovascular:     Rate and Rhythm: Normal rate and regular rhythm.     Heart sounds: Normal heart sounds. No murmur heard.   Pulmonary:     Effort: Pulmonary effort is normal. No respiratory distress.     Breath sounds: Normal breath sounds. No wheezing or rales.  Chest:     Chest wall: No  tenderness.  Abdominal:     General: Bowel sounds are normal. There is no distension.     Palpations: Abdomen is soft. There is no mass.     Tenderness: There is no abdominal tenderness. There is no guarding or rebound.  Musculoskeletal:        General: Tenderness (biopsy site) present. No edema. Normal range of motion.     Cervical back: Normal range of motion and neck supple.  Lymphadenopathy:     Head:     Right side of head: No preauricular, posterior  auricular or occipital adenopathy.     Left side of head: No preauricular, posterior auricular or occipital adenopathy.     Cervical: No cervical adenopathy.     Upper Body:  No axillary adenopathy present.    Right upper body: No supraclavicular adenopathy.     Left upper body: No supraclavicular adenopathy.     Lower Body: No right inguinal adenopathy. No left inguinal adenopathy.  Skin:    General: Skin is warm and dry.  Neurological:     Mental Status: She is alert and oriented to person, place, and time.  Psychiatric:        Mood and Affect: Mood and affect normal.        Behavior: Behavior normal.        Thought Content: Thought content normal.        Judgment: Judgment normal.    No visits with results within 3 Day(s) from this visit.  Latest known visit with results is:  Appointment on 06/25/2019  Component Date Value Ref Range Status  . Ferritin 06/25/2019 12  11 - 307 ng/mL Final   Performed at [facility], [address] [place], [place] [address]  . WBC 06/25/2019 5.2  4.0 - 10.5 K/uL Final  . RBC 06/25/2019 4.48  3.87 - 5.11 MIL/uL Final  . Hemoglobin 06/25/2019 12.9  12.0 - 15.0 g/dL Final  . HCT 06/25/2019 40.0  36.0 - 46.0 % Final  . MCV 06/25/2019 89.3  80.0 - 100.0 fL Final  . MCH 06/25/2019 28.8  26.0 - 34.0 pg Final  . MCHC 06/25/2019 32.3  30.0 - 36.0 g/dL Final  . RDW 06/25/2019 14.7  11.5 - 15.5 % Final  . Platelets 06/25/2019 301  150 - 400 K/uL Final  . nRBC 06/25/2019 0.0  0.0 - 0.2 %  Final  . Neutrophils Relative % 06/25/2019 68  % Final  . Neutro Abs 06/25/2019 3.6  1.7 - 7.7 K/uL Final  . Lymphocytes Relative 06/25/2019 16  % Final  . Lymphs Abs 06/25/2019 0.8  0.7 - 4.0 K/uL Final  . Monocytes Relative 06/25/2019 12  % Final  . Monocytes Absolute 06/25/2019 0.6  0.1 - 1.0 K/uL Final  . Eosinophils Relative 06/25/2019 3  % Final  . Eosinophils Absolute 06/25/2019 0.2  0.0 - 0.5 K/uL Final  . Basophils Relative 06/25/2019 1  % Final  . Basophils Absolute 06/25/2019 0.0  0.0 - 0.1 K/uL Final  . Immature Granulocytes 06/25/2019 0  % Final  . Abs Immature Granulocytes 06/25/2019 0.02  0.00 - 0.07 K/uL Final   Performed at [facility], [address] [place], [place] [address]    Assessment:  Carol Shaffer is a 74 y.o. female withhyopgammaglobulinemiaand iron deficiency anemia.IgG levelshas beenin the low 300s.M-proteinis 0.She has not receivedIVIG.She has not had issues with recurrent infections.  Shehas collagenous colitisand is on budesonide. She has a history of peptic ulcer disease and is s/pgastrojejunostomyin 1980s. She had asmall bowel obstruction and colonic perforation in the pasts/presection colostomy.Shehaschronicabdominalpain.  EGDon 09/01/2018 revealed esophageal plaques suspicious of candidiasis (KOH -). There was a stenosed Billroth I gastroduodenostomy, characterized by ulceration and an intact staple line. Lesionwasnot amenable to dilation.Jejunumpathology revealedenteric mucosa with pyloric metaplasiaand negative for active inflammation, anddysplasia. Stomach, pouchrevealedreactive gastropathy, negative for active inflammation,H pylori,intestinal metaplasia, dysplasia, and malignancy.EGD on 12/01/2018 revealed a normal esophagus. There was patent Billroth I gastroduodenostomy, characterized by erythema (reactive gastropathy). There was a 1 cm hiatal hernia. There was  suspected jejunal inflammation  characterized by erosions and friability. 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 EGD on 02/23/2019 revealed a normal esophagus.  There was evidence of a stenosed Billroth I gastroduodenostomy.  Gastric pouch contained a bezoar. The gastroduodenal anastomosis was characterized by ulceration. The duodenum was normal.  Gastric biopsy revealed reactive gastropathy with reparative changes and focal scattered neuroendocrine aggregates. There was no H pylori or dysplasia.   She has a history of lung cancerin 2012 s/p right upper loberesection.Low dose chest CTon 09/04/2018 revealed multiplesmall pulmonary nodules are noted in the lungs bilaterally. In addition, therewasa 1.53 cmconcerning nodule in the right lower lobe. There was diffuse bronchial wall thickening with mild centrilobular and paraseptal emphysema; imaging findings suggestive of underlying COPD.   PET scanon 09/16/2018 revealed low level FDG uptake associated with the right lower lobe lung nodule(SUV 1.57). Thiswasa nonspecific finding. Given the size of this nodule and the low level uptake,findings may reflect inflammatory or infectious nodule. Malignancy wasnot entirely excluded.  Low dose chest CT on 12/04/2018 revealed a 15.3 mm RLL nodule unchanged in size.  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  There were some ground-glass attenuation components, but is predominantly solid in appearance with some internal air bronchograms, with macrolobulated and spiculated borders, clearly increased in size.   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  Tumor was positive for CK7 and TTF-1 and negative for p40.  She is not a surgical candidate.  She has iron deficiency anemia.She received Ferahemex2(08/12/2017- 08/19/2017), 08/20/2018, 11/18/2018,  and 02/18/2019.Oral ironhas been ineffective in the past  Ferritin has been followed: 9 on 05/07/2017, 14 on 07/15/2017, 66 on 11/07/2017, 12 on 02/12/2018, 7 on 08/13/2018, 9 on 11/12/2018, 14 on 02/17/2019, 15 on 05/18/2019, 12 on 06/25/2019.  Patient has chronic hypogammaglobulinemia. SPEPrevealed no monoclonal protein. If issues with infections, consider immunology referral.  Symptomatically, she denies any respiratory symptoms.  She has a cough in the morning due to allergies.  Plan: 1.   Labs today: CBC, ferritin. 2.   History of lung cancer She iss/p RUL lobectomy 2012. Low dose chest CT on 09/04/2018 revealedmultiplesmall pulmonary nodules bilaterallyanda 1.53 cmright lower lobe nodule. PET scan on 09/16/2018 revealedlow level FDG uptake associated with the right lower lobe lung nodule(SUV 1.57).             Chest CT on 12/04/2018 revealed a stable 1.53 cm RLL nodule.               Chest CT on 06/03/2019 was personally reviewed.  Agree with radiology interpretation.   RLL nodule has increased in size and is currently 2 cm and c/w a slow growing neoplasm.  Biopsy on 06/17/2019 confirmed adenocarcinoma of the lung.  She is not a surgical candidate.  Consult radiation oncology. 3. Iron deficiency Hematocrit 40.0.  Hemoglobin 12.9.  MCV 89.3 on 06/25/2019.   Ferritin 12.  Patient receives IV iron if ferritin < 30.  Feraheme today. 4.   Consult radiation oncology. 5.   RTC in 2 months for MD assessment, labs (CBC with diff, CMP, ferritin-day before) and +/- Feraheme  I discussed the assessment and treatment plan with the patient.  The patient was provided an opportunity to ask questions and all were answered.  The patient agreed with the plan and demonstrated an understanding of the instructions.  The  patient was advised to call back if the symptoms worsen or if the condition fails to improve as anticipated.   Lequita Asal, MD, PhD    06/30/2019, 10:04 AM  I, Selena Batten, am acting as a scribe for [employer]. Mike Gip, MD.   I, Jamyla Ard C. Mike Gip, MD, have reviewed the above documentation for accuracy and completeness, and I agree with the above.

## 2019-06-29 DIAGNOSIS — C3431 Malignant neoplasm of lower lobe, right bronchus or lung: Secondary | ICD-10-CM | POA: Insufficient documentation

## 2019-06-29 DIAGNOSIS — C342 Malignant neoplasm of middle lobe, bronchus or lung: Secondary | ICD-10-CM | POA: Insufficient documentation

## 2019-06-30 ENCOUNTER — Encounter: Payer: Self-pay | Admitting: Hematology and Oncology

## 2019-06-30 ENCOUNTER — Other Ambulatory Visit: Payer: Self-pay

## 2019-06-30 ENCOUNTER — Inpatient Hospital Stay (HOSPITAL_BASED_OUTPATIENT_CLINIC_OR_DEPARTMENT_OTHER): Payer: Medicare Other | Admitting: Hematology and Oncology

## 2019-06-30 ENCOUNTER — Inpatient Hospital Stay: Payer: Medicare Other | Attending: Hematology and Oncology

## 2019-06-30 ENCOUNTER — Ambulatory Visit: Payer: Medicare Other

## 2019-06-30 VITALS — BP 130/69 | HR 77 | Temp 98.1°F | Wt 85.2 lb

## 2019-06-30 VITALS — BP 103/69 | HR 78 | Temp 98.0°F | Resp 18

## 2019-06-30 DIAGNOSIS — C3411 Malignant neoplasm of upper lobe, right bronchus or lung: Secondary | ICD-10-CM | POA: Diagnosis not present

## 2019-06-30 DIAGNOSIS — Z85118 Personal history of other malignant neoplasm of bronchus and lung: Secondary | ICD-10-CM | POA: Insufficient documentation

## 2019-06-30 DIAGNOSIS — C3431 Malignant neoplasm of lower lobe, right bronchus or lung: Secondary | ICD-10-CM | POA: Diagnosis not present

## 2019-06-30 DIAGNOSIS — D509 Iron deficiency anemia, unspecified: Secondary | ICD-10-CM | POA: Diagnosis not present

## 2019-06-30 MED ORDER — SODIUM CHLORIDE 0.9 % IV SOLN
510.0000 mg | Freq: Once | INTRAVENOUS | Status: AC
  Administered 2019-06-30: 510 mg via INTRAVENOUS
  Filled 2019-06-30: qty 17

## 2019-06-30 MED ORDER — SODIUM CHLORIDE 0.9 % IV SOLN
Freq: Once | INTRAVENOUS | Status: AC
  Filled 2019-06-30: qty 250

## 2019-06-30 NOTE — Progress Notes (Signed)
Patient had lung biopsy a couple weeks ago, states that she has a "spot" that is lung cancer and is not a candidate for surgery. She is anxious to get treatment started asap. She has some tenderness from where they did the biopsy, only when she moves. She has occasional nausea, zofran helps. She takes norco for her abdominal pain.

## 2019-07-13 ENCOUNTER — Other Ambulatory Visit: Payer: Self-pay

## 2019-07-15 ENCOUNTER — Ambulatory Visit
Admission: RE | Admit: 2019-07-15 | Discharge: 2019-07-15 | Disposition: A | Discharge status: Home / Self Care | Payer: Medicare Other | Source: Ambulatory Visit | Attending: Radiation Oncology | Admitting: Radiation Oncology

## 2019-07-15 ENCOUNTER — Encounter: Payer: Self-pay | Admitting: Radiation Oncology

## 2019-07-15 ENCOUNTER — Other Ambulatory Visit: Payer: Self-pay

## 2019-07-15 VITALS — BP 104/62 | HR 75 | Temp 96.8°F | Resp 12 | Wt 84.5 lb

## 2019-07-15 DIAGNOSIS — C3431 Malignant neoplasm of lower lobe, right bronchus or lung: Secondary | ICD-10-CM | POA: Diagnosis present

## 2019-07-15 DIAGNOSIS — F17210 Nicotine dependence, cigarettes, uncomplicated: Secondary | ICD-10-CM | POA: Diagnosis not present

## 2019-07-15 NOTE — Consult Note (Signed)
NEW PATIENT EVALUATION  Name: Carol Shaffer  MRN: [id number]  Date:   07/15/2019     DOB: [birth year]   This 74 y.o. 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  REFERRING PHYSICIAN: Albina Billet, MD  CHIEF COMPLAINT:  Chief Complaint  Patient presents with  . Lung Cancer    DIAGNOSIS: The encounter diagnosis was Primary cancer of right lower lobe of lung ([place]).   PREVIOUS INVESTIGATIONS:  PET/CT and CT scans reviewed Clinical notes reviewed Pathology report reviewed  HPI: Patient is a 74 year old female husband was a previous patient of mine over 8 years prior who presented with a right lower lobe nodule on follow-up CT scans status post right upper lobectomy 2012 for non-small cell lung cancer.  This mass is slowly increased in size over the years.  PET CT scan back in August 2020 showed low level hypermetabolic activity associated with his right lower lobe lesion.  Follow-up CT scan back in May showed characteristics compatible with a slow-growing neoplasm.  She underwent CT-guided biopsy which was positive for non-small cell lung cancer favoring adenocarcinoma on immunohistochemical stains.  She has been seen by Dr. Faith Rogue would require a full pneumonectomy based on her prior lobectomy and is now referred to radiation oncology for consideration of treatment.  She does have COPD and is also complaining of abdominal complaints secondary to gastric resection in the past for ulcerative disease.  She is also had colon resection the past for collagenous colitis.  She specifically denies weight loss cough hemoptysis or chest tightness.  PLANNED TREATMENT REGIMEN: SBRT  PAST MEDICAL HISTORY:  has a past medical history of Arthritis, Avascular necrosis of hip, right ([place]), Blepharospasm, Cancer ([place]) (2011), Chronic diarrhea,  Chronic diarrhea, Collagenous colitis, COPD (chronic obstructive pulmonary disease) ([place]), Depression, Diverticulosis, Gastric outlet obstruction, Headache, Hemorrhoid, History of Crohn's disease, Hypoglycemic disorder, IDA (iron deficiency anemia), Intestinal adhesions, Multiple gastric ulcers, Multiple gastric ulcers, 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[place]), and Stroke ([place]) (8 yrs ago).    PAST SURGICAL HISTORY:  Past Surgical History:  Procedure Laterality Date  . APPENDECTOMY  1989  . botox injections for blepharospasm    . BREAST SURGERY    . CATARACT EXTRACTION    . COLON RESECTION  2005  . COLONOSCOPY  10-29-2008   [name]  . COLONOSCOPY WITH PROPOFOL N/A 03/05/2017   Procedure: COLONOSCOPY WITH PROPOFOL;  Surgeon: Toledo, Benay Pike, MD;  Location: [facility] END[facility];  Service: Gastroenterology;  Laterality: N/A;  . COLOSTOMY REVERSAL  2006  . ESOPHAGOGASTRODUODENOSCOPY (EGD) WITH PROPOFOL N/A 07/18/2016   Procedure: ESOPHAGOGASTRODUODENOSCOPY (EGD) WITH PROPOFOL;  Surgeon: Robert Bellow, MD;  Location: [facility];  Service: Endoscopy;  Laterality: N/A;  . ESOPHAGOGASTRODUODENOSCOPY (EGD) WITH PROPOFOL N/A 08/03/2016   Procedure: ESOPHAGOGASTRODUODENOSCOPY (EGD) WITH PROPOFOL;  Surgeon: Robert Bellow, MD;  Location: [facility];  Service: Endoscopy;  Laterality: N/A;  . ESOPHAGOGASTRODUODENOSCOPY (EGD) WITH PROPOFOL N/A 09/01/2018   Procedure: ESOPHAGOGASTRODUODENOSCOPY (EGD) WITH PROPOFOL;  Surgeon: Toledo, Benay Pike, MD;  Location: [facility];  Service: Gastroenterology;  Laterality: N/A;  . ESOPHAGOGASTRODUODENOSCOPY (EGD) WITH PROPOFOL N/A 12/01/2018   Procedure: ESOPHAGOGASTRODUODENOSCOPY (EGD) WITH PROPOFOL;  Surgeon: Toledo, Benay Pike, MD;  Location: [facility];  Service: Gastroenterology;  Laterality: N/A;  . ESOPHAGOGASTRODUODENOSCOPY (EGD) WITH PROPOFOL N/A 02/23/2019   Procedure: ESOPHAGOGASTRODUODENOSCOPY (EGD) WITH PROPOFOL;  Surgeon: Allen Norris,  Darren, MD;   Location: [place];  Service: Endoscopy;  Laterality: N/A;  . ESOPHAGOSCOPY WITH DILITATION  2012,2015   [facility] [place]  . EYE SURGERY    . GASTRECTOMY    . gastric ulcer  1989, 1991  . JOINT REPLACEMENT     right total hip arthroplasty 03/03/02  . LAPAROSCOPIC LYSIS OF ADHESIONS    . LUNG CANCER SURGERY  2011  . LYSIS OF ADHESION    . PLACEMENT OF BREAST IMPLANTS  1973  . thoracotomy with right upper lobectomy and central compartment node dissection    . UPPER GASTROINTESTINAL ENDOSCOPY  10-29-2008   [name]    FAMILY HISTORY: family history is not on file.  SOCIAL HISTORY:  reports that she quit smoking about 11 years ago. Her smoking use included cigarettes. She has a 25.00 pack-year smoking history. She has never used smokeless tobacco. She reports current alcohol use. She reports that she does not use drugs.  ALLERGIES: Hydromorphone hcl, Bentyl [dicyclomine], Biaxin [clarithromycin], Erythromycin, and Reglan [metoclopramide]  MEDICATIONS:  Current Outpatient Medications  Medication Sig Dispense Refill  . acetaminophen (TYLENOL) 500 MG tablet Take 500 mg by mouth every 6 (six) hours as needed.    . brimonidine-timolol (COMBIGAN) 0.2-0.5 % ophthalmic solution INT 1 GTT IN OU BID    . HYDROcodone-acetaminophen (NORCO/VICODIN) 5-325 MG per tablet Take 1 tablet by mouth every 6 (six) hours as needed.     . loratadine (CLARITIN) 10 MG tablet Take 10 mg by mouth daily.    . Multiple Vitamin (MULTI-VITAMINS) TABS Take 1 tablet by mouth daily. once daily.    . ondansetron (ZOFRAN) 4 MG tablet Take 1 tablet (4 mg total) by mouth every 8 (eight) hours as needed for nausea or vomiting. 10 tablet 0  . pantoprazole (PROTONIX) 40 MG tablet Take 40 mg by mouth 2 (two) times daily.     . sucralfate (CARAFATE) 1 G tablet Take 1 g by mouth 4 (four) times daily.     . traZODone (DESYREL) 150 MG tablet Take 300 mg by mouth at bedtime.     Marland Kitchen venlafaxine XR (EFFEXOR-XR) 150 MG 24 hr  capsule Take 150 mg by mouth daily with breakfast.      No current facility-administered medications for this encounter.    ECOG PERFORMANCE STATUS:  0 - Asymptomatic  REVIEW OF SYSTEMS: Patient denies any weight loss, fatigue, weakness, fever, chills or night sweats. Patient denies any loss of vision, blurred vision. Patient denies any ringing  of the ears or hearing loss. No irregular heartbeat. Patient denies heart murmur or history of fainting. Patient denies any chest pain or pain radiating to her upper extremities. Patient denies any shortness of breath, difficulty breathing at night, cough or hemoptysis. Patient denies any swelling in the lower legs. Patient denies any nausea vomiting, vomiting of blood, or coffee ground material in the vomitus. Patient denies any stomach pain. Patient states has had normal bowel movements no significant constipation or diarrhea. Patient denies any dysuria, hematuria or significant nocturia. Patient denies any problems walking, swelling in the joints or loss of balance. Patient denies any skin changes, loss of hair or loss of weight. Patient denies any excessive worrying or anxiety or significant depression. Patient denies any problems with insomnia. Patient denies excessive thirst, polyuria, polydipsia. Patient denies any swollen glands, patient denies easy bruising or easy bleeding. Patient denies any recent infections, allergies or URI. Patient "s visual fields have not changed significantly in recent time.  PHYSICAL EXAM: BP 104/62 (BP Location: Left Arm, Patient Position: Sitting, Cuff Size: Small)   Pulse 75   Temp (!) 96.8 F (36 C) (Tympanic)   Resp 12   Wt 84 lb 8 oz (38.3 kg)   BMI 17.07 kg/m  Thin appearing female in NAD.  Well-developed well-nourished patient in NAD. HEENT reveals PERLA, EOMI, discs not visualized.  Oral cavity is clear. No oral mucosal lesions are identified. Neck is clear without evidence of cervical or supraclavicular  adenopathy. Lungs are clear to A&P. Cardiac examination is essentially unremarkable with regular rate and rhythm without murmur rub or thrill. Abdomen is benign with no organomegaly or masses noted. Motor sensory and DTR levels are equal and symmetric in the upper and lower extremities. Cranial nerves II through XII are grossly intact. Proprioception is intact. No peripheral adenopathy or edema is identified. No motor or sensory levels are noted. Crude visual fields are within normal range.  LABORATORY DATA: Pathology report reviewed    RADIOLOGY RESULTS: Serial CT scans PET CT scan reviewed compatible with above-stated findings   IMPRESSION: Stage I non-small cell lung cancer favoring adenocarcinoma of the right lower lobe in 74 year old female with previous right upper lobectomy for non-small cell lung cancer.  PLAN: At this time have recommended SBRT to her right lower lobe lesion.  Would plan on delivering 6000 cGy in 5 fractions.  Risks and benefits of treatment including possible skin reaction fatigue development of cough slight chance of radiation esophagitis all were discussed in detail with the patient.  I have personally set up and ordered CT simulation will use 4-dimensional treatment planning as well as motion restriction for her simulation.  Patient comprehends my recommendations well.  I would like to take this opportunity to thank you for allowing me to participate in the care of your patient.Noreene Filbert, MD

## 2019-07-23 ENCOUNTER — Ambulatory Visit
Admission: RE | Admit: 2019-07-23 | Discharge: 2019-07-23 | Disposition: A | Discharge status: Home / Self Care | Payer: Medicare Other | Source: Ambulatory Visit | Attending: Radiation Oncology | Admitting: Radiation Oncology

## 2019-07-23 DIAGNOSIS — C3431 Malignant neoplasm of lower lobe, right bronchus or lung: Secondary | ICD-10-CM | POA: Insufficient documentation

## 2019-07-24 DIAGNOSIS — C3431 Malignant neoplasm of lower lobe, right bronchus or lung: Secondary | ICD-10-CM | POA: Diagnosis not present

## 2019-08-05 ENCOUNTER — Ambulatory Visit
Admission: RE | Admit: 2019-08-05 | Discharge: 2019-08-05 | Disposition: A | Discharge status: Home / Self Care | Payer: Medicare Other | Source: Ambulatory Visit | Attending: Radiation Oncology | Admitting: Radiation Oncology

## 2019-08-05 DIAGNOSIS — C3431 Malignant neoplasm of lower lobe, right bronchus or lung: Secondary | ICD-10-CM | POA: Insufficient documentation

## 2019-08-07 ENCOUNTER — Ambulatory Visit
Admission: RE | Admit: 2019-08-07 | Discharge: 2019-08-07 | Disposition: A | Discharge status: Home / Self Care | Payer: Medicare Other | Source: Ambulatory Visit | Attending: Radiation Oncology | Admitting: Radiation Oncology

## 2019-08-07 DIAGNOSIS — C3431 Malignant neoplasm of lower lobe, right bronchus or lung: Secondary | ICD-10-CM | POA: Diagnosis not present

## 2019-08-10 ENCOUNTER — Ambulatory Visit
Admission: RE | Admit: 2019-08-10 | Discharge: 2019-08-10 | Disposition: A | Discharge status: Home / Self Care | Payer: Medicare Other | Source: Ambulatory Visit | Attending: Radiation Oncology | Admitting: Radiation Oncology

## 2019-08-10 DIAGNOSIS — C3431 Malignant neoplasm of lower lobe, right bronchus or lung: Secondary | ICD-10-CM | POA: Diagnosis not present

## 2019-08-12 ENCOUNTER — Ambulatory Visit: Payer: Medicare Other

## 2019-08-12 ENCOUNTER — Ambulatory Visit
Admission: RE | Admit: 2019-08-12 | Discharge: 2019-08-12 | Disposition: A | Discharge status: Home / Self Care | Payer: Medicare Other | Source: Ambulatory Visit | Attending: Radiation Oncology | Admitting: Radiation Oncology

## 2019-08-12 DIAGNOSIS — C3431 Malignant neoplasm of lower lobe, right bronchus or lung: Secondary | ICD-10-CM | POA: Diagnosis not present

## 2019-08-14 ENCOUNTER — Ambulatory Visit: Payer: Medicare Other

## 2019-08-17 ENCOUNTER — Ambulatory Visit
Admission: RE | Admit: 2019-08-17 | Discharge: 2019-08-17 | Disposition: A | Discharge status: Home / Self Care | Payer: Medicare Other | Source: Ambulatory Visit | Attending: Radiation Oncology | Admitting: Radiation Oncology

## 2019-08-17 DIAGNOSIS — C3431 Malignant neoplasm of lower lobe, right bronchus or lung: Secondary | ICD-10-CM | POA: Diagnosis not present

## 2019-08-18 ENCOUNTER — Telehealth: Payer: Self-pay | Admitting: *Deleted

## 2019-08-18 ENCOUNTER — Other Ambulatory Visit: Payer: Medicare Other

## 2019-08-18 NOTE — Telephone Encounter (Signed)
Patient notified that her next appointment has been changed to a telephone visit.

## 2019-08-19 ENCOUNTER — Ambulatory Visit: Payer: Medicare Other | Admitting: Hematology and Oncology

## 2019-08-19 ENCOUNTER — Ambulatory Visit: Payer: Medicare Other

## 2019-09-02 ENCOUNTER — Other Ambulatory Visit: Payer: Self-pay

## 2019-09-02 ENCOUNTER — Inpatient Hospital Stay: Payer: Medicare Other | Attending: Hematology and Oncology

## 2019-09-02 ENCOUNTER — Encounter: Payer: Self-pay | Admitting: Hematology and Oncology

## 2019-09-02 DIAGNOSIS — Z933 Colostomy status: Secondary | ICD-10-CM | POA: Diagnosis not present

## 2019-09-02 DIAGNOSIS — C3431 Malignant neoplasm of lower lobe, right bronchus or lung: Secondary | ICD-10-CM

## 2019-09-02 DIAGNOSIS — J449 Chronic obstructive pulmonary disease, unspecified: Secondary | ICD-10-CM | POA: Diagnosis not present

## 2019-09-02 DIAGNOSIS — F329 Major depressive disorder, single episode, unspecified: Secondary | ICD-10-CM | POA: Diagnosis not present

## 2019-09-02 DIAGNOSIS — Z79899 Other long term (current) drug therapy: Secondary | ICD-10-CM | POA: Insufficient documentation

## 2019-09-02 DIAGNOSIS — Z87891 Personal history of nicotine dependence: Secondary | ICD-10-CM | POA: Diagnosis not present

## 2019-09-02 DIAGNOSIS — Z8673 Personal history of transient ischemic attack (TIA), and cerebral infarction without residual deficits: Secondary | ICD-10-CM | POA: Diagnosis not present

## 2019-09-02 DIAGNOSIS — D509 Iron deficiency anemia, unspecified: Secondary | ICD-10-CM | POA: Diagnosis present

## 2019-09-02 DIAGNOSIS — C3411 Malignant neoplasm of upper lobe, right bronchus or lung: Secondary | ICD-10-CM

## 2019-09-02 DIAGNOSIS — Z902 Acquired absence of lung [part of]: Secondary | ICD-10-CM | POA: Diagnosis not present

## 2019-09-02 DIAGNOSIS — D801 Nonfamilial hypogammaglobulinemia: Secondary | ICD-10-CM | POA: Diagnosis present

## 2019-09-02 DIAGNOSIS — Z85118 Personal history of other malignant neoplasm of bronchus and lung: Secondary | ICD-10-CM | POA: Insufficient documentation

## 2019-09-02 LAB — CBC WITH DIFFERENTIAL/PLATELET
Abs Immature Granulocytes: 0.03 10*3/uL (ref 0.00–0.07)
Basophils Absolute: 0 10*3/uL (ref 0.0–0.1)
Basophils Relative: 1 %
Eosinophils Absolute: 0.2 10*3/uL (ref 0.0–0.5)
Eosinophils Relative: 3 %
HCT: 39.4 % (ref 36.0–46.0)
Hemoglobin: 12.7 g/dL (ref 12.0–15.0)
Immature Granulocytes: 1 %
Lymphocytes Relative: 13 %
Lymphs Abs: 0.7 10*3/uL (ref 0.7–4.0)
MCH: 30 pg (ref 26.0–34.0)
MCHC: 32.2 g/dL (ref 30.0–36.0)
MCV: 92.9 fL (ref 80.0–100.0)
Monocytes Absolute: 0.7 10*3/uL (ref 0.1–1.0)
Monocytes Relative: 13 %
Neutro Abs: 3.7 10*3/uL (ref 1.7–7.7)
Neutrophils Relative %: 69 %
Platelets: 263 10*3/uL (ref 150–400)
RBC: 4.24 MIL/uL (ref 3.87–5.11)
RDW: 15 % (ref 11.5–15.5)
WBC: 5.2 10*3/uL (ref 4.0–10.5)
nRBC: 0 % (ref 0.0–0.2)

## 2019-09-02 LAB — FERRITIN: Ferritin: 44 ng/mL (ref 11–307)

## 2019-09-02 LAB — COMPREHENSIVE METABOLIC PANEL
ALT: 22 U/L (ref 0–44)
AST: 23 U/L (ref 15–41)
Albumin: 3.1 g/dL — ABNORMAL LOW (ref 3.5–5.0)
Alkaline Phosphatase: 44 U/L (ref 38–126)
Anion gap: 9 (ref 5–15)
BUN: 23 mg/dL (ref 8–23)
CO2: 30 mmol/L (ref 22–32)
Calcium: 8.1 mg/dL — ABNORMAL LOW (ref 8.9–10.3)
Chloride: 99 mmol/L (ref 98–111)
Creatinine, Ser: 0.72 mg/dL (ref 0.44–1.00)
GFR calc Af Amer: >60 mL/min (ref >60)
GFR calc non Af Amer: >60 mL/min (ref >60)
Glucose, Bld: 111 mg/dL — ABNORMAL HIGH (ref 70–99)
Potassium: 4.2 mmol/L (ref 3.5–5.1)
Sodium: 138 mmol/L (ref 135–145)
Total Bilirubin: 0.5 mg/dL (ref 0.3–1.2)
Total Protein: 5.5 g/dL — ABNORMAL LOW (ref 6.5–8.1)

## 2019-09-02 NOTE — Progress Notes (Signed)
Patient called/ pre-screened for followup appoinment  with oncologist, expresses concerns of fatigue.

## 2019-09-03 ENCOUNTER — Inpatient Hospital Stay (HOSPITAL_BASED_OUTPATIENT_CLINIC_OR_DEPARTMENT_OTHER): Payer: Medicare Other | Admitting: Hematology and Oncology

## 2019-09-03 ENCOUNTER — Other Ambulatory Visit: Payer: Medicare Other

## 2019-09-03 ENCOUNTER — Encounter: Payer: Self-pay | Admitting: *Deleted

## 2019-09-03 ENCOUNTER — Inpatient Hospital Stay: Payer: Medicare Other

## 2019-09-03 VITALS — BP 120/62 | HR 68 | Temp 98.3°F | Wt 84.8 lb

## 2019-09-03 DIAGNOSIS — C3431 Malignant neoplasm of lower lobe, right bronchus or lung: Secondary | ICD-10-CM | POA: Diagnosis not present

## 2019-09-03 DIAGNOSIS — D509 Iron deficiency anemia, unspecified: Secondary | ICD-10-CM

## 2019-09-03 NOTE — Progress Notes (Signed)
[facility]  [address], [address] [place], [place] [address] Phone: [phone]  Fax: [phone]   Clinic Day:  09/03/2019  Referring physician: Albina Billet, MD  Chief Complaint: Carol Shaffer is a 74 y.o. female with a history of lung cancer(2012) s/p right upper loberesection, right lower lobe lung nodule, hypogammaglobulinemia,and iron deficiency anemia who is seen for 2 month assessment.  HPI: The patient was last seen in the hematology  clinic on 06/30/2019. At that time, she denied any respiratory symptoms.  She had a cough in the morning due to allergies. Hematocrit was 40.0, hemoglobin 13.7, platelets 301,000, WBC 5,200. Ferritin was 12. She was not a surgical candidate so she was referred to radiation oncology. She received Feraheme.  She was seen by Dr. Baruch Gouty on 07/15/2019.  SBRT to the right lower lobe lesion was recommended.  Plan was 6000 little cGy in 5 fractions.    She received SBRT to the RLL from 08/05/2019 - 08/17/2019.  During the interim, she has been okay. She still has a cough from her allergies, which has worsened. Radiation went very well. Her nausea comes and goes and she still has abdominal pain. Her bowels are normal. Her muscle pain has resolved. She states that her weight is down a little bit, but she has been on the go a lot lately because she just moved.   Past Medical History:  Diagnosis Date  . Arthritis    hands  . Avascular necrosis of hip, right ([place])   . Blepharospasm   . Cancer [facility]) 2011   Right Upper Lobe Lobectomy  . Chronic diarrhea   . Chronic diarrhea   . Collagenous colitis   . COPD (chronic obstructive pulmonary disease) ([place])   . Depression   . Diverticulosis   . Gastric outlet obstruction   . Headache    every couple of days  . Hemorrhoid   . History of Crohn's disease   . Hypoglycemic disorder   . IDA (iron deficiency anemia)   . Intestinal adhesions   . Multiple gastric ulcers   .  Multiple gastric ulcers   . 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[place])   . Stroke [facility]) 8 yrs ago   double vision left eye    Past Surgical History:  Procedure Laterality Date  . APPENDECTOMY  1989  . botox injections for blepharospasm    . BREAST SURGERY    . CATARACT EXTRACTION    . COLON RESECTION  2005  . COLONOSCOPY  10-29-2008   [name]  . COLONOSCOPY WITH PROPOFOL N/A 03/05/2017   Procedure: COLONOSCOPY WITH PROPOFOL;  Surgeon: Toledo, Carol Pike, MD;  Location: [facility] ENDOSCOPY;  Service: Gastroenterology;  Laterality: N/A;  . COLOSTOMY REVERSAL  2006  . ESOPHAGOGASTRODUODENOSCOPY (EGD) WITH PROPOFOL N/A 07/18/2016   Procedure: ESOPHAGOGASTRODUODENOSCOPY (EGD) WITH PROPOFOL;  Surgeon: Robert Bellow, MD;  Location: [facility] ENDOSCOPY;  Service: Endoscopy;  Laterality: N/A;  . ESOPHAGOGASTRODUODENOSCOPY (EGD) WITH PROPOFOL N/A 08/03/2016   Procedure: ESOPHAGOGASTRODUODENOSCOPY (EGD) WITH PROPOFOL;  Surgeon: Robert Bellow, MD;  Location: [facility] ENDOSCOPY;  Service: Endoscopy;  Laterality: N/A;  . ESOPHAGOGASTRODUODENOSCOPY (EGD) WITH PROPOFOL N/A 09/01/2018   Procedure: ESOPHAGOGASTRODUODENOSCOPY (EGD) WITH PROPOFOL;  Surgeon: Toledo, Carol Pike, MD;  Location: [facility] ENDOSCOPY;  Service: Gastroenterology;  Laterality: N/A;  . ESOPHAGOGASTRODUODENOSCOPY (EGD) WITH PROPOFOL N/A 12/01/2018   Procedure: ESOPHAGOGASTRODUODENOSCOPY (EGD) WITH PROPOFOL;  Surgeon: Toledo, Carol Pike, MD;  Location: [facility] ENDOSCOPY;  Service: Gastroenterology;  Laterality: N/A;  . ESOPHAGOGASTRODUODENOSCOPY (EGD)  WITH PROPOFOL N/A 02/23/2019   Procedure: ESOPHAGOGASTRODUODENOSCOPY (EGD) WITH PROPOFOL;  Surgeon: Lucilla Lame, MD;  Location: [facility];  Service: Endoscopy;  Laterality: N/A;  . ESOPHAGOSCOPY WITH DILITATION  2012,2015   [facility] [place]  . EYE SURGERY    . GASTRECTOMY    . gastric ulcer  1989, 1991  . JOINT REPLACEMENT     right total hip arthroplasty 03/03/02  . LAPAROSCOPIC LYSIS OF  ADHESIONS    . LUNG CANCER SURGERY  2011  . LYSIS OF ADHESION    . PLACEMENT OF BREAST IMPLANTS  1973  . thoracotomy with right upper lobectomy and central compartment node dissection    . UPPER GASTROINTESTINAL ENDOSCOPY  10-29-2008   [name]    History reviewed. No pertinent family history.  Social History:  reports that she quit smoking about 11 years ago. Her smoking use included cigarettes. She has a 25.00 pack-year smoking history. She has never used smokeless tobacco. She reports current alcohol use. She reports that she does not use drugs. She has a30pack-year smoking history (30 years x1ppd). She quit when she was diagnosed with lung cancer in 2012. She denies any known exposure to radiations or toxins. 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 She recently moved from [facility] to [place].   The patient is alone today.    Allergies:  Allergies  Allergen Reactions  . Hydromorphone Hcl Itching  . Bentyl [Dicyclomine] Nausea And Vomiting  . Biaxin [Clarithromycin] Other (See Comments)    hallucinations  . Erythromycin Nausea And Vomiting  . Reglan [Metoclopramide] Other (See Comments)    tremors    Current Medications: Current Outpatient Medications  Medication Sig Dispense Refill  . acetaminophen (TYLENOL) 500 MG tablet Take 500 mg by mouth every 6 (six) hours as needed.    . brimonidine-timolol (COMBIGAN) 0.2-0.5 % ophthalmic solution INT 1 GTT IN OU BID    . HYDROcodone-acetaminophen (NORCO/VICODIN) 5-325 MG per tablet Take 1 tablet by mouth every 6 (six) hours as needed.     . loratadine (CLARITIN) 10 MG tablet Take 10 mg by mouth daily.    . Multiple Vitamin (MULTI-VITAMINS) TABS Take 1 tablet by mouth daily. once daily.    . pantoprazole (PROTONIX) 40 MG tablet Take 40 mg by mouth 2 (two) times daily.     . sucralfate (CARAFATE) 1 G tablet Take 1 g by mouth 4 (four) times daily.     . traZODone (DESYREL) 150 MG tablet Take 300 mg by mouth at  bedtime.     Marland Kitchen venlafaxine XR (EFFEXOR-XR) 150 MG 24 hr capsule Take 150 mg by mouth daily with breakfast.     . ondansetron (ZOFRAN) 4 MG tablet Take 1 tablet (4 mg total) by mouth every 8 (eight) hours as needed for nausea or vomiting. (Patient not taking: Reported on 09/02/2019) 10 tablet 0   No current facility-administered medications for this visit.    Review of Systems  Constitutional: Positive for weight loss (1 lbs). Negative for chills, diaphoresis, fever and malaise/fatigue.       Doing fine.  HENT: Negative.  Negative for congestion, ear discharge, ear pain, hearing loss, nosebleeds, sinus pain, sore throat and tinnitus.   Eyes: Negative.  Negative for blurred vision, double vision and photophobia.  Respiratory: Positive for cough (due to allergies). Negative for hemoptysis, sputum production, shortness of breath and wheezing.   Cardiovascular: Negative.  Negative for chest pain, palpitations, claudication, leg swelling and PND.  Gastrointestinal: Positive  for abdominal pain and nausea (comes and goes; Zofran). Negative for blood in stool, constipation, diarrhea, heartburn, melena and vomiting.       Normal bowels.  Genitourinary: Negative.  Negative for dysuria, frequency, hematuria and urgency.  Musculoskeletal: Negative.  Negative for back pain, joint pain, myalgias and neck pain.  Skin: Negative.  Negative for rash.  Neurological: Negative.  Negative for dizziness, tingling, sensory change, speech change, focal weakness, weakness and headaches.  Endo/Heme/Allergies: Positive for environmental allergies (seasonal; on Claritin). Does not bruise/bleed easily.  Psychiatric/Behavioral: Negative for depression and memory loss. The patient is not nervous/anxious and does not have insomnia.   All other systems reviewed and are negative.  Performance status (ECOG):  1  Vitals Blood pressure 120/62, pulse 68, temperature 98.3 F (36.8 C), temperature source Tympanic, weight 84 lb 12.3  oz (38.5 kg), SpO2 97 %.   Physical Exam Vitals and nursing note reviewed.  Constitutional:      General: She is not in acute distress.    Appearance: She is well-developed. She is not diaphoretic.  HENT:     Head: Normocephalic and atraumatic.     Comments: Short strawberry blonde hair.    Mouth/Throat:     Mouth: Mucous membranes are moist.     Pharynx: Oropharynx is clear. No oropharyngeal exudate.  Eyes:     General: No scleral icterus.    Extraocular Movements: Extraocular movements intact.     Conjunctiva/sclera: Conjunctivae normal.     Pupils: Pupils are equal, round, and reactive to light.     Comments: Blue eyes.  Cardiovascular:     Rate and Rhythm: Normal rate and regular rhythm.     Heart sounds: Normal heart sounds. No murmur heard.   Pulmonary:     Effort: Pulmonary effort is normal. No respiratory distress.     Breath sounds: Normal breath sounds. No wheezing or rales.  Chest:     Chest wall: No tenderness.  Abdominal:     General: Bowel sounds are normal. There is no distension.     Palpations: Abdomen is soft. There is no mass.     Tenderness: There is no abdominal tenderness. There is no guarding or rebound.  Musculoskeletal:        General: No swelling or tenderness. Normal range of motion.     Cervical back: Normal range of motion and neck supple.  Lymphadenopathy:     Head:     Right side of head: No preauricular, posterior auricular or occipital adenopathy.     Left side of head: No preauricular, posterior auricular or occipital adenopathy.     Cervical: No cervical adenopathy.     Upper Body:     Right upper body: No supraclavicular or axillary adenopathy.     Left upper body: No supraclavicular or axillary adenopathy.     Lower Body: No right inguinal adenopathy. No left inguinal adenopathy.  Skin:    General: Skin is warm and dry.  Neurological:     Mental Status: She is alert and oriented to person, place, and time.  Psychiatric:         Behavior: Behavior normal.        Thought Content: Thought content normal.        Judgment: Judgment normal.    Appointment on 09/02/2019  Component Date Value Ref Range Status  . Ferritin 09/02/2019 44  11 - 307 ng/mL Final   Performed at [facility], [address] [place], [place] [address]  .  Sodium 09/02/2019 138  135 - 145 mmol/L Final  . Potassium 09/02/2019 4.2  3.5 - 5.1 mmol/L Final  . Chloride 09/02/2019 99  98 - 111 mmol/L Final  . CO2 09/02/2019 30  22 - 32 mmol/L Final  . Glucose, Bld 09/02/2019 111* 70 - 99 mg/dL Final   Glucose reference range applies only to samples taken after fasting for at least 8 hours.  . BUN 09/02/2019 23  8 - 23 mg/dL Final  . Creatinine, Ser 09/02/2019 0.72  0.44 - 1.00 mg/dL Final  . Calcium 09/02/2019 8.1* 8.9 - 10.3 mg/dL Final  . Total Protein 09/02/2019 5.5* 6.5 - 8.1 g/dL Final  . Albumin 09/02/2019 3.1* 3.5 - 5.0 g/dL Final  . AST 09/02/2019 23  15 - 41 U/L Final  . ALT 09/02/2019 22  0 - 44 U/L Final  . Alkaline Phosphatase 09/02/2019 44  38 - 126 U/L Final  . Total Bilirubin 09/02/2019 0.5  0.3 - 1.2 mg/dL Final  . GFR calc non Af Amer 09/02/2019 >60  >60 mL/min Final  . GFR calc Af Amer 09/02/2019 >60  >60 mL/min Final  . Anion gap 09/02/2019 9  5 - 15 Final   Performed at [facility], [address], [place], [place] [address]  . WBC 09/02/2019 5.2  4.0 - 10.5 K/uL Final  . RBC 09/02/2019 4.24  3.87 - 5.11 MIL/uL Final  . Hemoglobin 09/02/2019 12.7  12.0 - 15.0 g/dL Final  . HCT 09/02/2019 39.4  36 - 46 % Final  . MCV 09/02/2019 92.9  80.0 - 100.0 fL Final  . MCH 09/02/2019 30.0  26.0 - 34.0 pg Final  . MCHC 09/02/2019 32.2  30.0 - 36.0 g/dL Final  . RDW 09/02/2019 15.0  11.5 - 15.5 % Final  . Platelets 09/02/2019 263  150 - 400 K/uL Final  . nRBC 09/02/2019 0.0  0.0 - 0.2 % Final  . Neutrophils Relative % 09/02/2019 69  % Final  . Neutro Abs 09/02/2019 3.7  1.7 - 7.7 K/uL Final  . Lymphocytes  Relative 09/02/2019 13  % Final  . Lymphs Abs 09/02/2019 0.7  0.7 - 4.0 K/uL Final  . Monocytes Relative 09/02/2019 13  % Final  . Monocytes Absolute 09/02/2019 0.7  0 - 1 K/uL Final  . Eosinophils Relative 09/02/2019 3  % Final  . Eosinophils Absolute 09/02/2019 0.2  0 - 0 K/uL Final  . Basophils Relative 09/02/2019 1  % Final  . Basophils Absolute 09/02/2019 0.0  0 - 0 K/uL Final  . Immature Granulocytes 09/02/2019 1  % Final  . Abs Immature Granulocytes 09/02/2019 0.03  0.00 - 0.07 K/uL Final   Performed at [facility], [address] [place], [place] [address]    Assessment:  Carol Shaffer is a 74 y.o. female withhyopgammaglobulinemiaand iron deficiency anemia.IgG levelshas beenin the low 300s.M-proteinis 0.She has not receivedIVIG.She has not had issues with recurrent infections.  Shehas collagenous colitisand is on budesonide. She has a history of peptic ulcer disease and is s/pgastrojejunostomyin 1980s. She had asmall bowel obstruction and colonic perforation in the pasts/presection colostomy.Shehaschronicabdominalpain.  EGDon 09/01/2018 revealed esophageal plaques suspicious of candidiasis (KOH -). There was a stenosed Billroth I gastroduodenostomy, characterized by ulceration and an intact staple line. Lesionwasnot amenable to dilation.Jejunumpathology revealedenteric mucosa with pyloric metaplasiaand negative for active inflammation, anddysplasia. Stomach, pouchrevealedreactive gastropathy, negative for active inflammation,H pylori,intestinal metaplasia, dysplasia, and malignancy.EGD on 12/01/2018 revealed a normal esophagus. There was patent  Billroth I gastroduodenostomy, characterized by erythema (reactive gastropathy). There was a 1 cm hiatal hernia. There was suspected jejunal inflammation characterized by erosions and friability. 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 EGD on 02/23/2019 revealed a normal esophagus.  There was evidence of a  stenosed Billroth I gastroduodenostomy.  Gastric pouch contained a bezoar. The gastroduodenal anastomosis was characterized by ulceration. The duodenum was normal.  Gastric biopsy revealed reactive gastropathy with reparative changes and focal scattered neuroendocrine aggregates. There was no H pylori or dysplasia.   She has a history of lung cancerin 2012 s/p right upper loberesection.Low dose chest CTon 09/04/2018 revealed multiplesmall pulmonary nodules are noted in the lungs bilaterally. In addition, therewasa 1.53 cmconcerning nodule in the right lower lobe. There was diffuse bronchial wall thickening with mild centrilobular and paraseptal emphysema; imaging findings suggestive of underlying COPD.   PET scanon 09/16/2018 revealed low level FDG uptake associated with the right lower lobe lung nodule(SUV 1.57). Thiswasa nonspecific finding. Given the size of this nodule and the low level uptake,findings may reflect inflammatory or infectious nodule. Malignancy wasnot entirely excluded.  Low dose chest CT on 12/04/2018 revealed a 15.3 mm RLL nodule unchanged in size.  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  There were some ground-glass attenuation components, but is predominantly solid in appearance with some internal air bronchograms, with macrolobulated and spiculated borders, clearly increased in size.   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  Tumor was positive for CK7 and TTF-1 and negative for p40.  She is not a surgical candidate.  She received 6000 cGy SBRT to the RLL from 08/05/2019 - 08/17/2019.  She has iron deficiency anemia.She received Ferahemex2(08/12/2017- 08/19/2017), 08/20/2018, 11/18/2018, 02/18/2019, and 06/30/2019.Oral ironhas been ineffective in the  past.  Ferritin has been followed: 9 on 05/07/2017, 14 on 07/15/2017, 66 on 11/07/2017, 12 on 02/12/2018, 7 on 08/13/2018, 9 on 11/12/2018, 14 on 02/17/2019, 15 on 05/18/2019, 12 on 06/25/2019, and 44 on 09/02/2019.  Patient has chronic hypogammaglobulinemia. SPEPrevealed no monoclonal protein. If issues with infections, consider immunology referral.  Symptomatically, she is doing well.  She has a slight cough due to allergies.  Exam is stable.  Plan: 1.   Review labs from 09/02/2019. 2.   History of lung cancer She iss/p RUL lobectomy 2012. Low dose chest CT on 09/04/2018 revealedmultiplesmall pulmonary nodules bilaterallyanda 1.53 cmright lower lobe nodule. PET scan on 09/16/2018 revealedlow level FDG uptake associated with the right lower lobe lung nodule(SUV 1.57).             Chest CT on 06/03/2019 a slowly growing right lower lobe nodule, biopsy + for adenocarcinoma on 06/17/2019.   She was not a surgical candidate.  She received 6000 cGy SBRT to the RLL from 08/05/2019 - 08/17/2019.  Continue imaging every 6 months 3. Iron deficiency Hematocrit 39.4.  Hemoglobin 12.7.  MCV 92.9 on 09/02/2019.   Ferritin 44.  Patient receives Feraheme if ferritin < 30.  No IV iron needed today. 4.   Chest CT on 11/17/2019. 5.   RTC in 6 weeks for labs (CBC, ferritin). 6.   RTC in 12 weeks for MD assessment, labs (CBC with diff, ferritin, iron studies-day before), review of chest CT, and +/- Feraheme.  I discussed the assessment and treatment plan with the patient.  The patient was provided an opportunity  to ask questions and all were answered.  The patient agreed with the plan and demonstrated an understanding of the instructions.  The patient was advised to call back if the symptoms worsen or if the condition fails to improve as anticipated.   Carol Asal, MD, PhD    09/03/2019, 9:40 AM  I, Mirian Mo Tufford, am acting as a  Education administrator for [employer]. Mike Gip, MD.   I, Melissa C. Mike Gip, MD, have reviewed the above documentation for accuracy and completeness, and I agree with the above.

## 2019-09-25 ENCOUNTER — Ambulatory Visit: Payer: Medicare Other | Admitting: Radiation Oncology

## 2019-09-28 ENCOUNTER — Ambulatory Visit
Admission: RE | Admit: 2019-09-28 | Discharge: 2019-09-28 | Disposition: A | Discharge status: Home / Self Care | Payer: Medicare Other | Source: Ambulatory Visit | Attending: Radiation Oncology | Admitting: Radiation Oncology

## 2019-09-28 DIAGNOSIS — C3431 Malignant neoplasm of lower lobe, right bronchus or lung: Secondary | ICD-10-CM

## 2019-09-28 NOTE — Progress Notes (Signed)
Radiation Oncology Follow up Note  Name: SHAYDA KALKA   Date:   09/28/2019 MRN:  [id number] DOB: 1945/10/19    This 74 y.o. female presents radiation Oncology TeleHEALTH VISIT PROGRESS NOTE  I connected with       Ivin Booty Bauers          by telephone-Webex and verified that I am speaking with the correct person using two identifiers.  I discussed the limitations, risks, security and privacy concerns of performing an evaluation and management service by telemedicine and the availability of in-person appointments. I also discussed with the patient that there may be a patient responsible charge related to this service. The patient expressed understanding and agreed to proceed.    Other persons participating in the visit and their role in the encounter: None   Patient's location: Home   Provider's location: work REFERRING PROVIDER: Albina Billet, MD  HPI: Patient is a 74 year old female now seen at 1 month having completed SBRT to her right lower lobe and patient previously treated to the right upper lobe with l lobectomy in 2012 for non-small cell lung cancer.  She states she still has a productive cough more accentuated in the morning.  She is slowly getting better and feeling improved every day.  She specifically denies hemoptysis or any change in her pulmonary status.  COMPLICATIONS OF TREATMENT: none  FOLLOW UP COMPLIANCE: keeps appointments   PHYSICAL EXAM:  There were no vitals taken for this visit. 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  And pleased with her overall progress.  She is a CT scan of the chest scheduled in October I will see her shortly thereafter in follow-up.  Patient meantime knows to call with any concerns.  I would like to take this opportunity to thank you for allowing me to participate in the care of your  patient.Noreene Filbert, MD

## 2019-10-15 ENCOUNTER — Inpatient Hospital Stay: Payer: Medicare Other | Attending: Hematology and Oncology

## 2019-10-15 ENCOUNTER — Other Ambulatory Visit: Payer: Self-pay

## 2019-10-15 DIAGNOSIS — C3431 Malignant neoplasm of lower lobe, right bronchus or lung: Secondary | ICD-10-CM

## 2019-10-15 DIAGNOSIS — D509 Iron deficiency anemia, unspecified: Secondary | ICD-10-CM | POA: Insufficient documentation

## 2019-10-15 DIAGNOSIS — Z923 Personal history of irradiation: Secondary | ICD-10-CM | POA: Diagnosis not present

## 2019-10-15 DIAGNOSIS — Z85118 Personal history of other malignant neoplasm of bronchus and lung: Secondary | ICD-10-CM | POA: Diagnosis present

## 2019-10-15 LAB — CBC
HCT: 38.4 % (ref 36.0–46.0)
Hemoglobin: 12.4 g/dL (ref 12.0–15.0)
MCH: 29.8 pg (ref 26.0–34.0)
MCHC: 32.3 g/dL (ref 30.0–36.0)
MCV: 92.3 fL (ref 80.0–100.0)
Platelets: 287 10*3/uL (ref 150–400)
RBC: 4.16 MIL/uL (ref 3.87–5.11)
RDW: 13.6 % (ref 11.5–15.5)
WBC: 4.5 10*3/uL (ref 4.0–10.5)
nRBC: 0 % (ref 0.0–0.2)

## 2019-10-15 LAB — FERRITIN: Ferritin: 22 ng/mL (ref 11–307)

## 2019-10-16 ENCOUNTER — Telehealth: Payer: Self-pay

## 2019-10-16 NOTE — Telephone Encounter (Signed)
-----   Message from Lequita Asal, MD sent at 10/15/2019  5:01 PM EDT ----- Regarding: Please call patient  Ferritin 22.  She typically receives IV iron when her ferritin is < 30.  Please schedule Feraheme x 1.  M ----- Message ----- From: Buel Ream, Lab [facility] [place] Sent: 10/15/2019   9:10 AM EDT To: Lequita Asal, MD

## 2019-10-16 NOTE — Telephone Encounter (Signed)
Left a message to inform the patient that her Ferritin levels were 22 and, Per Dr Mike Gip she will need 1 IV Feraheme. I have sent a message to Shirlean Mylar to get the patient schedule.

## 2019-10-22 ENCOUNTER — Inpatient Hospital Stay: Payer: Medicare Other

## 2019-10-22 ENCOUNTER — Other Ambulatory Visit: Payer: Self-pay

## 2019-10-22 ENCOUNTER — Telehealth: Payer: Self-pay

## 2019-10-22 VITALS — BP 96/68 | HR 78 | Resp 18

## 2019-10-22 DIAGNOSIS — D509 Iron deficiency anemia, unspecified: Secondary | ICD-10-CM

## 2019-10-22 MED ORDER — SODIUM CHLORIDE 0.9 % IV SOLN
INTRAVENOUS | Status: DC
  Filled 2019-10-22: qty 250

## 2019-10-22 MED ORDER — SODIUM CHLORIDE 0.9 % IV SOLN
510.0000 mg | Freq: Once | INTRAVENOUS | Status: AC
  Administered 2019-10-22: 510 mg via INTRAVENOUS
  Filled 2019-10-22: qty 17

## 2019-11-17 ENCOUNTER — Telehealth: Payer: Self-pay | Admitting: *Deleted

## 2019-11-17 ENCOUNTER — Ambulatory Visit
Admission: RE | Admit: 2019-11-17 | Discharge: 2019-11-17 | Disposition: A | Discharge status: Home / Self Care | Payer: Medicare Other | Source: Ambulatory Visit | Attending: Hematology and Oncology | Admitting: Hematology and Oncology

## 2019-11-17 ENCOUNTER — Other Ambulatory Visit: Payer: Self-pay

## 2019-11-17 DIAGNOSIS — C3431 Malignant neoplasm of lower lobe, right bronchus or lung: Secondary | ICD-10-CM | POA: Diagnosis not present

## 2019-11-17 NOTE — Telephone Encounter (Signed)
Called report  IMPRESSION: 1. Spiculated nodule in the RIGHT lower lobe has decreased in size. Serpiginous tubular well-defined structure extends from the lesion towards the pleural surface in the RIGHT chest. This may represent the tract from prior biopsy. The possibility of small vascular shunt to the pleural surface from the lesion is also considered given smooth, tubular appearance and serpiginous course. Consider follow-up CT with intravenous contrast for further evaluation. 2. Subtle cystic area with surrounding ground-glass attenuation in the LEFT lower lobe measures approximately 7 mm. Attention on follow-up. Developing neoplasm in this location is considered though this areas not changed since the prior study. 3. Severe pulmonary emphysema. 4. Very small pericardial effusion. 5. Cysts in the upper pole the LEFT kidney are incompletely imaged. 6. Emphysema and aortic atherosclerosis.  Aortic Atherosclerosis (ICD10-I70.0) and Emphysema (ICD10-J43.9).   Electronically Signed   By: Zetta Bills M.D.   On: 11/17/2019 13:09

## 2019-11-25 ENCOUNTER — Encounter: Payer: Self-pay | Admitting: Hematology and Oncology

## 2019-11-25 ENCOUNTER — Other Ambulatory Visit: Payer: Self-pay

## 2019-11-25 ENCOUNTER — Inpatient Hospital Stay: Payer: Medicare Other | Attending: Hematology and Oncology

## 2019-11-25 DIAGNOSIS — Z85118 Personal history of other malignant neoplasm of bronchus and lung: Secondary | ICD-10-CM | POA: Insufficient documentation

## 2019-11-25 DIAGNOSIS — K319 Disease of stomach and duodenum, unspecified: Secondary | ICD-10-CM | POA: Insufficient documentation

## 2019-11-25 DIAGNOSIS — J449 Chronic obstructive pulmonary disease, unspecified: Secondary | ICD-10-CM | POA: Insufficient documentation

## 2019-11-25 DIAGNOSIS — Z87891 Personal history of nicotine dependence: Secondary | ICD-10-CM | POA: Diagnosis not present

## 2019-11-25 DIAGNOSIS — Z79899 Other long term (current) drug therapy: Secondary | ICD-10-CM | POA: Insufficient documentation

## 2019-11-25 DIAGNOSIS — D509 Iron deficiency anemia, unspecified: Secondary | ICD-10-CM | POA: Diagnosis present

## 2019-11-25 DIAGNOSIS — Z8673 Personal history of transient ischemic attack (TIA), and cerebral infarction without residual deficits: Secondary | ICD-10-CM | POA: Diagnosis not present

## 2019-11-25 DIAGNOSIS — C3431 Malignant neoplasm of lower lobe, right bronchus or lung: Secondary | ICD-10-CM

## 2019-11-25 LAB — IRON AND TIBC
Iron: 50 ug/dL (ref 28–170)
Saturation Ratios: 18 % (ref 10.4–31.8)
TIBC: 279 ug/dL (ref 250–450)
UIBC: 229 ug/dL

## 2019-11-25 LAB — CBC WITH DIFFERENTIAL/PLATELET
Abs Immature Granulocytes: 0.02 10*3/uL (ref 0.00–0.07)
Basophils Absolute: 0 10*3/uL (ref 0.0–0.1)
Basophils Relative: 1 %
Eosinophils Absolute: 0.2 10*3/uL (ref 0.0–0.5)
Eosinophils Relative: 3 %
HCT: 40.8 % (ref 36.0–46.0)
Hemoglobin: 13.2 g/dL (ref 12.0–15.0)
Immature Granulocytes: 0 %
Lymphocytes Relative: 14 %
Lymphs Abs: 0.7 10*3/uL (ref 0.7–4.0)
MCH: 30.3 pg (ref 26.0–34.0)
MCHC: 32.4 g/dL (ref 30.0–36.0)
MCV: 93.8 fL (ref 80.0–100.0)
Monocytes Absolute: 0.6 10*3/uL (ref 0.1–1.0)
Monocytes Relative: 12 %
Neutro Abs: 3.6 10*3/uL (ref 1.7–7.7)
Neutrophils Relative %: 70 %
Platelets: 310 10*3/uL (ref 150–400)
RBC: 4.35 MIL/uL (ref 3.87–5.11)
RDW: 13.6 % (ref 11.5–15.5)
WBC: 5.1 10*3/uL (ref 4.0–10.5)
nRBC: 0 % (ref 0.0–0.2)

## 2019-11-25 LAB — FERRITIN: Ferritin: 122 ng/mL (ref 11–307)

## 2019-11-25 NOTE — Progress Notes (Signed)
The patient c/o abdomen pain ( pain level 6) but states this is nothing new. The patient name and DOB has been verified by phone.

## 2019-11-25 NOTE — Progress Notes (Signed)
[facility]  [address], [address] [place], [place] [address] Phone: [phone]  Fax: [phone]   Clinic Day:  11/26/2019  Referring physician: Albina Billet, MD  Chief Complaint: Carol Shaffer is a 74 y.o. female with a history of lung cancer(2012) s/p right upper loberesection, right lower lobe lung nodule, hypogammaglobulinemia,and iron deficiency anemia who is seen for 3 month assessment.  HPI: The patient was last seen in the hematology clinic on 09/03/2019. At that time, she was doing well.  She had a slight cough due to allergies.  Exam was stable. Hematocrit was 39.4, hemoglobin 12.7, platelets 263,000, WBC 5,200. Calcium was 8.1 and albumen 3.1. Total protein was 5.5. Ferritin was 44.  The patient saw Dr. Baruch Gouty on 09/28/2019 via telemedicine. She still had a productive cough. She was slowly getting better. Follow up was planned after CT scan.   Chest CT without contrast on 11/17/2019 revealed that the spiculated nodule in the RIGHT lower lobe had decreased in size. A serpiginous tubular well-defined structure extended from the lesion towards the pleural surface in the RIGHT chest. This may represent the tract from prior biopsy. The possibility of small vascular shunt to the pleural surface from the lesion was also considered given smooth, tubular appearance and serpiginous course.  Consider follow-up CT with intravenous contrast for further evaluation. The subtle cystic area with surrounding ground-glass attenuation in the LEFT lower lobe measured approximately 7 mm. Attention on follow-up. Developing neoplasm in this location was considered though this areas not changed since the prior study. There was severe pulmonary emphysema and very small pericardial effusion. The cysts in the upper pole the LEFT kidney were incompletely imaged. There was emphysema and aortic atherosclerosis.  Labs on 10/15/2019 revealed a hematocrit of 38.4, hemoglobin 12.4,  platelets 287,000, WBC 4,500. Ferritin 22.  She received Feraheme on 10/22/2019.  During the interim, she has been good. She still has allergy symptoms and thinks that she is allergic to her cat. She coughs up phlegm in the morning. Denies shortness of breath.   Past Medical History:  Diagnosis Date  . Arthritis    hands  . Avascular necrosis of hip, right ([place])   . Blepharospasm   . Cancer [facility]) 2011   Right Upper Lobe Lobectomy  . Chronic diarrhea   . Chronic diarrhea   . Collagenous colitis   . COPD (chronic obstructive pulmonary disease) ([place])   . Depression   . Diverticulosis   . Gastric outlet obstruction   . Headache    every couple of days  . Hemorrhoid   . History of Crohn's disease   . Hypoglycemic disorder   . IDA (iron deficiency anemia)   . Intestinal adhesions   . Multiple gastric ulcers   . Multiple gastric ulcers   . 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[place])   . Stroke [facility]) 8 yrs ago   double vision left eye    Past Surgical History:  Procedure Laterality Date  . APPENDECTOMY  1989  . botox injections for blepharospasm    . BREAST SURGERY    . CATARACT EXTRACTION    . COLON RESECTION  2005  . COLONOSCOPY  10-29-2008   [name]  . COLONOSCOPY WITH PROPOFOL N/A 03/05/2017   Procedure: COLONOSCOPY WITH PROPOFOL;  Surgeon: Toledo, Benay Pike, MD;  Location: [facility] ENDOSCOPY;  Service: Gastroenterology;  Laterality: N/A;  . COLOSTOMY REVERSAL  2006  . ESOPHAGOGASTRODUODENOSCOPY (EGD) WITH PROPOFOL N/A 07/18/2016   Procedure: ESOPHAGOGASTRODUODENOSCOPY (EGD) WITH  PROPOFOL;  Surgeon: Robert Bellow, MD;  Location: [facility] ENDOSCOPY;  Service: Endoscopy;  Laterality: N/A;  . ESOPHAGOGASTRODUODENOSCOPY (EGD) WITH PROPOFOL N/A 08/03/2016   Procedure: ESOPHAGOGASTRODUODENOSCOPY (EGD) WITH PROPOFOL;  Surgeon: Robert Bellow, MD;  Location: [facility] ENDOSCOPY;  Service: Endoscopy;  Laterality: N/A;  . ESOPHAGOGASTRODUODENOSCOPY (EGD) WITH PROPOFOL N/A 09/01/2018    Procedure: ESOPHAGOGASTRODUODENOSCOPY (EGD) WITH PROPOFOL;  Surgeon: Toledo, Benay Pike, MD;  Location: [facility] ENDOSCOPY;  Service: Gastroenterology;  Laterality: N/A;  . ESOPHAGOGASTRODUODENOSCOPY (EGD) WITH PROPOFOL N/A 12/01/2018   Procedure: ESOPHAGOGASTRODUODENOSCOPY (EGD) WITH PROPOFOL;  Surgeon: Toledo, Benay Pike, MD;  Location: [facility] ENDOSCOPY;  Service: Gastroenterology;  Laterality: N/A;  . ESOPHAGOGASTRODUODENOSCOPY (EGD) WITH PROPOFOL N/A 02/23/2019   Procedure: ESOPHAGOGASTRODUODENOSCOPY (EGD) WITH PROPOFOL;  Surgeon: Lucilla Lame, MD;  Location: [place];  Service: Endoscopy;  Laterality: N/A;  . ESOPHAGOSCOPY WITH DILITATION  2012,2015   [facility] [place]  . EYE SURGERY    . GASTRECTOMY    . gastric ulcer  1989, 1991  . JOINT REPLACEMENT     right total hip arthroplasty 03/03/02  . LAPAROSCOPIC LYSIS OF ADHESIONS    . LUNG CANCER SURGERY  2011  . LYSIS OF ADHESION    . PLACEMENT OF BREAST IMPLANTS  1973  . thoracotomy with right upper lobectomy and central compartment node dissection    . UPPER GASTROINTESTINAL ENDOSCOPY  10-29-2008   [name]    History reviewed. No pertinent family history.  Social History:  reports that she quit smoking about 11 years ago. Her smoking use included cigarettes. She has a 25.00 pack-year smoking history. She has never used smokeless tobacco. She reports current alcohol use. She reports that she does not use drugs. She has a30pack-year smoking history (30 years x1ppd). She quit when she was diagnosed with lung cancer in 2012. She denies any known exposure to radiations or toxins. 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 She recently moved from [facility] to [place].   The patient is alone today.    Allergies:  Allergies  Allergen Reactions  . Hydromorphone Hcl Itching  . Bentyl [Dicyclomine] Nausea And Vomiting  . Biaxin [Clarithromycin] Other (See Comments)    hallucinations  . Erythromycin Nausea And Vomiting   . Reglan [Metoclopramide] Other (See Comments)    tremors    Current Medications: Current Outpatient Medications  Medication Sig Dispense Refill  . acetaminophen (TYLENOL) 500 MG tablet Take 500 mg by mouth every 6 (six) hours as needed.    . brimonidine-timolol (COMBIGAN) 0.2-0.5 % ophthalmic solution INT 1 GTT IN OU BID    . HYDROcodone-acetaminophen (NORCO/VICODIN) 5-325 MG per tablet Take 1 tablet by mouth every 6 (six) hours as needed.     . loratadine (CLARITIN) 10 MG tablet Take 10 mg by mouth daily.    . Multiple Vitamin (MULTI-VITAMINS) TABS Take 1 tablet by mouth daily. once daily.    . pantoprazole (PROTONIX) 40 MG tablet Take 40 mg by mouth 2 (two) times daily.     . sucralfate (CARAFATE) 1 G tablet Take 1 g by mouth 4 (four) times daily.     . traZODone (DESYREL) 150 MG tablet Take 300 mg by mouth at bedtime.     Marland Kitchen venlafaxine XR (EFFEXOR-XR) 150 MG 24 hr capsule Take 150 mg by mouth daily with breakfast.     . ondansetron (ZOFRAN) 4 MG tablet Take 1 tablet (4 mg total) by mouth every 8 (eight) hours as needed for nausea or vomiting. (Patient not  taking: Reported on 09/02/2019) 10 tablet 0   No current facility-administered medications for this visit.    Review of Systems  Constitutional: Positive for weight loss (up 4 lbs). Negative for chills, diaphoresis, fever and malaise/fatigue.  HENT: Negative.  Negative for congestion, ear discharge, ear pain, hearing loss, nosebleeds, sinus pain, sore throat and tinnitus.   Eyes: Negative.  Negative for blurred vision, double vision and photophobia.  Respiratory: Positive for cough (due to allergies). Negative for hemoptysis, sputum production, shortness of breath and wheezing.   Cardiovascular: Negative.  Negative for chest pain, palpitations, claudication, leg swelling and PND.  Gastrointestinal: Negative for abdominal pain, blood in stool, constipation, diarrhea, heartburn, melena, nausea and vomiting.  Genitourinary: Negative.   Negative for dysuria, frequency, hematuria and urgency.  Musculoskeletal: Negative.  Negative for back pain, joint pain, myalgias and neck pain.  Skin: Negative.  Negative for itching and rash.  Neurological: Negative.  Negative for dizziness, tingling, sensory change, speech change, focal weakness, weakness and headaches.  Endo/Heme/Allergies: Positive for environmental allergies (on Claritin). Does not bruise/bleed easily.  Psychiatric/Behavioral: Negative for depression and memory loss. The patient is not nervous/anxious and does not have insomnia.   All other systems reviewed and are negative.  Performance status (ECOG):  1  Vitals Blood pressure 105/60, pulse 76, temperature (!) 97.3 F (36.3 C), temperature source Tympanic, weight 88 lb 4.7 oz (40.1 kg), SpO2 97 %.   Physical Exam Vitals and nursing note reviewed.  Constitutional:      General: She is not in acute distress.    Appearance: She is well-developed. She is not diaphoretic.  HENT:     Head: Normocephalic and atraumatic.     Comments: Short strawberry blonde hair.    Mouth/Throat:     Mouth: Mucous membranes are moist.     Pharynx: Oropharynx is clear. No oropharyngeal exudate.  Eyes:     General: No scleral icterus.    Extraocular Movements: Extraocular movements intact.     Conjunctiva/sclera: Conjunctivae normal.     Pupils: Pupils are equal, round, and reactive to light.     Comments: Blue eyes.  Cardiovascular:     Rate and Rhythm: Normal rate and regular rhythm.     Heart sounds: Normal heart sounds. No murmur heard.   Pulmonary:     Effort: Pulmonary effort is normal. No respiratory distress.     Breath sounds: Normal breath sounds. No wheezing or rales.  Chest:     Chest wall: No tenderness.  Abdominal:     General: Bowel sounds are normal. There is no distension.     Palpations: Abdomen is soft. There is no mass.     Tenderness: There is no abdominal tenderness. There is no guarding or rebound.   Musculoskeletal:        General: No swelling or tenderness. Normal range of motion.     Cervical back: Normal range of motion and neck supple.  Lymphadenopathy:     Head:     Right side of head: No preauricular, posterior auricular or occipital adenopathy.     Left side of head: No preauricular, posterior auricular or occipital adenopathy.     Cervical: No cervical adenopathy.     Upper Body:     Right upper body: No supraclavicular or axillary adenopathy.     Left upper body: No supraclavicular or axillary adenopathy.     Lower Body: No right inguinal adenopathy. No left inguinal adenopathy.  Skin:    General: Skin  is warm and dry.  Neurological:     Mental Status: She is alert and oriented to person, place, and time.  Psychiatric:        Behavior: Behavior normal.        Thought Content: Thought content normal.        Judgment: Judgment normal.    Appointment on 11/25/2019  Component Date Value Ref Range Status  . Iron 11/25/2019 50  28 - 170 ug/dL Final  . TIBC 11/25/2019 279  250 - 450 ug/dL Final  . Saturation Ratios 11/25/2019 18  10.4 - 31.8 % Final  . UIBC 11/25/2019 229  ug/dL Final   Performed at [facility], [address], [place], [place] [address]  . Ferritin 11/25/2019 122  11 - 307 ng/mL Final   Performed at [facility], [address] [place], [place] [address]  . WBC 11/25/2019 5.1  4.0 - 10.5 K/uL Final  . RBC 11/25/2019 4.35  3.87 - 5.11 MIL/uL Final  . Hemoglobin 11/25/2019 13.2  12.0 - 15.0 g/dL Final  . HCT 11/25/2019 40.8  36 - 46 % Final  . MCV 11/25/2019 93.8  80.0 - 100.0 fL Final  . MCH 11/25/2019 30.3  26.0 - 34.0 pg Final  . MCHC 11/25/2019 32.4  30.0 - 36.0 g/dL Final  . RDW 11/25/2019 13.6  11.5 - 15.5 % Final  . Platelets 11/25/2019 310  150 - 400 K/uL Final  . nRBC 11/25/2019 0.0  0.0 - 0.2 % Final  . Neutrophils Relative % 11/25/2019 70  % Final  . Neutro Abs 11/25/2019 3.6  1.7 - 7.7 K/uL Final  . Lymphocytes  Relative 11/25/2019 14  % Final  . Lymphs Abs 11/25/2019 0.7  0.7 - 4.0 K/uL Final  . Monocytes Relative 11/25/2019 12  % Final  . Monocytes Absolute 11/25/2019 0.6  0.1 - 1.0 K/uL Final  . Eosinophils Relative 11/25/2019 3  % Final  . Eosinophils Absolute 11/25/2019 0.2  0.0 - 0.5 K/uL Final  . Basophils Relative 11/25/2019 1  % Final  . Basophils Absolute 11/25/2019 0.0  0.0 - 0.1 K/uL Final  . Immature Granulocytes 11/25/2019 0  % Final  . Abs Immature Granulocytes 11/25/2019 0.02  0.00 - 0.07 K/uL Final   Performed at [facility], [address] [place], [place] [address]    Assessment:  Carol Shaffer is a 74 y.o. female withhyopgammaglobulinemiaand iron deficiency anemia.IgG levelshas beenin the low 300s.M-proteinis 0.She has not receivedIVIG.She has not had issues with recurrent infections.  Shehas collagenous colitisand is on budesonide. She has a history of peptic ulcer disease and is s/pgastrojejunostomyin 1980s. She had asmall bowel obstruction and colonic perforation in the pasts/presection colostomy.Shehaschronicabdominalpain.  EGDon 09/01/2018 revealed esophageal plaques suspicious of candidiasis (KOH -). There was a stenosed Billroth I gastroduodenostomy, characterized by ulceration and an intact staple line. Lesionwasnot amenable to dilation.Jejunumpathology revealedenteric mucosa with pyloric metaplasiaand negative for active inflammation, anddysplasia. Stomach, pouchrevealedreactive gastropathy, negative for active inflammation,H pylori,intestinal metaplasia, dysplasia, and malignancy.EGD on 12/01/2018 revealed a normal esophagus. There was patent Billroth I gastroduodenostomy, characterized by erythema (reactive gastropathy). There was a 1 cm hiatal hernia. There was suspected jejunal inflammation characterized by erosions and friability. 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 EGD on 02/23/2019 revealed a normal esophagus.  There was  evidence of a stenosed Billroth I gastroduodenostomy.  Gastric pouch contained a bezoar. The gastroduodenal anastomosis was characterized by ulceration. The duodenum was normal.  Gastric biopsy revealed reactive gastropathy with  reparative changes and focal scattered neuroendocrine aggregates. There was no H pylori or dysplasia.   She has a history of lung cancerin 2012 s/p right upper loberesection.Low dose chest CTon 09/04/2018 revealed multiplesmall pulmonary nodules are noted in the lungs bilaterally. In addition, therewasa 1.53 cmconcerning nodule in the right lower lobe. There was diffuse bronchial wall thickening with mild centrilobular and paraseptal emphysema; imaging findings suggestive of underlying COPD.   PET scanon 09/16/2018 revealed low level FDG uptake associated with the right lower lobe lung nodule(SUV 1.57). Thiswasa nonspecific finding. Given the size of this nodule and the low level uptake,findings may reflect inflammatory or infectious nodule. Malignancy wasnot entirely excluded.  Low dose chest CT on 12/04/2018 revealed a 15.3 mm RLL nodule unchanged in size.  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  There were some ground-glass attenuation components, but is predominantly solid in appearance with some internal air bronchograms, with macrolobulated and spiculated borders, clearly increased in size.   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  Tumor was positive for CK7 and TTF-1 and negative for p40.  She is not a surgical candidate.  She received 6000 cGy SBRT to the RLL from 08/05/2019 - 08/17/2019.  Chest CT without contrast on 11/17/2019 revealed a decrease in the spiculated nodule in the RIGHT lower lobe (20 x 17 mm to 15 x 13 mm).  A serpiginous tubular  well-defined structure extended from the lesion towards the pleural surface in the RIGHT chest. This may represent the tract from prior biopsy or a small vascular shunt to the pleural surface from the lesion.  Consider follow-up CT with intravenous contrast. The subtle cystic area with surrounding ground-glass attenuation in the LEFT lower lobe measured approximately 7 mm. Developing neoplasm in this location was considered though this area was unchanged since the prior study. There was severe pulmonary emphysema and very small pericardial effusion. The cysts in the upper pole the LEFT kidney were incompletely imaged.   She has iron deficiency anemia.She received Ferahemex2(08/12/2017- 08/19/2017), 08/20/2018, 11/18/2018, 02/18/2019, 06/30/2019, and 10/22/2019.Oral ironhas been ineffective in the past.  Ferritin has been followed: 9 on 05/07/2017, 14 on 07/15/2017, 66 on 11/07/2017, 12 on 02/12/2018, 7 on 08/13/2018, 9 on 11/12/2018, 14 on 02/17/2019, 15 on 05/18/2019, 12 on 06/25/2019, 44 on 09/02/2019, 22 on 10/15/2019, and 122 on 11/25/2019.  Patient has chronic hypogammaglobulinemia. SPEPrevealed no monoclonal protein. If issues with infections, consider immunology referral.  Symptomatically,  she feels good. She still has allergy symptoms and thinks that she is allergic to her cat. She coughs up phlegm in the morning. She denies shortness of breath.  Exam is stable.  Plan: 1.   Review labs from 11/25/2019. 2.   Stage I RLL lung cancer  She iss/p RUL lobectomy 2012. Low dose chest CT on 09/04/2018 revealedmultiplesmall pulmonary nodules bilaterallyanda 1.53 cmright lower lobe nodule. PET scan on 09/16/2018 revealedlow level FDG uptake associated with the right lower lobe lung nodule(SUV 1.57).             Chest CT on 06/03/2019 a slowly growing right lower lobe nodule, biopsy + for adenocarcinoma on 06/17/2019.   She was not a surgical  candidate.   She received 6000 cGy SBRT to the RLL from 08/05/2019 - 08/17/2019.  Chest CT on 11/17/2019 was personally reviewed with radiology.  Agree  with radiology findings.   No evidence of progressive disease.  The spiculated nodule has decreased in size.   Discuss serpiginous tubular structure which may be related to her biopsy.   Plan for neck CT with contrast.  Patient in agreement.  Continue surveillance imaging every 6 months. 3. Iron deficiency Hematocrit 38.4.  Hemoglobin 12.4.  MCV 92.3 on 10/15/2019.   Ferritin 22.   Patient received Feraheme.  Hematocrit 40.8.  Hemoglobin 13.2.  MCV 93.8 on 11/25/2019.   Ferritin 122.  Patient receives Feraheme if ferritin < 30.  No IV iron needed today. 4.   No Feraheme today. 5.   Chest CT on 05/17/2020. 5.   RTC in 3 months for labs (CBC, ferritin). 6.   RTC in 6 months for MD assessment, labs (CBC with diff, ferritin, iron studies-day before), review of chest CT, and +/- Feraheme.  I discussed the assessment and treatment plan with the patient.  The patient was provided an opportunity to ask questions and all were answered.  The patient agreed with the plan and demonstrated an understanding of the instructions.  The patient was advised to call back if the symptoms worsen or if the condition fails to improve as anticipated.  I provided 15 minutes of face-to-face time during this this encounter and > 50% was spent counseling as documented under my assessment and plan.  An additional 10 minutes were spent reviewing her chart (Epic and Care Everywhere) including notes, labs, and imaging studies.    Lequita Asal, MD, PhD    11/26/2019, 1:34 PM  I, Mirian Mo Tufford, am acting as a Education administrator for [employer]. Mike Gip, MD.   I, Edwina Grossberg C. Mike Gip, MD, have reviewed the above documentation for accuracy and completeness, and I agree with the above.

## 2019-11-26 ENCOUNTER — Inpatient Hospital Stay (HOSPITAL_BASED_OUTPATIENT_CLINIC_OR_DEPARTMENT_OTHER): Payer: Medicare Other | Admitting: Hematology and Oncology

## 2019-11-26 ENCOUNTER — Inpatient Hospital Stay: Payer: Medicare Other

## 2019-11-26 ENCOUNTER — Encounter: Payer: Self-pay | Admitting: Hematology and Oncology

## 2019-11-26 VITALS — BP 105/60 | HR 76 | Temp 97.3°F | Wt 88.3 lb

## 2019-11-26 DIAGNOSIS — C3431 Malignant neoplasm of lower lobe, right bronchus or lung: Secondary | ICD-10-CM | POA: Diagnosis not present

## 2019-11-26 DIAGNOSIS — D509 Iron deficiency anemia, unspecified: Secondary | ICD-10-CM

## 2019-11-26 DIAGNOSIS — Z85118 Personal history of other malignant neoplasm of bronchus and lung: Secondary | ICD-10-CM | POA: Diagnosis not present

## 2020-01-30 DIAGNOSIS — U071 COVID-19: Secondary | ICD-10-CM

## 2020-01-30 HISTORY — DX: COVID-19: U07.1

## 2020-02-25 ENCOUNTER — Inpatient Hospital Stay: Payer: Medicare Other | Attending: Hematology and Oncology

## 2020-02-25 ENCOUNTER — Other Ambulatory Visit: Payer: Self-pay

## 2020-02-25 DIAGNOSIS — C3431 Malignant neoplasm of lower lobe, right bronchus or lung: Secondary | ICD-10-CM

## 2020-02-25 DIAGNOSIS — Z85118 Personal history of other malignant neoplasm of bronchus and lung: Secondary | ICD-10-CM | POA: Insufficient documentation

## 2020-02-25 DIAGNOSIS — D509 Iron deficiency anemia, unspecified: Secondary | ICD-10-CM | POA: Diagnosis not present

## 2020-02-25 LAB — CBC
HCT: 39.7 % (ref 36.0–46.0)
Hemoglobin: 12.6 g/dL (ref 12.0–15.0)
MCH: 28.8 pg (ref 26.0–34.0)
MCHC: 31.7 g/dL (ref 30.0–36.0)
MCV: 90.6 fL (ref 80.0–100.0)
Platelets: 309 10*3/uL (ref 150–400)
RBC: 4.38 MIL/uL (ref 3.87–5.11)
RDW: 14 % (ref 11.5–15.5)
WBC: 4.9 10*3/uL (ref 4.0–10.5)
nRBC: 0 % (ref 0.0–0.2)

## 2020-02-25 LAB — FERRITIN: Ferritin: 20 ng/mL (ref 11–307)

## 2020-02-25 NOTE — Progress Notes (Signed)
Survivorship Care Plan visit completed.  Treatment summary reviewed and mailed to patient.  ASCO answers booklet reviewed and mailed to patient.  CARE program and Cancer Transitions discussed with patient along with other resources cancer center offers to patients and caregivers.  Patient verbalized understanding.

## 2020-02-26 ENCOUNTER — Telehealth: Payer: Self-pay | Admitting: Hematology and Oncology

## 2020-02-26 ENCOUNTER — Other Ambulatory Visit: Payer: Self-pay | Admitting: Hematology and Oncology

## 2020-02-26 ENCOUNTER — Telehealth: Payer: Self-pay

## 2020-02-26 NOTE — Telephone Encounter (Signed)
I spoke with patient in regards to her lab report. I let her know her hemoglobin was at 12.6 (normal) and her Ferritin was at 20. Per Carol Shaffer patient needs to consider Feraheme 1x. Patient usually receive IV iron if ferritin is <30

## 2020-02-26 NOTE — Telephone Encounter (Signed)
Spoke with pt to confirm appt on 02/29/20.

## 2020-02-29 ENCOUNTER — Inpatient Hospital Stay: Payer: Medicare Other

## 2020-02-29 ENCOUNTER — Other Ambulatory Visit: Payer: Self-pay

## 2020-02-29 VITALS — BP 121/74 | HR 74 | Temp 98.0°F | Resp 18

## 2020-02-29 DIAGNOSIS — D509 Iron deficiency anemia, unspecified: Secondary | ICD-10-CM

## 2020-02-29 MED ORDER — SODIUM CHLORIDE 0.9 % IV SOLN
Freq: Once | INTRAVENOUS | Status: AC
  Filled 2020-02-29: qty 250

## 2020-02-29 MED ORDER — SODIUM CHLORIDE 0.9 % IV SOLN
510.0000 mg | Freq: Once | INTRAVENOUS | Status: AC
  Administered 2020-02-29: 510 mg via INTRAVENOUS
  Filled 2020-02-29: qty 17

## 2020-02-29 NOTE — Progress Notes (Signed)
Patient stable at discharge.

## 2020-05-17 ENCOUNTER — Ambulatory Visit: Admission: RE | Admit: 2020-05-17 | Payer: Medicare Other | Source: Ambulatory Visit

## 2020-05-17 ENCOUNTER — Inpatient Hospital Stay: Payer: Medicare Other | Attending: Hematology and Oncology

## 2020-05-17 ENCOUNTER — Other Ambulatory Visit: Payer: Self-pay

## 2020-05-17 DIAGNOSIS — Z85118 Personal history of other malignant neoplasm of bronchus and lung: Secondary | ICD-10-CM | POA: Diagnosis not present

## 2020-05-17 DIAGNOSIS — R911 Solitary pulmonary nodule: Secondary | ICD-10-CM | POA: Insufficient documentation

## 2020-05-17 DIAGNOSIS — D509 Iron deficiency anemia, unspecified: Secondary | ICD-10-CM | POA: Diagnosis present

## 2020-05-17 DIAGNOSIS — C3431 Malignant neoplasm of lower lobe, right bronchus or lung: Secondary | ICD-10-CM

## 2020-05-17 DIAGNOSIS — D801 Nonfamilial hypogammaglobulinemia: Secondary | ICD-10-CM | POA: Diagnosis not present

## 2020-05-17 LAB — CBC WITH DIFFERENTIAL/PLATELET
Abs Immature Granulocytes: 0.02 10*3/uL (ref 0.00–0.07)
Basophils Absolute: 0 10*3/uL (ref 0.0–0.1)
Basophils Relative: 1 %
Eosinophils Absolute: 0.2 10*3/uL (ref 0.0–0.5)
Eosinophils Relative: 3 %
HCT: 40.9 % (ref 36.0–46.0)
Hemoglobin: 13.1 g/dL (ref 12.0–15.0)
Immature Granulocytes: 0 %
Lymphocytes Relative: 17 %
Lymphs Abs: 1 10*3/uL (ref 0.7–4.0)
MCH: 29.7 pg (ref 26.0–34.0)
MCHC: 32 g/dL (ref 30.0–36.0)
MCV: 92.7 fL (ref 80.0–100.0)
Monocytes Absolute: 0.6 10*3/uL (ref 0.1–1.0)
Monocytes Relative: 11 %
Neutro Abs: 3.8 10*3/uL (ref 1.7–7.7)
Neutrophils Relative %: 68 %
Platelets: 294 10*3/uL (ref 150–400)
RBC: 4.41 MIL/uL (ref 3.87–5.11)
RDW: 14.5 % (ref 11.5–15.5)
WBC: 5.6 10*3/uL (ref 4.0–10.5)
nRBC: 0 % (ref 0.0–0.2)

## 2020-05-17 LAB — IRON AND TIBC
Iron: 36 ug/dL (ref 28–170)
Saturation Ratios: 13 % (ref 10.4–31.8)
TIBC: 273 ug/dL (ref 250–450)
UIBC: 237 ug/dL

## 2020-05-17 LAB — FERRITIN: Ferritin: 40 ng/mL (ref 11–307)

## 2020-05-17 NOTE — Progress Notes (Incomplete)
[facility]  [address] [place], [place] [address] Phone: [phone]  Fax: [phone]   Clinic Day:  05/17/2020  Referring physician: Albina Billet, MD  Chief Complaint: Carol Shaffer is a 75 y.o. female with a history of lung cancer(2012) s/p right upper loberesection, right lower lobe lung nodule, hypogammaglobulinemia,and iron deficiency anemia who is seen for 6 month assessment.  HPI: The patient was last seen in the hematology clinic on 11/26/2019. At that time, she felt good. She still had allergy symptoms and thought that she was allergic to her cat. She coughed up phlegm in the morning. She denied shortness of breath.  Exam was stable. Hematocrit was 40.8, hemoglobin 13.2, platelets 310,000, WBC 5,100. Ferritin was 122 with an iron saturation of 18% and a TIBC of 279.  Labs on 02/25/2020 revealed a hematocrit of 39.7, hemoglobin 12.6, platelets 309,000, WBC 4,900. Ferritin was 20. She received Feraheme on 02/29/2020.  Chest CT with contrast on 05/17/2020 was cancelled and rescheduled for 05/26/2020.  During the interim, ***  Past Medical History:  Diagnosis Date  . Arthritis    hands  . Avascular necrosis of hip, right ([place])   . Blepharospasm   . Cancer [facility]) 2011   Right Upper Lobe Lobectomy  . Chronic diarrhea   . Chronic diarrhea   . Collagenous colitis   . COPD (chronic obstructive pulmonary disease) ([place])   . Depression   . Diverticulosis   . Gastric outlet obstruction   . Headache    every couple of days  . Hemorrhoid   . History of Crohn's disease   . Hypoglycemic disorder   . IDA (iron deficiency anemia)   . Intestinal adhesions   . Multiple gastric ulcers   . Multiple gastric ulcers   . 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[place])   . Stroke [facility]) 8 yrs ago   double vision left eye    Past Surgical History:  Procedure Laterality Date  . APPENDECTOMY  1989  . botox injections for blepharospasm    .  BREAST SURGERY    . CATARACT EXTRACTION    . COLON RESECTION  2005  . COLONOSCOPY  10-29-2008   [name]  . COLONOSCOPY WITH PROPOFOL N/A 03/05/2017   Procedure: COLONOSCOPY WITH PROPOFOL;  Surgeon: Toledo, Benay Pike, MD;  Location: [facility] ENDOSCOPY;  Service: Gastroenterology;  Laterality: N/A;  . COLOSTOMY REVERSAL  2006  . ESOPHAGOGASTRODUODENOSCOPY (EGD) WITH PROPOFOL N/A 07/18/2016   Procedure: ESOPHAGOGASTRODUODENOSCOPY (EGD) WITH PROPOFOL;  Surgeon: Robert Bellow, MD;  Location: [facility] ENDOSCOPY;  Service: Endoscopy;  Laterality: N/A;  . ESOPHAGOGASTRODUODENOSCOPY (EGD) WITH PROPOFOL N/A 08/03/2016   Procedure: ESOPHAGOGASTRODUODENOSCOPY (EGD) WITH PROPOFOL;  Surgeon: Robert Bellow, MD;  Location: [facility] ENDOSCOPY;  Service: Endoscopy;  Laterality: N/A;  . ESOPHAGOGASTRODUODENOSCOPY (EGD) WITH PROPOFOL N/A 09/01/2018   Procedure: ESOPHAGOGASTRODUODENOSCOPY (EGD) WITH PROPOFOL;  Surgeon: Toledo, Benay Pike, MD;  Location: [facility] ENDOSCOPY;  Service: Gastroenterology;  Laterality: N/A;  . ESOPHAGOGASTRODUODENOSCOPY (EGD) WITH PROPOFOL N/A 12/01/2018   Procedure: ESOPHAGOGASTRODUODENOSCOPY (EGD) WITH PROPOFOL;  Surgeon: Toledo, Benay Pike, MD;  Location: [facility] ENDOSCOPY;  Service: Gastroenterology;  Laterality: N/A;  . ESOPHAGOGASTRODUODENOSCOPY (EGD) WITH PROPOFOL N/A 02/23/2019   Procedure: ESOPHAGOGASTRODUODENOSCOPY (EGD) WITH PROPOFOL;  Surgeon: Lucilla Lame, MD;  Location: [place];  Service: Endoscopy;  Laterality: N/A;  . ESOPHAGOSCOPY WITH DILITATION  2012,2015   [facility] [place]  . EYE SURGERY    . GASTRECTOMY    . gastric ulcer  1989, 1991  .  JOINT REPLACEMENT     right total hip arthroplasty 03/03/02  . LAPAROSCOPIC LYSIS OF ADHESIONS    . LUNG CANCER SURGERY  2011  . LYSIS OF ADHESION    . PLACEMENT OF BREAST IMPLANTS  1973  . thoracotomy with right upper lobectomy and central compartment node dissection    . UPPER GASTROINTESTINAL ENDOSCOPY  10-29-2008   [name]     No family history on file.  Social History:  reports that she quit smoking about 12 years ago. Her smoking use included cigarettes. She has a 25.00 pack-year smoking history. She has never used smokeless tobacco. She reports current alcohol use. She reports that she does not use drugs. She has a30pack-year smoking history (30 years x1ppd). She quit when she was diagnosed with lung cancer in 2012. She denies any known exposure to radiations or toxins. 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 She recently moved from [facility] to [place].   The patient is alone*** today.   Allergies:  Allergies  Allergen Reactions  . Hydromorphone Hcl Itching  . Bentyl [Dicyclomine] Nausea And Vomiting  . Biaxin [Clarithromycin] Other (See Comments)    hallucinations  . Erythromycin Nausea And Vomiting  . Reglan [Metoclopramide] Other (See Comments)    tremors    Current Medications: Current Outpatient Medications  Medication Sig Dispense Refill  . acetaminophen (TYLENOL) 500 MG tablet Take 500 mg by mouth every 6 (six) hours as needed.    . brimonidine-timolol (COMBIGAN) 0.2-0.5 % ophthalmic solution INT 1 GTT IN OU BID    . HYDROcodone-acetaminophen (NORCO/VICODIN) 5-325 MG per tablet Take 1 tablet by mouth every 6 (six) hours as needed.     . loratadine (CLARITIN) 10 MG tablet Take 10 mg by mouth daily.    . Multiple Vitamin (MULTI-VITAMINS) TABS Take 1 tablet by mouth daily. once daily.    . ondansetron (ZOFRAN) 4 MG tablet Take 1 tablet (4 mg total) by mouth every 8 (eight) hours as needed for nausea or vomiting. (Patient not taking: Reported on 09/02/2019) 10 tablet 0  . pantoprazole (PROTONIX) 40 MG tablet Take 40 mg by mouth 2 (two) times daily.     . sucralfate (CARAFATE) 1 G tablet Take 1 g by mouth 4 (four) times daily.     . traZODone (DESYREL) 150 MG tablet Take 300 mg by mouth at bedtime.     Marland Kitchen venlafaxine XR (EFFEXOR-XR) 150 MG 24 hr capsule Take 150 mg by mouth  daily with breakfast.      No current facility-administered medications for this visit.    Review of Systems  Constitutional: Positive for weight loss (up 4 lbs). Negative for chills, diaphoresis, fever and malaise/fatigue.  HENT: Negative.  Negative for congestion, ear discharge, ear pain, hearing loss, nosebleeds, sinus pain, sore throat and tinnitus.   Eyes: Negative.  Negative for blurred vision, double vision and photophobia.  Respiratory: Positive for cough (due to allergies). Negative for hemoptysis, sputum production, shortness of breath and wheezing.   Cardiovascular: Negative.  Negative for chest pain, palpitations, claudication, leg swelling and PND.  Gastrointestinal: Negative for abdominal pain, blood in stool, constipation, diarrhea, heartburn, melena, nausea and vomiting.  Genitourinary: Negative.  Negative for dysuria, frequency, hematuria and urgency.  Musculoskeletal: Negative.  Negative for back pain, joint pain, myalgias and neck pain.  Skin: Negative.  Negative for itching and rash.  Neurological: Negative.  Negative for dizziness, tingling, sensory change, speech change, focal weakness, weakness and headaches.  Endo/Heme/Allergies: Positive  for environmental allergies (on Claritin). Does not bruise/bleed easily.  Psychiatric/Behavioral: Negative for depression and memory loss. The patient is not nervous/anxious and does not have insomnia.   All other systems reviewed and are negative.  Performance status (ECOG):  1***  Vitals There were no vitals taken for this visit.   Physical Exam Vitals and nursing note reviewed.  Constitutional:      General: She is not in acute distress.    Appearance: She is well-developed. She is not diaphoretic.  HENT:     Head: Normocephalic and atraumatic.     Comments: Short strawberry blonde hair.    Mouth/Throat:     Mouth: Mucous membranes are moist.     Pharynx: Oropharynx is clear. No oropharyngeal exudate.  Eyes:      General: No scleral icterus.    Extraocular Movements: Extraocular movements intact.     Conjunctiva/sclera: Conjunctivae normal.     Pupils: Pupils are equal, round, and reactive to light.     Comments: Blue eyes.  Cardiovascular:     Rate and Rhythm: Normal rate and regular rhythm.     Heart sounds: Normal heart sounds. No murmur heard.   Pulmonary:     Effort: Pulmonary effort is normal. No respiratory distress.     Breath sounds: Normal breath sounds. No wheezing or rales.  Chest:     Chest wall: No tenderness.  Breasts:     Right: No axillary adenopathy or supraclavicular adenopathy.     Left: No axillary adenopathy or supraclavicular adenopathy.    Abdominal:     General: Bowel sounds are normal. There is no distension.     Palpations: Abdomen is soft. There is no mass.     Tenderness: There is no abdominal tenderness. There is no guarding or rebound.  Musculoskeletal:        General: No swelling or tenderness. Normal range of motion.     Cervical back: Normal range of motion and neck supple.  Lymphadenopathy:     Head:     Right side of head: No preauricular, posterior auricular or occipital adenopathy.     Left side of head: No preauricular, posterior auricular or occipital adenopathy.     Cervical: No cervical adenopathy.     Upper Body:     Right upper body: No supraclavicular or axillary adenopathy.     Left upper body: No supraclavicular or axillary adenopathy.     Lower Body: No right inguinal adenopathy. No left inguinal adenopathy.  Skin:    General: Skin is warm and dry.  Neurological:     Mental Status: She is alert and oriented to person, place, and time.  Psychiatric:        Behavior: Behavior normal.        Thought Content: Thought content normal.        Judgment: Judgment normal.    Appointment on 05/17/2020  Component Date Value Ref Range Status  . WBC 05/17/2020 5.6  4.0 - 10.5 K/uL Final  . RBC 05/17/2020 4.41  3.87 - 5.11 MIL/uL Final  .  Hemoglobin 05/17/2020 13.1  12.0 - 15.0 g/dL Final  . HCT 05/17/2020 40.9  36.0 - 46.0 % Final  . MCV 05/17/2020 92.7  80.0 - 100.0 fL Final  . MCH 05/17/2020 29.7  26.0 - 34.0 pg Final  . MCHC 05/17/2020 32.0  30.0 - 36.0 g/dL Final  . RDW 05/17/2020 14.5  11.5 - 15.5 % Final  . Platelets 05/17/2020 294  150 -  400 K/uL Final  . nRBC 05/17/2020 0.0  0.0 - 0.2 % Final  . Neutrophils Relative % 05/17/2020 68  % Final  . Neutro Abs 05/17/2020 3.8  1.7 - 7.7 K/uL Final  . Lymphocytes Relative 05/17/2020 17  % Final  . Lymphs Abs 05/17/2020 1.0  0.7 - 4.0 K/uL Final  . Monocytes Relative 05/17/2020 11  % Final  . Monocytes Absolute 05/17/2020 0.6  0.1 - 1.0 K/uL Final  . Eosinophils Relative 05/17/2020 3  % Final  . Eosinophils Absolute 05/17/2020 0.2  0.0 - 0.5 K/uL Final  . Basophils Relative 05/17/2020 1  % Final  . Basophils Absolute 05/17/2020 0.0  0.0 - 0.1 K/uL Final  . Immature Granulocytes 05/17/2020 0  % Final  . Abs Immature Granulocytes 05/17/2020 0.02  0.00 - 0.07 K/uL Final   Performed at [facility], [address], [place], [place] [address]    Assessment:  Carol Shaffer is a 75 y.o. female withhyopgammaglobulinemiaand iron deficiency anemia.IgG levelshas beenin the low 300s.M-proteinis 0.She has not receivedIVIG.She has not had issues with recurrent infections.  Shehas collagenous colitisand is on budesonide. She has a history of peptic ulcer disease and is s/pgastrojejunostomyin 1980s. She had asmall bowel obstruction and colonic perforation in the pasts/presection colostomy.Shehaschronicabdominalpain.  EGDon 09/01/2018 revealed esophageal plaques suspicious of candidiasis (KOH -). There was a stenosed Billroth I gastroduodenostomy, characterized by ulceration and an intact staple line. Lesionwasnot amenable to dilation.Jejunumpathology revealedenteric mucosa with pyloric metaplasiaand negative for active inflammation,  anddysplasia. Stomach, pouchrevealedreactive gastropathy, negative for active inflammation,H pylori,intestinal metaplasia, dysplasia, and malignancy.EGD on 12/01/2018 revealed a normal esophagus. There was patent Billroth I gastroduodenostomy, characterized by erythema (reactive gastropathy). There was a 1 cm hiatal hernia. There was suspected jejunal inflammation characterized by erosions and friability. 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 EGD on 02/23/2019 revealed a normal esophagus.  There was evidence of a stenosed Billroth I gastroduodenostomy.  Gastric pouch contained a bezoar. The gastroduodenal anastomosis was characterized by ulceration. The duodenum was normal.  Gastric biopsy revealed reactive gastropathy with reparative changes and focal scattered neuroendocrine aggregates. There was no H pylori or dysplasia.   She has a history of lung cancerin 2012 s/p right upper loberesection.Low dose chest CTon 09/04/2018 revealed multiplesmall pulmonary nodules are noted in the lungs bilaterally. In addition, therewasa 1.53 cmconcerning nodule in the right lower lobe. There was diffuse bronchial wall thickening with mild centrilobular and paraseptal emphysema; imaging findings suggestive of underlying COPD.   PET scanon 09/16/2018 revealed low level FDG uptake associated with the right lower lobe lung nodule(SUV 1.57). Thiswasa nonspecific finding. Given the size of this nodule and the low level uptake,findings may reflect inflammatory or infectious nodule. Malignancy wasnot entirely excluded.  Low dose chest CT on 12/04/2018 revealed a 15.3 mm RLL nodule unchanged in size.  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  There were some ground-glass attenuation components, but is predominantly solid in appearance with some internal air bronchograms, with  macrolobulated and spiculated borders, clearly increased in size.   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  Tumor was positive for CK7 and TTF-1 and negative for p40.  She is not a surgical candidate.  She received 6000 cGy SBRT to the RLL from 08/05/2019 - 08/17/2019.  Chest CT without contrast on 11/17/2019 revealed a decrease  in the spiculated nodule in the RIGHT lower lobe (20 x 17 mm to 15 x 13 mm).  A serpiginous tubular well-defined structure extended from the lesion towards the pleural surface in the RIGHT chest. This may represent the tract from prior biopsy or a small vascular shunt to the pleural surface from the lesion.  Consider follow-up CT with intravenous contrast. The subtle cystic area with surrounding ground-glass attenuation in the LEFT lower lobe measured approximately 7 mm. Developing neoplasm in this location was considered though this area was unchanged since the prior study. There was severe pulmonary emphysema and very small pericardial effusion. The cysts in the upper pole the LEFT kidney were incompletely imaged.   She has iron deficiency anemia.She received Ferahemex2(08/12/2017- 08/19/2017), 08/20/2018, 11/18/2018, 02/18/2019, 06/30/2019, and 10/22/2019.Oral ironhas been ineffective in the past.  Ferritin has been followed: 9 on 05/07/2017, 14 on 07/15/2017, 66 on 11/07/2017, 12 on 02/12/2018, 7 on 08/13/2018, 9 on 11/12/2018, 14 on 02/17/2019, 15 on 05/18/2019, 12 on 06/25/2019, 44 on 09/02/2019, 22 on 10/15/2019, and 122 on 11/25/2019.  Patient has chronic hypogammaglobulinemia. SPEPrevealed no monoclonal protein. If issues with infections, consider immunology referral.  Symptomatically, ***  Plan: 1.   Review labs from 05/17/2020   2.   Stage I RLL lung cancer  She iss/p RUL lobectomy 2012. Low dose chest CT on 09/04/2018 revealedmultiplesmall pulmonary nodules  bilaterallyanda 1.53 cmright lower lobe nodule. PET scan on 09/16/2018 revealedlow level FDG uptake associated with the right lower lobe lung nodule(SUV 1.57).             Chest CT on 06/03/2019 a slowly growing right lower lobe nodule, biopsy + for adenocarcinoma on 06/17/2019.   She was not a surgical candidate.   She received 6000 cGy SBRT to the RLL from 08/05/2019 - 08/17/2019.  Chest CT on 11/17/2019 was personally reviewed with radiology.  Agree with radiology findings.   No evidence of progressive disease.  The spiculated nodule has decreased in size.   Discuss serpiginous tubular structure which may be related to her biopsy.   Plan for neck CT with contrast.  Patient in agreement.  Continue surveillance imaging every 6 months. 3. Iron deficiency Hematocrit 38.4.  Hemoglobin 12.4.  MCV 92.3 on 10/15/2019.   Ferritin 22.   Patient received Feraheme.  Hematocrit 40.8.  Hemoglobin 13.2.  MCV 93.8 on 11/25/2019.   Ferritin 122.  Patient receives Feraheme if ferritin < 30.  No IV iron needed today. 4.   No Feraheme today. 5.   Chest CT on 05/17/2020. 5.   RTC in 3 months for labs (CBC, ferritin). 6.   RTC in 6 months for MD assessment, labs (CBC with diff, ferritin, iron studies-day before), review of chest CT, and +/- Feraheme.  I discussed the assessment and treatment plan with the patient.  The patient was provided an opportunity to ask questions and all were answered.  The patient agreed with the plan and demonstrated an understanding of the instructions.  The patient was advised to call back if the symptoms worsen or if the condition fails to improve as anticipated.  I provided *** minutes of face-to-face time during this this encounter and > 50% was spent counseling as documented under my assessment and plan.  Lequita Asal, MD, PhD    05/17/2020, 12:25 PM  I, Mirian Mo Tufford, am acting as a Education administrator for [employer]. Mike Gip, MD.   I, Melissa  C. Mike Gip, MD, have reviewed the above documentation for accuracy and  completeness, and I agree with the above.

## 2020-05-18 ENCOUNTER — Ambulatory Visit: Payer: Medicare Other | Admitting: Hematology and Oncology

## 2020-05-18 ENCOUNTER — Ambulatory Visit: Payer: Medicare Other

## 2020-05-18 NOTE — Progress Notes (Addendum)
[facility]  [address], [address] [place], [place] [address] Phone: [phone]  Fax: [phone]   Clinic Day:  05/18/2020  Referring physician: Albina Billet, MD  Chief Complaint: Carol Shaffer is a 75 y.o. female with a history of lung cancer(2012) s/p right upper loberesection, right lower lobe lung nodule, hypogammaglobulinemia,and iron deficiency anemia who is seen for 6 month assessment.  HPI: The patient was last seen in the hematology clinic on 11/26/2019. At that time, she felt good. She still had allergy symptoms and thought that she was allergic to her cat. She had a productive cough. She denied shortness of breath.  Exam was stable.  She received Feraheme on 02/29/2020.  Chest CT with contrast on 05/26/2020 showed progressive and extensive radiation changes surrounding the right lower lobe pulmonary lesion.  Difficult to identify or measure the lesion in the middle of the radiation changes.  No mediastinal or hilar mass or adenopathy.  Stable 7 mm left lower lobe nodule.  Recommend continued surveillance.  During the interim, she reports doing well.  Has noticed some increased fatigue over the past several weeks.  Past Medical History:  Diagnosis Date  . Arthritis    hands  . Avascular necrosis of hip, right ([place])   . Blepharospasm   . Cancer [facility]) 2011   Right Upper Lobe Lobectomy  . Chronic diarrhea   . Chronic diarrhea   . Collagenous colitis   . COPD (chronic obstructive pulmonary disease) ([place])   . Depression   . Diverticulosis   . Gastric outlet obstruction   . Headache    every couple of days  . Hemorrhoid   . History of Crohn's disease   . Hypoglycemic disorder   . IDA (iron deficiency anemia)   . Intestinal adhesions   . Multiple gastric ulcers   . Multiple gastric ulcers   . 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[place])   . Stroke [facility]) 8 yrs ago   double vision left eye    Past Surgical History:  Procedure  Laterality Date  . APPENDECTOMY  1989  . botox injections for blepharospasm    . BREAST SURGERY    . CATARACT EXTRACTION    . COLON RESECTION  2005  . COLONOSCOPY  10-29-2008   [name]  . COLONOSCOPY WITH PROPOFOL N/A 03/05/2017   Procedure: COLONOSCOPY WITH PROPOFOL;  Surgeon: Toledo, Benay Pike, MD;  Location: [facility] ENDOSCOPY;  Service: Gastroenterology;  Laterality: N/A;  . COLOSTOMY REVERSAL  2006  . ESOPHAGOGASTRODUODENOSCOPY (EGD) WITH PROPOFOL N/A 07/18/2016   Procedure: ESOPHAGOGASTRODUODENOSCOPY (EGD) WITH PROPOFOL;  Surgeon: Robert Bellow, MD;  Location: [facility] ENDOSCOPY;  Service: Endoscopy;  Laterality: N/A;  . ESOPHAGOGASTRODUODENOSCOPY (EGD) WITH PROPOFOL N/A 08/03/2016   Procedure: ESOPHAGOGASTRODUODENOSCOPY (EGD) WITH PROPOFOL;  Surgeon: Robert Bellow, MD;  Location: [facility] ENDOSCOPY;  Service: Endoscopy;  Laterality: N/A;  . ESOPHAGOGASTRODUODENOSCOPY (EGD) WITH PROPOFOL N/A 09/01/2018   Procedure: ESOPHAGOGASTRODUODENOSCOPY (EGD) WITH PROPOFOL;  Surgeon: Toledo, Benay Pike, MD;  Location: [facility] ENDOSCOPY;  Service: Gastroenterology;  Laterality: N/A;  . ESOPHAGOGASTRODUODENOSCOPY (EGD) WITH PROPOFOL N/A 12/01/2018   Procedure: ESOPHAGOGASTRODUODENOSCOPY (EGD) WITH PROPOFOL;  Surgeon: Toledo, Benay Pike, MD;  Location: [facility] ENDOSCOPY;  Service: Gastroenterology;  Laterality: N/A;  . ESOPHAGOGASTRODUODENOSCOPY (EGD) WITH PROPOFOL N/A 02/23/2019   Procedure: ESOPHAGOGASTRODUODENOSCOPY (EGD) WITH PROPOFOL;  Surgeon: Lucilla Lame, MD;  Location: [facility];  Service: Endoscopy;  Laterality: N/A;  . ESOPHAGOSCOPY WITH DILITATION  2012,2015   [facility] [place]  . EYE SURGERY    .  GASTRECTOMY    . gastric ulcer  1989, 1991  . JOINT REPLACEMENT     right total hip arthroplasty 03/03/02  . LAPAROSCOPIC LYSIS OF ADHESIONS    . LUNG CANCER SURGERY  2011  . LYSIS OF ADHESION    . PLACEMENT OF BREAST IMPLANTS  1973  . thoracotomy with right upper lobectomy and central compartment  node dissection    . UPPER GASTROINTESTINAL ENDOSCOPY  10-29-2008   [name]    No family history on file.  Social History:  reports that she quit smoking about 12 years ago. Her smoking use included cigarettes. She has a 25.00 pack-year smoking history. She has never used smokeless tobacco. She reports current alcohol use. She reports that she does not use drugs. She has a30pack-year smoking history (30 years x1ppd). She quit when she was diagnosed with lung cancer in 2012. She denies any known exposure to radiations or toxins. 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 She recently moved from [place] to [place].   The patient is alone today.   Allergies:  Allergies  Allergen Reactions  . Hydromorphone Hcl Itching  . Bentyl [Dicyclomine] Nausea And Vomiting  . Biaxin [Clarithromycin] Other (See Comments)    hallucinations  . Erythromycin Nausea And Vomiting  . Reglan [Metoclopramide] Other (See Comments)    tremors    Current Medications: Current Outpatient Medications  Medication Sig Dispense Refill  . acetaminophen (TYLENOL) 500 MG tablet Take 500 mg by mouth every 6 (six) hours as needed.    . brimonidine-timolol (COMBIGAN) 0.2-0.5 % ophthalmic solution INT 1 GTT IN OU BID    . HYDROcodone-acetaminophen (NORCO/VICODIN) 5-325 MG per tablet Take 1 tablet by mouth every 6 (six) hours as needed.     . loratadine (CLARITIN) 10 MG tablet Take 10 mg by mouth daily.    . Multiple Vitamin (MULTI-VITAMINS) TABS Take 1 tablet by mouth daily. once daily.    . ondansetron (ZOFRAN) 4 MG tablet Take 1 tablet (4 mg total) by mouth every 8 (eight) hours as needed for nausea or vomiting. (Patient not taking: Reported on 09/02/2019) 10 tablet 0  . pantoprazole (PROTONIX) 40 MG tablet Take 40 mg by mouth 2 (two) times daily.     . sucralfate (CARAFATE) 1 G tablet Take 1 g by mouth 4 (four) times daily.     . traZODone (DESYREL) 150 MG tablet Take 300 mg by mouth at bedtime.      Marland Kitchen venlafaxine XR (EFFEXOR-XR) 150 MG 24 hr capsule Take 150 mg by mouth daily with breakfast.      No current facility-administered medications for this visit.    Review of Systems  Constitutional: Positive for weight loss (up 2 lbs). Negative for chills, diaphoresis, fever and malaise/fatigue.  HENT: Negative.  Negative for congestion, ear discharge, ear pain, hearing loss, nosebleeds, sinus pain, sore throat and tinnitus.   Eyes: Negative.  Negative for blurred vision, double vision and photophobia.  Respiratory: Positive for cough (due to allergies). Negative for hemoptysis, sputum production, shortness of breath and wheezing.   Cardiovascular: Negative.  Negative for chest pain, palpitations, claudication, leg swelling and PND.  Gastrointestinal: Negative for abdominal pain, blood in stool, constipation, diarrhea, heartburn, melena, nausea and vomiting.  Genitourinary: Negative.  Negative for dysuria, frequency, hematuria and urgency.  Musculoskeletal: Negative.  Negative for back pain, joint pain, myalgias and neck pain.  Skin: Negative.  Negative for itching and rash.  Neurological: Negative.  Negative for dizziness, tingling,  sensory change, speech change, focal weakness, weakness and headaches.  Endo/Heme/Allergies: Positive for environmental allergies (on Claritin). Does not bruise/bleed easily.  Psychiatric/Behavioral: Negative for depression and memory loss. The patient is not nervous/anxious and does not have insomnia.   All other systems reviewed and are negative.  Performance status (ECOG):  1  Vitals There were no vitals taken for this visit.   Physical Exam Vitals and nursing note reviewed.  Constitutional:      General: She is not in acute distress.    Appearance: She is well-developed. She is not diaphoretic.  HENT:     Head: Normocephalic and atraumatic.     Comments: Short strawberry blonde hair.    Mouth/Throat:     Mouth: Mucous membranes are moist.      Pharynx: Oropharynx is clear. No oropharyngeal exudate.  Eyes:     General: No scleral icterus.    Extraocular Movements: Extraocular movements intact.     Conjunctiva/sclera: Conjunctivae normal.     Pupils: Pupils are equal, round, and reactive to light.     Comments: Blue eyes.  Cardiovascular:     Rate and Rhythm: Normal rate and regular rhythm.     Heart sounds: Normal heart sounds. No murmur heard.   Pulmonary:     Effort: Pulmonary effort is normal. No respiratory distress.     Breath sounds: Normal breath sounds. No wheezing or rales.  Chest:     Chest wall: No tenderness.  Breasts:     Right: No axillary adenopathy or supraclavicular adenopathy.     Left: No axillary adenopathy or supraclavicular adenopathy.    Abdominal:     General: Bowel sounds are normal. There is no distension.     Palpations: Abdomen is soft. There is no mass.     Tenderness: There is no abdominal tenderness. There is no guarding or rebound.  Musculoskeletal:        General: No swelling or tenderness. Normal range of motion.     Cervical back: Normal range of motion and neck supple.  Lymphadenopathy:     Head:     Right side of head: No preauricular, posterior auricular or occipital adenopathy.     Left side of head: No preauricular, posterior auricular or occipital adenopathy.     Cervical: No cervical adenopathy.     Upper Body:     Right upper body: No supraclavicular or axillary adenopathy.     Left upper body: No supraclavicular or axillary adenopathy.     Lower Body: No right inguinal adenopathy. No left inguinal adenopathy.  Skin:    General: Skin is warm and dry.  Neurological:     Mental Status: She is alert and oriented to person, place, and time.  Psychiatric:        Behavior: Behavior normal.        Thought Content: Thought content normal.        Judgment: Judgment normal.    No visits with results within 3 Day(s) from this visit.  Latest known visit with results is:   Appointment on 05/17/2020  Component Date Value Ref Range Status  . Ferritin 05/17/2020 40  11 - 307 ng/mL Final   Performed at [facility], [address] [place], [place] [address]  . Iron 05/17/2020 36  28 - 170 ug/dL Final  . TIBC 05/17/2020 273  250 - 450 ug/dL Final  . Saturation Ratios 05/17/2020 13  10.4 - 31.8 % Final  . UIBC 05/17/2020 237  ug/dL Final   Performed at [facility], [address] [place], [place] [address]  . WBC 05/17/2020 5.6  4.0 - 10.5 K/uL Final  . RBC 05/17/2020 4.41  3.87 - 5.11 MIL/uL Final  . Hemoglobin 05/17/2020 13.1  12.0 - 15.0 g/dL Final  . HCT 05/17/2020 40.9  36.0 - 46.0 % Final  . MCV 05/17/2020 92.7  80.0 - 100.0 fL Final  . MCH 05/17/2020 29.7  26.0 - 34.0 pg Final  . MCHC 05/17/2020 32.0  30.0 - 36.0 g/dL Final  . RDW 05/17/2020 14.5  11.5 - 15.5 % Final  . Platelets 05/17/2020 294  150 - 400 K/uL Final  . nRBC 05/17/2020 0.0  0.0 - 0.2 % Final  . Neutrophils Relative % 05/17/2020 68  % Final  . Neutro Abs 05/17/2020 3.8  1.7 - 7.7 K/uL Final  . Lymphocytes Relative 05/17/2020 17  % Final  . Lymphs Abs 05/17/2020 1.0  0.7 - 4.0 K/uL Final  . Monocytes Relative 05/17/2020 11  % Final  . Monocytes Absolute 05/17/2020 0.6  0.1 - 1.0 K/uL Final  . Eosinophils Relative 05/17/2020 3  % Final  . Eosinophils Absolute 05/17/2020 0.2  0.0 - 0.5 K/uL Final  . Basophils Relative 05/17/2020 1  % Final  . Basophils Absolute 05/17/2020 0.0  0.0 - 0.1 K/uL Final  . Immature Granulocytes 05/17/2020 0  % Final  . Abs Immature Granulocytes 05/17/2020 0.02  0.00 - 0.07 K/uL Final   Performed at [facility], [address], [place], [place] [address]    Assessment:  AMBROSIA WISNEWSKI is a 75 y.o. female withhyopgammaglobulinemiaand iron deficiency anemia.IgG levelshas beenin the low 300s.M-proteinis 0.She has not receivedIVIG.She has not had issues with recurrent infections.  Shehas collagenous colitisand  is on budesonide. She has a history of peptic ulcer disease and is s/pgastrojejunostomyin 1980s. She had asmall bowel obstruction and colonic perforation in the pasts/presection colostomy.Shehaschronicabdominalpain.  EGDon 09/01/2018 revealed esophageal plaques suspicious of candidiasis (KOH -). There was a stenosed Billroth I gastroduodenostomy, characterized by ulceration and an intact staple line. Lesionwasnot amenable to dilation.Jejunumpathology revealedenteric mucosa with pyloric metaplasiaand negative for active inflammation, anddysplasia. Stomach, pouchrevealedreactive gastropathy, negative for active inflammation,H pylori,intestinal metaplasia, dysplasia, and malignancy.EGD on 12/01/2018 revealed a normal esophagus. There was patent Billroth I gastroduodenostomy, characterized by erythema (reactive gastropathy). There was a 1 cm hiatal hernia. There was suspected jejunal inflammation characterized by erosions and friability. 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 EGD on 02/23/2019 revealed a normal esophagus.  There was evidence of a stenosed Billroth I gastroduodenostomy.  Gastric pouch contained a bezoar. The gastroduodenal anastomosis was characterized by ulceration. The duodenum was normal.  Gastric biopsy revealed reactive gastropathy with reparative changes and focal scattered neuroendocrine aggregates. There was no H pylori or dysplasia.   She has a history of lung cancerin 2012 s/p right upper loberesection.Low dose chest CTon 09/04/2018 revealed multiplesmall pulmonary nodules are noted in the lungs bilaterally. In addition, therewasa 1.53 cmconcerning nodule in the right lower lobe. There was diffuse bronchial wall thickening with mild centrilobular and paraseptal emphysema; imaging findings suggestive of underlying COPD.   PET scanon 09/16/2018 revealed low level FDG uptake associated with the right lower lobe lung nodule(SUV 1.57). Thiswasa nonspecific  finding. Given the size of this nodule and the low level uptake,findings may reflect inflammatory or infectious nodule. Malignancy wasnot entirely excluded.  Low dose chest CT on 12/04/2018 revealed a 15.3 mm RLL nodule unchanged in size.  Chest CT on  06/03/2019 a 2.0 x 1.7 x 2.0 cm right lower lobe lesion which had characteristics c/w a slow growing neoplasm, strongly suspicious for a primary bronchogenic adenocarcinoma.  There were some ground-glass attenuation components, but is predominantly solid in appearance with some internal air bronchograms, with macrolobulated and spiculated borders, clearly increased in size.   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  Tumor was positive for CK7 and TTF-1 and negative for p40.  She is not a surgical candidate.  She received 6000 cGy SBRT to the RLL from 08/05/2019 - 08/17/2019.  Chest CT without contrast on 11/17/2019 revealed a decrease in the spiculated nodule in the RIGHT lower lobe (20 x 17 mm to 15 x 13 mm).  A serpiginous tubular well-defined structure extended from the lesion towards the pleural surface in the RIGHT chest. This may represent the tract from prior biopsy or a small vascular shunt to the pleural surface from the lesion.  Consider follow-up CT with intravenous contrast. The subtle cystic area with surrounding ground-glass attenuation in the LEFT lower lobe measured approximately 7 mm. Developing neoplasm in this location was considered though this area was unchanged since the prior study. There was severe pulmonary emphysema and very small pericardial effusion. The cysts in the upper pole the LEFT kidney were incompletely imaged.   She has iron deficiency anemia.She received Ferahemex2(08/12/2017- 08/19/2017), 08/20/2018, 11/18/2018, 02/18/2019, 06/30/2019, and 10/22/2019.Oral ironhas been ineffective in the past.  Ferritin has been followed: 9 on 05/07/2017, 14 on  07/15/2017, 66 on 11/07/2017, 12 on 02/12/2018, 7 on 08/13/2018, 9 on 11/12/2018, 14 on 02/17/2019, 15 on 05/18/2019, 12 on 06/25/2019, 44 on 09/02/2019, 22 on 10/15/2019, and 122 on 11/25/2019.  Patient has chronic hypogammaglobulinemia. SPEPrevealed no monoclonal protein. If issues with infections, consider immunology referral.  Symptomatically, she is feeling tired.  Denies any shortness of breath, chest pain or recent infection.  Exam is stable.  Plan: 1.   Review labs from 05/17/2020 2.   Stage I RLL lung cancer  She iss/p RUL lobectomy 2012. Low dose chest CT on 09/04/2018 revealedmultiplesmall pulmonary nodules bilaterallyanda 1.53 cmright lower lobe nodule. PET scan on 09/16/2018 revealedlow level FDG uptake associated with the right lower lobe lung nodule(SUV 1.57).             Chest CT on 06/03/2019 a slowly growing right lower lobe nodule, biopsy + for adenocarcinoma on 06/17/2019.  She was not a surgical candidate.  She received 6000 cGy SBRT to the RLL from 08/05/2019 - 08/17/2019.  Chest CT on 05/26/2020 showed progressive and extensive radiation changes surrounding the right lower lobe pulmonary lesion.  Difficult to identify and measure the lesion in the middle lobe radiation changes.  No mediastinal or hilar mass or adenopathy.  Stable 7 mm left lower lobe nodule.  Recommend continued surveillance.  Plan for next CT chest with contrast in approximately 3-6 months. 3. Iron deficiency  Labs from 05/17/2020 showed ferritin of 40, iron saturation 13% and a hemoglobin of 13.1. She last received IV Feraheme on 02/29/20.   Patient receives Feraheme if ferritin < 30.  She feels weak with significant fatigue.  Proceed with IV iron x2.  Disposition: IV Feraheme x2 doses given 1 week apart. CT chest in 3 months. RTC in 3 months for MD assessment, labs (CBC with diff, ferritin, iron studies-day before), review of chest CT, and  +/- Feraheme.  Addendum: Spoke with Dr. Patsey Berthold regarding recent CT scan.  She is in  agreement with 28-monthfollow-up.  I discussed the assessment and treatment plan with the patient.  The patient was provided an opportunity to ask questions and all were answered.  The patient agreed with the plan and demonstrated an understanding of the instructions.  The patient was advised to call back if the symptoms worsen or if the condition fails to improve as anticipated.  Greater than 50% was spent in counseling and coordination of care with this patient including but not limited to discussion of the relevant topics above (See A&P) including, but not limited to diagnosis and management of acute and chronic medical conditions.   JFaythe Casa NP 05/30/2020 1:17 PM

## 2020-05-26 ENCOUNTER — Ambulatory Visit
Admission: RE | Admit: 2020-05-26 | Discharge: 2020-05-26 | Disposition: A | Discharge status: Home / Self Care | Payer: Medicare Other | Source: Ambulatory Visit | Attending: Hematology and Oncology | Admitting: Hematology and Oncology

## 2020-05-26 ENCOUNTER — Other Ambulatory Visit: Payer: Self-pay

## 2020-05-26 DIAGNOSIS — C3431 Malignant neoplasm of lower lobe, right bronchus or lung: Secondary | ICD-10-CM | POA: Diagnosis not present

## 2020-05-26 LAB — POCT I-STAT CREATININE: Creatinine, Ser: 1 mg/dL (ref 0.44–1.00)

## 2020-05-26 MED ORDER — IOHEXOL 300 MG/ML  SOLN
75.0000 mL | Freq: Once | INTRAMUSCULAR | Status: AC | PRN
  Administered 2020-05-26: 75 mL via INTRAVENOUS

## 2020-05-30 ENCOUNTER — Other Ambulatory Visit: Payer: Self-pay

## 2020-05-30 ENCOUNTER — Inpatient Hospital Stay: Payer: Medicare Other | Attending: Oncology | Admitting: Oncology

## 2020-05-30 ENCOUNTER — Encounter: Payer: Self-pay | Admitting: Oncology

## 2020-05-30 ENCOUNTER — Inpatient Hospital Stay: Payer: Medicare Other

## 2020-05-30 VITALS — BP 132/84 | HR 76

## 2020-05-30 VITALS — BP 132/72 | HR 72 | Temp 96.7°F | Resp 18 | Wt 90.9 lb

## 2020-05-30 DIAGNOSIS — D509 Iron deficiency anemia, unspecified: Secondary | ICD-10-CM

## 2020-05-30 DIAGNOSIS — C3431 Malignant neoplasm of lower lobe, right bronchus or lung: Secondary | ICD-10-CM

## 2020-05-30 MED ORDER — SODIUM CHLORIDE 0.9 % IV SOLN
Freq: Once | INTRAVENOUS | Status: AC
Start: 2020-05-30 — End: 2020-05-30
  Filled 2020-05-30: qty 250

## 2020-05-30 MED ORDER — SODIUM CHLORIDE 0.9 % IV SOLN
510.0000 mg | Freq: Once | INTRAVENOUS | Status: AC
  Administered 2020-05-30: 510 mg via INTRAVENOUS
  Filled 2020-05-30: qty 17

## 2020-05-30 NOTE — Progress Notes (Signed)
Patient here for oncology follow-up appointment, expresses no new complaints or concerns at this time.

## 2020-05-30 NOTE — Patient Instructions (Signed)
[facility]  Discharge Instructions: Thank you for choosing [facility] to provide your oncology and hematology care.  If you have a lab appointment with the [facility], please go directly to the [facility] and check in at the registration area.  Wear comfortable clothing and clothing appropriate for easy access to any Portacath or PICC line.   We strive to give you quality time with your provider. You may need to reschedule your appointment if you arrive late (15 or more minutes).  Arriving late affects you and other patients whose appointments are after yours.  Also, if you miss three or more appointments without notifying the office, you may be dismissed from the clinic at the provider's discretion.      For prescription refill requests, have your pharmacy contact our office and allow 72 hours for refills to be completed.    Today you received the following chemotherapy and/or immunotherapy agents      To help prevent nausea and vomiting after your treatment, we encourage you to take your nausea medication as directed.  BELOW ARE SYMPTOMS THAT SHOULD BE REPORTED IMMEDIATELY: . *FEVER GREATER THAN 100.4 F (38 C) OR HIGHER . *CHILLS OR SWEATING . *NAUSEA AND VOMITING THAT IS NOT CONTROLLED WITH YOUR NAUSEA MEDICATION . *UNUSUAL SHORTNESS OF BREATH . *UNUSUAL BRUISING OR BLEEDING . *URINARY PROBLEMS (pain or burning when urinating, or frequent urination) . *BOWEL PROBLEMS (unusual diarrhea, constipation, pain near the anus) . TENDERNESS IN MOUTH AND THROAT WITH OR WITHOUT PRESENCE OF ULCERS (sore throat, sores in mouth, or a toothache) . UNUSUAL RASH, SWELLING OR PAIN  . UNUSUAL VAGINAL DISCHARGE OR ITCHING   Items with * indicate a potential emergency and should be followed up as soon as possible or go to the Emergency Department if any problems should occur.  Please show the CHEMOTHERAPY ALERT CARD or IMMUNOTHERAPY ALERT CARD at check-in  to the Emergency Department and triage nurse.  Should you have questions after your visit or need to cancel or reschedule your appointment, please contact [facility]  [phone] and follow the prompts.  Office hours are 8:00 a.m. to 4:30 p.m. Monday - Friday. Please note that voicemails left after 4:00 p.m. may not be returned until the following business day.  We are closed weekends and major holidays. You have access to a nurse at all times for urgent questions. Please call the main number to the clinic [phone] and follow the prompts.  For any non-urgent questions, you may also contact your provider using MyChart. We now offer e-Visits for anyone 58 and older to request care online for non-urgent symptoms. For details visit [email or web address].   Also download the MyChart app! Go to the app store, search "MyChart", open the app, select [facility], and log in with your MyChart username and password.  Due to Covid, a mask is required upon entering the hospital/clinic. If you do not have a mask, one will be given to you upon arrival. For doctor visits, patients may have 1 support person aged 45 or older with them. For treatment visits, patients cannot have anyone with them due to current Covid guidelines and our immunocompromised population.

## 2020-05-31 ENCOUNTER — Encounter: Payer: Self-pay | Admitting: Oncology

## 2020-05-31 NOTE — Progress Notes (Signed)
I saw her yesterday and discussed CT scan with her.   Faythe Casa, NP 05/31/2020 12:32 PM

## 2020-06-06 ENCOUNTER — Inpatient Hospital Stay: Payer: Medicare Other

## 2020-06-06 ENCOUNTER — Other Ambulatory Visit: Payer: Self-pay

## 2020-06-06 VITALS — BP 130/82 | HR 87 | Temp 98.0°F | Resp 18

## 2020-06-06 DIAGNOSIS — D509 Iron deficiency anemia, unspecified: Secondary | ICD-10-CM

## 2020-06-06 MED ORDER — SODIUM CHLORIDE 0.9 % IV SOLN
510.0000 mg | Freq: Once | INTRAVENOUS | Status: AC
  Administered 2020-06-06: 510 mg via INTRAVENOUS
  Filled 2020-06-06: qty 17

## 2020-06-06 MED ORDER — SODIUM CHLORIDE 0.9 % IV SOLN
Freq: Once | INTRAVENOUS | Status: AC
  Filled 2020-06-06: qty 250

## 2020-06-13 ENCOUNTER — Ambulatory Visit (INDEPENDENT_AMBULATORY_CARE_PROVIDER_SITE_OTHER): Payer: Medicare Other

## 2020-06-13 ENCOUNTER — Ambulatory Visit (INDEPENDENT_AMBULATORY_CARE_PROVIDER_SITE_OTHER)
Admit: 2020-06-13 | Discharge: 2020-06-13 | Disposition: A | Discharge status: Home / Self Care | Payer: Medicare Other | Attending: Emergency Medicine | Admitting: Emergency Medicine

## 2020-06-13 ENCOUNTER — Ambulatory Visit
Admission: EM | Admit: 2020-06-13 | Discharge: 2020-06-13 | Disposition: A | Discharge status: Home / Self Care | Payer: Medicare Other | Attending: Emergency Medicine | Admitting: Emergency Medicine

## 2020-06-13 DIAGNOSIS — R9389 Abnormal findings on diagnostic imaging of other specified body structures: Secondary | ICD-10-CM | POA: Diagnosis not present

## 2020-06-13 DIAGNOSIS — R0782 Intercostal pain: Secondary | ICD-10-CM

## 2020-06-13 DIAGNOSIS — M62830 Muscle spasm of back: Secondary | ICD-10-CM

## 2020-06-13 DIAGNOSIS — R059 Cough, unspecified: Secondary | ICD-10-CM | POA: Diagnosis not present

## 2020-06-13 MED ORDER — BACLOFEN 10 MG PO TABS: 10.0000 mg | ORAL_TABLET | Freq: Three times a day (TID) | ORAL | 0 refills | Status: DC

## 2020-06-13 NOTE — ED Triage Notes (Addendum)
Pt c/o upper right back/shoulder pain with coughing and sneezing. Pt states this has been going on for several days. Pt reports she is constantly coughing and sneezing d/t allergies, this is nothing new. Pt also has pain with movement of her right arm/shoulder. Pt has taken Norco and this has not helped the pain at all.

## 2020-06-13 NOTE — ED Notes (Signed)
Per [employer] pt's plan does not require authorization for CT.

## 2020-06-13 NOTE — Discharge Instructions (Addendum)
Use the baclofen, 1/2 to 1 tablet 3 times a day, to help with back pain and spasm.  Continue with the Norco that you are currently prescribed for your pain issues.  Follow-up with your primary care provider for any new or worsening symptoms.

## 2020-06-13 NOTE — ED Provider Notes (Signed)
[facility]    CSN: [id number] Arrival date & time: 06/13/20  1328      History   Chief Complaint Chief Complaint  Patient presents with  . Back Pain    Upper right    HPI Carol Shaffer is a 75 y.o. female.   HPI   75 year old female here for evaluation of right upper back pain.  Patient reports that she has been experiencing pain in her right upper back for the last 3 days.  She is unaware of any injury she does reports that she woke up with pain 1 day.  She does have allergies and she sneezes frequently as well as has been having some coughing.  She does take Clarinex daily for allergies.  She denies shortness of breath or other known injury.  Past Medical History:  Diagnosis Date  . Arthritis    hands  . Avascular necrosis of hip, right ([place])   . Blepharospasm   . Cancer [facility]) 2011   Right Upper Lobe Lobectomy  . Chronic diarrhea   . Chronic diarrhea   . Collagenous colitis   . COPD (chronic obstructive pulmonary disease) ([place])   . Depression   . Diverticulosis   . Gastric outlet obstruction   . Headache    every couple of days  . Hemorrhoid   . History of Crohn's disease   . Hypoglycemic disorder   . IDA (iron deficiency anemia)   . Intestinal adhesions   . Multiple gastric ulcers   . Multiple gastric ulcers   . 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[place])   . Stroke [facility]) 8 yrs ago   double vision left eye    Patient Active Problem List   Diagnosis Date Noted  . Primary cancer of right lower lobe of lung ([place]) 06/29/2019  . Problems with swallowing and mastication   . History of lung cancer 09/23/2018  . Right lower lobe pulmonary nodule 09/23/2018  . 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 Weight loss, unintentional 09/17/2018  . Chronic diarrhea 09/11/2018  . Generalized abdominal pain 09/11/2018  . 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 Abdominal pain, LUQ 08/13/2018  .  Abnormal computed tomography of small intestine 08/13/2018  . Positive fecal occult blood test 11/17/2017  . Iron deficiency anemia 08/08/2017  . Collagenous colitis 05/07/2017  . 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/07/2017  . Nausea and vomiting 07/03/2016  . Gastric outlet obstruction 11/11/2013  . Lung cancer ([place]) 12/08/2010    Past Surgical History:  Procedure Laterality Date  . APPENDECTOMY  1989  . botox injections for blepharospasm    . BREAST SURGERY    . CATARACT EXTRACTION    . COLON RESECTION  2005  . COLONOSCOPY  10-29-2008   [name]  . COLONOSCOPY WITH PROPOFOL N/A 03/05/2017   Procedure: COLONOSCOPY WITH PROPOFOL;  Surgeon: Toledo, Benay Pike, MD;  Location: [facility] ENDOSCOPY;  Service: Gastroenterology;  Laterality: N/A;  . COLOSTOMY REVERSAL  2006  . ESOPHAGOGASTRODUODENOSCOPY (EGD) WITH PROPOFOL N/A 07/18/2016   Procedure: ESOPHAGOGASTRODUODENOSCOPY (EGD) WITH PROPOFOL;  Surgeon: Robert Bellow, MD;  Location: [facility] ENDOSCOPY;  Service: Endoscopy;  Laterality: N/A;  . ESOPHAGOGASTRODUODENOSCOPY (EGD) WITH PROPOFOL N/A 08/03/2016   Procedure: ESOPHAGOGASTRODUODENOSCOPY (EGD) WITH PROPOFOL;  Surgeon: Robert Bellow, MD;  Location: [facility] ENDOSCOPY;  Service: Endoscopy;  Laterality: N/A;  . ESOPHAGOGASTRODUODENOSCOPY (EGD) WITH PROPOFOL N/A 09/01/2018   Procedure: ESOPHAGOGASTRODUODENOSCOPY (EGD) WITH PROPOFOL;  Surgeon: Toledo, Benay Pike, MD;  Location: [facility] ENDOSCOPY;  Service: Gastroenterology;  Laterality: N/A;  . ESOPHAGOGASTRODUODENOSCOPY (EGD) WITH PROPOFOL N/A 12/01/2018   Procedure: ESOPHAGOGASTRODUODENOSCOPY (EGD) WITH PROPOFOL;  Surgeon: Toledo, Benay Pike, MD;  Location: [facility] ENDOSCOPY;  Service: Gastroenterology;  Laterality: N/A;  . ESOPHAGOGASTRODUODENOSCOPY (EGD) WITH PROPOFOL N/A 02/23/2019   Procedure: ESOPHAGOGASTRODUODENOSCOPY (EGD) WITH PROPOFOL;  Surgeon: Lucilla Lame, MD;  Location: [place];  Service: Endoscopy;  Laterality: N/A;  . ESOPHAGOSCOPY  WITH DILITATION  2012,2015   [facility] [place]  . EYE SURGERY    . GASTRECTOMY    . gastric ulcer  1989, 1991  . JOINT REPLACEMENT     right total hip arthroplasty 03/03/02  . LAPAROSCOPIC LYSIS OF ADHESIONS    . LUNG CANCER SURGERY  2011  . LYSIS OF ADHESION    . PLACEMENT OF BREAST IMPLANTS  1973  . thoracotomy with right upper lobectomy and central compartment node dissection    . UPPER GASTROINTESTINAL ENDOSCOPY  10-29-2008   [name]    OB History    Gravida  2   Para  2   Term      Preterm      AB      Living        SAB      IAB      Ectopic      Multiple      Live Births           Obstetric Comments  1st Menstrual Cycle:  ? 1st Pregnancy:  23         Home Medications    Prior to Admission medications   Medication Sig Start Date End Date Taking? Authorizing Provider  acetaminophen (TYLENOL) 500 MG tablet Take 500 mg by mouth every 6 (six) hours as needed.   Yes [provider]  baclofen (LIORESAL) 10 MG tablet Take 1 tablet (10 mg total) by mouth 3 (three) times daily. 06/13/20  Yes Margarette Canada, NP  brimonidine-timolol (COMBIGAN) 0.2-0.5 % ophthalmic solution INT 1 GTT IN OU BID 10/29/17  Yes [provider]  HYDROcodone-acetaminophen (NORCO/VICODIN) 5-325 MG per tablet Take 1 tablet by mouth every 6 (six) hours as needed.  08/17/13  Yes [provider]  loratadine (CLARITIN) 10 MG tablet Take 10 mg by mouth daily.   Yes [provider]  Multiple Vitamin (MULTI-VITAMINS) TABS Take 1 tablet by mouth daily. once daily.   Yes [provider]  pantoprazole (PROTONIX) 40 MG tablet Take 40 mg by mouth 2 (two) times daily.  10/28/13  Yes [provider]  sucralfate (CARAFATE) 1 G tablet Take 1 g by mouth 4 (four) times daily.  10/28/13  Yes [provider]  traZODone (DESYREL) 150 MG tablet Take 300 mg by mouth at bedtime.  10/28/13  Yes [provider]  venlafaxine XR (EFFEXOR-XR) 150 MG 24 hr  capsule Take 150 mg by mouth daily with breakfast.  10/28/13  Yes [provider]    Family History History reviewed. No pertinent family history.  Social History Social History   Tobacco Use  . Smoking status: Former Smoker    Packs/day: 1.00    Years: 25.00    Pack years: 25.00    Types: Cigarettes    Quit date: 01/30/2008    Years since quitting: 12.3  . Smokeless tobacco: Never Used  Vaping Use  . Vaping Use: Never used  Substance Use Topics  . Alcohol use: Yes    Comment: wine once per month  . Drug use: No  Allergies   Hydromorphone hcl, Bentyl [dicyclomine], Biaxin [clarithromycin], Erythromycin, and Reglan [metoclopramide]   Review of Systems Review of Systems  Constitutional: Negative for activity change, appetite change and fever.  HENT: Positive for sneezing.   Respiratory: Positive for cough. Negative for shortness of breath and wheezing.   Cardiovascular: Negative for chest pain.  Musculoskeletal: Positive for back pain.  Skin: Negative for color change.  Hematological: Negative.   Psychiatric/Behavioral: Negative.      Physical Exam Triage Vital Signs ED Triage Vitals  Enc Vitals Group     BP 06/13/20 1405 120/69     Pulse Rate 06/13/20 1405 88     Resp 06/13/20 1405 18     Temp 06/13/20 1405 98.5 F (36.9 C)     Temp Source 06/13/20 1405 Oral     SpO2 06/13/20 1405 95 %     Weight 06/13/20 1404 88 lb (39.9 kg)     Height 06/13/20 1404 5' (1.524 m)     Head Circumference --      Peak Flow --      Pain Score 06/13/20 1404 9     Pain Loc --      Pain Edu? --      Excl. in [place]? --    No data found.  Updated Vital Signs BP 120/69 (BP Location: Left Arm)   Pulse 88   Temp 98.5 F (36.9 C) (Oral)   Resp 18   Ht 5' (1.524 m)   Wt 88 lb (39.9 kg)   SpO2 95%   BMI 17.19 kg/m   Visual Acuity Right Eye Distance:   Left Eye Distance:   Bilateral Distance:    Right Eye Near:   Left Eye Near:    Bilateral Near:      Physical Exam Vitals and nursing note reviewed.  Constitutional:      General: She is not in acute distress.    Appearance: Normal appearance. She is normal weight. She is not ill-appearing.  HENT:     Head: Normocephalic and atraumatic.  Cardiovascular:     Rate and Rhythm: Normal rate and regular rhythm.     Pulses: Normal pulses.     Heart sounds: Normal heart sounds. No murmur heard. No gallop.   Pulmonary:     Effort: Pulmonary effort is normal.     Breath sounds: Normal breath sounds. No wheezing, rhonchi or rales.  Skin:    General: Skin is warm and dry.     Capillary Refill: Capillary refill takes less than 2 seconds.  Neurological:     General: No focal deficit present.     Mental Status: She is alert and oriented to person, place, and time.  Psychiatric:        Mood and Affect: Mood normal.        Behavior: Behavior normal.        Thought Content: Thought content normal.        Judgment: Judgment normal.      UC Treatments / Results  Labs (all labs ordered are listed, but only abnormal results are displayed) Labs Reviewed - No data to display  EKG   Radiology DG Ribs Unilateral W/Chest Right  Result Date: 06/13/2020 CLINICAL DATA:  Posterior right rib pain for 3 days, coughing and sneezing. EXAM: RIGHT RIBS AND CHEST - 3+ VIEW COMPARISON:  Chest radiograph 06/17/2019 and CT chest 05/26/2020. FINDINGS: Trachea is midline. Heart size normal. Lungs are emphysematous. Postoperative and post treatment changes in the  right perihilar region. There may be a new cavitary lesion in the peripheral left midlung zone. Lungs are otherwise grossly clear although partially obscured by calcified breast implants. No pleural fluid. Dedicated views of the right ribs show no fracture. IMPRESSION: 1. Possible developing cavitary lesion in the left midlung zone, partially obscured by a calcified left breast implant. Consider CT chest without contrast in further evaluation, as  clinically indicated. 2. No definite rib fracture. 3. Postoperative and post treatment changes in the right perihilar region. Electronically Signed   By: Lorin Picket M.D.   On: 06/13/2020 15:20   CT Chest Wo Contrast  Result Date: 06/13/2020 CLINICAL DATA:  Abnormal x-ray, history of lung nodule in a 75 year old female. Recent respiratory symptoms with coughing and sneezing with RIGHT upper back and shoulder pain. History of prior RIGHT upper lobe resection in 2012 found to have RIGHT lower lobe lung nodule post radiation therapy EXAM: CT CHEST WITHOUT CONTRAST TECHNIQUE: Multidetector CT imaging of the chest was performed following the standard protocol without IV contrast. COMPARISON:  May 26, 2020 and multiple prior studies dating back to 2021, specifically exam from 11/17/2019 also available for comparison. FINDINGS: Cardiovascular: Calcified atheromatous plaque in the thoracic aorta. Heart size. No substantial pericardial effusion. Central pulmonary vasculature is stable with respect to caliber with postoperative changes about the RIGHT hilum similar to the prior study. Limited assessment of cardiovascular structures given lack of intravenous contrast. Mediastinum/Nodes: Esophagus grossly no mediastinal, axillary or hilar lymphadenopathy. Lungs/Pleura: Soft tissue extends from the RIGHT hilum into the peripheral RIGHT chest with associated pleural thickening. There is considerable increase in consolidative changes that track from the RIGHT hilum, surrounding the previous site of pulmonary nodule into the peripheral chest. Postoperative changes of RIGHT upper lobectomy are similar to the prior study. Small nodule in the RIGHT lower lobe on image 86 of series 3 measuring 5 mm is unchanged. Bandlike scarring in the RIGHT lower lobe extending inferiorly also unchanged. Signs of pulmonary emphysema as before. Airways are patent in the chest. No possible trace effusion adjacent to post treatment changes in  the RIGHT mid chest best seen on image 51 of series 2. Upper Abdomen: Incidental imaging of upper abdominal contents without acute process. Spinal degenerative changes Musculoskeletal: No acute bone finding. No destructive bone process. Peripherally calcified breast implants as before. IMPRESSION: 1. Increasing consolidative changes that track from the RIGHT hilum, surrounding the previous site of pulmonary nodule into the peripheral RIGHT chest. Findings are very likely related to post treatment/radiation changes with worsening pneumonitis and reactive changes of the adjacent pleura. Correlate with any clinical signs of infection. 2. Stable small nodule in the RIGHT lower lobe and bandlike scarring. 3. Signs of pulmonary emphysema as before. 4. Emphysema and aortic atherosclerosis. Aortic Atherosclerosis (ICD10-I70.0) and Emphysema (ICD10-J43.9). Electronically Signed   By: Zetta Bills M.D.   On: 06/13/2020 16:50    Procedures Procedures (including critical care time)  Medications Ordered in UC Medications - No data to display  Initial Impression / Assessment and Plan / UC Course  I have reviewed the triage vital signs and the nursing notes.  Pertinent labs & imaging results that were available during my care of the patient were reviewed by me and considered in my medical decision making (see chart for details).   Patient is a very pleasant 75 year old female here for evaluation of pain in her right upper back that is been going for last 3 days.  Patient cannot think of any particular  injury or event that precipitated the development of pain she reports that 1 day she woke up feeling like that.  She states that it hurts to take a deep breath and then hurts when she sneezes or coughs.  She states that she does sneeze frequently due to allergies for which she takes Clarinex.  She denies any shortness of breath.  Patient's physical exam reveals marked tenderness to light palpation to the posterior  upper thorax between the scapula and the spinal column.  Patient is of tender I am unable to determine if this is bony or musculoskeletal pain.  There is no crepitus appreciated on palpation, there is no ecchymosis or erythema overlying the area, and there is no significant spasm of the thoracic wall.  Lung sounds are clear to auscultation in all fields.  Patient takes hydrocodone every 6 hours for chronic abdominal pain secondary to adhesions, blepharospasms, headaches, and arthritis and she reports that the hydrocodone has done nothing to help her pain.  Chest x-ray and right rib films independently reviewed and evaluated by me.  Interpretation: No evidence of broken ribs present on exam.  Awaiting radiology overread.  Radiology overread indicates that there is a developing cavitary lesion in the left midlung near the lingula.  I spoke with the reading radiologist, Dr. Rosario Jacks, who indicated that this is different from the 7 mm lower lobe nodule seen on chest CT dated May 26, 2020 and is most likely infectious.  She is recommending a noncontrast CT of the chest.  Radiology interpretation of chest CT: IMPRESSION: 1. Increasing consolidative changes that track from the RIGHT hilum, surrounding the previous site of pulmonary nodule into the peripheral RIGHT chest. Findings are very likely related to post treatment/radiation changes with worsening pneumonitis and reactive changes of the adjacent pleura. Correlate with any clinical signs of infection. 2. Stable small nodule in the RIGHT lower lobe and bandlike scarring. 3. Signs of pulmonary emphysema as before. 4. Emphysema and aortic atherosclerosis.  CT did not show any signs of the possible forming cavitary lesion as visualized on chest x-ray.  Will treat patient for right-sided back pain with baclofen to help with spasm as this will not increase her sedation and have her continue her Norco as previously prescribed.     Final Clinical  Impressions(s) / UC Diagnoses   Final diagnoses:  Spasm of thoracic back muscle     Discharge Instructions     Use the baclofen, 1/2 to 1 tablet 3 times a day, to help with back pain and spasm.  Continue with the Norco that you are currently prescribed for your pain issues.  Follow-up with your primary care provider for any new or worsening symptoms.    ED Prescriptions    Medication Sig Dispense Auth. Provider   baclofen (LIORESAL) 10 MG tablet Take 1 tablet (10 mg total) by mouth 3 (three) times daily. 19 each Margarette Canada, NP     I have reviewed the PDMP during this encounter.   Margarette Canada, NP 06/13/20 1714

## 2020-08-25 ENCOUNTER — Ambulatory Visit
Admission: RE | Admit: 2020-08-25 | Discharge: 2020-08-25 | Disposition: A | Discharge status: Home / Self Care | Payer: Medicare Other | Source: Ambulatory Visit | Attending: Oncology | Admitting: Oncology

## 2020-08-25 ENCOUNTER — Other Ambulatory Visit: Payer: Self-pay

## 2020-08-25 DIAGNOSIS — C3431 Malignant neoplasm of lower lobe, right bronchus or lung: Secondary | ICD-10-CM | POA: Insufficient documentation

## 2020-08-25 LAB — POCT I-STAT CREATININE: Creatinine, Ser: 0.9 mg/dL (ref 0.44–1.00)

## 2020-08-25 MED ORDER — IOHEXOL 300 MG/ML  SOLN
75.0000 mL | Freq: Once | INTRAMUSCULAR | Status: AC | PRN
  Administered 2020-08-25: 60 mL via INTRAVENOUS

## 2020-08-31 ENCOUNTER — Inpatient Hospital Stay (HOSPITAL_BASED_OUTPATIENT_CLINIC_OR_DEPARTMENT_OTHER): Payer: Medicare Other | Admitting: Oncology

## 2020-08-31 ENCOUNTER — Inpatient Hospital Stay: Payer: Medicare Other | Attending: Oncology

## 2020-08-31 ENCOUNTER — Other Ambulatory Visit: Payer: Self-pay

## 2020-08-31 ENCOUNTER — Encounter: Payer: Self-pay | Admitting: Oncology

## 2020-08-31 ENCOUNTER — Inpatient Hospital Stay: Payer: Medicare Other

## 2020-08-31 VITALS — BP 118/55 | HR 91 | Temp 97.6°F | Resp 14 | Wt 89.0 lb

## 2020-08-31 DIAGNOSIS — Z79899 Other long term (current) drug therapy: Secondary | ICD-10-CM | POA: Diagnosis not present

## 2020-08-31 DIAGNOSIS — Z8673 Personal history of transient ischemic attack (TIA), and cerebral infarction without residual deficits: Secondary | ICD-10-CM | POA: Diagnosis not present

## 2020-08-31 DIAGNOSIS — D509 Iron deficiency anemia, unspecified: Secondary | ICD-10-CM

## 2020-08-31 DIAGNOSIS — Z902 Acquired absence of lung [part of]: Secondary | ICD-10-CM | POA: Insufficient documentation

## 2020-08-31 DIAGNOSIS — C3431 Malignant neoplasm of lower lobe, right bronchus or lung: Secondary | ICD-10-CM

## 2020-08-31 DIAGNOSIS — Z85118 Personal history of other malignant neoplasm of bronchus and lung: Secondary | ICD-10-CM | POA: Insufficient documentation

## 2020-08-31 LAB — CBC WITH DIFFERENTIAL/PLATELET
Abs Immature Granulocytes: 0.04 10*3/uL (ref 0.00–0.07)
Basophils Absolute: 0 10*3/uL (ref 0.0–0.1)
Basophils Relative: 1 %
Eosinophils Absolute: 0.1 10*3/uL (ref 0.0–0.5)
Eosinophils Relative: 2 %
HCT: 41.6 % (ref 36.0–46.0)
Hemoglobin: 13.6 g/dL (ref 12.0–15.0)
Immature Granulocytes: 1 %
Lymphocytes Relative: 15 %
Lymphs Abs: 1.1 10*3/uL (ref 0.7–4.0)
MCH: 30.2 pg (ref 26.0–34.0)
MCHC: 32.7 g/dL (ref 30.0–36.0)
MCV: 92.2 fL (ref 80.0–100.0)
Monocytes Absolute: 0.7 10*3/uL (ref 0.1–1.0)
Monocytes Relative: 10 %
Neutro Abs: 5.3 10*3/uL (ref 1.7–7.7)
Neutrophils Relative %: 71 %
Platelets: 305 10*3/uL (ref 150–400)
RBC: 4.51 MIL/uL (ref 3.87–5.11)
RDW: 14.9 % (ref 11.5–15.5)
WBC: 7.4 10*3/uL (ref 4.0–10.5)
nRBC: 0 % (ref 0.0–0.2)

## 2020-08-31 LAB — IRON AND TIBC
Iron: 45 ug/dL (ref 28–170)
Saturation Ratios: 17 % (ref 10.4–31.8)
TIBC: 259 ug/dL (ref 250–450)
UIBC: 214 ug/dL

## 2020-08-31 LAB — FERRITIN: Ferritin: 235 ng/mL (ref 11–307)

## 2020-08-31 NOTE — Progress Notes (Signed)
Hematology/Oncology Consult note [facility]  [phone] [phone]  Patient Care Team: Albina Billet, MD as PCP - General (Internal Medicine) Marden Noble, MD (Internal Medicine) Bary Castilla, Forest Gleason, MD (General Surgery) Telford Nab, RN as Oncology Nurse Navigator Lequita Asal, MD as Referring Physician (Hematology and Oncology) Noreene Filbert, MD as Referring Physician (Radiation Oncology)   Name of the patient: Carol Shaffer  [id number]  11-01-1945   Date of visit: 08/31/20  Diagnosis-history of stage I lung cancer Iron deficiency anemia  Chief complaint/ Reason for visit-routine follow-up of lung cancer and iron deficiency anemia  Heme/Onc history: Patient is a 75 year old female with a history oflung cancer in 2012 s/p right upper lobe resection.  Low dose chest CT on 09/04/2018 revealed multiple small pulmonary nodules are noted in the lungs bilaterally. In addition, there was a 1.53 cm concerning nodule in the right lower lobe. There was diffuse bronchial wall thickening with mild centrilobular and paraseptal emphysema; imaging findings suggestive of underlying COPD.PET scan on 09/16/2018 revealed low level FDG uptake associated with the right lower lobe lung nodule (SUV 1.57). This was a nonspecific finding. Given the size of this nodule and the low level uptake, findings may reflect inflammatory or infectious nodule. Malignancy was not entirely excluded.  Low dose chest CT on 12/04/2018 revealed a 15.3 mm RLL nodule unchanged in size.   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  There were some ground-glass attenuation components, but is predominantly solid in appearance with some internal air bronchograms, with macrolobulated and spiculated borders, clearly increased in size.    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  Tumor was positive for CK7 and TTF-1 and negative for p40.  She is not a surgical candidate.   She received 6000 cGy SBRT to the RLL from 08/05/2019 - 08/17/2019.   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  History of iron deficiency anemia and she has undergone work-up in the past.  She has a history of gastric bypass surgery.    Interval history-patient currently reports doing well and denies any specific complaints at this time.  Appetite and weight have remained stable  ECOG PS- 1 Pain scale- 0   Review of systems- Review of Systems  Constitutional:  Negative for chills, fever, malaise/fatigue and weight loss.  HENT:  Negative for congestion, ear discharge and nosebleeds.   Eyes:  Negative for blurred vision.  Respiratory:  Negative for cough, hemoptysis, sputum production, shortness of breath and wheezing.   Cardiovascular:  Negative for chest pain, palpitations, orthopnea and claudication.  Gastrointestinal:  Negative for abdominal pain, blood in stool, constipation, diarrhea, heartburn, melena, nausea and vomiting.  Genitourinary:  Negative for dysuria, flank pain, frequency, hematuria and urgency.  Musculoskeletal:  Negative for back pain, joint pain and myalgias.  Skin:  Negative for rash.  Neurological:  Negative for dizziness, tingling, focal weakness, seizures, weakness and headaches.  Endo/Heme/Allergies:  Does not bruise/bleed easily.  Psychiatric/Behavioral:  Negative for depression and suicidal ideas. The patient does not have insomnia.       Allergies  Allergen Reactions   Hydromorphone Hcl Itching   Bentyl [Dicyclomine] Nausea And Vomiting   Biaxin [Clarithromycin] Other (See Comments)    hallucinations   Erythromycin Nausea And Vomiting   Reglan [  Metoclopramide] Other (See Comments)    tremors     Past Medical History:   Diagnosis Date   Arthritis    hands   Avascular necrosis of hip, right ([place])    Blepharospasm    Cancer ([place]) 2011   Right Upper Lobe Lobectomy   Chronic diarrhea    Chronic diarrhea    Collagenous colitis    COPD (chronic obstructive pulmonary disease) (HCC)    Depression    Diverticulosis    Gastric outlet obstruction    Headache    every couple of days   Hemorrhoid    History of Crohn's disease    Hypoglycemic disorder    IDA (iron deficiency anemia)    Intestinal adhesions    Multiple gastric ulcers    Multiple gastric ulcers    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[place])    Stroke ([place]) 8 yrs ago   double vision left eye     Past Surgical History:  Procedure Laterality Date   APPENDECTOMY  1989   botox injections for blepharospasm     BREAST SURGERY     CATARACT EXTRACTION     COLON RESECTION  2005   COLONOSCOPY  10-29-2008   [name]   COLONOSCOPY WITH PROPOFOL N/A 03/05/2017   Procedure: COLONOSCOPY WITH PROPOFOL;  Surgeon: Toledo, Benay Pike, MD;  Location: [facility] ENDOSCOPY;  Service: Gastroenterology;  Laterality: N/A;   COLOSTOMY REVERSAL  2006   ESOPHAGOGASTRODUODENOSCOPY (EGD) WITH PROPOFOL N/A 07/18/2016   Procedure: ESOPHAGOGASTRODUODENOSCOPY (EGD) WITH PROPOFOL;  Surgeon: Robert Bellow, MD;  Location: [facility] ENDOSCOPY;  Service: Endoscopy;  Laterality: N/A;   ESOPHAGOGASTRODUODENOSCOPY (EGD) WITH PROPOFOL N/A 08/03/2016   Procedure: ESOPHAGOGASTRODUODENOSCOPY (EGD) WITH PROPOFOL;  Surgeon: Robert Bellow, MD;  Location: [facility]OSCOPY;  Service: Endoscopy;  Laterality: N/A;   ESOPHAGOGASTRODUODENOSCOPY (EGD) WITH PROPOFOL N/A 09/01/2018   Procedure: ESOPHAGOGASTRODUODENOSCOPY (EGD) WITH PROPOFOL;  Surgeon: Toledo, Benay Pike, MD;  Location: [facility] END[facility];  Service: Gastroenterology;  Laterality: N/A;   ESOPHAGOGASTRODUODENOSCOPY (EGD) WITH PROPOFOL N/A 12/01/2018   Procedure: ESOPHAGOGASTRODUODENOSCOPY (EGD) WITH PROPOFOL;  Surgeon: Toledo, Benay Pike,  MD;  Location: [facility] END[facility];  Service: Gastroenterology;  Laterality: N/A;   ESOPHAGOGASTRODUODENOSCOPY (EGD) WITH PROPOFOL N/A 02/23/2019   Procedure: ESOPHAGOGASTRODUODENOSCOPY (EGD) WITH PROPOFOL;  Surgeon: Lucilla Lame, MD;  Location: [place];  Service: Endoscopy;  Laterality: N/A;   ESOPHAGOSCOPY WITH DILITATION  2012,2015   [facility] [place]   EYE SURGERY     GASTRECTOMY     gastric ulcer  1989, 1991   JOINT REPLACEMENT     right total hip arthroplasty 03/03/02   LAPAROSCOPIC LYSIS OF ADHESIONS     LUNG CANCER SURGERY  2011   LYSIS OF ADHESION     PLACEMENT OF BREAST IMPLANTS  1973   thoracotomy with right upper lobectomy and central compartment node dissection     UPPER GASTROINTESTINAL ENDOSCOPY  10-29-2008   [name]    Social History   Socioeconomic History   Marital status: Widowed    Spouse name: Not on file   Number of children: 2   Years of education: Not on file   Highest education level: Not on file  Occupational History   Occupation: retired    Comment: Pharmacist, hospital  Tobacco Use   Smoking status: Former    Packs/day: 1.00    Years: 25.00    Pack years: 25.00    Types: Cigarettes    Quit date: 01/30/2008    Years since quitting: 12.5   Smokeless tobacco:  Never  Vaping Use   Vaping Use: Never used  Substance and Sexual Activity   Alcohol use: Yes    Comment: wine once per month   Drug use: No   Sexual activity: Not on file  Other Topics Concern   Not on file  Social History Narrative   Lives alone    Social Determinants of Health   Financial Resource Strain: Not on file  Food Insecurity: Not on file  Transportation Needs: Not on file  Physical Activity: Not on file  Stress: Not on file  Social Connections: Not on file  Intimate Partner Violence: Not on file    History reviewed. No pertinent family history.   Current Outpatient Medications:    acetaminophen (TYLENOL) 500 MG tablet, Take 500 mg by mouth every 6 (six) hours as needed.,  Disp: , Rfl:    brimonidine-timolol (COMBIGAN) 0.2-0.5 % ophthalmic solution, INT 1 GTT IN OU BID, Disp: , Rfl:    HYDROcodone-acetaminophen (NORCO/VICODIN) 5-325 MG per tablet, Take 1 tablet by mouth every 6 (six) hours as needed. , Disp: , Rfl:    loratadine (CLARITIN) 10 MG tablet, Take 10 mg by mouth daily., Disp: , Rfl:    Multiple Vitamin (MULTI-VITAMINS) TABS, Take 1 tablet by mouth daily. once daily., Disp: , Rfl:    pantoprazole (PROTONIX) 40 MG tablet, Take 40 mg by mouth 2 (two) times daily. , Disp: , Rfl:    PAXLOVID 20 x 150 MG & 10 x 100MG TBPK, Take by mouth., Disp: , Rfl:    sucralfate (CARAFATE) 1 G tablet, Take 1 g by mouth 4 (four) times daily. , Disp: , Rfl:    traZODone (DESYREL) 150 MG tablet, Take 300 mg by mouth at bedtime. , Disp: , Rfl:    venlafaxine XR (EFFEXOR-XR) 150 MG 24 hr capsule, Take 150 mg by mouth daily with breakfast. , Disp: , Rfl:   Physical exam:  Vitals:   08/31/20 1324  BP: (!) 118/55  Pulse: 91  Resp: 14  Temp: 97.6 F (36.4 C)  SpO2: 96%  Weight: 88 lb 15.3 oz (40.4 kg)   Physical Exam Constitutional:      General: She is not in acute distress. Cardiovascular:     Rate and Rhythm: Normal rate and regular rhythm.     Heart sounds: Normal heart sounds.  Pulmonary:     Effort: Pulmonary effort is normal.     Breath sounds: Normal breath sounds.  Abdominal:     General: Bowel sounds are normal.     Palpations: Abdomen is soft.  Skin:    General: Skin is warm and dry.  Neurological:     Mental Status: She is alert and oriented to person, place, and time.     CMP Latest Ref Rng & Units 08/25/2020  Glucose 70 - 99 mg/dL -  BUN 8 - 23 mg/dL -  Creatinine 0.44 - 1.00 mg/dL 0.90  Sodium 135 - 145 mmol/L -  Potassium 3.5 - 5.1 mmol/L -  Chloride 98 - 111 mmol/L -  CO2 22 - 32 mmol/L -  Calcium 8.9 - 10.3 mg/dL -  Total Protein 6.5 - 8.1 g/dL -  Total Bilirubin 0.3 - 1.2 mg/dL -  Alkaline Phos 38 - 126 U/L -  AST 15 - 41 U/L -  ALT  0 - 44 U/L -   CBC Latest Ref Rng & Units 08/31/2020  WBC 4.0 - 10.5 K/uL 7.4  Hemoglobin 12.0 - 15.0 g/dL 13.6  Hematocrit  36.0 - 46.0 % 41.6  Platelets 150 - 400 K/uL 305    No images are attached to the encounter.  CT Chest W Contrast  Result Date: 08/26/2020 CLINICAL DATA:  Follow-up right lung nodule. Prior right upper lobe resection for lung carcinoma. EXAM: CT CHEST WITH CONTRAST TECHNIQUE: Multidetector CT imaging of the chest was performed during intravenous contrast administration. CONTRAST:  37m OMNIPAQUE IOHEXOL 300 MG/ML  SOLN COMPARISON:  06/13/2020 FINDINGS: Cardiovascular: No acute findings. Aortic and coronary atherosclerotic calcification noted. Mediastinum/Nodes: No masses or pathologically enlarged lymph nodes identified. Lungs/Pleura: Postop changes again seen from prior right upper lobectomy. Poorly defined pulmonary opacity in the right perihilar region and superior right lower lobe is again seen with associated central bronchiectasis. This shows interval improvement in the peripheral right lower lobe. A curvilinear subpleural nodular density in the anterior right lower lobe measures 5 mm on image 86/2, stable since prior exam. No new or enlarging pulmonary nodules or masses are identified. Moderate centrilobular emphysema again noted. No evidence of pleural effusion. Upper Abdomen:  Unremarkable. Bilateral renal cysts again noted. Musculoskeletal:  No suspicious bone lesions. IMPRESSION: Stable ill-defined opacity with central bronchiectasis in right perihilar region, with decreased opacity in peripheral right lower lobe. These findings are most consistent with evolving post radiation changes. Stable 5 mm subpleural nodular density in anterior right lower lobe. No evidence of recurrent or metastatic carcinoma within the thorax. Aortic Atherosclerosis (ICD10-I70.0) and Emphysema (ICD10-J43.9). Electronically Signed   By: JMarlaine HindM.D.   On: 08/26/2020 13:18     Assessment  and plan- Patient is a 75y.o. female who is here for follow-up of following issues:  History of lung cancer initially in 2012 s/p right upper lobectomy then found to have growing right lower lobe biopsy-proven adenocarcinoma in May 2021 s/p SBRT.  I have reviewed her CT chest images independently and discussed findings with the patient.  Radiation changes noted in the right lower lobe.  5 mm stable right lower lobe nodule also noted.  She was previously noted to have a cystic 7 mm left lower lobe nodule which was not seen on present scan.  Plan to repeat CT chest without contrast in 6 months.  Iron deficiency anemia: Last received FerahemeIn May 2022.  Iron studies are presently normal and she is not anemic.  We will repeat CBC ferritin and iron studies in 6 months and I will see her thereafter   Visit Diagnosis 1. Iron deficiency anemia, unspecified iron deficiency anemia type   2. History of lung cancer      Dr. ARanda Evens MD, MPH CRen[facility]at [id number]/03/2020 4:53 PM

## 2021-03-06 ENCOUNTER — Other Ambulatory Visit: Payer: Self-pay | Admitting: *Deleted

## 2021-03-06 DIAGNOSIS — D509 Iron deficiency anemia, unspecified: Secondary | ICD-10-CM

## 2021-03-08 ENCOUNTER — Ambulatory Visit
Admission: RE | Admit: 2021-03-08 | Discharge: 2021-03-08 | Disposition: A | Discharge status: Home / Self Care | Payer: Medicare Other | Source: Ambulatory Visit | Attending: Oncology | Admitting: Oncology

## 2021-03-08 ENCOUNTER — Other Ambulatory Visit: Payer: Medicare Other

## 2021-03-08 ENCOUNTER — Other Ambulatory Visit: Payer: Self-pay

## 2021-03-08 DIAGNOSIS — Z85118 Personal history of other malignant neoplasm of bronchus and lung: Secondary | ICD-10-CM | POA: Diagnosis present

## 2021-03-08 DIAGNOSIS — D509 Iron deficiency anemia, unspecified: Secondary | ICD-10-CM | POA: Diagnosis not present

## 2021-03-13 ENCOUNTER — Other Ambulatory Visit: Payer: Self-pay

## 2021-03-13 ENCOUNTER — Encounter: Payer: Self-pay | Admitting: Oncology

## 2021-03-13 ENCOUNTER — Inpatient Hospital Stay: Payer: Medicare Other

## 2021-03-13 ENCOUNTER — Inpatient Hospital Stay: Payer: Medicare Other | Attending: Oncology | Admitting: Oncology

## 2021-03-13 VITALS — BP 121/82 | HR 87 | Temp 96.8°F | Resp 16 | Ht 60.0 in | Wt 89.0 lb

## 2021-03-13 DIAGNOSIS — Z85118 Personal history of other malignant neoplasm of bronchus and lung: Secondary | ICD-10-CM | POA: Insufficient documentation

## 2021-03-13 DIAGNOSIS — Z87891 Personal history of nicotine dependence: Secondary | ICD-10-CM | POA: Diagnosis present

## 2021-03-13 DIAGNOSIS — R911 Solitary pulmonary nodule: Secondary | ICD-10-CM

## 2021-03-13 DIAGNOSIS — C3411 Malignant neoplasm of upper lobe, right bronchus or lung: Secondary | ICD-10-CM | POA: Diagnosis not present

## 2021-03-13 DIAGNOSIS — D509 Iron deficiency anemia, unspecified: Secondary | ICD-10-CM | POA: Diagnosis not present

## 2021-03-13 LAB — CBC
HCT: 41 % (ref 36.0–46.0)
Hemoglobin: 13 g/dL (ref 12.0–15.0)
MCH: 29.1 pg (ref 26.0–34.0)
MCHC: 31.7 g/dL (ref 30.0–36.0)
MCV: 91.7 fL (ref 80.0–100.0)
Platelets: 298 10*3/uL (ref 150–400)
RBC: 4.47 MIL/uL (ref 3.87–5.11)
RDW: 13.5 % (ref 11.5–15.5)
WBC: 6 10*3/uL (ref 4.0–10.5)
nRBC: 0 % (ref 0.0–0.2)

## 2021-03-13 LAB — IRON AND TIBC
Iron: 42 ug/dL (ref 28–170)
Saturation Ratios: 16 % (ref 10.4–31.8)
TIBC: 260 ug/dL (ref 250–450)
UIBC: 218 ug/dL

## 2021-03-13 LAB — FERRITIN: Ferritin: 71 ng/mL (ref 11–307)

## 2021-03-15 ENCOUNTER — Ambulatory Visit: Payer: Medicare Other | Admitting: Oncology

## 2021-03-18 ENCOUNTER — Encounter: Payer: Self-pay | Admitting: Oncology

## 2021-03-18 NOTE — Progress Notes (Signed)
Hematology/Oncology Consult note [facility]  [phone] [phone]  Patient Care Team: Albina Billet, MD as PCP - General (Internal Medicine) Marden Noble, MD (Internal Medicine) Bary Castilla, Forest Gleason, MD (General Surgery) Telford Nab, RN as Oncology Nurse Navigator Noreene Filbert, MD as Referring Physician (Radiation Oncology) Sindy Guadeloupe, MD as Consulting Physician (Oncology)   Name of the patient: Carol Shaffer  [id number]  16-May-1945   Date of visit: 03/18/21  Diagnosis- history of stage I lung cancer Iron deficiency anemia    Chief complaint/ Reason for visit-routine follow-up of lung cancer and iron deficiency anemia  Heme/Onc history: Patient is a 76 year old female with a history oflung cancer in 2012 s/p right upper lobe resection.  Low dose chest CT on 09/04/2018 revealed multiple small pulmonary nodules are noted in the lungs bilaterally. In addition, there was a 1.53 cm concerning nodule in the right lower lobe. There was diffuse bronchial wall thickening with mild centrilobular and paraseptal emphysema; imaging findings suggestive of underlying COPD.PET scan on 09/16/2018 revealed low level FDG uptake associated with the right lower lobe lung nodule (SUV 1.57). This was a nonspecific finding. Given the size of this nodule and the low level uptake, findings may reflect inflammatory or infectious nodule. Malignancy was not entirely excluded.  Low dose chest CT on 12/04/2018 revealed a 15.3 mm RLL nodule unchanged in size.   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  There were some ground-glass attenuation components, but is predominantly solid in appearance with some internal air bronchograms, with macrolobulated and spiculated borders, clearly increased in size.   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  Tumor was positive for CK7 and TTF-1 and negative for p40.  She is not a surgical candidate.   She received 6000 cGy SBRT to the RLL from 08/05/2019 - 08/17/2019.   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  History of iron deficiency anemia and she has undergone work-up in the past.  She has a history of gastric bypass surgery.    Interval history-patient reports doing well and denies any specific complaints at this time  ECOG PS- 1 Pain scale- 0   Review of systems- Review of Systems  Constitutional:  Negative for chills, fever, malaise/fatigue and weight loss.  HENT:  Negative for congestion, ear discharge and nosebleeds.   Eyes:  Negative for blurred vision.  Respiratory:  Negative for cough, hemoptysis, sputum production, shortness of breath and wheezing.   Cardiovascular:  Negative for chest pain, palpitations, orthopnea and claudication.  Gastrointestinal:  Negative for abdominal pain, blood in stool, constipation, diarrhea, heartburn, melena, nausea and vomiting.  Genitourinary:  Negative for dysuria, flank pain, frequency, hematuria and urgency.  Musculoskeletal:  Negative for back pain, joint pain and myalgias.  Skin:  Negative for rash.  Neurological:  Negative for dizziness, tingling, focal weakness, seizures, weakness and headaches.  Endo/Heme/Allergies:  Does not bruise/bleed easily.  Psychiatric/Behavioral:  Negative for depression and suicidal ideas. The patient does not have insomnia.      Allergies  Allergen Reactions   Hydromorphone Hcl Itching   Bentyl [Dicyclomine] Nausea And Vomiting   Biaxin [Clarithromycin] Other (See Comments)    hallucinations   Erythromycin Nausea And Vomiting   Reglan [Metoclopramide] Other (See Comments)    tremors  Past Medical History:  Diagnosis Date   Arthritis    hands   Avascular necrosis of hip,  right ([place])    Blepharospasm    Cancer ([place]) 2011   Right Upper Lobe Lobectomy   Chronic diarrhea    Chronic diarrhea    Collagenous colitis    COPD (chronic obstructive pulmonary disease) (HCC)    Depression    Diverticulosis    Gastric outlet obstruction    Headache    every couple of days   Hemorrhoid    History of Crohn's disease    Hypoglycemic disorder    IDA (iron deficiency anemia)    Intestinal adhesions    Multiple gastric ulcers    Multiple gastric ulcers    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[place])    Stroke ([place]) 8 yrs ago   double vision left eye     Past Surgical History:  Procedure Laterality Date   APPENDECTOMY  1989   botox injections for blepharospasm     BREAST SURGERY     CATARACT EXTRACTION     COLON RESECTION  2005   COLONOSCOPY  10-29-2008   [name]   COLONOSCOPY WITH PROPOFOL N/A 03/05/2017   Procedure: COLONOSCOPY WITH PROPOFOL;  Surgeon: Toledo, Benay Pike, MD;  Location: [facility];  Service: Gastroenterology;  Laterality: N/A;   COLOSTOMY REVERSAL  2006   ESOPHAGOGASTRODUODENOSCOPY (EGD) WITH PROPOFOL N/A 07/18/2016   Procedure: ESOPHAGOGASTRODUODENOSCOPY (EGD) WITH PROPOFOL;  Surgeon: Robert Bellow, MD;  Location: [facility];  Service: Endoscopy;  Laterality: N/A;   ESOPHAGOGASTRODUODENOSCOPY (EGD) WITH PROPOFOL N/A 08/03/2016   Procedure: ESOPHAGOGASTRODUODENOSCOPY (EGD) WITH PROPOFOL;  Surgeon: Robert Bellow, MD;  Location: [facility];  Service: Endoscopy;  Laterality: N/A;   ESOPHAGOGASTRODUODENOSCOPY (EGD) WITH PROPOFOL N/A 09/01/2018   Procedure: ESOPHAGOGASTRODUODENOSCOPY (EGD) WITH PROPOFOL;  Surgeon: Toledo, Benay Pike, MD;  Location: [facility];  Service: Gastroenterology;  Laterality: N/A;   ESOPHAGOGASTRODUODENOSCOPY (EGD) WITH PROPOFOL N/A 12/01/2018   Procedure: ESOPHAGOGASTRODUODENOSCOPY (EGD) WITH PROPOFOL;  Surgeon: Toledo, Benay Pike, MD;  Location: [facility];  Service: Gastroenterology;  Laterality:  N/A;   ESOPHAGOGASTRODUODENOSCOPY (EGD) WITH PROPOFOL N/A 02/23/2019   Procedure: ESOPHAGOGASTRODUODENOSCOPY (EGD) WITH PROPOFOL;  Surgeon: Lucilla Lame, MD;  Location: [place];  Service: Endoscopy;  Laterality: N/A;   ESOPHAGOSCOPY WITH DILITATION  2012,2015   [facility] [place]   EYE SURGERY     GASTRECTOMY     gastric ulcer  1989, 1991   JOINT REPLACEMENT     right total hip arthroplasty 03/03/02   LAPAROSCOPIC LYSIS OF ADHESIONS     LUNG CANCER SURGERY  2011   LYSIS OF ADHESION     PLACEMENT OF BREAST IMPLANTS  1973   thoracotomy with right upper lobectomy and central compartment node dissection     UPPER GASTROINTESTINAL ENDOSCOPY  10-29-2008   [name]    Social History   Socioeconomic History   Marital status: Widowed    Spouse name: Not on file   Number of children: 2   Years of education: Not on file   Highest education level: Not on file  Occupational History   Occupation: retired    Comment: Pharmacist, hospital  Tobacco Use   Smoking status: Former    Packs/day: 1.00    Years: 25.00    Pack years: 25.00    Types: Cigarettes    Quit date: 01/30/2008    Years since quitting: 13.1   Smokeless tobacco: Never  Vaping Use   Vaping Use: Never used  Substance  and Sexual Activity   Alcohol use: Yes    Comment: wine once per month   Drug use: No   Sexual activity: Not on file  Other Topics Concern   Not on file  Social History Narrative   Lives alone    Social Determinants of Health   Financial Resource Strain: Not on file  Food Insecurity: Not on file  Transportation Needs: Not on file  Physical Activity: Not on file  Stress: Not on file  Social Connections: Not on file  Intimate Partner Violence: Not on file    History reviewed. No pertinent family history.   Current Outpatient Medications:    acetaminophen (TYLENOL) 500 MG tablet, Take 500 mg by mouth every 6 (six) hours as needed., Disp: , Rfl:    brimonidine-timolol (COMBIGAN) 0.2-0.5 % ophthalmic  solution, INT 1 GTT IN OU BID, Disp: , Rfl:    HYDROcodone-acetaminophen (NORCO/VICODIN) 5-325 MG per tablet, Take 1 tablet by mouth every 6 (six) hours as needed. , Disp: , Rfl:    loratadine (CLARITIN) 10 MG tablet, Take 10 mg by mouth daily., Disp: , Rfl:    Multiple Vitamin (MULTI-VITAMINS) TABS, Take 1 tablet by mouth daily. once daily., Disp: , Rfl:    pantoprazole (PROTONIX) 40 MG tablet, Take 40 mg by mouth 2 (two) times daily. , Disp: , Rfl:    sucralfate (CARAFATE) 1 G tablet, Take 1 g by mouth 4 (four) times daily. , Disp: , Rfl:    traZODone (DESYREL) 150 MG tablet, Take 300 mg by mouth at bedtime. , Disp: , Rfl:    venlafaxine XR (EFFEXOR-XR) 150 MG 24 hr capsule, Take 150 mg by mouth daily with breakfast. , Disp: , Rfl:   Physical exam:  Vitals:   03/13/21 1332  BP: 121/82  Pulse: 87  Resp: 16  Temp: (!) 96.8 F (36 C)  TempSrc: Tympanic  SpO2: 95%  Weight: 89 lb (40.4 kg)  Height: 5' (1.524 m)   Physical Exam Constitutional:      General: She is not in acute distress. Cardiovascular:     Rate and Rhythm: Normal rate and regular rhythm.     Heart sounds: Normal heart sounds.  Pulmonary:     Effort: Pulmonary effort is normal.     Breath sounds: Normal breath sounds.  Abdominal:     General: Bowel sounds are normal.     Palpations: Abdomen is soft.  Skin:    General: Skin is warm and dry.  Neurological:     Mental Status: She is alert and oriented to person, place, and time.     CMP Latest Ref Rng & Units 08/25/2020  Glucose 70 - 99 mg/dL -  BUN 8 - 23 mg/dL -  Creatinine 0.44 - 1.00 mg/dL 0.90  Sodium 135 - 145 mmol/L -  Potassium 3.5 - 5.1 mmol/L -  Chloride 98 - 111 mmol/L -  CO2 22 - 32 mmol/L -  Calcium 8.9 - 10.3 mg/dL -  Total Protein 6.5 - 8.1 g/dL -  Total Bilirubin 0.3 - 1.2 mg/dL -  Alkaline Phos 38 - 126 U/L -  AST 15 - 41 U/L -  ALT 0 - 44 U/L -   CBC Latest Ref Rng & Units 03/13/2021  WBC 4.0 - 10.5 K/uL 6.0  Hemoglobin 12.0 - 15.0  g/dL 13.0  Hematocrit 36.0 - 46.0 % 41.0  Platelets 150 - 400 K/uL 298    No images are attached to the encounter.  CT Chest  Wo Contrast  Result Date: 03/09/2021 CLINICAL DATA:  Lung cancer restaging. Remote history of right upper lobe cancer in 2012. More recently with biopsy-proven non-small cell carcinoma of the right lower lobe, status post SBRT 08/17/2019. EXAM: CT CHEST WITHOUT CONTRAST TECHNIQUE: Multidetector CT imaging of the chest was performed following the standard protocol without IV contrast. RADIATION DOSE REDUCTION: This exam was performed according to the departmental dose-optimization program which includes automated exposure control, adjustment of the mA and/or kV according to patient size and/or use of iterative reconstruction technique. COMPARISON:  08/25/2020 FINDINGS: Cardiovascular: The heart size is normal. No substantial pericardial effusion. Coronary artery calcification is evident. Mild atherosclerotic calcification is noted in the wall of the thoracic aorta. Mediastinum/Nodes: No mediastinal lymphadenopathy. No evidence for gross left hilar lymphadenopathy although assessment is limited by the lack of intravenous contrast on the current study. Soft tissue fullness in the right hilum is not well evaluated on today's noncontrast study but overall appearance is similar to prior study. The esophagus has normal imaging features. There is no axillary lymphadenopathy. Lungs/Pleura: Centrilobular and paraseptal emphysema evident. Volume loss right hemithorax is stable consistent with prior remote lung resection. Consolidative focal masslike opacity in the right lower lobe is seen in the region of the pulmonary lesion noted on previous CT of 06/03/2019 and today is consistent with post treatment scarring, stable since 08/25/2020. Previously characterized curvilinear subpleural nodule anterior right lower lobe is similar today at 6 mm (84/2) 8 mm sub solid nodule posterior left lower lobe  on 94/2 is stable. There is no evidence of pleural effusion. Upper Abdomen: Multiple renal cysts noted bilaterally. Musculoskeletal: No worrisome lytic or sclerotic osseous abnormality. IMPRESSION: 1. Stable exam. No new or progressive interval findings. 2. Stable post treatment scarring in the right lower lobe. 3. Stable bilateral pulmonary nodules. Continued attention on follow-up recommended. 4. Aortic Atherosclerosis (ICD10-I70.0) and Emphysema (ICD10-J43.9). Electronically Signed   By: Misty Stanley M.D.   On: 03/09/2021 08:45     Assessment and plan- Patient is a 76 y.o. female for follow-up of following issues:  History of lung cancer: Patient recently had a CT chest without contrast on 03/08/2021 which did not show any evidence of progressive or recurrent disease.  Plan was to get surveillance CT scans every 6 months up to 3 years followed by yearly scans.  I will therefore see her back in 6 months with a repeat CT chest.   History of iron deficiency anemia: Patient is not presently anemic with H&H of 13/41.  Ferritin levels are presently normal at 71 with an iron saturation of 16%.  She does not require any IV iron at this time.  Repeat CBC ferritin and iron studies in 3 in 6 months and I will see her back in 6 months   Visit Diagnosis 1. History of lung cancer   2. Right lower lobe pulmonary nodule   3. Malignant neoplasm of upper lobe of right lung [facility])      Dr. Randa Evens, MD, MPH [facility] at [facility] [phone] 03/18/2021 7:02 PM

## 2021-09-13 ENCOUNTER — Ambulatory Visit
Admission: RE | Admit: 2021-09-13 | Discharge: 2021-09-13 | Disposition: A | Discharge status: Home / Self Care | Payer: Medicare Other | Source: Ambulatory Visit | Attending: Oncology | Admitting: Oncology

## 2021-09-13 DIAGNOSIS — R911 Solitary pulmonary nodule: Secondary | ICD-10-CM | POA: Diagnosis present

## 2021-09-13 DIAGNOSIS — Z85118 Personal history of other malignant neoplasm of bronchus and lung: Secondary | ICD-10-CM | POA: Diagnosis present

## 2021-09-13 DIAGNOSIS — C3411 Malignant neoplasm of upper lobe, right bronchus or lung: Secondary | ICD-10-CM | POA: Diagnosis present

## 2021-09-15 ENCOUNTER — Inpatient Hospital Stay (HOSPITAL_BASED_OUTPATIENT_CLINIC_OR_DEPARTMENT_OTHER): Payer: Medicare Other | Admitting: Nurse Practitioner

## 2021-09-15 ENCOUNTER — Encounter: Payer: Self-pay | Admitting: Nurse Practitioner

## 2021-09-15 ENCOUNTER — Inpatient Hospital Stay: Payer: Medicare Other | Attending: Oncology

## 2021-09-15 VITALS — BP 135/71 | HR 90 | Temp 97.5°F | Resp 14 | Wt 80.7 lb

## 2021-09-15 DIAGNOSIS — Z9884 Bariatric surgery status: Secondary | ICD-10-CM | POA: Diagnosis not present

## 2021-09-15 DIAGNOSIS — D801 Nonfamilial hypogammaglobulinemia: Secondary | ICD-10-CM | POA: Insufficient documentation

## 2021-09-15 DIAGNOSIS — D509 Iron deficiency anemia, unspecified: Secondary | ICD-10-CM

## 2021-09-15 DIAGNOSIS — Z923 Personal history of irradiation: Secondary | ICD-10-CM | POA: Insufficient documentation

## 2021-09-15 DIAGNOSIS — Z85118 Personal history of other malignant neoplasm of bronchus and lung: Secondary | ICD-10-CM | POA: Diagnosis present

## 2021-09-15 DIAGNOSIS — C3411 Malignant neoplasm of upper lobe, right bronchus or lung: Secondary | ICD-10-CM

## 2021-09-15 DIAGNOSIS — Z79899 Other long term (current) drug therapy: Secondary | ICD-10-CM | POA: Diagnosis not present

## 2021-09-15 DIAGNOSIS — Z902 Acquired absence of lung [part of]: Secondary | ICD-10-CM | POA: Diagnosis not present

## 2021-09-15 DIAGNOSIS — Z87891 Personal history of nicotine dependence: Secondary | ICD-10-CM | POA: Insufficient documentation

## 2021-09-15 DIAGNOSIS — R911 Solitary pulmonary nodule: Secondary | ICD-10-CM

## 2021-09-15 DIAGNOSIS — J9809 Other diseases of bronchus, not elsewhere classified: Secondary | ICD-10-CM | POA: Diagnosis not present

## 2021-09-15 LAB — CBC WITH DIFFERENTIAL/PLATELET
Abs Immature Granulocytes: 0.05 10*3/uL (ref 0.00–0.07)
Basophils Absolute: 0.1 10*3/uL (ref 0.0–0.1)
Basophils Relative: 1 %
Eosinophils Absolute: 0.4 10*3/uL (ref 0.0–0.5)
Eosinophils Relative: 5 %
HCT: 42.5 % (ref 36.0–46.0)
Hemoglobin: 13.2 g/dL (ref 12.0–15.0)
Immature Granulocytes: 1 %
Lymphocytes Relative: 19 %
Lymphs Abs: 1.4 10*3/uL (ref 0.7–4.0)
MCH: 26.4 pg (ref 26.0–34.0)
MCHC: 31.1 g/dL (ref 30.0–36.0)
MCV: 85 fL (ref 80.0–100.0)
Monocytes Absolute: 0.9 10*3/uL (ref 0.1–1.0)
Monocytes Relative: 12 %
Neutro Abs: 4.9 10*3/uL (ref 1.7–7.7)
Neutrophils Relative %: 62 %
Platelets: 378 10*3/uL (ref 150–400)
RBC: 5 MIL/uL (ref 3.87–5.11)
RDW: 18.3 % — ABNORMAL HIGH (ref 11.5–15.5)
WBC: 7.8 10*3/uL (ref 4.0–10.5)
nRBC: 0 % (ref 0.0–0.2)

## 2021-09-15 LAB — IRON AND TIBC
Iron: 63 ug/dL (ref 28–170)
Saturation Ratios: 22 % (ref 10.4–31.8)
TIBC: 283 ug/dL (ref 250–450)
UIBC: 220 ug/dL

## 2021-09-15 LAB — FERRITIN: Ferritin: 35 ng/mL (ref 11–307)

## 2021-09-15 NOTE — Progress Notes (Signed)
Hematology/Oncology Consult Note [facility]  [phone] [phone]  Patient Care Team: Albina Billet, MD as PCP - General (Internal Medicine) Marden Noble, MD (Internal Medicine) Bary Castilla, Forest Gleason, MD (General Surgery) Telford Nab, RN as Oncology Nurse Navigator Noreene Filbert, MD as Referring Physician (Radiation Oncology) Sindy Guadeloupe, MD as Consulting Physician (Oncology)   Name of the patient: Carol Shaffer  [id number]  05-25-1945   Date of visit: 09/15/21  Diagnosis- history of stage I lung cancer Iron deficiency anemia   Chief complaint/ Reason for visit- routine follow-up of lung cancer and iron deficiency anemia  Heme/Onc history: Patient is a 76 year old female with a history of lung cancer in 2012 s/p right upper lobe resection.  Low dose chest CT on 09/04/2018 revealed multiple small pulmonary nodules are noted in the lungs bilaterally. In addition, there was a 1.53 cm concerning nodule in the right lower lobe. There was diffuse bronchial wall thickening with mild centrilobular and paraseptal emphysema; imaging findings suggestive of underlying COPD.PET scan on 09/16/2018 revealed low level FDG uptake associated with the right lower lobe lung nodule (SUV 1.57). This was a nonspecific finding. Given the size of this nodule and the low level uptake, findings may reflect inflammatory or infectious nodule. Malignancy was not entirely excluded.  Low dose chest CT on 12/04/2018 revealed a 15.3 mm RLL nodule unchanged in size.   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  There were some ground-glass attenuation components, but is predominantly solid in appearance with some internal air bronchograms, with macrolobulated and spiculated borders, clearly increased in size.   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  Tumor was positive for CK7 and TTF-1 and negative for p40.  She is not a surgical candidate.   She received 6000 cGy SBRT to the RLL from 08/05/2019 - 08/17/2019.   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  History of iron deficiency anemia and she has undergone work-up in the past.  She has a history of gastric bypass surgery.    Interval history- Patient is 76 year old female who returns to clinic for continued surveillance of lung cancer and iron deficiency anemia post gastric bypass. She reports an episode of breathlessness that woke her from sleep once. Has chronic wheezing. Struggles with allergies and uses antihistamines and flonase routinely. No history of asthma. Hasn't seen pulmonology in many years.   ECOG PS- 1  Review of systems- Review of Systems  Constitutional:  Negative for chills, fever, malaise/fatigue and weight loss.  HENT:  Negative for congestion, ear discharge and nosebleeds.   Eyes:  Negative for blurred vision.  Respiratory:  Positive for shortness of breath and wheezing. Negative for cough, hemoptysis and sputum production.   Cardiovascular:  Negative for chest pain, palpitations, orthopnea and claudication.  Gastrointestinal:  Negative for abdominal pain, blood in stool, constipation, diarrhea, heartburn, melena, nausea and vomiting.  Genitourinary:  Negative for dysuria, flank pain, frequency, hematuria and urgency.  Musculoskeletal:  Negative for back pain, joint pain and myalgias.  Skin:  Negative for rash.  Neurological:  Negative for dizziness, tingling, focal weakness, seizures, weakness and headaches.  Endo/Heme/Allergies:  Positive for environmental allergies. Does not bruise/bleed easily.  Psychiatric/Behavioral:  Negative for depression and suicidal ideas. The patient does not have insomnia.  Allergies  Allergen Reactions    Hydromorphone Hcl Itching   Bentyl [Dicyclomine] Nausea And Vomiting   Biaxin [Clarithromycin] Other (See Comments)    hallucinations   Erythromycin Nausea And Vomiting   Reglan [Metoclopramide] Other (See Comments)    tremors     Past Medical History:  Diagnosis Date   Arthritis    hands   Avascular necrosis of hip, right (HCC)    Blepharospasm    Cancer ([place]) 2011   Right Upper Lobe Lobectomy   Chronic diarrhea    Chronic diarrhea    Collagenous colitis    COPD (chronic obstructive pulmonary disease) (HCC)    Depression    Diverticulosis    Gastric outlet obstruction    Headache    every couple of days   Hemorrhoid    History of Crohn's disease    Hypoglycemic disorder    IDA (iron deficiency anemia)    Intestinal adhesions    Multiple gastric ulcers    Multiple gastric ulcers    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[place])    Stroke ([place]) 8 yrs ago   double vision left eye     Past Surgical History:  Procedure Laterality Date   APPENDECTOMY  1989   botox injections for blepharospasm     BREAST SURGERY     CATARACT EXTRACTION     COLON RESECTION  2005   COLONOSCOPY  10-29-2008   [name]   COLONOSCOPY WITH PROPOFOL N/A 03/05/2017   Procedure: COLONOSCOPY WITH PROPOFOL;  Surgeon: Toledo, Benay Pike, MD;  Location: [facility];  Service: Gastroenterology;  Laterality: N/A;   COLOSTOMY REVERSAL  2006   ESOPHAGOGASTRODUODENOSCOPY (EGD) WITH PROPOFOL N/A 07/18/2016   Procedure: ESOPHAGOGASTRODUODENOSCOPY (EGD) WITH PROPOFOL;  Surgeon: Robert Bellow, MD;  Location: [facility];  Service: Endoscopy;  Laterality: N/A;   ESOPHAGOGASTRODUODENOSCOPY (EGD) WITH PROPOFOL N/A 08/03/2016   Procedure: ESOPHAGOGASTRODUODENOSCOPY (EGD) WITH PROPOFOL;  Surgeon: Robert Bellow, MD;  Location: [facility];  Service: Endoscopy;  Laterality: N/A;   ESOPHAGOGASTRODUODENOSCOPY (EGD) WITH PROPOFOL N/A 09/01/2018   Procedure: ESOPHAGOGASTRODUODENOSCOPY (EGD) WITH PROPOFOL;   Surgeon: Toledo, Benay Pike, MD;  Location: [facility];  Service: Gastroenterology;  Laterality: N/A;   ESOPHAGOGASTRODUODENOSCOPY (EGD) WITH PROPOFOL N/A 12/01/2018   Procedure: ESOPHAGOGASTRODUODENOSCOPY (EGD) WITH PROPOFOL;  Surgeon: Toledo, Benay Pike, MD;  Location: [facility];  Service: Gastroenterology;  Laterality: N/A;   ESOPHAGOGASTRODUODENOSCOPY (EGD) WITH PROPOFOL N/A 02/23/2019   Procedure: ESOPHAGOGASTRODUODENOSCOPY (EGD) WITH PROPOFOL;  Surgeon: Lucilla Lame, MD;  Location: [place];  Service: Endoscopy;  Laterality: N/A;   ESOPHAGOSCOPY WITH DILITATION  2012,2015   [facility] [place]   EYE SURGERY     GASTRECTOMY     gastric ulcer  1989, 1991   JOINT REPLACEMENT     right total hip arthroplasty 03/03/02   LAPAROSCOPIC LYSIS OF ADHESIONS     LUNG CANCER SURGERY  2011   LYSIS OF ADHESION     PLACEMENT OF BREAST IMPLANTS  1973   thoracotomy with right upper lobectomy and central compartment node dissection     UPPER GASTROINTESTINAL ENDOSCOPY  10-29-2008   [name]    Social History   Socioeconomic History   Marital status: Widowed    Spouse name: Not on file   Number of children: 2   Years of education: Not on file   Highest education level: Not on file  Occupational History   Occupation: retired    Comment: Pharmacist, hospital  Tobacco Use   Smoking status: Former  Packs/day: 1.00    Years: 25.00    Total pack years: 25.00    Types: Cigarettes    Quit date: 01/30/2008    Years since quitting: 13.6   Smokeless tobacco: Never  Vaping Use   Vaping Use: Never used  Substance and Sexual Activity   Alcohol use: Yes    Comment: wine once per month   Drug use: No   Sexual activity: Not on file  Other Topics Concern   Not on file  Social History Narrative   Lives alone    Social Determinants of Health   Financial Resource Strain: Not on file  Food Insecurity: Not on file  Transportation Needs: Not on file  Physical Activity: Not on file  Stress: Not on  file  Social Connections: Not on file  Intimate Partner Violence: Not on file    History reviewed. No pertinent family history.   Current Outpatient Medications:    acetaminophen (TYLENOL) 500 MG tablet, Take 500 mg by mouth every 6 (six) hours as needed., Disp: , Rfl:    brimonidine-timolol (COMBIGAN) 0.2-0.5 % ophthalmic solution, INT 1 GTT IN OU BID, Disp: , Rfl:    fluticasone (FLONASE) 50 MCG/ACT nasal spray, Place into both nostrils daily., Disp: , Rfl:    HYDROcodone-acetaminophen (NORCO/VICODIN) 5-325 MG per tablet, Take 1 tablet by mouth every 6 (six) hours as needed. , Disp: , Rfl:    loratadine (CLARITIN) 10 MG tablet, Take 10 mg by mouth daily., Disp: , Rfl:    Multiple Vitamin (MULTI-VITAMINS) TABS, Take 1 tablet by mouth daily. once daily., Disp: , Rfl:    pantoprazole (PROTONIX) 40 MG tablet, Take 40 mg by mouth 2 (two) times daily. , Disp: , Rfl:    sucralfate (CARAFATE) 1 G tablet, Take 1 g by mouth 4 (four) times daily. , Disp: , Rfl:    traZODone (DESYREL) 150 MG tablet, Take 300 mg by mouth at bedtime. , Disp: , Rfl:    venlafaxine XR (EFFEXOR-XR) 150 MG 24 hr capsule, Take 150 mg by mouth daily with breakfast. , Disp: , Rfl:   Physical exam:  Vitals:   09/15/21 1316  BP: 135/71  Pulse: 90  Resp: 14  Temp: (!) 97.5 F (36.4 C)  SpO2: 96%  Weight: 80 lb 11.2 oz (36.6 kg)   Physical Exam Constitutional:      General: She is not in acute distress. Cardiovascular:     Rate and Rhythm: Normal rate and regular rhythm.  Pulmonary:     Effort: Pulmonary effort is normal.     Breath sounds: Wheezing present.  Abdominal:     General: There is no distension.     Palpations: Abdomen is soft.  Skin:    General: Skin is warm.     Coloration: Skin is not pale.  Neurological:     Mental Status: She is alert and oriented to person, place, and time.  Psychiatric:        Mood and Affect: Mood normal.        Behavior: Behavior normal.         Latest Ref Rng &  Units 08/25/2020   10:19 AM  CMP  Creatinine 0.44 - 1.00 mg/dL 0.90       Latest Ref Rng & Units 09/15/2021   12:59 PM  CBC  WBC 4.0 - 10.5 K/uL 7.8   Hemoglobin 12.0 - 15.0 g/dL 13.2   Hematocrit 36.0 - 46.0 % 42.5   Platelets 150 - 400 K/uL  378    Iron/TIBC/Ferritin/ %Sat    Component Value Date/Time   IRON 63 09/15/2021 1259   TIBC 283 09/15/2021 1259   FERRITIN 35 09/15/2021 1259   IRONPCTSAT 22 09/15/2021 1259   CT Chest Wo Contrast  Result Date: 09/14/2021 CLINICAL DATA:  Lung cancer restaging. 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 * Tracking Code: BO * EXAM: CT CHEST WITHOUT CONTRAST TECHNIQUE: Multidetector CT imaging of the chest was performed following the standard protocol without IV contrast. RADIATION DOSE REDUCTION: This exam was performed according to the departmental dose-optimization program which includes automated exposure control, adjustment of the mA and/or kV according to patient size and/or use of iterative reconstruction technique. COMPARISON:  03/08/2021 FINDINGS: Cardiovascular: Coronary, aortic arch, and branch vessel atherosclerotic vascular disease. Trace anterior pericardial effusion. Mediastinum/Nodes: Upper normal sized right lower paratracheal node, 0.9 cm in short axis on image 53 series 2, previously 0.6 cm. Subcarinal node 0.8 cm in short axis on image 67 series 2, formerly 0.6 cm. Lungs/Pleura: Centrilobular emphysema. Right upper lobectomy. Severe narrowing and possible occlusion of the right middle lobe bronchus medially on image 68 series 3. There is likewise some narrowing of the bronchus intermedius proximal right lower lobe bronchus. Right lower lobe peribronchovascular crescentic consolidation on image 61 series 3 measuring about 3.6 cm in long axis, stable from 03/08/2021 and not substantially changed in morphology, probably resulting from prior radiation  therapy. No substantial change or progression in this region to indicate intervally progressive malignancy. Left lower lobe sub solid nodule 10 by 7 by 7 mm (volume = 300 mm^3) on image 91 series 3, formerly same by my measurements. Upper Abdomen: Fluid density left kidney upper pole lesions favoring cysts. No further imaging workup of these renal lesions is indicated. Musculoskeletal: Levoconvex upper and dextroconvex lower thoracic scoliosis. Bilateral breast implants with capsular calcifications. Soft tissue density surrounds the right breast implant as before, cannot exclude capsular leak. IMPRESSION: 1. Stable crescentic consolidation in the right lower lobe favoring prior treatment site. 2. Stable 300 cubic mm left lower lobe sub solid nodule. Surveillance suggested. 3. Narrowing and possible occlusion of the right middle lobe bronchus, along with some narrowing of the bronchus intermedius and proximal right lower lobe bronchus, mildly worsened compared to 03/08/2021 but probably inflammatory. 4. Other imaging findings of potential clinical significance: Aortic Atherosclerosis (ICD10-I70.0) and Emphysema (ICD10-J43.9). Coronary atherosclerosis. Systemic atherosclerosis. Trace anterior pericardial effusion. Thoracic scoliosis. Soft tissue density surrounds the right breast implant as before, cannot exclude capsular leak. Electronically Signed   By: Van Clines M.D.   On: 09/14/2021 14:37     Assessment and plan- Patient is a 76 y.o. female for follow-up of following issues:  History of lung cancer: CT from 09/13/2021 reviewed which did not show evidence of progressive disease. Incidental findings as below. Clinically, stable. Continue surveillance imaging every 6 months for up to 3 years then annually thereafter.  Obstruction of bronchus- incidentally seen on ct from 09/13/21. Narrowing and possible occlusion of RML. Worse since 03/08/21. I reviewed with radiology, Dr. Janeece Fitting who did not feel that  there was evidence of tumor, more likely mucous vs inflammation. Patient denies recent infection. Trial otc expectorants such as mucinex. Reviewed pulmonary toilet. Will refer to pulmology for evaluation.  History of iron deficiency anemia: Patient is not presently anemic with H&H of 13.2/42.5  Ferritin levels are presently normal at 35 but decreased  with an iron saturation of 22%.  She does not require any IV iron at this time.    Disposition: Ref to pulm  6 mo- ct chest  Day to week later - lab, see me- la   Visit Diagnosis 1. Malignant neoplasm of upper lobe of right lung ([place])   2. Bronchial obstruction   3. Iron deficiency anemia, unspecified iron deficiency anemia type    Beckey Rutter, DNP, AGNP-C [place] at [facility] [phone] (clinic) 09/15/2021

## 2021-10-23 ENCOUNTER — Institutional Professional Consult (permissible substitution): Payer: Medicare Other | Admitting: Student in an Organized Health Care Education/Training Program

## 2021-10-24 ENCOUNTER — Telehealth: Payer: Self-pay

## 2021-10-24 ENCOUNTER — Ambulatory Visit (INDEPENDENT_AMBULATORY_CARE_PROVIDER_SITE_OTHER): Payer: Medicare Other | Admitting: Student in an Organized Health Care Education/Training Program

## 2021-10-24 ENCOUNTER — Other Ambulatory Visit
Admission: RE | Admit: 2021-10-24 | Discharge: 2021-10-24 | Disposition: A | Discharge status: Home / Self Care | Payer: Medicare Other | Source: Ambulatory Visit | Attending: Student in an Organized Health Care Education/Training Program | Admitting: Student in an Organized Health Care Education/Training Program

## 2021-10-24 ENCOUNTER — Encounter: Payer: Self-pay | Admitting: Student in an Organized Health Care Education/Training Program

## 2021-10-24 VITALS — BP 120/80 | HR 96 | Temp 97.8°F | Ht <= 58 in | Wt 78.0 lb

## 2021-10-24 DIAGNOSIS — R791 Abnormal coagulation profile: Secondary | ICD-10-CM

## 2021-10-24 DIAGNOSIS — C3411 Malignant neoplasm of upper lobe, right bronchus or lung: Secondary | ICD-10-CM | POA: Diagnosis present

## 2021-10-24 DIAGNOSIS — C3431 Malignant neoplasm of lower lobe, right bronchus or lung: Secondary | ICD-10-CM

## 2021-10-24 LAB — CBC WITH DIFFERENTIAL/PLATELET
Abs Immature Granulocytes: 0.03 10*3/uL (ref 0.00–0.07)
Basophils Absolute: 0 10*3/uL (ref 0.0–0.1)
Basophils Relative: 1 %
Eosinophils Absolute: 0.2 10*3/uL (ref 0.0–0.5)
Eosinophils Relative: 2 %
HCT: 40.2 % (ref 36.0–46.0)
Hemoglobin: 12.7 g/dL (ref 12.0–15.0)
Immature Granulocytes: 0 %
Lymphocytes Relative: 18 %
Lymphs Abs: 1.4 10*3/uL (ref 0.7–4.0)
MCH: 26 pg (ref 26.0–34.0)
MCHC: 31.6 g/dL (ref 30.0–36.0)
MCV: 82.2 fL (ref 80.0–100.0)
Monocytes Absolute: 1 10*3/uL (ref 0.1–1.0)
Monocytes Relative: 13 %
Neutro Abs: 5.2 10*3/uL (ref 1.7–7.7)
Neutrophils Relative %: 66 %
Platelets: 397 10*3/uL (ref 150–400)
RBC: 4.89 MIL/uL (ref 3.87–5.11)
RDW: 15.9 % — ABNORMAL HIGH (ref 11.5–15.5)
WBC: 7.8 10*3/uL (ref 4.0–10.5)
nRBC: 0 % (ref 0.0–0.2)

## 2021-10-24 LAB — BASIC METABOLIC PANEL
Anion gap: 13 (ref 5–15)
BUN: 22 mg/dL (ref 8–23)
CO2: 28 mmol/L (ref 22–32)
Calcium: 8.4 mg/dL — ABNORMAL LOW (ref 8.9–10.3)
Chloride: 97 mmol/L — ABNORMAL LOW (ref 98–111)
Creatinine, Ser: 0.63 mg/dL (ref 0.44–1.00)
GFR, Estimated: >60 mL/min (ref >60)
Glucose, Bld: 102 mg/dL — ABNORMAL HIGH (ref 70–99)
Potassium: 3.7 mmol/L (ref 3.5–5.1)
Sodium: 138 mmol/L (ref 135–145)

## 2021-10-24 LAB — APTT: aPTT: 32 seconds (ref 24–36)

## 2021-10-24 LAB — PROTIME-INR
INR: 0.9 (ref 0.8–1.2)
Prothrombin Time: 11.6 seconds (ref 11.4–15.2)

## 2021-10-24 NOTE — Telephone Encounter (Signed)
Flexible bronchoscopy with EBUS scheduled 10/30/2021 at 12:00. CPT:31628,31652,31653 OI:[id number]

## 2021-10-24 NOTE — Telephone Encounter (Signed)
Patient has Medicare [place] Not Required for codes [phone], [phone], [phone]

## 2021-10-24 NOTE — Telephone Encounter (Signed)
Phone pre admit visit 10/26/2021 between 1-5 and covid test 10/27/2021 at 10:00.  Patient is aware of above dates/times and voiced her understanding.  Nothing further needed.

## 2021-10-24 NOTE — H&P (View-Only) (Signed)
Synopsis: Referred in for evaluation of bronchial narrowing by Verlon Au, NP  Assessment & Plan:   1. Malignant neoplasm of lower lobe of right lung [facility])  Patient has a history of lung ca s/p right upper lobectomy. 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 She underwent SBRT and has continued to have surveillance imaging. She was noted to have narrowing of her RML bronchus and BI on chest CT for which she is referred to pulmonary. On my review of her CT, I noted circumferential narrowing of the bronchus intermedius and right middle lobe bronchus, in addition to the consolidative opacity in the RLL that is consistent with the area where she received her SBRT. I also noted mediastinal lymphadenopathy in the right lower paratracheal and subcarinal regions.  Local tumor recurrence is on the differential and the patient will benefit from flexible bronchoscopy for airway survey for airway evaluation and possible endobronchial biopsies. EBUS is also indicated for sampling of the mediastinal lymph nodes.  We discussed the importance of diagnosis and staging in lung malignancies, and the approach to obtaining a tissue diagnosis which would include flexible bronchoscopy with endobronchial ultrasound guided sampling.  We also discussed the risks associated with the procedure which include a 2% risk of pneumothorax, infection, bleeding, and nondiagnostic procedure in detail.  I explained that patients typically are able to return home the same day of the procedure, but in rare cases admission to the hospital for observation and treatment is required.  After our discussion, the patient elected to proceed with the procedure  - CBC w/Diff; Future - Basic metabolic panel; Future - Protime-INR; Future - APTT; Future - Flexible bronchoscopy for airway survey, endobronchial biopsy, and EBUS with mediastinal lymph node staging.  I spent 60 minutes caring for  this patient today, including preparing to see the patient, obtaining and/or reviewing separately obtained history, performing a medically appropriate examination and/or evaluation, counseling and educating the patient/family/caregiver, ordering medications, tests, or procedures, and documenting clinical information in the electronic health record  Armando Reichert, MD [facility] 10/24/2021 1:41 PM    End of visit medications:  No orders of the defined types were placed in this encounter.    Current Outpatient Medications:    acetaminophen (TYLENOL) 500 MG tablet, Take 500 mg by mouth every 6 (six) hours as needed., Disp: , Rfl:    brimonidine-timolol (COMBIGAN) 0.2-0.5 % ophthalmic solution, INT 1 GTT IN OU BID, Disp: , Rfl:    fluticasone (FLONASE) 50 MCG/ACT nasal spray, Place into both nostrils daily., Disp: , Rfl:    HYDROcodone-acetaminophen (NORCO/VICODIN) 5-325 MG per tablet, Take 1 tablet by mouth every 6 (six) hours as needed. , Disp: , Rfl:    loratadine (CLARITIN) 10 MG tablet, Take 10 mg by mouth daily., Disp: , Rfl:    Multiple Vitamin (MULTI-VITAMINS) TABS, Take 1 tablet by mouth daily. once daily., Disp: , Rfl:    pantoprazole (PROTONIX) 40 MG tablet, Take 40 mg by mouth 2 (two) times daily. , Disp: , Rfl:    sucralfate (CARAFATE) 1 G tablet, Take 1 g by mouth 4 (four) times daily. , Disp: , Rfl:    traZODone (DESYREL) 150 MG tablet, Take 300 mg by mouth at bedtime. , Disp: , Rfl:    venlafaxine XR (EFFEXOR-XR) 150 MG 24 hr capsule, Take 150 mg by mouth daily with breakfast. , Disp: , Rfl:    Subjective:  PATIENT ID: Carol Shaffer GENDER: female DOB: 11/02/1945, MRN: [id number]  Chief Complaint  Patient presents with   New Patient (Initial Visit)    Coughing and wheezing a lot. Some SOB.    HPI  Ms. Hover is a pleasant 76 year old female presenting to clinic for the evaluation of an abnormality noted on her recent CT scan of the chest.  She has  a history of NSCLCa s/p RUL resection in 2012. In 2020 and 2021, serial CT scans of the chest were noted to have an enlarging RLL nodule which was biopsied under CT guidance. Pathology had shown a non-small cell tumor consistent with adenocarcinoma. She underwent SBRT to the RLL in July of 2021. She then continued to have surveillance CT scans read as having progressive and extensive radiation changes surrounding the RLL pulmonary lesion. Further surveillance imaging read as having stable ill-defined opacity with central bronchiectasis in the right perihilar region consistent with evolving post radiation changes. Most recently, in August of 2023, a repeat CT of the chest read with narrowing and possible occlusion of the RML bronchus with some narrowing of the BI. She is referred to pulmonary for an evaluation.  She reports that her symptoms are most notable for occasional cough and shortness of breath. This has been new and occurred over the past few months. She reports no hemoptysis, no sputum production, no chest pain and no chest tightness. She's reported some weight loss that was unintentional recently. She has not had any fevers, chills, or night sweats. She reports a 15 pack year smoking history.  Ancillary information including prior medications, full medical/surgical/family/social histories, and PFTs (when available) are listed below and have been reviewed.   Review of Systems  Constitutional:  Positive for weight loss. Negative for chills and fever.  Respiratory:  Positive for cough, shortness of breath and wheezing. Negative for hemoptysis and sputum production.      Objective:   Vitals:   10/24/21 1143  BP: 120/80  Pulse: 96  Temp: 97.8 F (36.6 C)  SpO2: 92%  Weight: 78 lb (35.4 kg)  Height: _0  (1.422 m)   92% on RA BMI Readings from Last 3 Encounters:  10/24/21 17.49 kg/m  09/15/21 15.76 kg/m  03/13/21 17.38 kg/m   Wt Readings from Last 3 Encounters:  10/24/21 78 lb  (35.4 kg)  09/15/21 80 lb 11.2 oz (36.6 kg)  03/13/21 89 lb (40.4 kg)    Physical Exam Constitutional:      General: She is not in acute distress.    Appearance: Normal appearance. She is not ill-appearing.  HENT:     Mouth/Throat:     Mouth: Mucous membranes are moist.  Eyes:     Pupils: Pupils are equal, round, and reactive to light.  Cardiovascular:     Rate and Rhythm: Normal rate and regular rhythm.     Pulses: Normal pulses.     Heart sounds: Normal heart sounds.  Pulmonary:     Effort: Pulmonary effort is normal.     Breath sounds: Normal breath sounds. No wheezing or rales.  Abdominal:     General: Abdomen is flat.     Palpations: Abdomen is soft.  Musculoskeletal:     Cervical back: Normal range of motion.  Skin:    General: Skin is warm.  Neurological:     General: No focal deficit present.     Mental Status: She is alert and oriented to person, place, and time. Mental status is at  baseline.       Ancillary Information    Past Medical History:  Diagnosis Date   Arthritis    hands   Avascular necrosis of hip, right ([place])    Blepharospasm    Cancer ([place]) 2011   Right Upper Lobe Lobectomy   Chronic diarrhea    Chronic diarrhea    Collagenous colitis    COPD (chronic obstructive pulmonary disease) (H[facility])    Depression    Diverticulosis    Gastric outlet obstruction    Headache    every couple of days   Hemorrhoid    History of Crohn's disease    Hypoglycemic disorder    IDA (iron deficiency anemia)    Intestinal adhesions    Multiple gastric ulcers    Multiple gastric ulcers    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[place])    Stroke ([place]) 8 yrs ago   double vision left eye     History reviewed. No pertinent family history.   Past Surgical History:  Procedure Laterality Date   APPENDECTOMY  1989   botox injections for blepharospasm     BREAST SURGERY     CATARACT EXTRACTION     COLON RESECTION  2005   COLONOSCOPY  10-29-2008   [name]    COLONOSCOPY WITH PROPOFOL N/A 03/05/2017   Procedure: COLONOSCOPY WITH PROPOFOL;  Surgeon: Toledo, Benay Pike, MD;  Location: [facility] END[facility]COPY;  Service: Gastroenterology;  Laterality: N/A;   COLOSTOMY REVERSAL  2006   ESOPHAGOGASTRODUODENOSCOPY (EGD) WITH PROPOFOL N/A 07/18/2016   Procedure: ESOPHAGOGASTRODUODENOSCOPY (EGD) WITH PROPOFOL;  Surgeon: Robert Bellow, MD;  Location: [facility] END[facility]COPY;  Service: Endoscopy;  Laterality: N/A;   ESOPHAGOGASTRODUODENOSCOPY (EGD) WITH PROPOFOL N/A 08/03/2016   Procedure: ESOPHAGOGASTRODUODENOSCOPY (EGD) WITH PROPOFOL;  Surgeon: Robert Bellow, MD;  Location: [facility] END[facility];  Service: Endoscopy;  Laterality: N/A;   ESOPHAGOGASTRODUODENOSCOPY (EGD) WITH PROPOFOL N/A 09/01/2018   Procedure: ESOPHAGOGASTRODUODENOSCOPY (EGD) WITH PROPOFOL;  Surgeon: Toledo, Benay Pike, MD;  Location: [facility] END[facility];  Service: Gastroenterology;  Laterality: N/A;   ESOPHAGOGASTRODUODENOSCOPY (EGD) WITH PROPOFOL N/A 12/01/2018   Procedure: ESOPHAGOGASTRODUODENOSCOPY (EGD) WITH PROPOFOL;  Surgeon: Toledo, Benay Pike, MD;  Location: [facility];  Service: Gastroenterology;  Laterality: N/A;   ESOPHAGOGASTRODUODENOSCOPY (EGD) WITH PROPOFOL N/A 02/23/2019   Procedure: ESOPHAGOGASTRODUODENOSCOPY (EGD) WITH PROPOFOL;  Surgeon: Lucilla Lame, MD;  Location: [facility];  Service: Endoscopy;  Laterality: N/A;   ESOPHAGOSCOPY WITH DILITATION  2012,2015   [facility] [place]   EYE SURGERY     GASTRECTOMY     gastric ulcer  1989, 1991   JOINT REPLACEMENT     right total hip arthroplasty 03/03/02   LAPAROSCOPIC LYSIS OF ADHESIONS     LUNG CANCER SURGERY  2011   LYSIS OF ADHESION     PLACEMENT OF BREAST IMPLANTS  1973   thoracotomy with right upper lobectomy and central compartment node dissection     UPPER GASTROINTESTINAL ENDOSCOPY  10-29-2008   [name]    Social History   Socioeconomic History   Marital status: Widowed    Spouse name: Not on file   Number of children: 2    Years of education: Not on file   Highest education level: Not on file  Occupational History   Occupation: retired    Comment: Pharmacist, hospital  Tobacco Use   Smoking status: Former    Packs/day: 1.00    Years: 25.00    Total pack years: 25.00    Types: Cigarettes    Quit date: 01/30/2008  Years since quitting: 13.7   Smokeless tobacco: Never  Vaping Use   Vaping Use: Never used  Substance and Sexual Activity   Alcohol use: Yes    Comment: wine once per month   Drug use: No   Sexual activity: Not on file  Other Topics Concern   Not on file  Social History Narrative   Lives alone    Social Determinants of Health   Financial Resource Strain: Not on file  Food Insecurity: Not on file  Transportation Needs: Not on file  Physical Activity: Not on file  Stress: Not on file  Social Connections: Not on file  Intimate Partner Violence: Not on file     Allergies  Allergen Reactions   Hydromorphone Hcl Itching   Bentyl [Dicyclomine] Nausea And Vomiting   Biaxin [Clarithromycin] Other (See Comments)    hallucinations   Budesonide Other (See Comments)    Ulcer   Erythromycin Nausea And Vomiting   Reglan [Metoclopramide] Other (See Comments)    tremors     CBC    Component Value Date/Time   WBC 7.8 10/24/2021 1245   RBC 4.89 10/24/2021 1245   HGB 12.7 10/24/2021 1245   HCT 40.2 10/24/2021 1245   PLT 397 10/24/2021 1245   MCV 82.2 10/24/2021 1245   MCH 26.0 10/24/2021 1245   MCHC 31.6 10/24/2021 1245   RDW 15.9 (H) 10/24/2021 1245   LYMPHSABS 1.4 10/24/2021 1245   MONOABS 1.0 10/24/2021 1245   EOSABS 0.2 10/24/2021 1245   BASOSABS 0.0 10/24/2021 1245    Pulmonary Functions Testing Results:     No data to display          Outpatient Medications Prior to Visit  Medication Sig Dispense Refill   acetaminophen (TYLENOL) 500 MG tablet Take 500 mg by mouth every 6 (six) hours as needed.     brimonidine-timolol (COMBIGAN) 0.2-0.5 % ophthalmic solution INT 1 GTT IN OU  BID     fluticasone (FLONASE) 50 MCG/ACT nasal spray Place into both nostrils daily.     HYDROcodone-acetaminophen (NORCO/VICODIN) 5-325 MG per tablet Take 1 tablet by mouth every 6 (six) hours as needed.      loratadine (CLARITIN) 10 MG tablet Take 10 mg by mouth daily.     Multiple Vitamin (MULTI-VITAMINS) TABS Take 1 tablet by mouth daily. once daily.     pantoprazole (PROTONIX) 40 MG tablet Take 40 mg by mouth 2 (two) times daily.      sucralfate (CARAFATE) 1 G tablet Take 1 g by mouth 4 (four) times daily.      traZODone (DESYREL) 150 MG tablet Take 300 mg by mouth at bedtime.      venlafaxine XR (EFFEXOR-XR) 150 MG 24 hr capsule Take 150 mg by mouth daily with breakfast.      No facility-administered medications prior to visit.

## 2021-10-24 NOTE — Progress Notes (Signed)
Synopsis: Referred in for evaluation of bronchial narrowing by Carol Au, NP  Assessment & Plan:   1. Malignant neoplasm of lower lobe of right lung [facility])  Patient has a history of lung ca s/p right upper lobectomy. 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 She underwent SBRT and has continued to have surveillance imaging. She was noted to have narrowing of her RML bronchus and BI on chest CT for which she is referred to pulmonary. On my review of her CT, I noted circumferential narrowing of the bronchus intermedius and right middle lobe bronchus, in addition to the consolidative opacity in the RLL that is consistent with the area where she received her SBRT. I also noted mediastinal lymphadenopathy in the right lower paratracheal and subcarinal regions.  Local tumor recurrence is on the differential and the patient will benefit from flexible bronchoscopy for airway survey for airway evaluation and possible endobronchial biopsies. EBUS is also indicated for sampling of the mediastinal lymph nodes.  We discussed the importance of diagnosis and staging in lung malignancies, and the approach to obtaining a tissue diagnosis which would include flexible bronchoscopy with endobronchial ultrasound guided sampling.  We also discussed the risks associated with the procedure which include a 2% risk of pneumothorax, infection, bleeding, and nondiagnostic procedure in detail.  I explained that patients typically are able to return home the same day of the procedure, but in rare cases admission to the [facility] for observation and treatment is required.  After our discussion, the patient elected to proceed with the procedure  - CBC w/Diff; Future - Basic metabolic panel; Future - Protime-INR; Future - APTT; Future - Flexible bronchoscopy for airway survey, endobronchial biopsy, and EBUS with mediastinal lymph node staging.  I spent 60 minutes caring for  this patient today, including preparing to see the patient, obtaining and/or reviewing separately obtained history, performing a medically appropriate examination and/or evaluation, counseling and educating the patient/family/caregiver, ordering medications, tests, or procedures, and documenting clinical information in the electronic health record  Carol Reichert, MD [facility] 10/24/2021 1:41 PM    End of visit medications:  No orders of the defined types were placed in this encounter.    Current Outpatient Medications:    acetaminophen (TYLENOL) 500 MG tablet, Take 500 mg by mouth every 6 (six) hours as needed., Disp: , Rfl:    brimonidine-timolol (COMBIGAN) 0.2-0.5 % ophthalmic solution, INT 1 GTT IN OU BID, Disp: , Rfl:    fluticasone (FLONASE) 50 MCG/ACT nasal spray, Place into both nostrils daily., Disp: , Rfl:    HYDROcodone-acetaminophen (NORCO/VICODIN) 5-325 MG per tablet, Take 1 tablet by mouth every 6 (six) hours as needed. , Disp: , Rfl:    loratadine (CLARITIN) 10 MG tablet, Take 10 mg by mouth daily., Disp: , Rfl:    Multiple Vitamin (MULTI-VITAMINS) TABS, Take 1 tablet by mouth daily. once daily., Disp: , Rfl:    pantoprazole (PROTONIX) 40 MG tablet, Take 40 mg by mouth 2 (two) times daily. , Disp: , Rfl:    sucralfate (CARAFATE) 1 G tablet, Take 1 g by mouth 4 (four) times daily. , Disp: , Rfl:    traZODone (DESYREL) 150 MG tablet, Take 300 mg by mouth at bedtime. , Disp: , Rfl:    venlafaxine XR (EFFEXOR-XR) 150 MG 24 hr capsule, Take 150 mg by mouth daily with breakfast. , Disp: , Rfl:    Subjective:  PATIENT ID: Carol [facility] GENDER: female DOB: 01-20-1946, MRN: [id number]  Chief Complaint  Patient presents with   New Patient (Initial Visit)    Coughing and wheezing a lot. Some SOB.    HPI  Carol [facility] is a pleasant 76 year old female presenting to clinic for the evaluation of an abnormality noted on her recent CT scan of the chest.  She has  a history of NSCLCa s/p RUL resection in 2012. In 2020 and 2021, serial CT scans of the chest were noted to have an enlarging RLL nodule which was biopsied under CT guidance. Pathology had shown a non-small cell tumor consistent with adenocarcinoma. She underwent SBRT to the RLL in July of 2021. She then continued to have surveillance CT scans read as having progressive and extensive radiation changes surrounding the RLL pulmonary lesion. Further surveillance imaging read as having stable ill-defined opacity with central bronchiectasis in the right perihilar region consistent with evolving post radiation changes. Most recently, in August of 2023, a repeat CT of the chest read with narrowing and possible occlusion of the RML bronchus with some narrowing of the BI. She is referred to pulmonary for an evaluation.  She reports that her symptoms are most notable for occasional cough and shortness of breath. This has been new and occurred over the past few months. She reports no hemoptysis, no sputum production, no chest pain and no chest tightness. She's reported some weight loss that was unintentional recently. She has not had any fevers, chills, or night sweats. She reports a 15 pack year smoking history.  Ancillary information including prior medications, full medical/surgical/family/social histories, and PFTs (when available) are listed below and have been reviewed.   Review of Systems  Constitutional:  Positive for weight loss. Negative for chills and fever.  Respiratory:  Positive for cough, shortness of breath and wheezing. Negative for hemoptysis and sputum production.      Objective:   Vitals:   10/24/21 1143  BP: 120/80  Pulse: 96  Temp: 97.8 F (36.6 C)  SpO2: 92%  Weight: 78 lb (35.4 kg)  Height: _0  (1.422 m)   92% on RA BMI Readings from Last 3 Encounters:  10/24/21 17.49 kg/m  09/15/21 15.76 kg/m  03/13/21 17.38 kg/m   Wt Readings from Last 3 Encounters:  10/24/21 78 lb  (35.4 kg)  09/15/21 80 lb 11.2 oz (36.6 kg)  03/13/21 89 lb (40.4 kg)    Physical Exam Constitutional:      General: She is not in acute distress.    Appearance: Normal appearance. She is not ill-appearing.  HENT:     Mouth/Throat:     Mouth: Mucous membranes are moist.  Eyes:     Pupils: Pupils are equal, round, and reactive to light.  Cardiovascular:     Rate and Rhythm: Normal rate and regular rhythm.     Pulses: Normal pulses.     Heart sounds: Normal heart sounds.  Pulmonary:     Effort: Pulmonary effort is normal.     Breath sounds: Normal breath sounds. No wheezing or rales.  Abdominal:     General: Abdomen is flat.     Palpations: Abdomen is soft.  Musculoskeletal:     Cervical back: Normal range of motion.  Skin:    General: Skin is warm.  Neurological:     General: No focal deficit present.     Mental Status: She is alert and oriented to person, place, and time. Mental status is at  baseline.       Ancillary Information    Past Medical History:  Diagnosis Date   Arthritis    hands   Avascular necrosis of hip, right ([place])    Blepharospasm    Cancer ([place]) 2011   Right Upper Lobe Lobectomy   Chronic diarrhea    Chronic diarrhea    Collagenous colitis    COPD (chronic obstructive pulmonary disease) ([facility])    Depression    Diverticulosis    Gastric outlet obstruction    Headache    every couple of days   Hemorrhoid    History of Crohn's disease    Hypoglycemic disorder    IDA (iron deficiency anemia)    Intestinal adhesions    Multiple gastric ulcers    Multiple gastric ulcers    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[place])    Stroke ([place]) 8 yrs ago   double vision left eye     History reviewed. No pertinent family history.   Past Surgical History:  Procedure Laterality Date   APPENDECTOMY  1989   botox injections for blepharospasm     BREAST SURGERY     CATARACT EXTRACTION     COLON RESECTION  2005   COLONOSCOPY  10-29-2008   [name]    COLONOSCOPY WITH PROPOFOL N/A 03/05/2017   Procedure: COLONOSCOPY WITH PROPOFOL;  Surgeon: Toledo, Benay Pike, MD;  Location: [facility];  Service: Gastroenterology;  Laterality: N/A;   COLOSTOMY REVERSAL  2006   ESOPHAGOGASTRODUODENOSCOPY (EGD) WITH PROPOFOL N/A 07/18/2016   Procedure: ESOPHAGOGASTRODUODENOSCOPY (EGD) WITH PROPOFOL;  Surgeon: Robert Bellow, MD;  Location: [facility];  Service: Endoscopy;  Laterality: N/A;   ESOPHAGOGASTRODUODENOSCOPY (EGD) WITH PROPOFOL N/A 08/03/2016   Procedure: ESOPHAGOGASTRODUODENOSCOPY (EGD) WITH PROPOFOL;  Surgeon: Robert Bellow, MD;  Location: [facility];  Service: Endoscopy;  Laterality: N/A;   ESOPHAGOGASTRODUODENOSCOPY (EGD) WITH PROPOFOL N/A 09/01/2018   Procedure: ESOPHAGOGASTRODUODENOSCOPY (EGD) WITH PROPOFOL;  Surgeon: Toledo, Benay Pike, MD;  Location: [facility];  Service: Gastroenterology;  Laterality: N/A;   ESOPHAGOGASTRODUODENOSCOPY (EGD) WITH PROPOFOL N/A 12/01/2018   Procedure: ESOPHAGOGASTRODUODENOSCOPY (EGD) WITH PROPOFOL;  Surgeon: Toledo, Benay Pike, MD;  Location: [facility];  Service: Gastroenterology;  Laterality: N/A;   ESOPHAGOGASTRODUODENOSCOPY (EGD) WITH PROPOFOL N/A 02/23/2019   Procedure: ESOPHAGOGASTRODUODENOSCOPY (EGD) WITH PROPOFOL;  Surgeon: Lucilla Lame, MD;  Location: [place];  Service: Endoscopy;  Laterality: N/A;   ESOPHAGOSCOPY WITH DILITATION  2012,2015   [facility] [place]   EYE SURGERY     GASTRECTOMY     gastric ulcer  1989, 1991   JOINT REPLACEMENT     right total hip arthroplasty 03/03/02   LAPAROSCOPIC LYSIS OF ADHESIONS     LUNG CANCER SURGERY  2011   LYSIS OF ADHESION     PLACEMENT OF BREAST IMPLANTS  1973   thoracotomy with right upper lobectomy and central compartment node dissection     UPPER GASTROINTESTINAL ENDOSCOPY  10-29-2008   [name]    Social History   Socioeconomic History   Marital status: Widowed    Spouse name: Not on file   Number of children: 2    Years of education: Not on file   Highest education level: Not on file  Occupational History   Occupation: retired    Comment: Pharmacist, [employer]  Tobacco Use   Smoking status: Former    Packs/day: 1.00    Years: 25.00    Total pack years: 25.00    Types: Cigarettes    Quit date: 01/30/2008  Years since quitting: 13.7   Smokeless tobacco: Never  Vaping Use   Vaping Use: Never used  Substance and Sexual Activity   Alcohol use: Yes    Comment: wine once per month   Drug use: No   Sexual activity: Not on file  Other Topics Concern   Not on file  Social History Narrative   Lives alone    Social Determinants of Health   Financial Resource Strain: Not on file  Food Insecurity: Not on file  Transportation Needs: Not on file  Physical Activity: Not on file  Stress: Not on file  Social Connections: Not on file  Intimate Partner Violence: Not on file     Allergies  Allergen Reactions   Hydromorphone Hcl Itching   Bentyl [Dicyclomine] Nausea And Vomiting   Biaxin [Clarithromycin] Other (See Comments)    hallucinations   Budesonide Other (See Comments)    Ulcer   Erythromycin Nausea And Vomiting   Reglan [Metoclopramide] Other (See Comments)    tremors     CBC    Component Value Date/Time   WBC 7.8 10/24/2021 1245   RBC 4.89 10/24/2021 1245   HGB 12.7 10/24/2021 1245   HCT 40.2 10/24/2021 1245   PLT 397 10/24/2021 1245   MCV 82.2 10/24/2021 1245   MCH 26.0 10/24/2021 1245   MCHC 31.6 10/24/2021 1245   RDW 15.9 (H) 10/24/2021 1245   LYMPHSABS 1.4 10/24/2021 1245   MONOABS 1.0 10/24/2021 1245   EOSABS 0.2 10/24/2021 1245   BASOSABS 0.0 10/24/2021 1245    Pulmonary Functions Testing Results:     No data to display          Outpatient Medications Prior to Visit  Medication Sig Dispense Refill   acetaminophen (TYLENOL) 500 MG tablet Take 500 mg by mouth every 6 (six) hours as needed.     brimonidine-timolol (COMBIGAN) 0.2-0.5 % ophthalmic solution INT 1 GTT IN OU  BID     fluticasone (FLONASE) 50 MCG/ACT nasal spray Place into both nostrils daily.     HYDROcodone-acetaminophen (NORCO/VICODIN) 5-325 MG per tablet Take 1 tablet by mouth every 6 (six) hours as needed.      loratadine (CLARITIN) 10 MG tablet Take 10 mg by mouth daily.     Multiple Vitamin (MULTI-VITAMINS) TABS Take 1 tablet by mouth daily. once daily.     pantoprazole (PROTONIX) 40 MG tablet Take 40 mg by mouth 2 (two) times daily.      sucralfate (CARAFATE) 1 G tablet Take 1 g by mouth 4 (four) times daily.      traZODone (DESYREL) 150 MG tablet Take 300 mg by mouth at bedtime.      venlafaxine XR (EFFEXOR-XR) 150 MG 24 hr capsule Take 150 mg by mouth daily with breakfast.      No facility-administered medications prior to visit.

## 2021-10-26 ENCOUNTER — Encounter: Payer: Self-pay | Admitting: Urgent Care

## 2021-10-26 ENCOUNTER — Encounter
Admission: RE | Admit: 2021-10-26 | Discharge: 2021-10-26 | Disposition: A | Discharge status: Home / Self Care | Payer: Medicare Other | Source: Ambulatory Visit | Attending: Student in an Organized Health Care Education/Training Program | Admitting: Student in an Organized Health Care Education/Training Program

## 2021-10-26 VITALS — Ht <= 58 in | Wt 80.0 lb

## 2021-10-26 DIAGNOSIS — D509 Iron deficiency anemia, unspecified: Secondary | ICD-10-CM

## 2021-10-26 DIAGNOSIS — E162 Hypoglycemia, unspecified: Secondary | ICD-10-CM

## 2021-10-26 HISTORY — DX: Other complications of anesthesia, initial encounter: T88.59XA

## 2021-10-26 NOTE — Patient Instructions (Signed)
Your procedure is scheduled on: 10/30/21 Report to [facility]. To find out your arrival time please call [phone] between 1PM - 3PM on 10/26/21.  Remember: Instructions that are not followed completely may result in serious medical risk, up to and including death, or upon the discretion of your surgeon and anesthesiologist your surgery may need to be rescheduled.     _X__ 1. Do not eat food after midnight the night before your procedure.                 No gum chewing or hard candies. You may drink clear liquids up to 2 hours                 before you are scheduled to arrive for your surgery- DO not drink clear                 liquids within 2 hours of the start of your surgery.                 Clear Liquids include:  water, apple juice without pulp, clear carbohydrate                 drink such as Clearfast or Gatorade, Black Coffee or Tea (Do not add                 anything to coffee or tea). Diabetics water only  __X__2.  On the morning of surgery brush your teeth with toothpaste and water, you                 may rinse your mouth with mouthwash if you wish.  Do not swallow any              toothpaste of mouthwash.     _X__ 3.  No Alcohol for 24 hours before or after surgery.   _X__ 4.  Do Not Smoke or use e-cigarettes For 24 Hours Prior to Your Surgery.                 Do not use any chewable tobacco products for at least 6 hours prior to                 surgery.  ____  5.  Bring all medications with you on the day of surgery if instructed.   __X__  6.  Notify your doctor if there is any change in your medical condition      (cold, fever, infections).     Do not wear jewelry, make-up, hairpins, clips or nail polish. Do not wear lotions, powders, or perfumes.  Do not shave body hair 48 hours prior to surgery. Men may shave face and neck. Do not bring valuables to the hospital.    [facility] is not responsible for any  belongings or valuables.  Contacts, dentures/partials or body piercings may not be worn into surgery. Bring a case for your contacts, glasses or hearing aids, a denture cup will be supplied. Leave your suitcase in the car. After surgery it may be brought to your room. For patients admitted to the hospital, discharge time is determined by your treatment team.   Patients discharged the day of surgery will not be allowed to drive home.    __X__ Take these medicines the morning of surgery with A SIP OF WATER:    1. HYDROcodone-acetaminophen (NORCO/VICODIN) 5-325 MG per tablet  2. loratadine (CLARITIN) 10  MG tablet  3. pantoprazole (PROTONIX) 40 MG tablet  4. venlafaxine XR (EFFEXOR-XR) 150 MG 24 hr capsule  5.  6. You may use your Combigan eye drops and your Flonase nasal spray the morning of your procedure.  ____ Fleet Enema (as directed)   ____ Use CHG Soap/SAGE wipes as directed  ____ Use inhalers on the day of surgery  ____ Stop metformin/Janumet/Farxiga 2 days prior to surgery    ____ Take 1/2 of usual insulin dose the night before surgery. No insulin the morning          of surgery.   ____ Stop Blood Thinners Coumadin/Plavix/Xarelto/Pleta/Pradaxa/Eliquis/Effient/Aspirin  on   Or contact your Surgeon, Cardiologist or Medical Doctor regarding  ability to stop your blood thinners  __X__ Stop Anti-inflammatories 7 days before surgery such as Advil, Ibuprofen, Motrin,  BC or Goodies Powder, Naprosyn, Naproxen, Aleve, Aspirin    __X__ Stop all herbals and supplements, fish oil or vitamins  until after surgery.    ____ [place] to the hospital.   [place] [phone]

## 2021-10-27 ENCOUNTER — Encounter
Admission: RE | Admit: 2021-10-27 | Discharge: 2021-10-27 | Disposition: A | Discharge status: Home / Self Care | Payer: Medicare Other | Source: Ambulatory Visit | Attending: Student in an Organized Health Care Education/Training Program | Admitting: Student in an Organized Health Care Education/Training Program

## 2021-10-27 DIAGNOSIS — Z20822 Contact with and (suspected) exposure to covid-19: Secondary | ICD-10-CM

## 2021-10-27 DIAGNOSIS — D509 Iron deficiency anemia, unspecified: Secondary | ICD-10-CM | POA: Diagnosis not present

## 2021-10-27 DIAGNOSIS — Z1152 Encounter for screening for COVID-19: Secondary | ICD-10-CM | POA: Diagnosis not present

## 2021-10-27 DIAGNOSIS — Z01818 Encounter for other preprocedural examination: Secondary | ICD-10-CM | POA: Diagnosis not present

## 2021-10-28 LAB — SARS CORONAVIRUS 2 (TAT 6-24 HRS): SARS Coronavirus 2: NEGATIVE

## 2021-10-29 MED ORDER — CHLORHEXIDINE GLUCONATE 0.12 % MT SOLN: 15.0000 mL | Freq: Once | OROMUCOSAL | Status: AC

## 2021-10-29 MED ORDER — LACTATED RINGERS IV SOLN: INTRAVENOUS | Status: DC

## 2021-10-29 MED ORDER — ORAL CARE MOUTH RINSE: 15.0000 mL | Freq: Once | OROMUCOSAL | Status: AC

## 2021-10-29 MED ORDER — SODIUM CHLORIDE 0.9 % IV SOLN: Freq: Once | INTRAVENOUS | Status: DC

## 2021-10-30 ENCOUNTER — Ambulatory Visit: Payer: Medicare Other

## 2021-10-30 ENCOUNTER — Ambulatory Visit: Payer: Medicare Other | Admitting: Anesthesiology

## 2021-10-30 ENCOUNTER — Ambulatory Visit
Admission: RE | Admit: 2021-10-30 | Discharge: 2021-10-30 | Disposition: A | Discharge status: Home / Self Care | Payer: Medicare Other | Source: Ambulatory Visit | Attending: Student in an Organized Health Care Education/Training Program | Admitting: Student in an Organized Health Care Education/Training Program

## 2021-10-30 ENCOUNTER — Encounter
Admission: RE | Disposition: A | Discharge status: Home / Self Care | Payer: Self-pay | Source: Ambulatory Visit | Attending: Student in an Organized Health Care Education/Training Program

## 2021-10-30 ENCOUNTER — Other Ambulatory Visit: Payer: Self-pay

## 2021-10-30 ENCOUNTER — Encounter: Payer: Self-pay | Admitting: Student in an Organized Health Care Education/Training Program

## 2021-10-30 DIAGNOSIS — Z902 Acquired absence of lung [part of]: Secondary | ICD-10-CM | POA: Insufficient documentation

## 2021-10-30 DIAGNOSIS — C3431 Malignant neoplasm of lower lobe, right bronchus or lung: Secondary | ICD-10-CM | POA: Insufficient documentation

## 2021-10-30 DIAGNOSIS — Z87891 Personal history of nicotine dependence: Secondary | ICD-10-CM | POA: Diagnosis not present

## 2021-10-30 DIAGNOSIS — F32A Depression, unspecified: Secondary | ICD-10-CM | POA: Diagnosis not present

## 2021-10-30 DIAGNOSIS — Z8616 Personal history of COVID-19: Secondary | ICD-10-CM | POA: Insufficient documentation

## 2021-10-30 DIAGNOSIS — H532 Diplopia: Secondary | ICD-10-CM | POA: Insufficient documentation

## 2021-10-30 DIAGNOSIS — C3491 Malignant neoplasm of unspecified part of right bronchus or lung: Secondary | ICD-10-CM | POA: Diagnosis not present

## 2021-10-30 DIAGNOSIS — Z96641 Presence of right artificial hip joint: Secondary | ICD-10-CM | POA: Insufficient documentation

## 2021-10-30 DIAGNOSIS — I69398 Other sequelae of cerebral infarction: Secondary | ICD-10-CM | POA: Insufficient documentation

## 2021-10-30 DIAGNOSIS — E162 Hypoglycemia, unspecified: Secondary | ICD-10-CM

## 2021-10-30 DIAGNOSIS — Z923 Personal history of irradiation: Secondary | ICD-10-CM | POA: Diagnosis not present

## 2021-10-30 DIAGNOSIS — C771 Secondary and unspecified malignant neoplasm of intrathoracic lymph nodes: Secondary | ICD-10-CM | POA: Diagnosis not present

## 2021-10-30 HISTORY — PX: VIDEO BRONCHOSCOPY WITH ENDOBRONCHIAL ULTRASOUND: SHX6177

## 2021-10-30 HISTORY — PX: FLEXIBLE BRONCHOSCOPY: SHX5094

## 2021-10-30 SURGERY — BRONCHOSCOPY, FLEXIBLE
Anesthesia: General

## 2021-10-30 MED ORDER — IPRATROPIUM-ALBUTEROL 0.5-2.5 (3) MG/3ML IN SOLN
RESPIRATORY_TRACT | Status: AC
  Filled 2021-10-30: qty 3

## 2021-10-30 MED ORDER — LIDOCAINE HCL (CARDIAC) PF 100 MG/5ML IV SOSY
PREFILLED_SYRINGE | INTRAVENOUS | Status: DC | PRN
  Administered 2021-10-30: 60 mg via INTRAVENOUS

## 2021-10-30 MED ORDER — IPRATROPIUM-ALBUTEROL 0.5-2.5 (3) MG/3ML IN SOLN
3.0000 mL | RESPIRATORY_TRACT | Status: DC | PRN
  Administered 2021-10-30: 3 mL via RESPIRATORY_TRACT

## 2021-10-30 MED ORDER — DEXAMETHASONE SODIUM PHOSPHATE 10 MG/ML IJ SOLN
INTRAMUSCULAR | Status: DC | PRN
  Administered 2021-10-30: 5 mg via INTRAVENOUS

## 2021-10-30 MED ORDER — ROCURONIUM BROMIDE 10 MG/ML (PF) SYRINGE
PREFILLED_SYRINGE | INTRAVENOUS | Status: AC
  Filled 2021-10-30: qty 10

## 2021-10-30 MED ORDER — PROPOFOL 10 MG/ML IV BOLUS
INTRAVENOUS | Status: DC | PRN
  Administered 2021-10-30: 100 mg via INTRAVENOUS

## 2021-10-30 MED ORDER — PHENYLEPHRINE 80 MCG/ML (10ML) SYRINGE FOR IV PUSH (FOR BLOOD PRESSURE SUPPORT)
PREFILLED_SYRINGE | INTRAVENOUS | Status: AC
  Filled 2021-10-30: qty 10

## 2021-10-30 MED ORDER — FENTANYL CITRATE (PF) 100 MCG/2ML IJ SOLN: 25.0000 ug | INTRAMUSCULAR | Status: DC | PRN

## 2021-10-30 MED ORDER — CHLORHEXIDINE GLUCONATE 0.12 % MT SOLN
OROMUCOSAL | Status: AC
  Administered 2021-10-30: 15 mL via OROMUCOSAL
  Filled 2021-10-30: qty 15

## 2021-10-30 MED ORDER — ONDANSETRON HCL 4 MG/2ML IJ SOLN
INTRAMUSCULAR | Status: DC | PRN
  Administered 2021-10-30: 4 mg via INTRAVENOUS

## 2021-10-30 MED ORDER — ROCURONIUM BROMIDE 100 MG/10ML IV SOLN
INTRAVENOUS | Status: DC | PRN
  Administered 2021-10-30: 20 mg via INTRAVENOUS
  Administered 2021-10-30: 10 mg via INTRAVENOUS

## 2021-10-30 MED ORDER — PROPOFOL 10 MG/ML IV BOLUS
INTRAVENOUS | Status: AC
  Filled 2021-10-30: qty 40

## 2021-10-30 MED ORDER — FENTANYL CITRATE (PF) 100 MCG/2ML IJ SOLN
INTRAMUSCULAR | Status: AC
  Filled 2021-10-30: qty 2

## 2021-10-30 MED ORDER — PROPOFOL 500 MG/50ML IV EMUL
INTRAVENOUS | Status: DC | PRN
  Administered 2021-10-30: 80 ug/kg/min via INTRAVENOUS

## 2021-10-30 MED ORDER — PHENYLEPHRINE HCL (PRESSORS) 10 MG/ML IV SOLN
INTRAVENOUS | Status: DC | PRN
  Administered 2021-10-30 (×5): 80 ug via INTRAVENOUS

## 2021-10-30 MED ORDER — SUGAMMADEX SODIUM 200 MG/2ML IV SOLN
INTRAVENOUS | Status: DC | PRN
  Administered 2021-10-30: 150 mg via INTRAVENOUS

## 2021-10-30 MED ORDER — ONDANSETRON HCL 4 MG/2ML IJ SOLN: 4.0000 mg | Freq: Once | INTRAMUSCULAR | Status: DC | PRN

## 2021-10-30 MED ORDER — SUCCINYLCHOLINE CHLORIDE 200 MG/10ML IV SOSY
PREFILLED_SYRINGE | INTRAVENOUS | Status: AC
  Filled 2021-10-30: qty 10

## 2021-10-30 MED ORDER — PROPOFOL 10 MG/ML IV BOLUS
INTRAVENOUS | Status: AC
  Filled 2021-10-30: qty 20

## 2021-10-30 MED ORDER — SUCCINYLCHOLINE CHLORIDE 200 MG/10ML IV SOSY
PREFILLED_SYRINGE | INTRAVENOUS | Status: DC | PRN
  Administered 2021-10-30: 80 mg via INTRAVENOUS

## 2021-10-30 MED ORDER — FENTANYL CITRATE (PF) 100 MCG/2ML IJ SOLN
INTRAMUSCULAR | Status: DC | PRN
  Administered 2021-10-30 (×2): 50 ug via INTRAVENOUS

## 2021-10-30 NOTE — Op Note (Signed)
Video Bronchoscopy Procedure Note  Date of Procedure: 10/30/2021  Surgeon: Armando Reichert, MD  Anesthesia: general anesthesia  Meds Given: TIVA per anesthesia, 1% lidocaine 6 cc total  Operation: Flexible video fiberoptic bronchoscopy and biopsies. EBUS  Estimated Blood Loss: 10 cc  Complications: none noted  Indications and History: Carol [facility] is 76 y.o. with history of NSCLCa s/p RUL r esection (2012) with a RLL nodule noted and biopsied (NSCLCa, adeno) s/p SBRT in July of 2021. Surveillance imaging was notable for narrowing of her bronchus intermedius and lymphadenopathy. Recommendation was to perform video fiberoptic bronchoscopy with biopsies in addition to EBUS with FNA. The risks, benefits, complications, treatment options and expected outcomes were discussed with the patient.  The possibilities of pneumothorax, pneumonia, reaction to medication, pulmonary aspiration, perforation of a viscus, bleeding, failure to diagnose a condition and creating a complication requiring transfusion or operation were discussed with the patient who freely signed the consent.    Description of Procedure: The patient was seen in the Preoperative Area, was examined and was deemed appropriate to proceed.  The patient was taken to the procedure room 2, identified as [facility] and the procedure verified as Flexible Video Fiberoptic Bronchoscopy with EBUS.  A Time Out was held and the above information confirmed.   General anesthesia was initiated as indicated above. The video fiberoptic bronchoscope was introduced via the ET tube and a general inspection was performed which showed normal trachea, normal main carina. The left side was inspected first, and the LLL, Lingular and LUL airways were normal. The R sided airways were inspected and were notable for significant narrowing of the bronchus intermedius. The bronchoscope could not be advanced beyond the narrowing. The mucosa was very friable.  We then  proceeded with the biopsy portion. Endobronchial forceps biopsies were performed through the working channel of a flexible bronchoscope. There was some initial mild bleeding that stopped with iced saline. 5 endobronchial biopsies were obtained and sent in formalin for examination.  We then switched the EBUS portion of the procedure. Stations 7 and 4R were examined. The lymph nodes at said stations were noted to be enlarged (15-20 mm), heterogeneous, and without a central hilar structure. FNA was performed with real time US guidance using a 21G Olympus ViziShot 2 needle. 5 samples were obtained at each station and sent for cytology.  Following this, the airway was suctioned of blood and secretions, 6 cc of lidocaine instilled, and the bronchoscope was removed. There were no obvious complications.   Samples: 1. Endobronchial forceps biopsies from Bronchus intermedius 2. EBUS with FNA at stations 7 and 4R  Plans:    -await pathology and cytology results  Images (post biopsy)  -Carina    -proximal Bronchus Intermedius    -distal Bronchus Intermedius    Armando Reichert, MD [facility] 10/30/2021 2:11 PM

## 2021-10-30 NOTE — Discharge Instructions (Addendum)
AMBULATORY SURGERY  DISCHARGE INSTRUCTIONS   The drugs that you were given will stay in your system until tomorrow so for the next 24 hours you should not:  Drive an automobile Make any legal decisions Drink any alcoholic beverage   You may resume regular meals tomorrow.  Today it is better to start with liquids and gradually work up to solid foods.  You may eat anything you prefer, but it is better to start with liquids, then soup and crackers, and gradually work up to solid foods.   Please notify your doctor immediately if you have any unusual bleeding, trouble breathing, redness and pain at the surgery site, drainage, fever, or pain not relieved by medication.    Additional Instructions: Expect blood tinged sputum and a low grade fever for about 12 hours and should subside quickly. Dr Janese Banks will be reaching out to you for your followup.       Please contact your physician with any problems or Same Day Surgery at [phone], Monday through Friday 6 am to 4 pm, or [place] at [facility] number at [phone].

## 2021-10-30 NOTE — Anesthesia Postprocedure Evaluation (Signed)
Anesthesia Post Note  Patient: Carol [facility]  Procedure(s) Performed: FLEXIBLE BRONCHOSCOPY VIDEO BRONCHOSCOPY WITH ENDOBRONCHIAL ULTRASOUND  Patient location during evaluation: PACU Anesthesia Type: General Level of consciousness: awake Pain management: satisfactory to patient Vital Signs Assessment: post-procedure vital signs reviewed and stable Respiratory status: spontaneous breathing and nonlabored ventilation Cardiovascular status: stable Anesthetic complications: no   No notable events documented.   Last Vitals:  Vitals:   10/30/21 1125 10/30/21 1350  BP: 135/73 111/85  Pulse: (!) 102 93  Resp: 16 20  Temp: 36.8 C 36.8 C  SpO2: 98% 95%    Last Pain:  Vitals:   10/30/21 1350  TempSrc:   PainSc: 0-No pain                 VAN STAVEREN,Alondra Sahni

## 2021-10-30 NOTE — Anesthesia Procedure Notes (Signed)
Procedure Name: Intubation Date/Time: 10/30/2021 12:55 PM  Performed by: Jonna Clark, CRNAPre-anesthesia Checklist: Patient identified, Patient being monitored, Timeout performed, Emergency Drugs available and Suction available Patient Re-evaluated:Patient Re-evaluated prior to induction Oxygen Delivery Method: Circle system utilized Preoxygenation: Pre-oxygenation with 100% oxygen Induction Type: IV induction Ventilation: Mask ventilation without difficulty Laryngoscope Size: 3 and McGraph Grade View: Grade I Tube type: Oral Tube size: 8.0 mm Number of attempts: 1 Airway Equipment and Method: Stylet Placement Confirmation: ETT inserted through vocal cords under direct vision, positive ETCO2 and breath sounds checked- equal and bilateral Secured at: 20 cm Tube secured with: Tape Dental Injury: Teeth and Oropharynx as per pre-operative assessment

## 2021-10-30 NOTE — Anesthesia Preprocedure Evaluation (Signed)
Anesthesia Evaluation  Patient identified by MRN, date of birth, ID band Patient awake    Reviewed: Allergy & Precautions, NPO status , Patient's Chart, lab work & pertinent test results  Airway Mallampati: III  TM Distance: <3 FB Neck ROM: full  Mouth opening: Limited Mouth Opening  Dental  (+) Implants, Dental Advisory Given   Pulmonary neg pulmonary ROS, COPD, former smoker,    Pulmonary exam normal breath sounds clear to auscultation       Cardiovascular Exercise Tolerance: Good negative cardio ROS Normal cardiovascular exam Rhythm:Regular     Neuro/Psych  Headaches, Depression CVA, Residual Symptoms negative neurological ROS  negative psych ROS   GI/Hepatic negative GI ROS, Neg liver ROS, PUD,   Endo/Other  negative endocrine ROS  Renal/GU   negative genitourinary   Musculoskeletal  (+) Arthritis ,   Abdominal Normal abdominal exam  (+)   Peds negative pediatric ROS (+)  Hematology negative hematology ROS (+) Blood dyscrasia, anemia ,   Anesthesia Other Findings Past Medical History: No date: Arthritis     Comment:  hands No date: Avascular necrosis of hip, right (HCC) No date: Blepharospasm 2011: Cancer ([place])     Comment:  Right Upper Lobe Lobectomy No date: Chronic diarrhea No date: Chronic diarrhea No date: Collagenous colitis No date: Complication of anesthesia     Comment:  usually wakes up during procedures  (endoscopy and               colonoscopy) No date: COPD (chronic obstructive pulmonary disease) ([place]) 2022: COVID No date: Depression No date: Diverticulosis No date: Gastric outlet obstruction No date: Headache     Comment:  every couple of days No date: Hemorrhoid No date: History of Crohn's disease No date: Hypoglycemic disorder No date: IDA (iron deficiency anemia) No date: Intestinal adhesions No date: Multiple gastric ulcers No date: Multiple gastric ulcers No date: Stenosis of  gastrointestinal structure ([place]) 8 yrs ago: Stroke [facility])     Comment:  double vision left eye  Past Surgical History: 1989: APPENDECTOMY No date: botox injections for blepharospasm No date: BREAST SURGERY No date: CATARACT EXTRACTION 2005: COLON RESECTION 10-29-2008: COLONOSCOPY     Comment:  [name] 03/05/2017: COLONOSCOPY WITH PROPOFOL; N/A     Comment:  Procedure: COLONOSCOPY WITH PROPOFOL;  Surgeon: [place],               Benay Pike, MD;  Location: [facility];  Service:               Gastroenterology;  Laterality: N/A; 2006: COLOSTOMY REVERSAL 07/18/2016: ESOPHAGOGASTRODUODENOSCOPY (EGD) WITH PROPOFOL; N/A     Comment:  Procedure: ESOPHAGOGASTRODUODENOSCOPY (EGD) WITH               PROPOFOL;  Surgeon: Robert Bellow, MD;  Location:               [facility];  Service: Endoscopy;  Laterality: N/A; 08/03/2016: ESOPHAGOGASTRODUODENOSCOPY (EGD) WITH PROPOFOL; N/A     Comment:  Procedure: ESOPHAGOGASTRODUODENOSCOPY (EGD) WITH               PROPOFOL;  Surgeon: Robert Bellow, MD;  Location:               [facility];  Service: Endoscopy;  Laterality: N/A; 09/01/2018: ESOPHAGOGASTRODUODENOSCOPY (EGD) WITH PROPOFOL; N/A     Comment:  Procedure: ESOPHAGOGASTRODUODENOSCOPY (EGD) WITH               PROPOFOL;  Surgeon: Alice Reichert, Benay Pike, MD;  Location:               [facility];  Service: Gastroenterology;  Laterality:               N/A; 12/01/2018: ESOPHAGOGASTRODUODENOSCOPY (EGD) WITH PROPOFOL; N/A     Comment:  Procedure: ESOPHAGOGASTRODUODENOSCOPY (EGD) WITH               PROPOFOL;  Surgeon: Toledo, Benay Pike, MD;  Location:               [facility];  Service: Gastroenterology;  Laterality:               N/A; 02/23/2019: ESOPHAGOGASTRODUODENOSCOPY (EGD) WITH PROPOFOL; N/A     Comment:  Procedure: ESOPHAGOGASTRODUODENOSCOPY (EGD) WITH               PROPOFOL;  Surgeon: Lucilla Lame, MD;  Location: [place];  Service: Endoscopy;  Laterality:  N/A; [id number]: ESOPHAGOSCOPY WITH DILITATION     Comment:  [facility] [place] No date: EYE SURGERY No date: GASTRECTOMY 1989, 1991: gastric ulcer No date: JOINT REPLACEMENT     Comment:  right total hip arthroplasty 03/03/02 No date: LAPAROSCOPIC LYSIS OF ADHESIONS 2011: LUNG CANCER SURGERY No date: LYSIS OF ADHESION 1973: PLACEMENT OF BREAST IMPLANTS No date: thoracotomy with right upper lobectomy and central  compartment node dissection 10-29-2008: UPPER GASTROINTESTINAL ENDOSCOPY     Comment:  [name]  BMI    Body Mass Index: 15.62 kg/m      Reproductive/Obstetrics negative OB ROS                             Anesthesia Physical Anesthesia Plan  ASA: 3  Anesthesia Plan: General   Post-op Pain Management:    Induction: Intravenous  PONV Risk Score and Plan: Ondansetron, Dexamethasone, Midazolam and Treatment may vary due to age or medical condition  Airway Management Planned: Oral ETT  Additional Equipment:   Intra-op Plan:   Post-operative Plan: Extubation in OR  Informed Consent: I have reviewed the patients History and Physical, chart, labs and discussed the procedure including the risks, benefits and alternatives for the proposed anesthesia with the patient or authorized representative who has indicated his/her understanding and acceptance.     Dental Advisory Given  Plan Discussed with: CRNA and Surgeon  Anesthesia Plan Comments:         Anesthesia Quick Evaluation

## 2021-10-30 NOTE — Interval H&P Note (Signed)
Saw and examined patient. Presents with narrowing of her BI into the RML on CT prompting a fiberoptic evaluation with flexible bronchoscopy. Also has mediastinal lymphadenopathy that will be evaluated with EBUS. Patient is appropriate for the procedure.  Armando Reichert, MD [facility] 10/30/2021 12:05 PM

## 2021-10-30 NOTE — Transfer of Care (Signed)
Immediate Anesthesia Transfer of Care Note  Patient: Carol Shaffer  Procedure(s) Performed: FLEXIBLE BRONCHOSCOPY VIDEO BRONCHOSCOPY WITH ENDOBRONCHIAL ULTRASOUND  Patient Location: PACU  Anesthesia Type:General  Level of Consciousness: drowsy and patient cooperative  Airway & Oxygen Therapy: Patient Spontanous Breathing  Post-op Assessment: Report given to RN and Post -op Vital signs reviewed and stable  Post vital signs: Reviewed and stable  Last Vitals:  Vitals Value Taken Time  BP 111/85 10/30/21 1350  Temp    Pulse    Resp 26 10/30/21 1351  SpO2 95 % 10/30/21 1350  Vitals shown include unvalidated device data.  Last Pain:  Vitals:   10/30/21 1125  TempSrc: Temporal  PainSc: 5          Complications: No notable events documented.

## 2021-10-31 ENCOUNTER — Encounter: Payer: Self-pay | Admitting: Student in an Organized Health Care Education/Training Program

## 2021-10-31 LAB — CYTOLOGY - NON PAP

## 2021-10-31 LAB — SURGICAL PATHOLOGY

## 2021-11-03 ENCOUNTER — Encounter: Payer: Self-pay | Admitting: Oncology

## 2021-11-03 ENCOUNTER — Other Ambulatory Visit (HOSPITAL_COMMUNITY): Payer: Self-pay | Admitting: Student

## 2021-11-03 ENCOUNTER — Other Ambulatory Visit: Payer: Self-pay

## 2021-11-03 ENCOUNTER — Inpatient Hospital Stay: Payer: Medicare Other | Attending: Oncology | Admitting: Oncology

## 2021-11-03 VITALS — BP 123/90 | HR 103 | Temp 97.5°F | Resp 14 | Wt 75.1 lb

## 2021-11-03 DIAGNOSIS — Z7189 Other specified counseling: Secondary | ICD-10-CM

## 2021-11-03 DIAGNOSIS — D509 Iron deficiency anemia, unspecified: Secondary | ICD-10-CM | POA: Diagnosis present

## 2021-11-03 DIAGNOSIS — R0789 Other chest pain: Secondary | ICD-10-CM | POA: Diagnosis not present

## 2021-11-03 DIAGNOSIS — Z5111 Encounter for antineoplastic chemotherapy: Secondary | ICD-10-CM | POA: Insufficient documentation

## 2021-11-03 DIAGNOSIS — D801 Nonfamilial hypogammaglobulinemia: Secondary | ICD-10-CM | POA: Diagnosis not present

## 2021-11-03 DIAGNOSIS — Z9884 Bariatric surgery status: Secondary | ICD-10-CM | POA: Insufficient documentation

## 2021-11-03 DIAGNOSIS — Z87891 Personal history of nicotine dependence: Secondary | ICD-10-CM | POA: Diagnosis not present

## 2021-11-03 DIAGNOSIS — Z79899 Other long term (current) drug therapy: Secondary | ICD-10-CM | POA: Insufficient documentation

## 2021-11-03 DIAGNOSIS — C342 Malignant neoplasm of middle lobe, bronchus or lung: Secondary | ICD-10-CM | POA: Insufficient documentation

## 2021-11-03 MED ORDER — OXYCODONE HCL 5 MG PO TABS: 5.0000 mg | ORAL_TABLET | Freq: Three times a day (TID) | ORAL | 0 refills | Status: DC | PRN

## 2021-11-03 NOTE — Progress Notes (Signed)
Hematology/Oncology Consult note [facility]  [phone] [phone]  Patient Care Team: Albina Billet, MD as PCP - General (Internal Medicine) Marden Noble, MD (Internal Medicine) Bary Castilla, Forest Gleason, MD (General Surgery) Telford Nab, RN as Oncology Nurse Navigator Noreene Filbert, MD as Referring Physician (Radiation Oncology) Sindy Guadeloupe, MD as Consulting Physician (Oncology)   Name of the patient: Carol Shaffer  [id number]  Oct 22, 1945   Date of visit: 11/03/21  Diagnosis- history of stage I lung cancer now with recurrent disease  Iron deficiency anemia  Chief complaint/ Reason for visit- discuss ct scan results  Heme/Onc history: Patient is a 76 year old female with a history oflung cancer in 2012 s/p right upper lobe resection.  Low dose chest CT on 09/04/2018 revealed multiple small pulmonary nodules are noted in the lungs bilaterally. In addition, there was a 1.53 cm concerning nodule in the right lower lobe. There was diffuse bronchial wall thickening with mild centrilobular and paraseptal emphysema; imaging findings suggestive of underlying COPD.PET scan on 09/16/2018 revealed low level FDG uptake associated with the right lower lobe lung nodule (SUV 1.57). This was a nonspecific finding. Given the size of this nodule and the low level uptake, findings may reflect inflammatory or infectious nodule. Malignancy was not entirely excluded.  Low dose chest CT on 12/04/2018 revealed a 15.3 mm RLL nodule unchanged in size.   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  There were some ground-glass attenuation components, but is predominantly solid in appearance with some internal air bronchograms, with macrolobulated and spiculated borders, clearly increased in size.   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  Tumor was positive for CK7 and TTF-1 and negative for p40.  She is not a surgical candidate.   She received 6000 cGy SBRT to the RLL from 08/05/2019 - 08/17/2019.   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  History of iron deficiency anemia and she has undergone work-up in the past.  She has a history of gastric bypass surgery.    Interval history-patient reports occasional wheezing.  She does have some baseline fatigue and exertional shortness of breath.  Also reports right chest wall pain that has especially worsened since her bronchoscopy.  She finds it difficult to move around due to pain  ECOG PS- 1 Pain scale- 4   Review of systems- Review of Systems  Constitutional:  Positive for malaise/fatigue. Negative for chills, fever and weight loss.  HENT:  Negative for congestion, ear discharge and nosebleeds.   Eyes:  Negative for blurred vision.  Respiratory:  Negative for hemoptysis, sputum production, shortness of breath and wheezing.        Right chest wall pain  Cardiovascular:  Negative for chest pain, palpitations, orthopnea and claudication.  Gastrointestinal:  Negative for abdominal pain, blood in stool, constipation, diarrhea, heartburn, melena, nausea and vomiting.  Genitourinary:  Negative for dysuria, flank pain, frequency, hematuria and urgency.  Musculoskeletal:  Negative for back pain, joint pain and myalgias.  Skin:  Negative for rash.  Neurological:  Negative for dizziness, tingling, focal weakness, seizures, weakness and headaches.  Endo/Heme/Allergies:  Does not bruise/bleed easily.  Psychiatric/Behavioral:  Negative for depression and suicidal ideas. The patient does not have insomnia.       Allergies  Allergen Reactions  Hydromorphone Hcl Itching   Bentyl [Dicyclomine] Nausea And Vomiting   Biaxin [Clarithromycin] Other (See Comments)     hallucinations   Budesonide Other (See Comments)    Ulcer   Erythromycin Nausea And Vomiting   Reglan [Metoclopramide] Other (See Comments)    tremors     Past Medical History:  Diagnosis Date   Arthritis    hands   Avascular necrosis of hip, right ([place])    Blepharospasm    Cancer ([place]) 2011   Right Upper Lobe Lobectomy   Chronic diarrhea    Chronic diarrhea    Collagenous colitis    Complication of anesthesia    usually wakes up during procedures  (endoscopy and colonoscopy)   COPD (chronic obstructive pulmonary disease) ([place])    COVID 2022   Depression    Diverticulosis    Gastric outlet obstruction    Headache    every couple of days   Hemorrhoid    History of Crohn's disease    Hypoglycemic disorder    IDA (iron deficiency anemia)    Intestinal adhesions    Multiple gastric ulcers    Multiple gastric ulcers    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[place])    Stroke ([place]) 8 yrs ago   double vision left eye     Past Surgical History:  Procedure Laterality Date   APPENDECTOMY  1989   botox injections for blepharospasm     BREAST SURGERY     CATARACT EXTRACTION     COLON RESECTION  2005   COLONOSCOPY  10-29-2008   [name]   COLONOSCOPY WITH PROPOFOL N/A 03/05/2017   Procedure: COLONOSCOPY WITH PROPOFOL;  Surgeon: Toledo, Benay Pike, MD;  Location: [facility];  Service: Gastroenterology;  Laterality: N/A;   COLOSTOMY REVERSAL  2006   ESOPHAGOGASTRODUODENOSCOPY (EGD) WITH PROPOFOL N/A 07/18/2016   Procedure: ESOPHAGOGASTRODUODENOSCOPY (EGD) WITH PROPOFOL;  Surgeon: Robert Bellow, MD;  Location: [facility];  Service: Endoscopy;  Laterality: N/A;   ESOPHAGOGASTRODUODENOSCOPY (EGD) WITH PROPOFOL N/A 08/03/2016   Procedure: ESOPHAGOGASTRODUODENOSCOPY (EGD) WITH PROPOFOL;  Surgeon: Robert Bellow, MD;  Location: [facility];  Service: Endoscopy;  Laterality: N/A;   ESOPHAGOGASTRODUODENOSCOPY (EGD) WITH PROPOFOL N/A 09/01/2018   Procedure:  ESOPHAGOGASTRODUODENOSCOPY (EGD) WITH PROPOFOL;  Surgeon: Toledo, Benay Pike, MD;  Location: [facility];  Service: Gastroenterology;  Laterality: N/A;   ESOPHAGOGASTRODUODENOSCOPY (EGD) WITH PROPOFOL N/A 12/01/2018   Procedure: ESOPHAGOGASTRODUODENOSCOPY (EGD) WITH PROPOFOL;  Surgeon: Toledo, Benay Pike, MD;  Location: [facility];  Service: Gastroenterology;  Laterality: N/A;   ESOPHAGOGASTRODUODENOSCOPY (EGD) WITH PROPOFOL N/A 02/23/2019   Procedure: ESOPHAGOGASTRODUODENOSCOPY (EGD) WITH PROPOFOL;  Surgeon: Lucilla Lame, MD;  Location: [place];  Service: Endoscopy;  Laterality: N/A;   ESOPHAGOSCOPY WITH DILITATION  2012,2015   [facility] [place]   EYE SURGERY     FLEXIBLE BRONCHOSCOPY N/A 10/30/2021   Procedure: FLEXIBLE BRONCHOSCOPY;  Surgeon: Armando Reichert, MD;  Location: [facility];  Service: Pulmonary;  Laterality: N/A;   GASTRECTOMY     gastric ulcer  1989, 1991   JOINT REPLACEMENT     right total hip arthroplasty 03/03/02   LAPAROSCOPIC LYSIS OF ADHESIONS     LUNG CANCER SURGERY  2011   LYSIS OF ADHESION     PLACEMENT OF BREAST IMPLANTS  1973   thoracotomy with right upper lobectomy and central compartment node dissection     UPPER GASTROINTESTINAL ENDOSCOPY  10-29-2008   [name]   VIDEO BRONCHOSCOPY WITH ENDOBRONCHIAL ULTRASOUND N/A 10/30/2021   Procedure: VIDEO BRONCHOSCOPY WITH  ENDOBRONCHIAL ULTRASOUND;  Surgeon: Armando Reichert, MD;  Location: [facility];  Service: Pulmonary;  Laterality: N/A;    Social History   Socioeconomic History   Marital status: Widowed    Spouse name: Not on file   Number of children: 2   Years of education: Not on file   Highest education level: Not on file  Occupational History   Occupation: retired    Comment: Pharmacist, hospital  Tobacco Use   Smoking status: Former    Packs/day: 1.00    Years: 25.00    Total pack years: 25.00    Types: Cigarettes    Quit date: 01/30/2008    Years since quitting: 13.7   Smokeless tobacco: Never  Vaping Use    Vaping Use: Never used  Substance and Sexual Activity   Alcohol use: Yes    Comment: wine once per month   Drug use: No   Sexual activity: Not on file  Other Topics Concern   Not on file  Social History Narrative   Lives alone    Social Determinants of Health   Financial Resource Strain: Not on file  Food Insecurity: Not on file  Transportation Needs: Not on file  Physical Activity: Not on file  Stress: Not on file  Social Connections: Not on file  Intimate Partner Violence: Not on file    History reviewed. No pertinent family history.   Current Outpatient Medications:    acetaminophen (TYLENOL) 500 MG tablet, Take 500 mg by mouth every 6 (six) hours as needed., Disp: , Rfl:    brimonidine-timolol (COMBIGAN) 0.2-0.5 % ophthalmic solution, INT 1 GTT IN OU BID, Disp: , Rfl:    Cholecalciferol (VITAMIN D3) 1000 units CAPS, Take 1 capsule by mouth daily., Disp: , Rfl:    fluticasone (FLONASE) 50 MCG/ACT nasal spray, Place into both nostrils daily., Disp: , Rfl:    loratadine (CLARITIN) 10 MG tablet, Take 10 mg by mouth daily., Disp: , Rfl:    Multiple Vitamin (MULTI-VITAMINS) TABS, Take 1 tablet by mouth daily. once daily., Disp: , Rfl:    pantoprazole (PROTONIX) 40 MG tablet, Take 40 mg by mouth 2 (two) times daily. , Disp: , Rfl:    sucralfate (CARAFATE) 1 G tablet, Take 1 g by mouth 4 (four) times daily. , Disp: , Rfl:    traZODone (DESYREL) 150 MG tablet, Take 300 mg by mouth at bedtime. , Disp: , Rfl:    venlafaxine XR (EFFEXOR-XR) 150 MG 24 hr capsule, Take 150 mg by mouth daily with breakfast. , Disp: , Rfl:    oxyCODONE (OXY IR/ROXICODONE) 5 MG immediate release tablet, Take 1 tablet (5 mg total) by mouth 3 (three) times daily as needed for severe pain., Disp: 90 tablet, Rfl: 0  Physical exam:  Vitals:   11/03/21 1048  BP: (!) 123/90  Pulse: (!) 103  Resp: 14  Temp: (!) 97.5 F (36.4 C)  SpO2: 99%  Weight: 75 lb 1.6 oz (34.1 kg)   Physical Exam Cardiovascular:      Rate and Rhythm: Normal rate and regular rhythm.     Heart sounds: Normal heart sounds.  Pulmonary:     Effort: Pulmonary effort is normal.     Comments: Mild scattered wheezing Abdominal:     General: Bowel sounds are normal.     Palpations: Abdomen is soft.  Skin:    General: Skin is warm and dry.  Neurological:     Mental Status: She is alert and oriented to person, place, and  time.         Latest Ref Rng & Units 10/24/2021   12:45 PM  CMP  Glucose 70 - 99 mg/dL 102   BUN 8 - 23 mg/dL 22   Creatinine 0.44 - 1.00 mg/dL 0.63   Sodium 135 - 145 mmol/L 138   Potassium 3.5 - 5.1 mmol/L 3.7   Chloride 98 - 111 mmol/L 97   CO2 22 - 32 mmol/L 28   Calcium 8.9 - 10.3 mg/dL 8.4       Latest Ref Rng & Units 10/24/2021   12:45 PM  CBC  WBC 4.0 - 10.5 K/uL 7.8   Hemoglobin 12.0 - 15.0 g/dL 12.7   Hematocrit 36.0 - 46.0 % 40.2   Platelets 150 - 400 K/uL 397     No images are attached to the encounter.  DG Chest Port 1 View  Result Date: 10/30/2021 CLINICAL DATA:  Status post bronchoscopy with biopsy. EXAM: PORTABLE CHEST 1 VIEW COMPARISON:  Radiographs 06/13/2020 and 06/17/2019. Chest CT 09/13/2021. FINDINGS: No recent radiographs are available for correlation. There are postsurgical changes in the right hemithorax status post upper lobectomy. The degree of volume loss appears increased compared with the most recent CT, probably related to associated atelectasis. Right perihilar consolidation is grossly unchanged. The left lung is clear. There is no evidence of pneumothorax or significant pleural effusion. The heart size and mediastinal contours are stable. Bilateral breast implants are noted. IMPRESSION: 1. No evidence of pneumothorax following bronchoscopy. 2. Increased volume loss in the right hemithorax status post upper lobectomy, probably related to associated atelectasis. Electronically Signed   By: Richardean Sale M.D.   On: 10/30/2021 14:46     Assessment and plan-  Patient is a 76 y.o. female with history of right upper lobe lung cancer now with recurrent disease involving the right middle lobe  Patient hadCT scan of breast 2023 which showed narrowing and occlusion of the right middle lobe bronchus which was looking more prominent as compared to prior scans.  There was also concern for paratracheal adenopathy which had increased in size as compared to prior scans.  Patient was seen by pulmonary and underwent bronchoscopy guided biopsy.  Bronchial biopsies of the right middle lobe did not show any malignancy however cytology from station 7 and station 4R lymph node was consistent with adenocarcinoma.  This therefore constitute stage IIIa disease.  I recommend concurrent chemoradiation with [place] Taxol weekly for her.  Discussed risks and benefits of chemotherapy including all but not limited to nausea vomiting low blood counts, risk of infections and hospitalization.  Patient will be seeing Dr. Baruch Gouty from radiation oncology next week and based on when radiation starts we will plan to start chemotherapy.  Treatment will be given with a curative intent.  Patient lives in [place] and will need help with transportation.  I will obtain PET CT scan to rule out metastatic disease as well as MRI brain with and without contrast to complete her staging work-up  Right-sided chest wall pain: I will start her on oxycodone 5 mg 3 times daily as needed  I will tentatively see her back in 2 weeks time when she starts chemotherapy   Visit Diagnosis 1. Malignant neoplasm of middle lobe of right lung ([place])   2. Goals of care, counseling/discussion      Dr. Randa Evens, MD, MPH [facility] [phone] 11/03/2021 6:19 PM

## 2021-11-03 NOTE — Progress Notes (Signed)
Pt states since her Flexible bronchoscopy on 10/2she has been experiencing more back pain than usual. Pain level is 8/10 today.

## 2021-11-05 NOTE — H&P (Signed)
  Procedure Request: Chemotherapy access. Request is for portacath placement.   Carol Shaffer is a 76 y.o. femaleoutpatient. History of CVA. COPD. recurrent small cell carcinoma. Initially of the RUL s/p resection and SBRT. Found to have recurrence  now involving the RML. Team is requesting a portacath placement for chemotherapy access. Patient arrived to IR short stay having drank milk approximately 1 hour prior. Options discussed with the patient who has elected to reschedule the procedure for full sedation.

## 2021-11-06 ENCOUNTER — Ambulatory Visit
Admission: RE | Admit: 2021-11-06 | Discharge: 2021-11-06 | Disposition: A | Discharge status: Home / Self Care | Payer: Medicare Other | Source: Ambulatory Visit | Attending: Oncology | Admitting: Oncology

## 2021-11-06 DIAGNOSIS — C342 Malignant neoplasm of middle lobe, bronchus or lung: Secondary | ICD-10-CM

## 2021-11-06 MED ORDER — SODIUM CHLORIDE 0.9 % IV SOLN: INTRAVENOUS | Status: DC

## 2021-11-06 MED ORDER — LIDOCAINE HCL 1 % IJ SOLN
INTRAMUSCULAR | Status: AC
  Filled 2021-11-06: qty 20

## 2021-11-06 MED ORDER — HEPARIN SOD (PORK) LOCK FLUSH 100 UNIT/ML IV SOLN
INTRAVENOUS | Status: AC
  Filled 2021-11-06: qty 5

## 2021-11-06 NOTE — Progress Notes (Signed)
IR procedure request - portacath placement     76 y.o. female outpatient. History of CVA. COPD. recurrent small cell carcinoma. Initially of the RUL s/p resection and SBRT. Found to have recurrence  now involving the RML. Team is requesting a portacath placement for chemotherapy access. Patient presents to IR short stay having drank milk approximately 1 hour ago. Patient has elected to reschedule procedure to allow for maximum amount of sedation.

## 2021-11-06 NOTE — OR Nursing (Signed)
Pt arrived for port a cath insertion. She had a [employer] milk in her hand. She drank some if it 1 hr prior to arrival. She said no one told her to be NPO. She will reschedule for another day.

## 2021-11-07 ENCOUNTER — Encounter: Payer: Self-pay | Admitting: Radiation Oncology

## 2021-11-07 ENCOUNTER — Ambulatory Visit
Admission: RE | Admit: 2021-11-07 | Discharge: 2021-11-07 | Disposition: A | Discharge status: Home / Self Care | Payer: Medicare Other | Source: Ambulatory Visit | Attending: Radiation Oncology | Admitting: Radiation Oncology

## 2021-11-07 VITALS — BP 133/80 | HR 100 | Temp 97.3°F | Resp 16 | Ht 60.0 in | Wt 76.6 lb

## 2021-11-07 DIAGNOSIS — Z87891 Personal history of nicotine dependence: Secondary | ICD-10-CM | POA: Diagnosis not present

## 2021-11-07 DIAGNOSIS — Z510 Encounter for antineoplastic radiation therapy: Secondary | ICD-10-CM | POA: Diagnosis present

## 2021-11-07 DIAGNOSIS — C3491 Malignant neoplasm of unspecified part of right bronchus or lung: Secondary | ICD-10-CM

## 2021-11-07 DIAGNOSIS — C3431 Malignant neoplasm of lower lobe, right bronchus or lung: Secondary | ICD-10-CM | POA: Diagnosis present

## 2021-11-07 DIAGNOSIS — Z5111 Encounter for antineoplastic chemotherapy: Secondary | ICD-10-CM | POA: Insufficient documentation

## 2021-11-07 DIAGNOSIS — Z79899 Other long term (current) drug therapy: Secondary | ICD-10-CM | POA: Diagnosis not present

## 2021-11-07 DIAGNOSIS — Z902 Acquired absence of lung [part of]: Secondary | ICD-10-CM | POA: Diagnosis not present

## 2021-11-07 NOTE — Consult Note (Signed)
NEW PATIENT EVALUATION  Name: Carol Shaffer  MRN: [id number]  Date:   11/07/2021     DOB: 11-04-1945   This 76 y.o. female patient presents to the clinic for initial evaluation of stage IIIa non-small cell lung cancer of the right middle lobe and patient previously treated with SBRT back in 2021 to her right lower lobe.  As well as history of right upper lobectomy for non-small cell lung cancer back in 2012  REFERRING PHYSICIAN: Albina Billet, MD  CHIEF COMPLAINT:  Chief Complaint  Patient presents with   Lung Cancer    DIAGNOSIS: The encounter diagnosis was NSCLC of right lung ([place]).   PREVIOUS INVESTIGATIONS:  CT scans reviewed PET CT scan has been ordered for today Clinical notes reviewed Pathology report reviewed  HPI: Patient is a 76 year old female known to our department having received SBRT to right lower lobe back in 2021 for a non-small cell lung cancer.  She is also status post right upper lobectomy back in 2012 for again non-small cell lung cancer.  We treated her for right lower lobe non-small cell lung cancer consistent with adenocarcinoma back in July 2021 receiving 6 the Gray in 5 fractions.  Patient also has a history of chronic hypogammaglobulinemia and history of iron deficiency anemia.  CT scan back in August showed stable consolidation right lower lobe favoring SBRT treatment.  There was narrowing and possible occlusion of the right middle lobe bronchus mildly worsened compared to 2023.  She underwent bronchoscopy and lymph node from station 7 was positive for malignancy most likely adenocarcinoma.  She has a PET/CT scheduled for today.  She has been seen by medical oncology and is staged as a 3A disease and is now referred to radiation collagen for opinion.  She continues to have wheezing no significant cough hemoptysis or chest tightness.  PLANNED TREATMENT REGIMEN: Concurrent chemoradiation  PAST MEDICAL HISTORY:  has a past medical history of Arthritis,  Avascular necrosis of hip, right ([place]), Blepharospasm, Cancer ([place]) (2011), Chronic diarrhea, Chronic diarrhea, Collagenous colitis, Complication of anesthesia, COPD (chronic obstructive pulmonary disease) ([place]), COVID (2022), Depression, Diverticulosis, Gastric outlet obstruction, Headache, Hemorrhoid, History of Crohn's disease, Hypoglycemic disorder, IDA (iron deficiency anemia), Intestinal adhesions, Multiple gastric ulcers, Multiple gastric ulcers, 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[place]), and Stroke ([place]) (8 yrs ago).    PAST SURGICAL HISTORY:  Past Surgical History:  Procedure Laterality Date   APPENDECTOMY  1989   botox injections for blepharospasm     BREAST SURGERY     CATARACT EXTRACTION     COLON RESECTION  2005   COLONOSCOPY  10-29-2008   [name]   COLONOSCOPY WITH PROPOFOL N/A 03/05/2017   Procedure: COLONOSCOPY WITH PROPOFOL;  Surgeon: Toledo, Benay Pike, MD;  Location: [facility];  Service: Gastroenterology;  Laterality: N/A;   COLOSTOMY REVERSAL  2006   ESOPHAGOGASTRODUODENOSCOPY (EGD) WITH PROPOFOL N/A 07/18/2016   Procedure: ESOPHAGOGASTRODUODENOSCOPY (EGD) WITH PROPOFOL;  Surgeon: Robert Bellow, MD;  Location: [facility];  Service: Endoscopy;  Laterality: N/A;   ESOPHAGOGASTRODUODENOSCOPY (EGD) WITH PROPOFOL N/A 08/03/2016   Procedure: ESOPHAGOGASTRODUODENOSCOPY (EGD) WITH PROPOFOL;  Surgeon: Robert Bellow, MD;  Location: [facility];  Service: Endoscopy;  Laterality: N/A;   ESOPHAGOGASTRODUODENOSCOPY (EGD) WITH PROPOFOL N/A 09/01/2018   Procedure: ESOPHAGOGASTRODUODENOSCOPY (EGD) WITH PROPOFOL;  Surgeon: Toledo, Benay Pike, MD;  Location: [facility];  Service: Gastroenterology;  Laterality: N/A;   ESOPHAGOGASTRODUODENOSCOPY (EGD) WITH PROPOFOL N/A 12/01/2018   Procedure: ESOPHAGOGASTRODUODENOSCOPY (EGD) WITH PROPOFOL;  Surgeon: [place], [place],  MD;  Location: [facility];  Service: Gastroenterology;  Laterality: N/A;   ESOPHAGOGASTRODUODENOSCOPY  (EGD) WITH PROPOFOL N/A 02/23/2019   Procedure: ESOPHAGOGASTRODUODENOSCOPY (EGD) WITH PROPOFOL;  Surgeon: Lucilla Lame, MD;  Location: [place];  Service: Endoscopy;  Laterality: N/A;   ESOPHAGOSCOPY WITH DILITATION  2012,2015   [facility] [place]   EYE SURGERY     FLEXIBLE BRONCHOSCOPY N/A 10/30/2021   Procedure: FLEXIBLE BRONCHOSCOPY;  Surgeon: Armando Reichert, MD;  Location: [facility];  Service: Pulmonary;  Laterality: N/A;   GASTRECTOMY     gastric ulcer  1989, 1991   JOINT REPLACEMENT     right total hip arthroplasty 03/03/02   LAPAROSCOPIC LYSIS OF ADHESIONS     LUNG CANCER SURGERY  2011   LYSIS OF ADHESION     PLACEMENT OF BREAST IMPLANTS  1973   thoracotomy with right upper lobectomy and central compartment node dissection     UPPER GASTROINTESTINAL ENDOSCOPY  10-29-2008   [name]   VIDEO BRONCHOSCOPY WITH ENDOBRONCHIAL ULTRASOUND N/A 10/30/2021   Procedure: VIDEO BRONCHOSCOPY WITH ENDOBRONCHIAL ULTRASOUND;  Surgeon: Armando Reichert, MD;  Location: [facility];  Service: Pulmonary;  Laterality: N/A;    FAMILY HISTORY: family history is not on file.  SOCIAL HISTORY:  reports that she quit smoking about 13 years ago. Her smoking use included cigarettes. She has a 25.00 pack-year smoking history. She has never used smokeless tobacco. She reports current alcohol use. She reports that she does not use drugs.  ALLERGIES: Hydromorphone hcl, Bentyl [dicyclomine], Biaxin [clarithromycin], Budesonide, Erythromycin, and Reglan [metoclopramide]  MEDICATIONS:  Current Outpatient Medications  Medication Sig Dispense Refill   acetaminophen (TYLENOL) 500 MG tablet Take 500 mg by mouth every 6 (six) hours as needed.     brimonidine-timolol (COMBIGAN) 0.2-0.5 % ophthalmic solution INT 1 GTT IN OU BID     Cholecalciferol (VITAMIN D3) 1000 units CAPS Take 1 capsule by mouth daily.     fluticasone (FLONASE) 50 MCG/ACT nasal spray Place into both nostrils daily.     loratadine (CLARITIN) 10 MG  tablet Take 10 mg by mouth daily.     Multiple Vitamin (MULTI-VITAMINS) TABS Take 1 tablet by mouth daily. once daily.     oxyCODONE (OXY IR/ROXICODONE) 5 MG immediate release tablet Take 1 tablet (5 mg total) by mouth 3 (three) times daily as needed for severe pain. 90 tablet 0   pantoprazole (PROTONIX) 40 MG tablet Take 40 mg by mouth 2 (two) times daily.      sucralfate (CARAFATE) 1 G tablet Take 1 g by mouth 4 (four) times daily.      traZODone (DESYREL) 150 MG tablet Take 300 mg by mouth at bedtime.      venlafaxine XR (EFFEXOR-XR) 150 MG 24 hr capsule Take 150 mg by mouth daily with breakfast.      No current facility-administered medications for this encounter.    ECOG PERFORMANCE STATUS:  1 - Symptomatic but completely ambulatory  REVIEW OF SYSTEMS: Patient does have hypogammaglobulinemia as well as iron deficiency anemia continues to have wheezing Patient denies any weight loss, fatigue, weakness, fever, chills or night sweats. Patient denies any loss of vision, blurred vision. Patient denies any ringing  of the ears or hearing loss. No irregular heartbeat. Patient denies heart murmur or history of fainting. Patient denies any chest pain or pain radiating to her upper extremities. Patient denies any shortness of breath, difficulty breathing at night, cough or hemoptysis. Patient denies any swelling in the lower legs. Patient denies any  nausea vomiting, vomiting of blood, or coffee ground material in the vomitus. Patient denies any stomach pain. Patient states has had normal bowel movements no significant constipation or diarrhea. Patient denies any dysuria, hematuria or significant nocturia. Patient denies any problems walking, swelling in the joints or loss of balance. Patient denies any skin changes, loss of hair or loss of weight. Patient denies any excessive worrying or anxiety or significant depression. Patient denies any problems with insomnia. Patient denies excessive thirst, polyuria,  polydipsia. Patient denies any swollen glands, patient denies easy bruising or easy bleeding. Patient denies any recent infections, allergies or URI. Patient "s visual fields have not changed significantly in recent time.   PHYSICAL EXAM: BP 133/80 (BP Location: Left Arm, Patient Position: Sitting, Cuff Size: Small)   Pulse 100   Temp (!) 97.3 F (36.3 C) (Tympanic)   Resp 16   Ht 5' (1.524 m) Comment: stated Ht  Wt 76 lb 9.6 oz (34.7 kg)   BMI 14.96 kg/m  Thin female with obvious wheezing in NAD.  Well-developed well-nourished patient in NAD. HEENT reveals PERLA, EOMI, discs not visualized.  Oral cavity is clear. No oral mucosal lesions are identified. Neck is clear without evidence of cervical or supraclavicular adenopathy. Lungs are clear to A&P. Cardiac examination is essentially unremarkable with regular rate and rhythm without murmur rub or thrill. Abdomen is benign with no organomegaly or masses noted. Motor sensory and DTR levels are equal and symmetric in the upper and lower extremities. Cranial nerves II through XII are grossly intact. Proprioception is intact. No peripheral adenopathy or edema is identified. No motor or sensory levels are noted. Crude visual fields are within normal range.  LABORATORY DATA: Pathology and cytology reports reviewed    RADIOLOGY RESULTS: CT scan from August reviewed PET CT scan to be performed today will be reviewed when available   IMPRESSION: Stage IIIa adenocarcinoma of the right middle lobe in 76 year old female previously treated with SBRT as well as right upper lobectomy for adenocarcinoma in the past  PLAN: This time elect to evaluate her PET CT scan.  We will probably go ahead with 70 Gray to her tumor and hypermetabolic lymph nodes along with concurrent chemotherapy.  Risks and benefits of treatment including increased cough fatigue alteration of blood counts possible radiation esophagitis all were reviewed in detail with the patient.  She  seems to comprehend my treatment plan well.  We will make further recommendations after review of her PET CT scan.  I tentatively set her up for next week for CT simulation.  There will be extra effort by both professional staff as well as technical staff to coordinate and manage concurrent chemoradiation and ensuing side effects during her treatments.  Patient seems to comprehend my treatment plan well.  I would like to take this opportunity to thank you for allowing me to participate in the care of your patient.Noreene Filbert, MD

## 2021-11-08 ENCOUNTER — Ambulatory Visit
Admission: RE | Admit: 2021-11-08 | Discharge: 2021-11-08 | Disposition: A | Discharge status: Home / Self Care | Payer: Medicare Other | Source: Ambulatory Visit | Attending: Oncology | Admitting: Oncology

## 2021-11-08 DIAGNOSIS — I700 Atherosclerosis of aorta: Secondary | ICD-10-CM | POA: Insufficient documentation

## 2021-11-08 DIAGNOSIS — C342 Malignant neoplasm of middle lobe, bronchus or lung: Secondary | ICD-10-CM | POA: Insufficient documentation

## 2021-11-08 DIAGNOSIS — Z923 Personal history of irradiation: Secondary | ICD-10-CM | POA: Diagnosis not present

## 2021-11-08 LAB — GLUCOSE, CAPILLARY: Glucose-Capillary: 105 mg/dL — ABNORMAL HIGH (ref 70–99)

## 2021-11-08 MED ORDER — FLUDEOXYGLUCOSE F - 18 (FDG) INJECTION
5.2000 | Freq: Once | INTRAVENOUS | Status: AC | PRN
  Administered 2021-11-08: 5.59 via INTRAVENOUS

## 2021-11-09 ENCOUNTER — Inpatient Hospital Stay: Payer: Medicare Other

## 2021-11-09 ENCOUNTER — Other Ambulatory Visit: Payer: Self-pay | Admitting: *Deleted

## 2021-11-09 DIAGNOSIS — M899 Disorder of bone, unspecified: Secondary | ICD-10-CM

## 2021-11-09 DIAGNOSIS — C342 Malignant neoplasm of middle lobe, bronchus or lung: Secondary | ICD-10-CM

## 2021-11-09 NOTE — Progress Notes (Signed)
Patient called for Port placement 10/17, spoke with patient, to arrive at 1230, NPO after MN prior to procedure, and driver post procedure/recovery/discharge. Stated understanding.

## 2021-11-10 ENCOUNTER — Ambulatory Visit
Admission: RE | Admit: 2021-11-10 | Discharge: 2021-11-10 | Disposition: A | Discharge status: Home / Self Care | Payer: Medicare Other | Source: Ambulatory Visit | Attending: Oncology | Admitting: Oncology

## 2021-11-10 DIAGNOSIS — C342 Malignant neoplasm of middle lobe, bronchus or lung: Secondary | ICD-10-CM | POA: Diagnosis present

## 2021-11-10 MED ORDER — GADOBUTROL 1 MMOL/ML IV SOLN
3.0000 mL | Freq: Once | INTRAVENOUS | Status: AC | PRN
  Administered 2021-11-10: 7.5 mL via INTRAVENOUS

## 2021-11-12 ENCOUNTER — Other Ambulatory Visit (HOSPITAL_COMMUNITY): Payer: Self-pay | Admitting: Radiology

## 2021-11-12 DIAGNOSIS — C342 Malignant neoplasm of middle lobe, bronchus or lung: Secondary | ICD-10-CM

## 2021-11-13 ENCOUNTER — Other Ambulatory Visit (HOSPITAL_COMMUNITY): Payer: Self-pay | Admitting: Radiology

## 2021-11-13 ENCOUNTER — Inpatient Hospital Stay (HOSPITAL_BASED_OUTPATIENT_CLINIC_OR_DEPARTMENT_OTHER): Payer: Medicare Other | Admitting: Oncology

## 2021-11-13 DIAGNOSIS — Z7189 Other specified counseling: Secondary | ICD-10-CM | POA: Diagnosis not present

## 2021-11-13 DIAGNOSIS — C342 Malignant neoplasm of middle lobe, bronchus or lung: Secondary | ICD-10-CM

## 2021-11-13 MED ORDER — ONDANSETRON HCL 8 MG PO TABS: 8.0000 mg | ORAL_TABLET | Freq: Three times a day (TID) | ORAL | 1 refills | Status: DC | PRN

## 2021-11-13 MED ORDER — PROCHLORPERAZINE MALEATE 10 MG PO TABS: 10.0000 mg | ORAL_TABLET | Freq: Four times a day (QID) | ORAL | 1 refills | Status: DC | PRN

## 2021-11-13 MED ORDER — DEXAMETHASONE 4 MG PO TABS: ORAL_TABLET | ORAL | 1 refills | Status: DC

## 2021-11-13 MED ORDER — LIDOCAINE-PRILOCAINE 2.5-2.5 % EX CREA: TOPICAL_CREAM | CUTANEOUS | 3 refills | Status: DC

## 2021-11-13 NOTE — Progress Notes (Signed)
START OFF PATHWAY REGIMEN - Non-Small Cell Lung   [id number]:Carboplatin AUC 2 + Paclitaxel 50 mg/m2 weekly + RT:   Administer weekly during RT:     Paclitaxel      Carboplatin   **Always confirm dose/schedule in your pharmacy ordering system**  Patient Characteristics: Stage IV Metastatic, Nonsquamous, Awaiting Molecular Test Results and Need to Start Chemotherapy, PS = 0, 1 Therapeutic Status: Stage IV Metastatic Histology: Nonsquamous Cell Broad Molecular Profiling Status: Awaiting Molecular Test Results and Need to Start Chemotherapy ECOG Performance Status: 1 Intent of Therapy: Non-Curative / Palliative Intent, Discussed with Patient

## 2021-11-13 NOTE — Progress Notes (Signed)
Pt states she is still experiencing lots of pain and is taking the medication the right way. Will like to discuss if pt is able to take two whenever needed. As well as, will like for Dr. Janese Banks to look at the ingredients in a non-formulary supplement she has started taking.

## 2021-11-13 NOTE — Progress Notes (Signed)
Patient on plan of care prior to pathways. 

## 2021-11-13 NOTE — Progress Notes (Signed)
I connected with Andreas Ohm on 11/13/21 at  8:30 AM EDT by video enabled telemedicine visit and verified that I am speaking with the correct person using two identifiers.   I discussed the limitations, risks, security and privacy concerns of performing an evaluation and management service by telemedicine and the availability of in-person appointments. I also discussed with the patient that there may be a patient responsible charge related to this service. The patient expressed understanding and agreed to proceed.  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  Low dose chest CT on 09/04/2018 revealed multiple small pulmonary nodules are noted in the lungs bilaterally. In addition, there was a 1.53 cm concerning nodule in the right lower lobe. There was diffuse bronchial wall thickening with mild centrilobular and paraseptal emphysema; imaging findings suggestive of underlying COPD.PET scan on 09/16/2018 revealed low level FDG uptake associated with the right lower lobe lung nodule (SUV 1.57). This was a nonspecific finding. Given the size of this nodule and the low level uptake, findings may reflect inflammatory or infectious nodule. Malignancy was not entirely excluded.  Low dose chest CT on 12/04/2018 revealed a 15.3 mm RLL nodule unchanged in size.   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  There were some ground-glass attenuation components, but is predominantly solid in appearance with some internal air bronchograms, with macrolobulated and spiculated borders, clearly  increased in size.   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  Tumor was positive for CK7 and TTF-1 and negative for p40.  She is not a surgical candidate.   She received 6000 cGy SBRT to the RLL from 08/05/2019 - 08/17/2019.   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  History of iron deficiency anemia and she has undergone work-up in the past.  She has a history of gastric bypass surgery.   Patient hadCT scan of breast 2023 which showed narrowing and occlusion of the right middle lobe bronchus which was looking more prominent as compared to prior scans.  There was also concern for paratracheal adenopathy which had increased in size as compared to prior scans.  Patient was seen by pulmonary and underwent bronchoscopy guided biopsy.  Bronchial biopsies of the right middle lobe did not show any malignancy however cytology from station 7 and station 4R lymph node was consistent with adenocarcinoma.  Interval history she has some ongoing right chest wall pain for which she takes as needed oxycodone.  Denies any new complaints at this time   Review of Systems  Constitutional:  Positive for malaise/fatigue. Negative for chills, fever and weight loss.  HENT:  Negative for congestion, ear discharge and nosebleeds.   Eyes:  Negative for blurred vision.  Respiratory:  Negative for cough, hemoptysis, sputum production, shortness of breath and wheezing.        Right chest wall pain  Cardiovascular:  Negative for chest pain, palpitations, orthopnea and claudication.  Gastrointestinal:  Negative for abdominal pain, blood in stool, constipation, diarrhea, heartburn, melena, nausea and vomiting.  Genitourinary:  Negative for dysuria,  flank pain, frequency, hematuria and urgency.  Musculoskeletal:  Negative for back pain, joint pain and myalgias.  Skin:  Negative for rash.  Neurological:  Negative for dizziness,  tingling, focal weakness, seizures, weakness and headaches.  Endo/Heme/Allergies:  Does not bruise/bleed easily.  Psychiatric/Behavioral:  Negative for depression and suicidal ideas. The patient does not have insomnia.     Allergies  Allergen Reactions   Hydromorphone Hcl Itching   Bentyl [Dicyclomine] Nausea And Vomiting   Biaxin [Clarithromycin] Other (See Comments)    hallucinations   Budesonide Other (See Comments)    Ulcer   Erythromycin Nausea And Vomiting   Reglan [Metoclopramide] Other (See Comments)    tremors    Past Medical History:  Diagnosis Date   Arthritis    hands   Avascular necrosis of hip, right ([place])    Blepharospasm    Cancer ([place]) 2011   Right Upper Lobe Lobectomy   Chronic diarrhea    Chronic diarrhea    Collagenous colitis    Complication of anesthesia    usually wakes up during procedures  (endoscopy and colonoscopy)   COPD (chronic obstructive pulmonary disease) ([place])    COVID 2022   Depression    Diverticulosis    Gastric outlet obstruction    Headache    every couple of days   Hemorrhoid    History of Crohn's disease    Hypoglycemic disorder    IDA (iron deficiency anemia)    Intestinal adhesions    Multiple gastric ulcers    Multiple gastric ulcers    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[place])    Stroke ([place]) 8 yrs ago   double vision left eye    Past Surgical History:  Procedure Laterality Date   APPENDECTOMY  1989   botox injections for blepharospasm     BREAST SURGERY     CATARACT EXTRACTION     COLON RESECTION  2005   COLONOSCOPY  10-29-2008   [name]   COLONOSCOPY WITH PROPOFOL N/A 03/05/2017   Procedure: COLONOSCOPY WITH PROPOFOL;  Surgeon: Toledo, Benay Pike, MD;  Location: [facility];  Service: Gastroenterology;  Laterality: N/A;   COLOSTOMY REVERSAL  2006   ESOPHAGOGASTRODUODENOSCOPY (EGD) WITH PROPOFOL N/A 07/18/2016   Procedure: ESOPHAGOGASTRODUODENOSCOPY (EGD) WITH PROPOFOL;  Surgeon: Robert Bellow, MD;   Location: [facility];  Service: Endoscopy;  Laterality: N/A;   ESOPHAGOGASTRODUODENOSCOPY (EGD) WITH PROPOFOL N/A 08/03/2016   Procedure: ESOPHAGOGASTRODUODENOSCOPY (EGD) WITH PROPOFOL;  Surgeon: Robert Bellow, MD;  Location: [facility];  Service: Endoscopy;  Laterality: N/A;   ESOPHAGOGASTRODUODENOSCOPY (EGD) WITH PROPOFOL N/A 09/01/2018   Procedure: ESOPHAGOGASTRODUODENOSCOPY (EGD) WITH PROPOFOL;  Surgeon: Toledo, Benay Pike, MD;  Location: [facility];  Service: Gastroenterology;  Laterality: N/A;   ESOPHAGOGASTRODUODENOSCOPY (EGD) WITH PROPOFOL N/A 12/01/2018   Procedure: ESOPHAGOGASTRODUODENOSCOPY (EGD) WITH PROPOFOL;  Surgeon: Toledo, Benay Pike, MD;  Location: [facility];  Service: Gastroenterology;  Laterality: N/A;   ESOPHAGOGASTRODUODENOSCOPY (EGD) WITH PROPOFOL N/A 02/23/2019   Procedure: ESOPHAGOGASTRODUODENOSCOPY (EGD) WITH PROPOFOL;  Surgeon: Lucilla Lame, MD;  Location: [facility];  Service: Endoscopy;  Laterality: N/A;   ESOPHAGOSCOPY WITH DILITATION  2012,2015   [facility] [place]   EYE SURGERY     FLEXIBLE BRONCHOSCOPY N/A 10/30/2021   Procedure: FLEXIBLE BRONCHOSCOPY;  Surgeon: Armando Reichert, MD;  Location: [facility];  Service: Pulmonary;  Laterality: N/A;   GASTRECTOMY     gastric ulcer  1989, 1991   JOINT REPLACEMENT     right total hip arthroplasty 03/03/02   LAPAROSCOPIC LYSIS  OF ADHESIONS     LUNG CANCER SURGERY  2011   LYSIS OF ADHESION     PLACEMENT OF BREAST IMPLANTS  1973   thoracotomy with right upper lobectomy and central compartment node dissection     UPPER GASTROINTESTINAL ENDOSCOPY  10-29-2008   [name]   VIDEO BRONCHOSCOPY WITH ENDOBRONCHIAL ULTRASOUND N/A 10/30/2021   Procedure: VIDEO BRONCHOSCOPY WITH ENDOBRONCHIAL ULTRASOUND;  Surgeon: Armando Reichert, MD;  Location: [facility];  Service: Pulmonary;  Laterality: N/A;    Social History   Socioeconomic History   Marital status: Widowed    Spouse name: Not on file   Number of children: 2    Years of education: Not on file   Highest education level: Not on file  Occupational History   Occupation: retired    Comment: Pharmacist, hospital  Tobacco Use   Smoking status: Former    Packs/day: 1.00    Years: 25.00    Total pack years: 25.00    Types: Cigarettes    Quit date: 01/30/2008    Years since quitting: 13.7   Smokeless tobacco: Never  Vaping Use   Vaping Use: Never used  Substance and Sexual Activity   Alcohol use: Yes    Comment: wine once per month   Drug use: No   Sexual activity: Not on file  Other Topics Concern   Not on file  Social History Narrative   Lives alone    Social Determinants of Health   Financial Resource Strain: Not on file  Food Insecurity: Not on file  Transportation Needs: Not on file  Physical Activity: Not on file  Stress: Not on file  Social Connections: Not on file  Intimate Partner Violence: Not on file    No family history on file.   Current Outpatient Medications:    acetaminophen (TYLENOL) 500 MG tablet, Take 500 mg by mouth every 6 (six) hours as needed., Disp: , Rfl:    brimonidine-timolol (COMBIGAN) 0.2-0.5 % ophthalmic solution, INT 1 GTT IN OU BID, Disp: , Rfl:    Cholecalciferol (VITAMIN D3) 1000 units CAPS, Take 1 capsule by mouth daily., Disp: , Rfl:    fluticasone (FLONASE) 50 MCG/ACT nasal spray, Place into both nostrils daily., Disp: , Rfl:    loratadine (CLARITIN) 10 MG tablet, Take 10 mg by mouth daily., Disp: , Rfl:    Multiple Vitamin (MULTI-VITAMINS) TABS, Take 1 tablet by mouth daily. once daily., Disp: , Rfl:    NON FORMULARY, BONE BROTH, Disp: , Rfl:    oxyCODONE (OXY IR/ROXICODONE) 5 MG immediate release tablet, Take 1 tablet (5 mg total) by mouth 3 (three) times daily as needed for severe pain., Disp: 90 tablet, Rfl: 0   pantoprazole (PROTONIX) 40 MG tablet, Take 40 mg by mouth 2 (two) times daily. , Disp: , Rfl:    sucralfate (CARAFATE) 1 G tablet, Take 1 g by mouth 4 (four) times daily. , Disp: , Rfl:     traZODone (DESYREL) 150 MG tablet, Take 300 mg by mouth at bedtime. , Disp: , Rfl:    venlafaxine XR (EFFEXOR-XR) 150 MG 24 hr capsule, Take 150 mg by mouth daily with breakfast. , Disp: , Rfl:   MR Brain W Wo Contrast  Result Date: 11/11/2021 CLINICAL DATA:  Lung carcinoma.  SRS protocol. EXAM: MRI HEAD WITHOUT AND WITH CONTRAST TECHNIQUE: Multiplanar, multiecho pulse sequences of the brain and surrounding structures were obtained without and with intravenous contrast. CONTRAST:  7.49m GADAVIST GADOBUTROL 1 MMOL/ML IV SOLN COMPARISON:  12/24/2011 FINDINGS: Brain: No acute infarct, mass effect or extra-axial collection. Multifocal chronic microhemorrhage in the posterior right hemisphere. There is multifocal hyperintense T2-weighted signal within the white matter. Parenchymal volume and CSF spaces are normal. The midline structures are normal. Vascular: Major flow voids are preserved. Skull and upper cervical spine: Normal calvarium and skull base. Visualized upper cervical spine and soft tissues are normal. Sinuses/Orbits:No paranasal sinus fluid levels or advanced mucosal thickening. No mastoid or middle ear effusion. Normal orbits. IMPRESSION: No intracranial metastatic disease. Electronically Signed   By: Ulyses Jarred M.D.   On: 11/11/2021 21:10   NM PET Image Initial (PI) Skull Base To Thigh  Result Date: 11/09/2021 CLINICAL DATA:  Subsequent treatment strategy for lung cancer. EXAM: NUCLEAR MEDICINE PET SKULL BASE TO THIGH TECHNIQUE: 5.59 mCi F-18 FDG was injected intravenously. Full-ring PET imaging was performed from the skull base to thigh after the radiotracer. CT data was obtained and used for attenuation correction and anatomic localization. Fasting blood glucose: 105 mg/dl COMPARISON:  Multiple prior chest CTs and a prior PET-CT from 2020 FINDINGS: Mediastinal blood pool activity: SUV max 1.38 Liver activity: SUV max NA NECK: No hypermetabolic lymph nodes in the neck. Incidental CT findings:  None. CHEST: Soft tissue thickening with compression and narrowing of the bronchus intermedius demonstrates moderate hypermetabolism with SUV max of 4.72. Findings consistent recurrent tumor. There is also a 9 mm right paratracheal node which is hypermetabolic. SUV max is 3.79. The crescentic density the in the right lower lobe is stable and has the appearance of post radiation changes. No hypermetabolism to suggest recurrent tumor in this area. The left lower lobe ground-glass nodule is difficult to see on this study. No hypermetabolism is demonstrated. Incidental CT findings: Stable surgical changes from a right upper lobe lobectomy with marked pleural thickening anteriorly. Stable underlying severe emphysematous changes and pulmonary scarring. Stable aortic and coronary artery calcifications. Bilateral rim calcified breast prostheses. ABDOMEN/PELVIS: No abnormal hypermetabolic activity within the liver, pancreas, adrenal glands, or spleen. No hypermetabolic lymph nodes in the abdomen or pelvis. Incidental CT findings: Stable advanced vascular calcifications. Stable surgical changes from anterior abdominal wall hernia repair. Large duodenal diverticulum. Evidence of previous bowel surgery in the left side. SKELETON: There is a lytic bone lesions involving the transverse process of T2 on the right side. This demonstrates hypermetabolism with SUV max of 4.29 and is consistent with a new bone metastasis. No other bone lesions are identified. Incidental CT findings: None. IMPRESSION: 1. PET-CT findings consistent with recurrent tumor surrounding and compressing the bronchus intermedius. There is also a hypermetabolic right paratracheal lymph node. 2. New lytic bone lesion involving the transverse process of T2 on the right side is hypermetabolic and consistent with a new bone metastasis. 3. Stable post radiation changes involving the right lower lobe lower lobe. 4. The left lower lobe ground-glass nodule is not well  seen on this study and no hypermetabolism is demonstrated. Recommend attention on follow-up chest CTs. 5. Stable surgical changes from a right upper lobe lobectomy. 6. Stable severe emphysematous changes and pulmonary scarring. 7. Stable advanced atherosclerotic calcifications involving the thoracic and abdominal aorta and branch vessels. 8. Aortic atherosclerosis. Aortic Atherosclerosis (ICD10-I70.0) and Emphysema (ICD10-J43.9). Electronically Signed   By: Marijo Sanes M.D.   On: 11/09/2021 11:06   DG Chest Port 1 View  Result Date: 10/30/2021 CLINICAL DATA:  Status post bronchoscopy with biopsy. EXAM: PORTABLE CHEST 1 VIEW COMPARISON:  Radiographs 06/13/2020 and 06/17/2019. Chest CT 09/13/2021. FINDINGS: No  recent radiographs are available for correlation. There are postsurgical changes in the right hemithorax status post upper lobectomy. The degree of volume loss appears increased compared with the most recent CT, probably related to associated atelectasis. Right perihilar consolidation is grossly unchanged. The left lung is clear. There is no evidence of pneumothorax or significant pleural effusion. The heart size and mediastinal contours are stable. Bilateral breast implants are noted. IMPRESSION: 1. No evidence of pneumothorax following bronchoscopy. 2. Increased volume loss in the right hemithorax status post upper lobectomy, probably related to associated atelectasis. Electronically Signed   By: Richardean Sale M.D.   On: 10/30/2021 14:46    No images are attached to the encounter.      Latest Ref Rng & Units 10/24/2021   12:45 PM  CMP  Glucose 70 - 99 mg/dL 102   BUN 8 - 23 mg/dL 22   Creatinine 0.44 - 1.00 mg/dL 0.63   Sodium 135 - 145 mmol/L 138   Potassium 3.5 - 5.1 mmol/L 3.7   Chloride 98 - 111 mmol/L 97   CO2 22 - 32 mmol/L 28   Calcium 8.9 - 10.3 mg/dL 8.4       Latest Ref Rng & Units 10/24/2021   12:45 PM  CBC  WBC 4.0 - 10.5 K/uL 7.8   Hemoglobin 12.0 - 15.0 g/dL 12.7    Hematocrit 36.0 - 46.0 % 40.2   Platelets 150 - 400 K/uL 397      Observation/objective: Appears in no acute distress over video visit today.  Breathing is nonlabored  Assessment and plan: Patient is a32 year old female who with history of non-small cell lung cancer involving the right middle lobe stage IV T1b N2 M1 here to discuss PET scan results and further management  I have reviewed PET CT scan images independently and discussed findings with the patient.  Patient was found to have an isolated hypermetabolic area in the T2 vertebral body concerning for metastatic disease.  I discussed this findings with the patient.  I also discussed her case with Dr. Baruch Gouty from radiation oncology.  We are going to presume that this lesion is potentially metastatic.  However given that she has an ongoing right middle lobe obstruction and otherwise evidence of local regional disease I will plan to treat her with weekly CarboTaxol concurrent with radiation as previously planned.  She will also be receiving SBRT to her T2 lesion.  MRI brain was otherwise negative for metastatic disease.  I will discuss bisphosphonates with her at my next visit.  Chemotherapy with weekly CarboTaxol will start based on when radiation will begin.  I will plan to send NGS testing on her tumor specimen and decide about further course of treatment after concurrent chemoradiation once results are back  Follow-up instructions: As above  I discussed the assessment and treatment plan with the patient. The patient was provided an opportunity to ask questions and all were answered. The patient agreed with the plan and demonstrated an understanding of the instructions.   The patient was advised to call back or seek an in-person evaluation if the symptoms worsen or if the condition fails to improve as anticipated.  I provided 16 minutes of face-to-face video visit time during this encounter, and > 50% was spent counseling as documented  under my assessment & plan.  Visit Diagnosis: 1. Non-small cell cancer of middle lobe of right lung ([place])   2. Goals of care, counseling/discussion     Dr. Randa Evens, MD, MPH [facility]  at [facility] Tel- [phone] 11/13/2021 12:35 PM

## 2021-11-14 ENCOUNTER — Encounter: Payer: Self-pay | Admitting: *Deleted

## 2021-11-14 ENCOUNTER — Other Ambulatory Visit: Payer: Self-pay

## 2021-11-14 ENCOUNTER — Ambulatory Visit
Admission: RE | Admit: 2021-11-14 | Discharge: 2021-11-14 | Disposition: A | Discharge status: Home / Self Care | Payer: Medicare Other | Source: Ambulatory Visit | Attending: Radiation Oncology | Admitting: Radiation Oncology

## 2021-11-14 ENCOUNTER — Ambulatory Visit
Admission: RE | Admit: 2021-11-14 | Discharge: 2021-11-14 | Disposition: A | Discharge status: Home / Self Care | Payer: Medicare Other | Source: Ambulatory Visit | Attending: Oncology | Admitting: Oncology

## 2021-11-14 ENCOUNTER — Other Ambulatory Visit: Payer: Self-pay | Admitting: Oncology

## 2021-11-14 DIAGNOSIS — Z8673 Personal history of transient ischemic attack (TIA), and cerebral infarction without residual deficits: Secondary | ICD-10-CM | POA: Diagnosis not present

## 2021-11-14 DIAGNOSIS — Z87891 Personal history of nicotine dependence: Secondary | ICD-10-CM | POA: Insufficient documentation

## 2021-11-14 DIAGNOSIS — C342 Malignant neoplasm of middle lobe, bronchus or lung: Secondary | ICD-10-CM | POA: Diagnosis present

## 2021-11-14 DIAGNOSIS — J449 Chronic obstructive pulmonary disease, unspecified: Secondary | ICD-10-CM | POA: Diagnosis not present

## 2021-11-14 DIAGNOSIS — Z5111 Encounter for antineoplastic chemotherapy: Secondary | ICD-10-CM | POA: Diagnosis not present

## 2021-11-14 HISTORY — PX: IR IMAGING GUIDED PORT INSERTION: IMG5740

## 2021-11-14 MED ORDER — FENTANYL CITRATE (PF) 100 MCG/2ML IJ SOLN
INTRAMUSCULAR | Status: AC | PRN
  Administered 2021-11-14: 50 ug via INTRAVENOUS
  Administered 2021-11-14: 25 ug via INTRAVENOUS

## 2021-11-14 MED ORDER — MIDAZOLAM HCL 2 MG/2ML IJ SOLN
INTRAMUSCULAR | Status: AC | PRN
  Administered 2021-11-14: .5 mg via INTRAVENOUS

## 2021-11-14 MED ORDER — LORAZEPAM 2 MG/ML IJ SOLN
INTRAMUSCULAR | Status: AC | PRN
  Administered 2021-11-14: 1 mg via INTRAVENOUS

## 2021-11-14 MED ORDER — LIDOCAINE-EPINEPHRINE 1 %-1:100000 IJ SOLN
INTRAMUSCULAR | Status: AC
  Administered 2021-11-14: 20 mL
  Filled 2021-11-14: qty 1

## 2021-11-14 MED ORDER — SODIUM CHLORIDE 0.9 % IV SOLN: INTRAVENOUS | Status: DC

## 2021-11-14 MED ORDER — HEPARIN SOD (PORK) LOCK FLUSH 100 UNIT/ML IV SOLN
INTRAVENOUS | Status: AC
  Administered 2021-11-14: 500 [IU]
  Filled 2021-11-14: qty 5

## 2021-11-14 MED ORDER — HEPARIN SOD (PORK) LOCK FLUSH 100 UNIT/ML IV SOLN
INTRAVENOUS | Status: AC
  Filled 2021-11-14: qty 5

## 2021-11-14 MED ORDER — MIDAZOLAM HCL 2 MG/2ML IJ SOLN
INTRAMUSCULAR | Status: AC
  Filled 2021-11-14: qty 2

## 2021-11-14 MED ORDER — FENTANYL CITRATE (PF) 100 MCG/2ML IJ SOLN
INTRAMUSCULAR | Status: AC
  Filled 2021-11-14: qty 2

## 2021-11-14 MED ORDER — LIDOCAINE-EPINEPHRINE (PF) 2 %-1:200000 IJ SOLN
INTRAMUSCULAR | Status: AC
  Filled 2021-11-14: qty 20

## 2021-11-14 NOTE — Procedures (Signed)
Interventional Radiology Procedure Note ° °Procedure: Single Lumen Power Port Placement   ° °Access:  Right internal jugular vein ° °Findings: Catheter tip positioned at cavoatrial junction. Port is ready for immediate use.  ° °Complications: None ° °EBL: < 10 mL ° °Recommendations:  °- Ok to shower in 24 hours °- Do not submerge for 7 days °- Routine line care  ° ° °Stephannie Broner, MD ° ° ° °

## 2021-11-14 NOTE — Progress Notes (Signed)
Request for [facility] faxed to pathology to send out.

## 2021-11-14 NOTE — Progress Notes (Signed)
Patient clinically stable post Port placement per Dr Serafina Royals, awake entire procedure despite meds given. As per rr no further meds given. Vitals stable. Received Versed 1.5 mg along with Fentanyl 75 mcg IV for procedure.report given to [facility] RN post procedure at bedside. Denies complaints at present time.

## 2021-11-14 NOTE — H&P (Signed)
Chief Complaint: Patient was seen in consultation today for image-guided port-a-catheter placement   Referring Physician(s): Rao,Archana C  Supervising Physician: Ruthann Cancer  Patient Status: [facility] - Out-pt  History of Present Illness: Carol Shaffer is a 76 y.o. female with PMH of stroke, headache, COPD, gastric outlet obstruction, and lung cancer being seen today for image-guided port-a-catheter placement. Of note, the patient has a history of waking up during sedation procedures with this occurring during past endoscopy and colonoscopy procedures. The patient  has a history of lung cancer s/p right upper lobe resection in 2012. The patient has been followed by oncology since that time, and CT Chest in August 2023 revealed narrowing of the right middle lobe bronchus. Subsequent bronchoscopy revealed cytology consistent with adenocarcinoma. Patient was referred to IR for port placement for chemotherapy.  Past Medical History:  Diagnosis Date   Arthritis    hands   Avascular necrosis of hip, right ([place])    Blepharospasm    Cancer ([place]) 2011   Right Upper Lobe Lobectomy   Chronic diarrhea    Chronic diarrhea    Collagenous colitis    Complication of anesthesia    usually wakes up during procedures  (endoscopy and colonoscopy)   COPD (chronic obstructive pulmonary disease) ([place])    COVID 2022   Depression    Diverticulosis    Gastric outlet obstruction    Headache    every couple of days   Hemorrhoid    History of Crohn's disease    Hypoglycemic disorder    IDA (iron deficiency anemia)    Intestinal adhesions    Multiple gastric ulcers    Multiple gastric ulcers    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[place])    Stroke ([place]) 8 yrs ago   double vision left eye    Past Surgical History:  Procedure Laterality Date   APPENDECTOMY  1989   botox injections for blepharospasm     BREAST SURGERY     CATARACT EXTRACTION     COLON RESECTION  2005   COLONOSCOPY  10-29-2008    [name]   COLONOSCOPY WITH PROPOFOL N/A 03/05/2017   Procedure: COLONOSCOPY WITH PROPOFOL;  Surgeon: Toledo, Benay Pike, MD;  Location: [facility] ENDOSCOPY;  Service: Gastroenterology;  Laterality: N/A;   COLOSTOMY REVERSAL  2006   ESOPHAGOGASTRODUODENOSCOPY (EGD) WITH PROPOFOL N/A 07/18/2016   Procedure: ESOPHAGOGASTRODUODENOSCOPY (EGD) WITH PROPOFOL;  Surgeon: Robert Bellow, MD;  Location: [facility] ENDOSCOPY;  Service: Endoscopy;  Laterality: N/A;   ESOPHAGOGASTRODUODENOSCOPY (EGD) WITH PROPOFOL N/A 08/03/2016   Procedure: ESOPHAGOGASTRODUODENOSCOPY (EGD) WITH PROPOFOL;  Surgeon: Robert Bellow, MD;  Location: [facility] ENDOSCOPY;  Service: Endoscopy;  Laterality: N/A;   ESOPHAGOGASTRODUODENOSCOPY (EGD) WITH PROPOFOL N/A 09/01/2018   Procedure: ESOPHAGOGASTRODUODENOSCOPY (EGD) WITH PROPOFOL;  Surgeon: Toledo, Benay Pike, MD;  Location: [facility] ENDOSCOPY;  Service: Gastroenterology;  Laterality: N/A;   ESOPHAGOGASTRODUODENOSCOPY (EGD) WITH PROPOFOL N/A 12/01/2018   Procedure: ESOPHAGOGASTRODUODENOSCOPY (EGD) WITH PROPOFOL;  Surgeon: Toledo, Benay Pike, MD;  Location: [facility] END[facility];  Service: Gastroenterology;  Laterality: N/A;   ESOPHAGOGASTRODUODENOSCOPY (EGD) WITH PROPOFOL N/A 02/23/2019   Procedure: ESOPHAGOGASTRODUODENOSCOPY (EGD) WITH PROPOFOL;  Surgeon: Lucilla Lame, MD;  Location: [place];  Service: Endoscopy;  Laterality: N/A;   ESOPHAGOSCOPY WITH DILITATION  2012,2015   [facility] [place]   EYE SURGERY     FLEXIBLE BRONCHOSCOPY N/A 10/30/2021   Procedure: FLEXIBLE BRONCHOSCOPY;  Surgeon: Armando Reichert, MD;  Location: [facility] ORS;  Service: Pulmonary;  Laterality: N/A;   GASTRECTOMY  gastric ulcer  1989, 1991   JOINT REPLACEMENT     right total hip arthroplasty 03/03/02   LAPAROSCOPIC LYSIS OF ADHESIONS     LUNG CANCER SURGERY  2011   LYSIS OF ADHESION     PLACEMENT OF BREAST IMPLANTS  1973   thoracotomy with right upper lobectomy and central compartment node dissection     UPPER  GASTROINTESTINAL ENDOSCOPY  10-29-2008   [name]   VIDEO BRONCHOSCOPY WITH ENDOBRONCHIAL ULTRASOUND N/A 10/30/2021   Procedure: VIDEO BRONCHOSCOPY WITH ENDOBRONCHIAL ULTRASOUND;  Surgeon: Armando Reichert, MD;  Location: [facility] ORS;  Service: Pulmonary;  Laterality: N/A;    Allergies: Hydromorphone hcl, Bentyl [dicyclomine], Biaxin [clarithromycin], Budesonide, Erythromycin, and Reglan [metoclopramide]  Medications: Prior to Admission medications   Medication Sig Start Date End Date Taking? Authorizing Provider  acetaminophen (TYLENOL) 500 MG tablet Take 500 mg by mouth every 6 (six) hours as needed.    [provider]  brimonidine-timolol (COMBIGAN) 0.2-0.5 % ophthalmic solution INT 1 GTT IN OU BID 10/29/17   [provider]  Cholecalciferol (VITAMIN D3) 1000 units CAPS Take 1 capsule by mouth daily.    [provider]  dexamethasone (DECADRON) 4 MG tablet Take 2 tablets daily for 2 days, start the day after chemotherapy. Take with food. 11/13/21   Sindy Guadeloupe, MD  fluticasone (FLONASE) 50 MCG/ACT nasal spray Place into both nostrils daily.    [provider]  lidocaine-prilocaine (EMLA) cream Apply to affected area once 11/13/21   Sindy Guadeloupe, MD  loratadine (CLARITIN) 10 MG tablet Take 10 mg by mouth daily.    [provider]  Multiple Vitamin (MULTI-VITAMINS) TABS Take 1 tablet by mouth daily. once daily.    [provider]  NON [place]    [provider]  ondansetron (ZOFRAN) 8 MG tablet Take 1 tablet (8 mg total) by mouth every 8 (eight) hours as needed for nausea or vomiting. Start on the third day after chemotherapy. 11/13/21   Sindy Guadeloupe, MD  oxyCODONE (OXY IR/ROXICODONE) 5 MG immediate release tablet Take 1 tablet (5 mg total) by mouth 3 (three) times daily as needed for severe pain. 11/03/21   Sindy Guadeloupe, MD  pantoprazole (PROTONIX) 40 MG tablet Take 40 mg by mouth 2 (two) times daily.  10/28/13    [provider]  prochlorperazine (COMPAZINE) 10 MG tablet Take 1 tablet (10 mg total) by mouth every 6 (six) hours as needed for nausea or vomiting. 11/13/21   Sindy Guadeloupe, MD  sucralfate (CARAFATE) 1 G tablet Take 1 g by mouth 4 (four) times daily.  10/28/13   [provider]  traZODone (DESYREL) 150 MG tablet Take 300 mg by mouth at bedtime.  10/28/13   [provider]  venlafaxine XR (EFFEXOR-XR) 150 MG 24 hr capsule Take 150 mg by mouth daily with breakfast.  10/28/13   [provider]     No family history on file.  Social History   Socioeconomic History   Marital status: Widowed    Spouse name: Not on file   Number of children: 2   Years of education: Not on file   Highest education level: Not on file  Occupational History   Occupation: retired    Comment: Pharmacist, hospital  Tobacco Use   Smoking status: Former    Packs/day: 1.00    Years: 25.00    Total pack years: 25.00    Types: Cigarettes    Quit date: 01/30/2008  Years since quitting: 13.8   Smokeless tobacco: Never  Vaping Use   Vaping Use: Never used  Substance and Sexual Activity   Alcohol use: Yes    Comment: wine once per month   Drug use: No   Sexual activity: Not on file  Other Topics Concern   Not on file  Social History Narrative   Lives alone    Social Determinants of Health   Financial Resource Strain: Not on file  Food Insecurity: Not on file  Transportation Needs: Not on file  Physical Activity: Not on file  Stress: Not on file  Social Connections: Not on file    Review of Systems: A 12 point ROS discussed and pertinent positives are indicated in the HPI above.  All other systems are negative.  Review of Systems  Constitutional:  Negative for chills and fever.  Respiratory:  Positive for shortness of breath. Negative for chest tightness.   Cardiovascular:  Negative for chest pain and leg swelling.  Gastrointestinal:  Positive for abdominal pain. Negative for  diarrhea, nausea and vomiting.  Neurological:  Negative for dizziness, light-headedness and headaches.  Psychiatric/Behavioral:  Negative for confusion.     Vital Signs: BP (!) 144/76   Pulse 98   Temp 98.2 F (36.8 C) (Oral)   Resp 20   Ht 5' (1.524 m)   Wt 80 lb (36.3 kg)   SpO2 99%   BMI 15.62 kg/m     Physical Exam Vitals reviewed.  Constitutional:      General: She is not in acute distress. HENT:     Mouth/Throat:     Mouth: Mucous membranes are moist.  Cardiovascular:     Rate and Rhythm: Normal rate and regular rhythm.     Pulses: Normal pulses.     Heart sounds: Normal heart sounds.  Pulmonary:     Effort: Pulmonary effort is normal. No respiratory distress.     Breath sounds: Wheezing present.  Abdominal:     General: Abdomen is flat. Bowel sounds are normal.     Palpations: Abdomen is soft.     Tenderness: There is no abdominal tenderness.  Musculoskeletal:     Right lower leg: No edema.     Left lower leg: No edema.  Skin:    General: Skin is warm and dry.  Neurological:     Mental Status: She is alert and oriented to person, place, and time.  Psychiatric:        Mood and Affect: Mood normal.        Behavior: Behavior normal.        Thought Content: Thought content normal.        Judgment: Judgment normal.     Imaging: MR Brain W Wo Contrast  Result Date: 11/11/2021 CLINICAL DATA:  Lung carcinoma.  SRS protocol. EXAM: MRI HEAD WITHOUT AND WITH CONTRAST TECHNIQUE: Multiplanar, multiecho pulse sequences of the brain and surrounding structures were obtained without and with intravenous contrast. CONTRAST:  7.72m GADAVIST GADOBUTROL 1 MMOL/ML IV SOLN COMPARISON:  12/24/2011 FINDINGS: Brain: No acute infarct, mass effect or extra-axial collection. Multifocal chronic microhemorrhage in the posterior right hemisphere. There is multifocal hyperintense T2-weighted signal within the white matter. Parenchymal volume and CSF spaces are normal. The midline  structures are normal. Vascular: Major flow voids are preserved. Skull and upper cervical spine: Normal calvarium and skull base. Visualized upper cervical spine and soft tissues are normal. Sinuses/Orbits:No paranasal sinus fluid levels or advanced mucosal thickening. No mastoid or middle  ear effusion. Normal orbits. IMPRESSION: No intracranial metastatic disease. Electronically Signed   By: Ulyses Jarred M.D.   On: 11/11/2021 21:10   NM PET Image Initial (PI) Skull Base To Thigh  Result Date: 11/09/2021 CLINICAL DATA:  Subsequent treatment strategy for lung cancer. EXAM: NUCLEAR MEDICINE PET SKULL BASE TO THIGH TECHNIQUE: 5.59 mCi F-18 FDG was injected intravenously. Full-ring PET imaging was performed from the skull base to thigh after the radiotracer. CT data was obtained and used for attenuation correction and anatomic localization. Fasting blood glucose: 105 mg/dl COMPARISON:  Multiple prior chest CTs and a prior PET-CT from 2020 FINDINGS: Mediastinal blood pool activity: SUV max 1.38 Liver activity: SUV max NA NECK: No hypermetabolic lymph nodes in the neck. Incidental CT findings: None. CHEST: Soft tissue thickening with compression and narrowing of the bronchus intermedius demonstrates moderate hypermetabolism with SUV max of 4.72. Findings consistent recurrent tumor. There is also a 9 mm right paratracheal node which is hypermetabolic. SUV max is 3.79. The crescentic density the in the right lower lobe is stable and has the appearance of post radiation changes. No hypermetabolism to suggest recurrent tumor in this area. The left lower lobe ground-glass nodule is difficult to see on this study. No hypermetabolism is demonstrated. Incidental CT findings: Stable surgical changes from a right upper lobe lobectomy with marked pleural thickening anteriorly. Stable underlying severe emphysematous changes and pulmonary scarring. Stable aortic and coronary artery calcifications. Bilateral rim calcified breast  prostheses. ABDOMEN/PELVIS: No abnormal hypermetabolic activity within the liver, pancreas, adrenal glands, or spleen. No hypermetabolic lymph nodes in the abdomen or pelvis. Incidental CT findings: Stable advanced vascular calcifications. Stable surgical changes from anterior abdominal wall hernia repair. Large duodenal diverticulum. Evidence of previous bowel surgery in the left side. SKELETON: There is a lytic bone lesions involving the transverse process of T2 on the right side. This demonstrates hypermetabolism with SUV max of 4.29 and is consistent with a new bone metastasis. No other bone lesions are identified. Incidental CT findings: None. IMPRESSION: 1. PET-CT findings consistent with recurrent tumor surrounding and compressing the bronchus intermedius. There is also a hypermetabolic right paratracheal lymph node. 2. New lytic bone lesion involving the transverse process of T2 on the right side is hypermetabolic and consistent with a new bone metastasis. 3. Stable post radiation changes involving the right lower lobe lower lobe. 4. The left lower lobe ground-glass nodule is not well seen on this study and no hypermetabolism is demonstrated. Recommend attention on follow-up chest CTs. 5. Stable surgical changes from a right upper lobe lobectomy. 6. Stable severe emphysematous changes and pulmonary scarring. 7. Stable advanced atherosclerotic calcifications involving the thoracic and abdominal aorta and branch vessels. 8. Aortic atherosclerosis. Aortic Atherosclerosis (ICD10-I70.0) and Emphysema (ICD10-J43.9). Electronically Signed   By: Marijo Sanes M.D.   On: 11/09/2021 11:06   DG Chest Port 1 View  Result Date: 10/30/2021 CLINICAL DATA:  Status post bronchoscopy with biopsy. EXAM: PORTABLE CHEST 1 VIEW COMPARISON:  Radiographs 06/13/2020 and 06/17/2019. Chest CT 09/13/2021. FINDINGS: No recent radiographs are available for correlation. There are postsurgical changes in the right hemithorax status  post upper lobectomy. The degree of volume loss appears increased compared with the most recent CT, probably related to associated atelectasis. Right perihilar consolidation is grossly unchanged. The left lung is clear. There is no evidence of pneumothorax or significant pleural effusion. The heart size and mediastinal contours are stable. Bilateral breast implants are noted. IMPRESSION: 1. No evidence of pneumothorax following bronchoscopy. 2.  Increased volume loss in the right hemithorax status post upper lobectomy, probably related to associated atelectasis. Electronically Signed   By: Richardean Sale M.D.   On: 10/30/2021 14:46    Labs:  CBC: Recent Labs    03/13/21 1319 09/15/21 1259 10/24/21 1245  WBC 6.0 7.8 7.8  HGB 13.0 13.2 12.7  HCT 41.0 42.5 40.2  PLT 298 378 397    COAGS: Recent Labs    10/24/21 1245  INR 0.9  APTT 32    BMP: Recent Labs    10/24/21 1245  NA 138  K 3.7  CL 97*  CO2 28  GLUCOSE 102*  BUN 22  CALCIUM 8.4*  CREATININE 0.63  GFRNONAA >60    LIVER FUNCTION TESTS: No results for input(s): "BILITOT", "AST", "ALT", "ALKPHOS", "PROT", "ALBUMIN" in the last 8760 hours.  TUMOR MARKERS: No results for input(s): "AFPTM", "CEA", "CA199", "CHROMGRNA" in the last 8760 hours.  Assessment and Plan:  Chellie Vanlue is a 76 yo female with PMH of lung cancer being seen today for image-guided port-a-catheter placement with moderate sedation. Procedure is scheduled for 11/14/21 with Dr Serafina Royals.  Risks and benefits of image guided port-a-catheter placement was discussed with the patient including, but not limited to bleeding, infection, pneumothorax, or fibrin sheath development and need for additional procedures.  All of the patient's questions were answered, patient is agreeable to proceed. Consent signed and in chart.   Thank you for this interesting consult.  I greatly enjoyed meeting AUNDRIA BITTERMAN and look forward to participating in their care.  A copy  of this report was sent to the requesting provider on this date.  Electronically Signed: Lura Em, PA 11/14/2021, 1:33 PM   I spent a total of  30 Minutes   in face to face in clinical consultation, greater than 50% of which was counseling/coordinating care for image-guided port-a-catheter placement.

## 2021-11-16 DIAGNOSIS — Z5111 Encounter for antineoplastic chemotherapy: Secondary | ICD-10-CM | POA: Diagnosis not present

## 2021-11-17 LAB — GLUCOSE, CAPILLARY: Glucose-Capillary: 97 mg/dL (ref 70–99)

## 2021-11-20 ENCOUNTER — Other Ambulatory Visit: Payer: Self-pay

## 2021-11-22 ENCOUNTER — Ambulatory Visit: Payer: Medicare Other

## 2021-11-22 ENCOUNTER — Telehealth: Payer: Self-pay

## 2021-11-22 NOTE — Telephone Encounter (Signed)
PA for Lidocaine-Prilocaine cream has been initiated via phone with Tiffany from [facility]. Per Tiffany we will recieve a decision within the next 48 hours.

## 2021-11-23 ENCOUNTER — Inpatient Hospital Stay: Payer: Medicare Other

## 2021-11-23 ENCOUNTER — Ambulatory Visit: Admission: RE | Admit: 2021-11-23 | Payer: Medicare Other | Source: Ambulatory Visit

## 2021-11-23 ENCOUNTER — Encounter: Payer: Self-pay | Admitting: *Deleted

## 2021-11-24 ENCOUNTER — Encounter: Payer: Self-pay | Admitting: Oncology

## 2021-11-24 MED FILL — Dexamethasone Sodium Phosphate Inj 100 MG/10ML: INTRAMUSCULAR | Qty: 1 | Status: AC

## 2021-11-27 ENCOUNTER — Encounter: Payer: Self-pay | Admitting: *Deleted

## 2021-11-27 ENCOUNTER — Ambulatory Visit
Admission: RE | Admit: 2021-11-27 | Discharge: 2021-11-27 | Disposition: A | Discharge status: Home / Self Care | Payer: Medicare Other | Source: Ambulatory Visit | Attending: Radiation Oncology | Admitting: Radiation Oncology

## 2021-11-27 ENCOUNTER — Inpatient Hospital Stay: Payer: Medicare Other

## 2021-11-27 ENCOUNTER — Encounter: Payer: Self-pay | Admitting: Oncology

## 2021-11-27 ENCOUNTER — Inpatient Hospital Stay (HOSPITAL_BASED_OUTPATIENT_CLINIC_OR_DEPARTMENT_OTHER): Payer: Medicare Other | Admitting: Oncology

## 2021-11-27 ENCOUNTER — Other Ambulatory Visit: Payer: Self-pay

## 2021-11-27 VITALS — BP 113/74 | HR 87 | Temp 97.8°F | Resp 16 | Ht 60.0 in | Wt 76.9 lb

## 2021-11-27 VITALS — BP 132/80 | HR 92 | Temp 98.5°F | Resp 16

## 2021-11-27 DIAGNOSIS — Z5111 Encounter for antineoplastic chemotherapy: Secondary | ICD-10-CM

## 2021-11-27 DIAGNOSIS — R0789 Other chest pain: Secondary | ICD-10-CM | POA: Diagnosis not present

## 2021-11-27 DIAGNOSIS — C342 Malignant neoplasm of middle lobe, bronchus or lung: Secondary | ICD-10-CM

## 2021-11-27 LAB — CBC WITH DIFFERENTIAL/PLATELET
Abs Immature Granulocytes: 0.1 10*3/uL — ABNORMAL HIGH (ref 0.00–0.07)
Basophils Absolute: 0 10*3/uL (ref 0.0–0.1)
Basophils Relative: 0 %
Eosinophils Absolute: 0.2 10*3/uL (ref 0.0–0.5)
Eosinophils Relative: 2 %
HCT: 36.2 % (ref 36.0–46.0)
Hemoglobin: 11.4 g/dL — ABNORMAL LOW (ref 12.0–15.0)
Immature Granulocytes: 1 %
Lymphocytes Relative: 10 %
Lymphs Abs: 1.2 10*3/uL (ref 0.7–4.0)
MCH: 25.2 pg — ABNORMAL LOW (ref 26.0–34.0)
MCHC: 31.5 g/dL (ref 30.0–36.0)
MCV: 79.9 fL — ABNORMAL LOW (ref 80.0–100.0)
Monocytes Absolute: 1.4 10*3/uL — ABNORMAL HIGH (ref 0.1–1.0)
Monocytes Relative: 12 %
Neutro Abs: 9.2 10*3/uL — ABNORMAL HIGH (ref 1.7–7.7)
Neutrophils Relative %: 75 %
Platelets: 449 10*3/uL — ABNORMAL HIGH (ref 150–400)
RBC: 4.53 MIL/uL (ref 3.87–5.11)
RDW: 15.8 % — ABNORMAL HIGH (ref 11.5–15.5)
WBC: 12.3 10*3/uL — ABNORMAL HIGH (ref 4.0–10.5)
nRBC: 0 % (ref 0.0–0.2)

## 2021-11-27 LAB — COMPREHENSIVE METABOLIC PANEL
ALT: 25 U/L (ref 0–44)
AST: 26 U/L (ref 15–41)
Albumin: 1.9 g/dL — ABNORMAL LOW (ref 3.5–5.0)
Alkaline Phosphatase: 95 U/L (ref 38–126)
Anion gap: 5 (ref 5–15)
BUN: 21 mg/dL (ref 8–23)
CO2: 28 mmol/L (ref 22–32)
Calcium: 7.5 mg/dL — ABNORMAL LOW (ref 8.9–10.3)
Chloride: 101 mmol/L (ref 98–111)
Creatinine, Ser: 0.6 mg/dL (ref 0.44–1.00)
GFR, Estimated: >60 mL/min (ref >60)
Glucose, Bld: 128 mg/dL — ABNORMAL HIGH (ref 70–99)
Potassium: 4 mmol/L (ref 3.5–5.1)
Sodium: 134 mmol/L — ABNORMAL LOW (ref 135–145)
Total Bilirubin: 0.3 mg/dL (ref 0.3–1.2)
Total Protein: 4.7 g/dL — ABNORMAL LOW (ref 6.5–8.1)

## 2021-11-27 LAB — RAD ONC ARIA SESSION SUMMARY
Course Elapsed Days: 0
Plan Fractions Treated to Date: 1
Plan Prescribed Dose Per Fraction: 2 Gy
Plan Total Fractions Prescribed: 35
Plan Total Prescribed Dose: 70 Gy
Reference Point Dosage Given to Date: 2 Gy
Reference Point Session Dosage Given: 2 Gy
Session Number: 1

## 2021-11-27 MED ORDER — OXYCODONE HCL 5 MG PO TABS: 10.0000 mg | ORAL_TABLET | Freq: Three times a day (TID) | ORAL | 0 refills | Status: DC | PRN

## 2021-11-27 MED ORDER — PALONOSETRON HCL INJECTION 0.25 MG/5ML
0.2500 mg | Freq: Once | INTRAVENOUS | Status: AC
  Administered 2021-11-27: 0.25 mg via INTRAVENOUS

## 2021-11-27 MED ORDER — SODIUM CHLORIDE 0.9 % IV SOLN
10.0000 mg | Freq: Once | INTRAVENOUS | Status: AC
  Administered 2021-11-27: 10 mg via INTRAVENOUS
  Filled 2021-11-27: qty 10

## 2021-11-27 MED ORDER — SODIUM CHLORIDE 0.9 % IV SOLN
110.0000 mg | Freq: Once | INTRAVENOUS | Status: AC
  Administered 2021-11-27: 110 mg via INTRAVENOUS
  Filled 2021-11-27: qty 11

## 2021-11-27 MED ORDER — SODIUM CHLORIDE 0.9% FLUSH
10.0000 mL | INTRAVENOUS | Status: DC | PRN
  Administered 2021-11-27: 10 mL
  Filled 2021-11-27: qty 10

## 2021-11-27 MED ORDER — DIPHENHYDRAMINE HCL 50 MG/ML IJ SOLN
50.0000 mg | Freq: Once | INTRAMUSCULAR | Status: AC
  Administered 2021-11-27: 50 mg via INTRAVENOUS

## 2021-11-27 MED ORDER — HEPARIN SOD (PORK) LOCK FLUSH 100 UNIT/ML IV SOLN
500.0000 [IU] | Freq: Once | INTRAVENOUS | Status: AC | PRN
  Administered 2021-11-27: 500 [IU]
  Filled 2021-11-27: qty 5

## 2021-11-27 MED ORDER — FAMOTIDINE IN NACL 20-0.9 MG/50ML-% IV SOLN
20.0000 mg | Freq: Once | INTRAVENOUS | Status: AC
  Administered 2021-11-27: 20 mg via INTRAVENOUS

## 2021-11-27 MED ORDER — SODIUM CHLORIDE 0.9 % IV SOLN
45.0000 mg/m2 | Freq: Once | INTRAVENOUS | Status: AC
  Administered 2021-11-27: 54 mg via INTRAVENOUS
  Filled 2021-11-27: qty 9

## 2021-11-27 MED ORDER — SODIUM CHLORIDE 0.9 % IV SOLN
Freq: Once | INTRAVENOUS | Status: AC
  Filled 2021-11-27: qty 250

## 2021-11-27 NOTE — Research (Signed)
  Trial:  A Randomized pragmatic Chair-Based Home Exercise Intervention for Mitigating Cancer-Related Fatigue in Older Adults Undergoing Chemotherapy for Advanced Disease    Patient Carol Shaffer was identified by this nurse as a potential candidate for the above listed study.  This Clinical Research Nurse met with DEEDRA PRO, [id number], on 11/27/21 in a manner and location that ensures patient privacy to discuss participation in the above listed research study.  Patient is Unaccompanied. The patient is receiving her first chemotherapy treatment today.  A copy of the informed consent document and separate HIPAA Authorization was provided to the patient and reviewed briefly.  Explanation was provided to the patient concerning her risks, potential benefits and alternative treatments. Patient reads, speaks, and understands [place].   Patient was provided with the business card of this Nurse and encouraged to contact the research team with any questions.  Approximately 20 minutes were spent with the patient reviewing the informed consent documents.  Patient was provided the option of taking informed consent documents home to review and was encouraged to review at their convenience with their support network, including other care providers. Patient took the consent documents home to review. Patient was encouraged to call the research office if she has any questions regarding the consent form or process. The patient requested a phone call Thursday morning of this week to review her decision to participate or not. The patient was informed that her participation is voluntary and if she decides not to participate in the protocol, there will be no change in the care she receives here with Dr. Janese Banks.   Jeral Fruit, RN 11/27/21 11:59 AM

## 2021-11-27 NOTE — Progress Notes (Signed)
Blood sample for [employer] One Liquid CDx collected via port this morning. Sample and requisition form placed for [employer] pickup.

## 2021-11-27 NOTE — Patient Instructions (Addendum)
[facility] AT [place]  Discharge Instructions: Thank you for choosing [facility] to provide your oncology and hematology care.  If you have a lab appointment with the [facility], please go directly to the [facility] and check in at the registration area.  Wear comfortable clothing and clothing appropriate for easy access to any Portacath or PICC line.   We strive to give you quality time with your provider. You may need to reschedule your appointment if you arrive late (15 or more minutes).  Arriving late affects you and other patients whose appointments are after yours.  Also, if you miss three or more appointments without notifying the office, you may be dismissed from the clinic at the provider's discretion.      For prescription refill requests, have your pharmacy contact our office and allow 72 hours for refills to be completed.    Today you received the following chemotherapy and/or immunotherapy agents- Taxol, Carboplatin       To help prevent nausea and vomiting after your treatment, we encourage you to take your nausea medication as directed.  BELOW ARE SYMPTOMS THAT SHOULD BE REPORTED IMMEDIATELY: *FEVER GREATER THAN 100.4 F (38 C) OR HIGHER *CHILLS OR SWEATING *NAUSEA AND VOMITING THAT IS NOT CONTROLLED WITH YOUR NAUSEA MEDICATION *UNUSUAL SHORTNESS OF BREATH *UNUSUAL BRUISING OR BLEEDING *URINARY PROBLEMS (pain or burning when urinating, or frequent urination) *BOWEL PROBLEMS (unusual diarrhea, constipation, pain near the anus) TENDERNESS IN MOUTH AND THROAT WITH OR WITHOUT PRESENCE OF ULCERS (sore throat, sores in mouth, or a toothache) UNUSUAL RASH, SWELLING OR PAIN  UNUSUAL VAGINAL DISCHARGE OR ITCHING   Items with * indicate a potential emergency and should be followed up as soon as possible or go to the Emergency Department if any problems should occur.  Please show the CHEMOTHERAPY ALERT CARD or IMMUNOTHERAPY ALERT CARD at  check-in to the Emergency Department and triage nurse.  Should you have questions after your visit or need to cancel or reschedule your appointment, please contact [facility] [place] [facility] [place]  [phone] and follow the prompts.  Office hours are 8:00 a.m. to 4:30 p.m. Monday - Friday. Please note that voicemails left after 4:00 p.m. may not be returned until the following business day.  We are closed weekends and major holidays. You have access to a nurse at all times for urgent questions. Please call the main number to the clinic [phone] and follow the prompts.  For any non-urgent questions, you may also contact your provider using MyChart. We now offer e-Visits for anyone 22 and older to request care online for non-urgent symptoms. For details visit [email or web address].   Also download the MyChart app! Go to the app store, search "MyChart", open the app, select [facility], and log in with your MyChart username and password.  Masks are optional in the cancer centers. If you would like for your care team to wear a mask while they are taking care of you, please let them know. For doctor visits, patients may have with them one support person who is at least 75 years old. At this time, visitors are not allowed in the infusion area.  Paclitaxel Injection What is this medication? PACLITAXEL (PAK li TAX el) treats some types of cancer. It works by slowing down the growth of cancer cells. This medicine may be used for other purposes; ask your health care provider or pharmacist if you have questions. COMMON BRAND NAME(S): Onxol, Taxol What should I tell  my care team before I take this medication? They need to know if you have any of these conditions: Heart disease Liver disease Low white blood cell levels An unusual or allergic reaction to paclitaxel, other medications, foods, dyes, or preservatives If you or your partner are pregnant or trying to get  pregnant Breast-feeding How should I use this medication? This medication is injected into a vein. It is given by your care team in a hospital or clinic setting. Talk to your care team about the use of this medication in children. While it may be given to children for selected conditions, precautions do apply. Overdosage: If you think you have taken too much of this medicine contact a poison control center or emergency room at once. NOTE: This medicine is only for you. Do not share this medicine with others. What if I miss a dose? Keep appointments for follow-up doses. It is important not to miss your dose. Call your care team if you are unable to keep an appointment. What may interact with this medication? Do not take this medication with any of the following: Live virus vaccines Other medications may affect the way this medication works. Talk with your care team about all of the medications you take. They may suggest changes to your treatment plan to lower the risk of side effects and to make sure your medications work as intended. This list may not describe all possible interactions. Give your health care provider a list of all the medicines, herbs, non-prescription drugs, or dietary supplements you use. Also tell them if you smoke, drink alcohol, or use illegal drugs. Some items may interact with your medicine. What should I watch for while using this medication? Your condition will be monitored carefully while you are receiving this medication. You may need blood work while taking this medication. This medication may make you feel generally unwell. This is not uncommon as chemotherapy can affect healthy cells as well as cancer cells. Report any side effects. Continue your course of treatment even though you feel ill unless your care team tells you to stop. This medication can cause serious allergic reactions. To reduce the risk, your care team may give you other medications to take before  receiving this one. Be sure to follow the directions from your care team. This medication may increase your risk of getting an infection. Call your care team for advice if you get a fever, chills, sore throat, or other symptoms of a cold or flu. Do not treat yourself. Try to avoid being around people who are sick. This medication may increase your risk to bruise or bleed. Call your care team if you notice any unusual bleeding. Be careful brushing or flossing your teeth or using a toothpick because you may get an infection or bleed more easily. If you have any dental work done, tell your dentist you are receiving this medication. Talk to your care team if you may be pregnant. Serious birth defects can occur if you take this medication during pregnancy. Talk to your care team before breastfeeding. Changes to your treatment plan may be needed. What side effects may I notice from receiving this medication? Side effects that you should report to your care team as soon as possible: Allergic reactions--skin rash, itching, hives, swelling of the face, lips, tongue, or throat Heart rhythm changes--fast or irregular heartbeat, dizziness, feeling faint or lightheaded, chest pain, trouble breathing Increase in blood pressure Infection--fever, chills, cough, sore throat, wounds that don't heal, pain or  trouble when passing urine, general feeling of discomfort or being unwell Low blood pressure--dizziness, feeling faint or lightheaded, blurry vision Low red blood cell level--unusual weakness or fatigue, dizziness, headache, trouble breathing Painful swelling, warmth, or redness of the skin, blisters or sores at the infusion site Pain, tingling, or numbness in the hands or feet Slow heartbeat--dizziness, feeling faint or lightheaded, confusion, trouble breathing, unusual weakness or fatigue Unusual bruising or bleeding Side effects that usually do not require medical attention (report to your care team if they  continue or are bothersome): Diarrhea Hair loss Joint pain Loss of appetite Muscle pain Nausea Vomiting This list may not describe all possible side effects. Call your doctor for medical advice about side effects. You may report side effects to FDA at 1-800-FDA-1088. Where should I keep my medication? This medication is given in a hospital or clinic. It will not be stored at home. NOTE: This sheet is a summary. It may not cover all possible information. If you have questions about this medicine, talk to your doctor, pharmacist, or health care provider.  2023 Elsevier/Gold Standard (2021-05-17 00:00:00)    Carboplatin Injection What is this medication? CARBOPLATIN (KAR boe pla tin) treats some types of cancer. It works by slowing down the growth of cancer cells. This medicine may be used for other purposes; ask your health care provider or pharmacist if you have questions. COMMON BRAND NAME(S): Paraplatin What should I tell my care team before I take this medication? They need to know if you have any of these conditions: Blood disorders Hearing problems Kidney disease Recent or ongoing radiation therapy An unusual or allergic reaction to carboplatin, cisplatin, other medications, foods, dyes, or preservatives Pregnant or trying to get pregnant Breast-feeding How should I use this medication? This medication is injected into a vein. It is given by your care team in a hospital or clinic setting. Talk to your care team about the use of this medication in children. Special care may be needed. Overdosage: If you think you have taken too much of this medicine contact a poison control center or emergency room at once. NOTE: This medicine is only for you. Do not share this medicine with others. What if I miss a dose? Keep appointments for follow-up doses. It is important not to miss your dose. Call your care team if you are unable to keep an appointment. What may interact with this  medication? Medications for seizures Some antibiotics, such as amikacin, gentamicin, neomycin, streptomycin, tobramycin Vaccines This list may not describe all possible interactions. Give your health care provider a list of all the medicines, herbs, non-prescription drugs, or dietary supplements you use. Also tell them if you smoke, drink alcohol, or use illegal drugs. Some items may interact with your medicine. What should I watch for while using this medication? Your condition will be monitored carefully while you are receiving this medication. You may need blood work while taking this medication. This medication may make you feel generally unwell. This is not uncommon, as chemotherapy can affect healthy cells as well as cancer cells. Report any side effects. Continue your course of treatment even though you feel ill unless your care team tells you to stop. In some cases, you may be given additional medications to help with side effects. Follow all directions for their use. This medication may increase your risk of getting an infection. Call your care team for advice if you get a fever, chills, sore throat, or other symptoms of a cold or  flu. Do not treat yourself. Try to avoid being around people who are sick. Avoid taking medications that contain aspirin, acetaminophen, ibuprofen, naproxen, or ketoprofen unless instructed by your care team. These medications may hide a fever. Be careful brushing or flossing your teeth or using a toothpick because you may get an infection or bleed more easily. If you have any dental work done, tell your dentist you are receiving this medication. Talk to your care team if you wish to become pregnant or think you might be pregnant. This medication can cause serious birth defects. Talk to your care team about effective forms of contraception. Do not breast-feed while taking this medication. What side effects may I notice from receiving this medication? Side effects  that you should report to your care team as soon as possible: Allergic reactions--skin rash, itching, hives, swelling of the face, lips, tongue, or throat Infection--fever, chills, cough, sore throat, wounds that don't heal, pain or trouble when passing urine, general feeling of discomfort or being unwell Low red blood cell level--unusual weakness or fatigue, dizziness, headache, trouble breathing Pain, tingling, or numbness in the hands or feet, muscle weakness, change in vision, confusion or trouble speaking, loss of balance or coordination, trouble walking, seizures Unusual bruising or bleeding Side effects that usually do not require medical attention (report to your care team if they continue or are bothersome): Hair loss Nausea Unusual weakness or fatigue Vomiting This list may not describe all possible side effects. Call your doctor for medical advice about side effects. You may report side effects to FDA at 1-800-FDA-1088. Where should I keep my medication? This medication is given in a hospital or clinic. It will not be stored at home. NOTE: This sheet is a summary. It may not cover all possible information. If you have questions about this medicine, talk to your doctor, pharmacist, or health care provider.  2023 Elsevier/Gold Standard (2021-05-01 00:00:00)

## 2021-11-27 NOTE — Progress Notes (Signed)
Hematology/Oncology Consult note [facility]  [phone] [phone]  Patient Care Team: Albina Billet, MD as PCP - General (Internal Medicine) Marden Noble, MD (Internal Medicine) Bary Castilla, Forest Gleason, MD (General Surgery) Telford Nab, RN as Oncology Nurse Navigator Noreene Filbert, MD as Referring Physician (Radiation Oncology) Sindy Guadeloupe, MD as Consulting Physician (Oncology)   Name of the patient: Carol Shaffer  [id number]  1945/11/22   Date of visit: 11/27/21  Diagnosis- non-small cell lung cancer involving the right middle lobe stage IV T1b N2 M1   Chief complaint/ Reason for visit-on treatment assessment prior to cycle 1 of weekly CarboTaxol chemotherapy  Heme/Onc history: Patient is a 76 year old female with a history oflung cancer in 2012 s/p right upper lobe resection.  Low dose chest CT on 09/04/2018 revealed multiple small pulmonary nodules are noted in the lungs bilaterally. In addition, there was a 1.53 cm concerning nodule in the right lower lobe. There was diffuse bronchial wall thickening with mild centrilobular and paraseptal emphysema; imaging findings suggestive of underlying COPD.PET scan on 09/16/2018 revealed low level FDG uptake associated with the right lower lobe lung nodule (SUV 1.57). This was a nonspecific finding. Given the size of this nodule and the low level uptake, findings may reflect inflammatory or infectious nodule. Malignancy was not entirely excluded.  Low dose chest CT on 12/04/2018 revealed a 15.3 mm RLL nodule unchanged in size.   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  There were some ground-glass attenuation components, but is predominantly solid in appearance with some internal air bronchograms, with macrolobulated and spiculated borders, clearly  increased in size.   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  Tumor was positive for CK7 and TTF-1 and negative for p40.  She is not a surgical candidate.   She received 6000 cGy SBRT to the RLL from 08/05/2019 - 08/17/2019.   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  History of iron deficiency anemia and she has undergone work-up in the past.  She has a history of gastric bypass surgery.    Patient hadCT scan of breast 2023 which showed narrowing and occlusion of the right middle lobe bronchus which was looking more prominent as compared to prior scans.  There was also concern for paratracheal adenopathy which had increased in size as compared to prior scans.  Patient was seen by pulmonary and underwent bronchoscopy guided biopsy.  Bronchial biopsies of the right middle lobe did not show any malignancy however cytology from station 7 and station 4R lymph node was consistent with adenocarcinoma.    Interval history-patient reports ongoing right chest wall pain.  She is taking 2 oxycodone tablets 5 mg 3 times a day which is helping with her pain.  ECOG PS- 1 Pain scale- 3 Opioid associated constipation- no  Review of systems- Review of Systems  Constitutional:  Positive for malaise/fatigue. Negative for chills, fever and weight loss.  HENT:  Negative for congestion, ear discharge and nosebleeds.   Eyes:  Negative for blurred vision.  Respiratory:  Negative for cough, hemoptysis, sputum production, shortness of breath and wheezing.        Right chest wall pain  Cardiovascular:  Negative for chest pain, palpitations, orthopnea and claudication.  Gastrointestinal:  Negative for abdominal pain, blood in  stool, constipation, diarrhea, heartburn, melena, nausea and vomiting.  Genitourinary:  Negative for dysuria, flank pain, frequency, hematuria and urgency.  Musculoskeletal:  Negative for back pain,  joint pain and myalgias.  Skin:  Negative for rash.  Neurological:  Negative for dizziness, tingling, focal weakness, seizures, weakness and headaches.  Endo/Heme/Allergies:  Does not bruise/bleed easily.  Psychiatric/Behavioral:  Negative for depression and suicidal ideas. The patient does not have insomnia.       Allergies  Allergen Reactions   Hydromorphone Hcl Itching   Bentyl [Dicyclomine] Nausea And Vomiting   Biaxin [Clarithromycin] Other (See Comments)    hallucinations   Budesonide Other (See Comments)    Ulcer   Erythromycin Nausea And Vomiting   Reglan [Metoclopramide] Other (See Comments)    tremors     Past Medical History:  Diagnosis Date   Arthritis    hands   Avascular necrosis of hip, right ([place])    Blepharospasm    Cancer ([place]) 2011   Right Upper Lobe Lobectomy   Chronic diarrhea    Chronic diarrhea    Collagenous colitis    Complication of anesthesia    usually wakes up during procedures  (endoscopy and colonoscopy)   COPD (chronic obstructive pulmonary disease) ([place])    COVID 2022   Depression    Diverticulosis    Gastric outlet obstruction    Headache    every couple of days   Hemorrhoid    History of Crohn's disease    Hypoglycemic disorder    IDA (iron deficiency anemia)    Intestinal adhesions    Multiple gastric ulcers    Multiple gastric ulcers    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[place])    Stroke ([place]) 8 yrs ago   double vision left eye     Past Surgical History:  Procedure Laterality Date   APPENDECTOMY  1989   botox injections for blepharospasm     BREAST SURGERY     CATARACT EXTRACTION     COLON RESECTION  2005   COLONOSCOPY  10-29-2008   [name]   COLONOSCOPY WITH PROPOFOL N/A 03/05/2017   Procedure: COLONOSCOPY WITH PROPOFOL;  Surgeon: Toledo, Benay Pike, MD;  Location: [facility] ENDOSCOPY;  Service: Gastroenterology;  Laterality: N/A;   COLOSTOMY REVERSAL  2006   ESOPHAGOGASTRODUODENOSCOPY (EGD) WITH PROPOFOL N/A  07/18/2016   Procedure: ESOPHAGOGASTRODUODENOSCOPY (EGD) WITH PROPOFOL;  Surgeon: Robert Bellow, MD;  Location: [facility] ENDOSCOPY;  Service: Endoscopy;  Laterality: N/A;   ESOPHAGOGASTRODUODENOSCOPY (EGD) WITH PROPOFOL N/A 08/03/2016   Procedure: ESOPHAGOGASTRODUODENOSCOPY (EGD) WITH PROPOFOL;  Surgeon: Robert Bellow, MD;  Location: [facility] ENDOSCOPY;  Service: Endoscopy;  Laterality: N/A;   ESOPHAGOGASTRODUODENOSCOPY (EGD) WITH PROPOFOL N/A 09/01/2018   Procedure: ESOPHAGOGASTRODUODENOSCOPY (EGD) WITH PROPOFOL;  Surgeon: Toledo, Benay Pike, MD;  Location: [facility] END[facility];  Service: Gastroenterology;  Laterality: N/A;   ESOPHAGOGASTRODUODENOSCOPY (EGD) WITH PROPOFOL N/A 12/01/2018   Procedure: ESOPHAGOGASTRODUODENOSCOPY (EGD) WITH PROPOFOL;  Surgeon: Toledo, Benay Pike, MD;  Location: [facility];  Service: Gastroenterology;  Laterality: N/A;   ESOPHAGOGASTRODUODENOSCOPY (EGD) WITH PROPOFOL N/A 02/23/2019   Procedure: ESOPHAGOGASTRODUODENOSCOPY (EGD) WITH PROPOFOL;  Surgeon: Lucilla Lame, MD;  Location: [place];  Service: Endoscopy;  Laterality: N/A;   ESOPHAGOSCOPY WITH DILITATION  2012,2015   [facility] [place]   EYE SURGERY     FLEXIBLE BRONCHOSCOPY N/A 10/30/2021   Procedure: FLEXIBLE BRONCHOSCOPY;  Surgeon: Armando Reichert, MD;  Location: [facility];  Service: Pulmonary;  Laterality: N/A;   GASTRECTOMY     gastric ulcer  1989,  1991   IR IMAGING GUIDED PORT INSERTION  11/14/2021   JOINT REPLACEMENT     right total hip arthroplasty 03/03/02   LAPAROSCOPIC LYSIS OF ADHESIONS     LUNG CANCER SURGERY  2011   LYSIS OF ADHESION     PLACEMENT OF BREAST IMPLANTS  1973   thoracotomy with right upper lobectomy and central compartment node dissection     UPPER GASTROINTESTINAL ENDOSCOPY  10-29-2008   [name]   VIDEO BRONCHOSCOPY WITH ENDOBRONCHIAL ULTRASOUND N/A 10/30/2021   Procedure: VIDEO BRONCHOSCOPY WITH ENDOBRONCHIAL ULTRASOUND;  Surgeon: Armando Reichert, MD;  Location: [facility];  Service:  Pulmonary;  Laterality: N/A;    Social History   Socioeconomic History   Marital status: Widowed    Spouse name: Not on file   Number of children: 2   Years of education: Not on file   Highest education level: Not on file  Occupational History   Occupation: retired    Comment: Pharmacist, hospital  Tobacco Use   Smoking status: Former    Packs/day: 1.00    Years: 25.00    Total pack years: 25.00    Types: Cigarettes    Quit date: 01/30/2008    Years since quitting: 13.8   Smokeless tobacco: Never  Vaping Use   Vaping Use: Never used  Substance and Sexual Activity   Alcohol use: Yes    Comment: occ. for holiday   Drug use: No   Sexual activity: Not Currently  Other Topics Concern   Not on file  Social History Narrative   Lives alone    Social Determinants of Health   Financial Resource Strain: Not on file  Food Insecurity: Not on file  Transportation Needs: Unmet Transportation Needs (11/27/2021)   PRAPARE - Hydrologist (Medical): Yes    Lack of Transportation (Non-Medical): Yes  Physical Activity: Not on file  Stress: Not on file  Social Connections: Not on file  Intimate Partner Violence: Not on file    History reviewed. No pertinent family history.   Current Outpatient Medications:    acetaminophen (TYLENOL) 500 MG tablet, Take 500 mg by mouth every 6 (six) hours as needed., Disp: , Rfl:    brimonidine-timolol (COMBIGAN) 0.2-0.5 % ophthalmic solution, INT 1 GTT IN OU BID, Disp: , Rfl:    Cholecalciferol (VITAMIN D3) 1000 units CAPS, Take 1 capsule by mouth daily., Disp: , Rfl:    dexamethasone (DECADRON) 4 MG tablet, Take 2 tablets daily for 2 days, start the day after chemotherapy. Take with food., Disp: 30 tablet, Rfl: 1   fluticasone (FLONASE) 50 MCG/ACT nasal spray, Place 2 sprays into both nostrils daily., Disp: , Rfl:    lidocaine-prilocaine (EMLA) cream, Apply to affected area once, Disp: 30 g, Rfl: 3   loratadine (CLARITIN) 10 MG  tablet, Take 10 mg by mouth daily., Disp: , Rfl:    Multiple Vitamin (MULTI-VITAMINS) TABS, Take 1 tablet by mouth daily. once daily., Disp: , Rfl:    NON FORMULARY, Take 1 Dose by mouth daily. BONE BROTH, Disp: , Rfl:    oxyCODONE (OXY IR/ROXICODONE) 5 MG immediate release tablet, Take 1 tablet (5 mg total) by mouth 3 (three) times daily as needed for severe pain., Disp: 90 tablet, Rfl: 0   pantoprazole (PROTONIX) 40 MG tablet, Take 40 mg by mouth 2 (two) times daily. , Disp: , Rfl:    sucralfate (CARAFATE) 1 G tablet, Take 1 g by mouth 4 (four) times daily. , Disp: ,  Rfl:    traZODone (DESYREL) 150 MG tablet, Take 300 mg by mouth at bedtime. , Disp: , Rfl:    venlafaxine XR (EFFEXOR-XR) 150 MG 24 hr capsule, Take 150 mg by mouth daily with breakfast. , Disp: , Rfl:    ondansetron (ZOFRAN) 8 MG tablet, Take 1 tablet (8 mg total) by mouth every 8 (eight) hours as needed for nausea or vomiting. Start on the third day after chemotherapy. (Patient not taking: Reported on 11/27/2021), Disp: 30 tablet, Rfl: 1   prochlorperazine (COMPAZINE) 10 MG tablet, Take 1 tablet (10 mg total) by mouth every 6 (six) hours as needed for nausea or vomiting. (Patient not taking: Reported on 11/27/2021), Disp: 30 tablet, Rfl: 1 No current facility-administered medications for this visit.  Facility-Administered Medications Ordered in Other Visits:    CARBOplatin (PARAPLATIN) 110 mg in sodium chloride 0.9 % 100 mL chemo infusion, 110 mg, Intravenous, Once, Sindy Guadeloupe, MD   heparin lock flush 100 unit/mL, 500 Units, Intracatheter, Once PRN, Sindy Guadeloupe, MD   PACLitaxel (TAXOL) 54 mg in sodium chloride 0.9 % 150 mL chemo infusion (</= 6m/m2), 45 mg/m2 (Treatment Plan Recorded), Intravenous, Once, RSindy Guadeloupe MD, Last Rate: 38 mL/hr at 11/27/21 1041, 54 mg at 11/27/21 1041   sodium chloride flush (NS) 0.9 % injection 10 mL, 10 mL, Intracatheter, PRN, RSindy Guadeloupe MD  Physical exam:  Vitals:   11/27/21 0844   BP: 113/74  Pulse: 87  Resp: 16  Temp: 97.8 F (36.6 C)  TempSrc: Oral  Weight: 76 lb 14.4 oz (34.9 kg)  Height: 5' (1.524 m)   Physical Exam Constitutional:      General: She is not in acute distress. Cardiovascular:     Rate and Rhythm: Normal rate and regular rhythm.     Heart sounds: Normal heart sounds.  Pulmonary:     Effort: Pulmonary effort is normal.     Breath sounds: Normal breath sounds.  Abdominal:     General: Bowel sounds are normal.     Palpations: Abdomen is soft.  Skin:    General: Skin is warm and dry.  Neurological:     Mental Status: She is alert and oriented to person, place, and time.         Latest Ref Rng & Units 11/27/2021    8:17 AM  CMP  Glucose 70 - 99 mg/dL 128   BUN 8 - 23 mg/dL 21   Creatinine 0.44 - 1.00 mg/dL 0.60   Sodium 135 - 145 mmol/L 134   Potassium 3.5 - 5.1 mmol/L 4.0   Chloride 98 - 111 mmol/L 101   CO2 22 - 32 mmol/L 28   Calcium 8.9 - 10.3 mg/dL 7.5   Total Protein 6.5 - 8.1 g/dL 4.7   Total Bilirubin 0.3 - 1.2 mg/dL 0.3   Alkaline Phos 38 - 126 U/L 95   AST 15 - 41 U/L 26   ALT 0 - 44 U/L 25       Latest Ref Rng & Units 11/27/2021    8:17 AM  CBC  WBC 4.0 - 10.5 K/uL 12.3   Hemoglobin 12.0 - 15.0 g/dL 11.4   Hematocrit 36.0 - 46.0 % 36.2   Platelets 150 - 400 K/uL 449     No images are attached to the encounter.  IR IMAGING GUIDED PORT INSERTION  Result Date: 11/14/2021 INDICATION: 76year old female with history of advanced stage lung cancer requiring central venous access for chemotherapy administration EXAM:  IMPLANTED PORT A CATH PLACEMENT WITH ULTRASOUND AND FLUOROSCOPIC GUIDANCE COMPARISON:  None Available. MEDICATIONS: None. ANESTHESIA/SEDATION: Moderate (conscious) sedation was employed during this procedure. A total of Versed 1.5 mg and Fentanyl 75 mcg was administered intravenously. Moderate Sedation Time: 14 minutes. The patient's level of consciousness and vital signs were monitored continuously by  radiology nursing throughout the procedure under my direct supervision. CONTRAST:  None FLUOROSCOPY TIME:  3.3 mGy COMPLICATIONS: None immediate. PROCEDURE: The procedure, risks, benefits, and alternatives were explained to the patient. Questions regarding the procedure were encouraged and answered. The patient understands and consents to the procedure. The right neck and chest were prepped with chlorhexidine in a sterile fashion, and a sterile drape was applied covering the operative field. Maximum barrier sterile technique with sterile gowns and gloves were used for the procedure. A timeout was performed prior to the initiation of the procedure. Ultrasound was used to examine the jugular vein which was compressible and free of internal echoes. A skin marker was used to demarcate the planned venotomy and port pocket incision sites. Local anesthesia was provided to these sites and the subcutaneous tunnel track with 1% lidocaine with 1:100,000 epinephrine. A small incision was created at the jugular access site and blunt dissection was performed of the subcutaneous tissues. Under ultrasound guidance, the jugular vein was accessed with a 21 ga micropuncture needle and an 0.018" wire was inserted to the superior vena cava. Real-time ultrasound guidance was utilized for vascular access including the acquisition of a permanent ultrasound image documenting patency of the accessed vessel. A 5 Fr micopuncture set was then used, through which a 0.035" Rosen wire was passed under fluoroscopic guidance into the inferior vena cava. An 8 Fr dilator was then placed over the wire. A subcutaneous port pocket was then created along the upper chest wall utilizing a combination of sharp and blunt dissection. The pocket was irrigated with sterile saline, packed with gauze, and observed for hemorrhage. A single lumen [employer] power injectable port was chosen for placement. The 8 Fr catheter was tunneled from the port pocket site  to the venotomy incision. The port was placed in the pocket. The external catheter was trimmed to appropriate length. The dilator was exchanged for an 8 Fr peel-away sheath under fluoroscopic guidance. The catheter was then placed through the sheath and the sheath was removed. Final catheter positioning was confirmed and documented with a fluoroscopic spot radiograph. The port was accessed with a Huber needle, aspirated, and flushed with heparinized saline. The deep dermal layer of the port pocket incision was closed with interrupted 3-0 Vicryl suture. Dermabond was then placed over the port pocket and neck incisions. The patient tolerated the procedure well without immediate post procedural complication. FINDINGS: After catheter placement, the tip lies within the superior cavoatrial junction. The catheter aspirates and flushes normally and is ready for immediate use. IMPRESSION: Successful placement of a power injectable Port-A-Cath via the right internal jugular vein. The catheter is ready for immediate use. Ruthann Cancer, MD [facility] Electronically Signed   By: Ruthann Cancer M.D.   On: 11/14/2021 14:19   MR Brain W Wo Contrast  Result Date: 11/11/2021 CLINICAL DATA:  Lung carcinoma.  SRS protocol. EXAM: MRI HEAD WITHOUT AND WITH CONTRAST TECHNIQUE: Multiplanar, multiecho pulse sequences of the brain and surrounding structures were obtained without and with intravenous contrast. CONTRAST:  7.78m GADAVIST GADOBUTROL 1 MMOL/ML IV SOLN COMPARISON:  12/24/2011 FINDINGS: Brain: No acute infarct, mass effect  or extra-axial collection. Multifocal chronic microhemorrhage in the posterior right hemisphere. There is multifocal hyperintense T2-weighted signal within the white matter. Parenchymal volume and CSF spaces are normal. The midline structures are normal. Vascular: Major flow voids are preserved. Skull and upper cervical spine: Normal calvarium and  skull base. Visualized upper cervical spine and soft tissues are normal. Sinuses/Orbits:No paranasal sinus fluid levels or advanced mucosal thickening. No mastoid or middle ear effusion. Normal orbits. IMPRESSION: No intracranial metastatic disease. Electronically Signed   By: Ulyses Jarred M.D.   On: 11/11/2021 21:10   NM PET Image Initial (PI) Skull Base To Thigh  Result Date: 11/09/2021 CLINICAL DATA:  Subsequent treatment strategy for lung cancer. EXAM: NUCLEAR MEDICINE PET SKULL BASE TO THIGH TECHNIQUE: 5.59 mCi F-18 FDG was injected intravenously. Full-ring PET imaging was performed from the skull base to thigh after the radiotracer. CT data was obtained and used for attenuation correction and anatomic localization. Fasting blood glucose: 105 mg/dl COMPARISON:  Multiple prior chest CTs and a prior PET-CT from 2020 FINDINGS: Mediastinal blood pool activity: SUV max 1.38 Liver activity: SUV max NA NECK: No hypermetabolic lymph nodes in the neck. Incidental CT findings: None. CHEST: Soft tissue thickening with compression and narrowing of the bronchus intermedius demonstrates moderate hypermetabolism with SUV max of 4.72. Findings consistent recurrent tumor. There is also a 9 mm right paratracheal node which is hypermetabolic. SUV max is 3.79. The crescentic density the in the right lower lobe is stable and has the appearance of post radiation changes. No hypermetabolism to suggest recurrent tumor in this area. The left lower lobe ground-glass nodule is difficult to see on this study. No hypermetabolism is demonstrated. Incidental CT findings: Stable surgical changes from a right upper lobe lobectomy with marked pleural thickening anteriorly. Stable underlying severe emphysematous changes and pulmonary scarring. Stable aortic and coronary artery calcifications. Bilateral rim calcified breast prostheses. ABDOMEN/PELVIS: No abnormal hypermetabolic activity within the liver, pancreas, adrenal glands, or spleen.  No hypermetabolic lymph nodes in the abdomen or pelvis. Incidental CT findings: Stable advanced vascular calcifications. Stable surgical changes from anterior abdominal wall hernia repair. Large duodenal diverticulum. Evidence of previous bowel surgery in the left side. SKELETON: There is a lytic bone lesions involving the transverse process of T2 on the right side. This demonstrates hypermetabolism with SUV max of 4.29 and is consistent with a new bone metastasis. No other bone lesions are identified. Incidental CT findings: None. IMPRESSION: 1. PET-CT findings consistent with recurrent tumor surrounding and compressing the bronchus intermedius. There is also a hypermetabolic right paratracheal lymph node. 2. New lytic bone lesion involving the transverse process of T2 on the right side is hypermetabolic and consistent with a new bone metastasis. 3. Stable post radiation changes involving the right lower lobe lower lobe. 4. The left lower lobe ground-glass nodule is not well seen on this study and no hypermetabolism is demonstrated. Recommend attention on follow-up chest CTs. 5. Stable surgical changes from a right upper lobe lobectomy. 6. Stable severe emphysematous changes and pulmonary scarring. 7. Stable advanced atherosclerotic calcifications involving the thoracic and abdominal aorta and branch vessels. 8. Aortic atherosclerosis. Aortic Atherosclerosis (ICD10-I70.0) and Emphysema (ICD10-J43.9). Electronically Signed   By: Marijo Sanes M.D.   On: 11/09/2021 11:06   DG Chest Port 1 View  Result Date: 10/30/2021 CLINICAL DATA:  Status post bronchoscopy with biopsy. EXAM: PORTABLE CHEST 1 VIEW COMPARISON:  Radiographs 06/13/2020 and 06/17/2019. Chest CT 09/13/2021. FINDINGS: No recent radiographs are available for correlation. There are  postsurgical changes in the right hemithorax status post upper lobectomy. The degree of volume loss appears increased compared with the most recent CT, probably related to  associated atelectasis. Right perihilar consolidation is grossly unchanged. The left lung is clear. There is no evidence of pneumothorax or significant pleural effusion. The heart size and mediastinal contours are stable. Bilateral breast implants are noted. IMPRESSION: 1. No evidence of pneumothorax following bronchoscopy. 2. Increased volume loss in the right hemithorax status post upper lobectomy, probably related to associated atelectasis. Electronically Signed   By: Richardean Sale M.D.   On: 10/30/2021 14:46     Assessment and plan- Patient is a 76 y.o. female with history of non-small cell lung cancer involving the right middle lobe stage IV T1b N2 M1.  She is here for on treatment assessment prior to cycle 1 of weekly CarboTaxol chemotherapy  Patient has an isolated T2 lesion which was hypermetabolic on PET scan and resumed site of isolated metastatic disease.  As such we are treating her with a curative intent with concurrent chemoradiation with weekly CarboTaxol for her T1BN2 disease.  She will likely receive palliative radiation to her T2 lesion as well.  Upon completion of chemoradiation I will repeat her CT scan and consider maintenance durvalumab like we would treat stage III disease.  I will consider initiation of bisphosphonates upon completion of chemoradiation and scans.  Right chest wall pain: Possibly secondary to malignancy.  Continue 5 mg oxycodone 1 to 2 tablets 3 times a day as needed.  I have renewed her prescription today.  Counts otherwise okay to proceed with cycle 1 of weekly CarboTaxol chemotherapy today.  She will directly proceed for cycle 2 next week and I will see him back in 2 weeks for cycle 3.  Discussed risks and benefits of chemotherapy including all but not limited to nausea, vomiting, low blood counts, risk of infections and hospitalization.  Treatment will be given with a palliative intent   Visit Diagnosis 1. Non-small cell cancer of middle lobe of right lung  ([place])   2. Encounter for antineoplastic chemotherapy   3. Chest wall pain      Dr. Randa Evens, MD, MPH [facility] at [facility] [phone] 11/27/2021 10:48 AM

## 2021-11-28 ENCOUNTER — Other Ambulatory Visit: Payer: Self-pay

## 2021-11-28 ENCOUNTER — Inpatient Hospital Stay: Payer: Medicare Other

## 2021-11-28 ENCOUNTER — Ambulatory Visit
Admission: RE | Admit: 2021-11-28 | Discharge: 2021-11-28 | Disposition: A | Discharge status: Home / Self Care | Payer: Medicare Other | Source: Ambulatory Visit | Attending: Radiation Oncology | Admitting: Radiation Oncology

## 2021-11-28 ENCOUNTER — Encounter: Payer: Self-pay | Admitting: *Deleted

## 2021-11-28 ENCOUNTER — Telehealth: Payer: Self-pay

## 2021-11-28 DIAGNOSIS — Z5111 Encounter for antineoplastic chemotherapy: Secondary | ICD-10-CM | POA: Diagnosis not present

## 2021-11-28 LAB — RAD ONC ARIA SESSION SUMMARY
Course Elapsed Days: 1
Plan Fractions Treated to Date: 2
Plan Prescribed Dose Per Fraction: 2 Gy
Plan Total Fractions Prescribed: 35
Plan Total Prescribed Dose: 70 Gy
Reference Point Dosage Given to Date: 4 Gy
Reference Point Session Dosage Given: 2 Gy
Session Number: 2

## 2021-11-28 NOTE — Telephone Encounter (Signed)
Telephone call to patient for follow up after receiving first infusion.   Patient states infusion went great.  States eating good and drinking plenty of fluids.   Denies any nausea or vomiting.  Encouraged patient to call for any concerns or questions. 

## 2021-11-29 ENCOUNTER — Ambulatory Visit
Admission: RE | Admit: 2021-11-29 | Discharge: 2021-11-29 | Disposition: A | Discharge status: Home / Self Care | Payer: Medicare Other | Source: Ambulatory Visit | Attending: Radiation Oncology | Admitting: Radiation Oncology

## 2021-11-29 ENCOUNTER — Other Ambulatory Visit: Payer: Self-pay

## 2021-11-29 ENCOUNTER — Encounter: Payer: Self-pay | Admitting: *Deleted

## 2021-11-29 ENCOUNTER — Inpatient Hospital Stay: Payer: Medicare Other

## 2021-11-29 DIAGNOSIS — Z87891 Personal history of nicotine dependence: Secondary | ICD-10-CM | POA: Insufficient documentation

## 2021-11-29 DIAGNOSIS — Z5111 Encounter for antineoplastic chemotherapy: Secondary | ICD-10-CM | POA: Insufficient documentation

## 2021-11-29 DIAGNOSIS — D509 Iron deficiency anemia, unspecified: Secondary | ICD-10-CM | POA: Insufficient documentation

## 2021-11-29 DIAGNOSIS — Z79899 Other long term (current) drug therapy: Secondary | ICD-10-CM | POA: Insufficient documentation

## 2021-11-29 DIAGNOSIS — G893 Neoplasm related pain (acute) (chronic): Secondary | ICD-10-CM | POA: Insufficient documentation

## 2021-11-29 DIAGNOSIS — Z902 Acquired absence of lung [part of]: Secondary | ICD-10-CM | POA: Insufficient documentation

## 2021-11-29 DIAGNOSIS — Z9884 Bariatric surgery status: Secondary | ICD-10-CM | POA: Insufficient documentation

## 2021-11-29 DIAGNOSIS — C3431 Malignant neoplasm of lower lobe, right bronchus or lung: Secondary | ICD-10-CM | POA: Diagnosis present

## 2021-11-29 DIAGNOSIS — C342 Malignant neoplasm of middle lobe, bronchus or lung: Secondary | ICD-10-CM | POA: Insufficient documentation

## 2021-11-29 DIAGNOSIS — Z7952 Long term (current) use of systemic steroids: Secondary | ICD-10-CM | POA: Insufficient documentation

## 2021-11-29 DIAGNOSIS — Z510 Encounter for antineoplastic radiation therapy: Secondary | ICD-10-CM | POA: Insufficient documentation

## 2021-11-29 DIAGNOSIS — D801 Nonfamilial hypogammaglobulinemia: Secondary | ICD-10-CM | POA: Insufficient documentation

## 2021-11-29 LAB — RAD ONC ARIA SESSION SUMMARY
Course Elapsed Days: 2
Plan Fractions Treated to Date: 3
Plan Prescribed Dose Per Fraction: 2 Gy
Plan Total Fractions Prescribed: 35
Plan Total Prescribed Dose: 70 Gy
Reference Point Dosage Given to Date: 6 Gy
Reference Point Session Dosage Given: 2 Gy
Session Number: 3

## 2021-11-30 ENCOUNTER — Inpatient Hospital Stay: Payer: Medicare Other

## 2021-11-30 ENCOUNTER — Other Ambulatory Visit: Payer: Self-pay

## 2021-11-30 ENCOUNTER — Ambulatory Visit
Admission: RE | Admit: 2021-11-30 | Discharge: 2021-11-30 | Disposition: A | Discharge status: Home / Self Care | Payer: Medicare Other | Source: Ambulatory Visit | Attending: Radiation Oncology | Admitting: Radiation Oncology

## 2021-11-30 DIAGNOSIS — Z5111 Encounter for antineoplastic chemotherapy: Secondary | ICD-10-CM | POA: Diagnosis not present

## 2021-11-30 LAB — RAD ONC ARIA SESSION SUMMARY
Course Elapsed Days: 3
Plan Fractions Treated to Date: 4
Plan Prescribed Dose Per Fraction: 2 Gy
Plan Total Fractions Prescribed: 35
Plan Total Prescribed Dose: 70 Gy
Reference Point Dosage Given to Date: 8 Gy
Reference Point Session Dosage Given: 2 Gy
Session Number: 4

## 2021-12-01 ENCOUNTER — Ambulatory Visit
Admission: RE | Admit: 2021-12-01 | Discharge: 2021-12-01 | Disposition: A | Discharge status: Home / Self Care | Payer: Medicare Other | Source: Ambulatory Visit | Attending: Radiation Oncology | Admitting: Radiation Oncology

## 2021-12-01 ENCOUNTER — Other Ambulatory Visit: Payer: Self-pay

## 2021-12-01 ENCOUNTER — Inpatient Hospital Stay: Payer: Medicare Other

## 2021-12-01 DIAGNOSIS — Z5111 Encounter for antineoplastic chemotherapy: Secondary | ICD-10-CM | POA: Diagnosis not present

## 2021-12-01 LAB — RAD ONC ARIA SESSION SUMMARY
Course Elapsed Days: 4
Plan Fractions Treated to Date: 5
Plan Prescribed Dose Per Fraction: 2 Gy
Plan Total Fractions Prescribed: 35
Plan Total Prescribed Dose: 70 Gy
Reference Point Dosage Given to Date: 10 Gy
Reference Point Session Dosage Given: 2 Gy
Session Number: 5

## 2021-12-01 MED FILL — Dexamethasone Sodium Phosphate Inj 100 MG/10ML: INTRAMUSCULAR | Qty: 1 | Status: AC

## 2021-12-04 ENCOUNTER — Ambulatory Visit
Admission: RE | Admit: 2021-12-04 | Discharge: 2021-12-04 | Disposition: A | Discharge status: Home / Self Care | Payer: Medicare Other | Source: Ambulatory Visit | Attending: Radiation Oncology | Admitting: Radiation Oncology

## 2021-12-04 ENCOUNTER — Inpatient Hospital Stay: Payer: Medicare Other

## 2021-12-04 ENCOUNTER — Encounter: Payer: Self-pay | Admitting: *Deleted

## 2021-12-04 ENCOUNTER — Other Ambulatory Visit: Payer: Self-pay

## 2021-12-04 ENCOUNTER — Ambulatory Visit: Payer: Medicare Other | Admitting: Oncology

## 2021-12-04 VITALS — BP 109/63 | HR 98 | Temp 99.8°F | Resp 16 | Wt 75.6 lb

## 2021-12-04 DIAGNOSIS — C342 Malignant neoplasm of middle lobe, bronchus or lung: Secondary | ICD-10-CM

## 2021-12-04 DIAGNOSIS — Z5111 Encounter for antineoplastic chemotherapy: Secondary | ICD-10-CM | POA: Diagnosis not present

## 2021-12-04 LAB — COMPREHENSIVE METABOLIC PANEL
ALT: 26 U/L (ref 0–44)
AST: 24 U/L (ref 15–41)
Albumin: 2 g/dL — ABNORMAL LOW (ref 3.5–5.0)
Alkaline Phosphatase: 74 U/L (ref 38–126)
Anion gap: 7 (ref 5–15)
BUN: 19 mg/dL (ref 8–23)
CO2: 29 mmol/L (ref 22–32)
Calcium: 7.7 mg/dL — ABNORMAL LOW (ref 8.9–10.3)
Chloride: 98 mmol/L (ref 98–111)
Creatinine, Ser: 0.57 mg/dL (ref 0.44–1.00)
GFR, Estimated: >60 mL/min (ref >60)
Glucose, Bld: 140 mg/dL — ABNORMAL HIGH (ref 70–99)
Potassium: 3.5 mmol/L (ref 3.5–5.1)
Sodium: 134 mmol/L — ABNORMAL LOW (ref 135–145)
Total Bilirubin: 0.4 mg/dL (ref 0.3–1.2)
Total Protein: 4.6 g/dL — ABNORMAL LOW (ref 6.5–8.1)

## 2021-12-04 LAB — RAD ONC ARIA SESSION SUMMARY
Course Elapsed Days: 7
Plan Fractions Treated to Date: 6
Plan Prescribed Dose Per Fraction: 2 Gy
Plan Total Fractions Prescribed: 35
Plan Total Prescribed Dose: 70 Gy
Reference Point Dosage Given to Date: 12 Gy
Reference Point Session Dosage Given: 2 Gy
Session Number: 6

## 2021-12-04 LAB — CBC WITH DIFFERENTIAL/PLATELET
Abs Immature Granulocytes: 0.11 10*3/uL — ABNORMAL HIGH (ref 0.00–0.07)
Basophils Absolute: 0 10*3/uL (ref 0.0–0.1)
Basophils Relative: 0 %
Eosinophils Absolute: 0.1 10*3/uL (ref 0.0–0.5)
Eosinophils Relative: 1 %
HCT: 32.8 % — ABNORMAL LOW (ref 36.0–46.0)
Hemoglobin: 10.2 g/dL — ABNORMAL LOW (ref 12.0–15.0)
Immature Granulocytes: 2 %
Lymphocytes Relative: 6 %
Lymphs Abs: 0.4 10*3/uL — ABNORMAL LOW (ref 0.7–4.0)
MCH: 24.8 pg — ABNORMAL LOW (ref 26.0–34.0)
MCHC: 31.1 g/dL (ref 30.0–36.0)
MCV: 79.8 fL — ABNORMAL LOW (ref 80.0–100.0)
Monocytes Absolute: 0.7 10*3/uL (ref 0.1–1.0)
Monocytes Relative: 9 %
Neutro Abs: 6.2 10*3/uL (ref 1.7–7.7)
Neutrophils Relative %: 82 %
Platelets: 396 10*3/uL (ref 150–400)
RBC: 4.11 MIL/uL (ref 3.87–5.11)
RDW: 16.6 % — ABNORMAL HIGH (ref 11.5–15.5)
WBC: 7.6 10*3/uL (ref 4.0–10.5)
nRBC: 0 % (ref 0.0–0.2)

## 2021-12-04 MED ORDER — DIPHENHYDRAMINE HCL 50 MG/ML IJ SOLN
50.0000 mg | Freq: Once | INTRAMUSCULAR | Status: AC
  Administered 2021-12-04: 50 mg via INTRAVENOUS
  Filled 2021-12-04: qty 1

## 2021-12-04 MED ORDER — HEPARIN SOD (PORK) LOCK FLUSH 100 UNIT/ML IV SOLN
500.0000 [IU] | Freq: Once | INTRAVENOUS | Status: AC | PRN
  Administered 2021-12-04: 500 [IU]
  Filled 2021-12-04: qty 5

## 2021-12-04 MED ORDER — SODIUM CHLORIDE 0.9 % IV SOLN
Freq: Once | INTRAVENOUS | Status: AC
  Filled 2021-12-04: qty 250

## 2021-12-04 MED ORDER — SODIUM CHLORIDE 0.9% FLUSH
10.0000 mL | INTRAVENOUS | Status: DC | PRN
  Filled 2021-12-04: qty 10

## 2021-12-04 MED ORDER — SODIUM CHLORIDE 0.9 % IV SOLN
45.0000 mg/m2 | Freq: Once | INTRAVENOUS | Status: AC
  Administered 2021-12-04: 54 mg via INTRAVENOUS
  Filled 2021-12-04: qty 9

## 2021-12-04 MED ORDER — PALONOSETRON HCL INJECTION 0.25 MG/5ML
0.2500 mg | Freq: Once | INTRAVENOUS | Status: AC
  Administered 2021-12-04: 0.25 mg via INTRAVENOUS
  Filled 2021-12-04: qty 5

## 2021-12-04 MED ORDER — SODIUM CHLORIDE 0.9 % IV SOLN
110.0000 mg | Freq: Once | INTRAVENOUS | Status: AC
  Administered 2021-12-04: 110 mg via INTRAVENOUS
  Filled 2021-12-04: qty 11

## 2021-12-04 MED ORDER — FAMOTIDINE IN NACL 20-0.9 MG/50ML-% IV SOLN
20.0000 mg | Freq: Once | INTRAVENOUS | Status: AC
  Administered 2021-12-04: 20 mg via INTRAVENOUS
  Filled 2021-12-04: qty 50

## 2021-12-04 MED ORDER — HEPARIN SOD (PORK) LOCK FLUSH 100 UNIT/ML IV SOLN
INTRAVENOUS | Status: AC
  Filled 2021-12-04: qty 5

## 2021-12-04 MED ORDER — SODIUM CHLORIDE 0.9 % IV SOLN
10.0000 mg | Freq: Once | INTRAVENOUS | Status: AC
  Administered 2021-12-04: 10 mg via INTRAVENOUS
  Filled 2021-12-04: qty 10

## 2021-12-04 NOTE — Patient Instructions (Signed)
[facility] AT [place]  Discharge Instructions: Thank you for choosing [facility] to provide your oncology and hematology care.  If you have a lab appointment with the [facility], please go directly to the [facility] and check in at the registration area.  Wear comfortable clothing and clothing appropriate for easy access to any Portacath or PICC line.   We strive to give you quality time with your provider. You may need to reschedule your appointment if you arrive late (15 or more minutes).  Arriving late affects you and other patients whose appointments are after yours.  Also, if you miss three or more appointments without notifying the office, you may be dismissed from the clinic at the provider's discretion.      For prescription refill requests, have your pharmacy contact our office and allow 72 hours for refills to be completed.    Today you received the following chemotherapy and/or immunotherapy agents- Taxol, Carboplatin       To help prevent nausea and vomiting after your treatment, we encourage you to take your nausea medication as directed.  BELOW ARE SYMPTOMS THAT SHOULD BE REPORTED IMMEDIATELY: *FEVER GREATER THAN 100.4 F (38 C) OR HIGHER *CHILLS OR SWEATING *NAUSEA AND VOMITING THAT IS NOT CONTROLLED WITH YOUR NAUSEA MEDICATION *UNUSUAL SHORTNESS OF BREATH *UNUSUAL BRUISING OR BLEEDING *URINARY PROBLEMS (pain or burning when urinating, or frequent urination) *BOWEL PROBLEMS (unusual diarrhea, constipation, pain near the anus) TENDERNESS IN MOUTH AND THROAT WITH OR WITHOUT PRESENCE OF ULCERS (sore throat, sores in mouth, or a toothache) UNUSUAL RASH, SWELLING OR PAIN  UNUSUAL VAGINAL DISCHARGE OR ITCHING   Items with * indicate a potential emergency and should be followed up as soon as possible or go to the Emergency Department if any problems should occur.  Please show the CHEMOTHERAPY ALERT CARD or IMMUNOTHERAPY ALERT CARD at  check-in to the Emergency Department and triage nurse.  Should you have questions after your visit or need to cancel or reschedule your appointment, please contact [facility] [place] AT [place]  [phone] and follow the prompts.  Office hours are 8:00 a.m. to 4:30 p.m. Monday - Friday. Please note that voicemails left after 4:00 p.m. may not be returned until the following business day.  We are closed weekends and major holidays. You have access to a nurse at all times for urgent questions. Please call the main number to the clinic [phone] and follow the prompts.  For any non-urgent questions, you may also contact your provider using MyChart. We now offer e-Visits for anyone 5 and older to request care online for non-urgent symptoms. For details visit [email or web address].   Also download the MyChart app! Go to the app store, search "MyChart", open the app, select [facility], and log in with your MyChart username and password.  Masks are optional in the cancer centers. If you would like for your care team to wear a mask while they are taking care of you, please let them know. For doctor visits, patients may have with them one support person who is at least 75 years old. At this time, visitors are not allowed in the infusion area.  Paclitaxel Injection What is this medication? PACLITAXEL (PAK li TAX el) treats some types of cancer. It works by slowing down the growth of cancer cells. This medicine may be used for other purposes; ask your health care provider or pharmacist if you have questions. COMMON BRAND NAME(S): Onxol, Taxol What should I tell  my care team before I take this medication? They need to know if you have any of these conditions: Heart disease Liver disease Low white blood cell levels An unusual or allergic reaction to paclitaxel, other medications, foods, dyes, or preservatives If you or your partner are pregnant or trying to get  pregnant Breast-feeding How should I use this medication? This medication is injected into a vein. It is given by your care team in a hospital or clinic setting. Talk to your care team about the use of this medication in children. While it may be given to children for selected conditions, precautions do apply. Overdosage: If you think you have taken too much of this medicine contact a poison control center or emergency room at once. NOTE: This medicine is only for you. Do not share this medicine with others. What if I miss a dose? Keep appointments for follow-up doses. It is important not to miss your dose. Call your care team if you are unable to keep an appointment. What may interact with this medication? Do not take this medication with any of the following: Live virus vaccines Other medications may affect the way this medication works. Talk with your care team about all of the medications you take. They may suggest changes to your treatment plan to lower the risk of side effects and to make sure your medications work as intended. This list may not describe all possible interactions. Give your health care provider a list of all the medicines, herbs, non-prescription drugs, or dietary supplements you use. Also tell them if you smoke, drink alcohol, or use illegal drugs. Some items may interact with your medicine. What should I watch for while using this medication? Your condition will be monitored carefully while you are receiving this medication. You may need blood work while taking this medication. This medication may make you feel generally unwell. This is not uncommon as chemotherapy can affect healthy cells as well as cancer cells. Report any side effects. Continue your course of treatment even though you feel ill unless your care team tells you to stop. This medication can cause serious allergic reactions. To reduce the risk, your care team may give you other medications to take before  receiving this one. Be sure to follow the directions from your care team. This medication may increase your risk of getting an infection. Call your care team for advice if you get a fever, chills, sore throat, or other symptoms of a cold or flu. Do not treat yourself. Try to avoid being around people who are sick. This medication may increase your risk to bruise or bleed. Call your care team if you notice any unusual bleeding. Be careful brushing or flossing your teeth or using a toothpick because you may get an infection or bleed more easily. If you have any dental work done, tell your dentist you are receiving this medication. Talk to your care team if you may be pregnant. Serious birth defects can occur if you take this medication during pregnancy. Talk to your care team before breastfeeding. Changes to your treatment plan may be needed. What side effects may I notice from receiving this medication? Side effects that you should report to your care team as soon as possible: Allergic reactions--skin rash, itching, hives, swelling of the face, lips, tongue, or throat Heart rhythm changes--fast or irregular heartbeat, dizziness, feeling faint or lightheaded, chest pain, trouble breathing Increase in blood pressure Infection--fever, chills, cough, sore throat, wounds that don't heal, pain or  trouble when passing urine, general feeling of discomfort or being unwell Low blood pressure--dizziness, feeling faint or lightheaded, blurry vision Low red blood cell level--unusual weakness or fatigue, dizziness, headache, trouble breathing Painful swelling, warmth, or redness of the skin, blisters or sores at the infusion site Pain, tingling, or numbness in the hands or feet Slow heartbeat--dizziness, feeling faint or lightheaded, confusion, trouble breathing, unusual weakness or fatigue Unusual bruising or bleeding Side effects that usually do not require medical attention (report to your care team if they  continue or are bothersome): Diarrhea Hair loss Joint pain Loss of appetite Muscle pain Nausea Vomiting This list may not describe all possible side effects. Call your doctor for medical advice about side effects. You may report side effects to FDA at 1-800-FDA-1088. Where should I keep my medication? This medication is given in a hospital or clinic. It will not be stored at home. NOTE: This sheet is a summary. It may not cover all possible information. If you have questions about this medicine, talk to your doctor, pharmacist, or health care provider.  2023 Elsevier/Gold Standard (2021-05-17 00:00:00)    Carboplatin Injection What is this medication? CARBOPLATIN (KAR boe pla tin) treats some types of cancer. It works by slowing down the growth of cancer cells. This medicine may be used for other purposes; ask your health care provider or pharmacist if you have questions. COMMON BRAND NAME(S): Paraplatin What should I tell my care team before I take this medication? They need to know if you have any of these conditions: Blood disorders Hearing problems Kidney disease Recent or ongoing radiation therapy An unusual or allergic reaction to carboplatin, cisplatin, other medications, foods, dyes, or preservatives Pregnant or trying to get pregnant Breast-feeding How should I use this medication? This medication is injected into a vein. It is given by your care team in a hospital or clinic setting. Talk to your care team about the use of this medication in children. Special care may be needed. Overdosage: If you think you have taken too much of this medicine contact a poison control center or emergency room at once. NOTE: This medicine is only for you. Do not share this medicine with others. What if I miss a dose? Keep appointments for follow-up doses. It is important not to miss your dose. Call your care team if you are unable to keep an appointment. What may interact with this  medication? Medications for seizures Some antibiotics, such as amikacin, gentamicin, neomycin, streptomycin, tobramycin Vaccines This list may not describe all possible interactions. Give your health care provider a list of all the medicines, herbs, non-prescription drugs, or dietary supplements you use. Also tell them if you smoke, drink alcohol, or use illegal drugs. Some items may interact with your medicine. What should I watch for while using this medication? Your condition will be monitored carefully while you are receiving this medication. You may need blood work while taking this medication. This medication may make you feel generally unwell. This is not uncommon, as chemotherapy can affect healthy cells as well as cancer cells. Report any side effects. Continue your course of treatment even though you feel ill unless your care team tells you to stop. In some cases, you may be given additional medications to help with side effects. Follow all directions for their use. This medication may increase your risk of getting an infection. Call your care team for advice if you get a fever, chills, sore throat, or other symptoms of a cold or  flu. Do not treat yourself. Try to avoid being around people who are sick. Avoid taking medications that contain aspirin, acetaminophen, ibuprofen, naproxen, or ketoprofen unless instructed by your care team. These medications may hide a fever. Be careful brushing or flossing your teeth or using a toothpick because you may get an infection or bleed more easily. If you have any dental work done, tell your dentist you are receiving this medication. Talk to your care team if you wish to become pregnant or think you might be pregnant. This medication can cause serious birth defects. Talk to your care team about effective forms of contraception. Do not breast-feed while taking this medication. What side effects may I notice from receiving this medication? Side effects  that you should report to your care team as soon as possible: Allergic reactions--skin rash, itching, hives, swelling of the face, lips, tongue, or throat Infection--fever, chills, cough, sore throat, wounds that don't heal, pain or trouble when passing urine, general feeling of discomfort or being unwell Low red blood cell level--unusual weakness or fatigue, dizziness, headache, trouble breathing Pain, tingling, or numbness in the hands or feet, muscle weakness, change in vision, confusion or trouble speaking, loss of balance or coordination, trouble walking, seizures Unusual bruising or bleeding Side effects that usually do not require medical attention (report to your care team if they continue or are bothersome): Hair loss Nausea Unusual weakness or fatigue Vomiting This list may not describe all possible side effects. Call your doctor for medical advice about side effects. You may report side effects to FDA at 1-800-FDA-1088. Where should I keep my medication? This medication is given in a hospital or clinic. It will not be stored at home. NOTE: This sheet is a summary. It may not cover all possible information. If you have questions about this medicine, talk to your doctor, pharmacist, or health care provider.  2023 Elsevier/Gold Standard (2021-05-01 00:00:00)

## 2021-12-05 ENCOUNTER — Inpatient Hospital Stay: Payer: Medicare Other

## 2021-12-05 ENCOUNTER — Other Ambulatory Visit: Payer: Self-pay

## 2021-12-05 ENCOUNTER — Encounter: Payer: Self-pay | Admitting: *Deleted

## 2021-12-05 ENCOUNTER — Ambulatory Visit
Admission: RE | Admit: 2021-12-05 | Discharge: 2021-12-05 | Disposition: A | Discharge status: Home / Self Care | Payer: Medicare Other | Source: Ambulatory Visit | Attending: Radiation Oncology | Admitting: Radiation Oncology

## 2021-12-05 DIAGNOSIS — Z5111 Encounter for antineoplastic chemotherapy: Secondary | ICD-10-CM | POA: Diagnosis not present

## 2021-12-05 LAB — RAD ONC ARIA SESSION SUMMARY
Course Elapsed Days: 8
Plan Fractions Treated to Date: 7
Plan Prescribed Dose Per Fraction: 2 Gy
Plan Total Fractions Prescribed: 35
Plan Total Prescribed Dose: 70 Gy
Reference Point Dosage Given to Date: 14 Gy
Reference Point Session Dosage Given: 2 Gy
Session Number: 7

## 2021-12-05 NOTE — Progress Notes (Signed)
Nutrition Assessment   Reason for Assessment:   Referral for low protein   ASSESSMENT:  76 year old female with non small cell lung cancer, stage IV. Past medical history of right upper lobectomy (2011), collangenous colitis, COPD, gastric bypass, IDA, stroke, intestinal adhesions.  Patient receiving concurrent chemotherapy and radiation.    Spoke with patient via phone for nutrition assessment.  Patient reports that she has a really good appetite.  Unable to tolerate large volumes of food at one time.  "My stomach will not digest it."  Consumes small frequent mini meals/snacks during the day.  Drinks Fairlife (30 g protein) shake BID.  Able to tolerate eggs cooked any way, pizza (large lasts 3 days), chicken dishes cooked in crockpot, cottage cheese, cheese, peanut butter, peeled apples, cucumber and tomato salad, mandarin oranges and whipped cream, spaghetti.  Stays away from high sugar foods like ice cream).  Denies nausea.  Reports bowel movement daily with probiotic (Necific Biox4) and stool softner.  Takes carafate and has helped with swallowing foods from radiation.    Medications: carafate, compazine, protonix,    Labs: Na 134, glucose 140, albumin 2.0   Anthropometrics:   Height: 60 " Weight: 75 lb 9.9 oz on 11/6 Most she has ever weighed was 93 lb BMI: 14  80 lb 8/18 6% weight loss in the last 3 months  Estimated Energy Needs  Kcals: 1000-1200 Protein: 50-60 g Fluid: 1000-1200 ml   NUTRITION DIAGNOSIS: Inadequate oral intake related to altered GI function (previous surgeries), cancer as evidenced by 6% weight loss in the last 3 months, BMI 14    INTERVENTION:  Recommend adding protein food at every mini meal/snack. Discussed options. Can increase Fairlife shakes for added nutrition Encouraged chopping, grinding, adding moisture to foods for ease of swallowing with radiation. Contact information given to patient   MONITORING, EVALUATION, GOAL: weight trends,  intake   Next Visit: Tuesday, Dec 5 phone call  Donshay Lupinski B. Zenia Resides, [place], [place] Registered Dietitian [phone]

## 2021-12-06 ENCOUNTER — Other Ambulatory Visit: Payer: Self-pay

## 2021-12-06 ENCOUNTER — Encounter: Payer: Self-pay | Admitting: *Deleted

## 2021-12-06 ENCOUNTER — Inpatient Hospital Stay: Payer: Medicare Other

## 2021-12-06 ENCOUNTER — Ambulatory Visit
Admission: RE | Admit: 2021-12-06 | Discharge: 2021-12-06 | Disposition: A | Discharge status: Home / Self Care | Payer: Medicare Other | Source: Ambulatory Visit | Attending: Radiation Oncology | Admitting: Radiation Oncology

## 2021-12-06 DIAGNOSIS — Z5111 Encounter for antineoplastic chemotherapy: Secondary | ICD-10-CM | POA: Diagnosis not present

## 2021-12-06 LAB — RAD ONC ARIA SESSION SUMMARY
Course Elapsed Days: 9
Plan Fractions Treated to Date: 8
Plan Prescribed Dose Per Fraction: 2 Gy
Plan Total Fractions Prescribed: 35
Plan Total Prescribed Dose: 70 Gy
Reference Point Dosage Given to Date: 16 Gy
Reference Point Session Dosage Given: 2 Gy
Session Number: 8

## 2021-12-07 ENCOUNTER — Inpatient Hospital Stay: Payer: Medicare Other

## 2021-12-07 ENCOUNTER — Other Ambulatory Visit: Payer: Self-pay

## 2021-12-07 ENCOUNTER — Encounter: Payer: Self-pay | Admitting: *Deleted

## 2021-12-07 ENCOUNTER — Ambulatory Visit
Admission: RE | Admit: 2021-12-07 | Discharge: 2021-12-07 | Disposition: A | Discharge status: Home / Self Care | Payer: Medicare Other | Source: Ambulatory Visit | Attending: Radiation Oncology | Admitting: Radiation Oncology

## 2021-12-07 DIAGNOSIS — Z5111 Encounter for antineoplastic chemotherapy: Secondary | ICD-10-CM | POA: Diagnosis not present

## 2021-12-07 LAB — RAD ONC ARIA SESSION SUMMARY
Course Elapsed Days: 10
Plan Fractions Treated to Date: 9
Plan Prescribed Dose Per Fraction: 2 Gy
Plan Total Fractions Prescribed: 35
Plan Total Prescribed Dose: 70 Gy
Reference Point Dosage Given to Date: 18 Gy
Reference Point Session Dosage Given: 2 Gy
Session Number: 9

## 2021-12-08 ENCOUNTER — Other Ambulatory Visit: Payer: Self-pay

## 2021-12-08 ENCOUNTER — Ambulatory Visit
Admission: RE | Admit: 2021-12-08 | Discharge: 2021-12-08 | Disposition: A | Discharge status: Home / Self Care | Payer: Medicare Other | Source: Ambulatory Visit | Attending: Radiation Oncology | Admitting: Radiation Oncology

## 2021-12-08 ENCOUNTER — Inpatient Hospital Stay: Payer: Medicare Other

## 2021-12-08 ENCOUNTER — Encounter: Payer: Self-pay | Admitting: *Deleted

## 2021-12-08 DIAGNOSIS — Z5111 Encounter for antineoplastic chemotherapy: Secondary | ICD-10-CM | POA: Diagnosis not present

## 2021-12-08 LAB — RAD ONC ARIA SESSION SUMMARY
Course Elapsed Days: 11
Plan Fractions Treated to Date: 10
Plan Prescribed Dose Per Fraction: 2 Gy
Plan Total Fractions Prescribed: 35
Plan Total Prescribed Dose: 70 Gy
Reference Point Dosage Given to Date: 20 Gy
Reference Point Session Dosage Given: 2 Gy
Session Number: 10

## 2021-12-08 MED FILL — Dexamethasone Sodium Phosphate Inj 100 MG/10ML: INTRAMUSCULAR | Qty: 1 | Status: AC

## 2021-12-11 ENCOUNTER — Inpatient Hospital Stay: Payer: Medicare Other

## 2021-12-11 ENCOUNTER — Encounter: Payer: Self-pay | Admitting: Oncology

## 2021-12-11 ENCOUNTER — Other Ambulatory Visit: Payer: Self-pay

## 2021-12-11 ENCOUNTER — Encounter: Payer: Self-pay | Admitting: *Deleted

## 2021-12-11 ENCOUNTER — Ambulatory Visit
Admission: RE | Admit: 2021-12-11 | Discharge: 2021-12-11 | Disposition: A | Discharge status: Home / Self Care | Payer: Medicare Other | Source: Ambulatory Visit | Attending: Radiation Oncology | Admitting: Radiation Oncology

## 2021-12-11 ENCOUNTER — Inpatient Hospital Stay (HOSPITAL_BASED_OUTPATIENT_CLINIC_OR_DEPARTMENT_OTHER): Payer: Medicare Other | Admitting: Oncology

## 2021-12-11 VITALS — BP 119/62 | HR 106 | Temp 97.4°F | Resp 16 | Wt 74.5 lb

## 2021-12-11 DIAGNOSIS — Z5111 Encounter for antineoplastic chemotherapy: Secondary | ICD-10-CM

## 2021-12-11 DIAGNOSIS — C342 Malignant neoplasm of middle lobe, bronchus or lung: Secondary | ICD-10-CM | POA: Diagnosis not present

## 2021-12-11 DIAGNOSIS — G893 Neoplasm related pain (acute) (chronic): Secondary | ICD-10-CM | POA: Diagnosis not present

## 2021-12-11 LAB — CBC WITH DIFFERENTIAL/PLATELET
Abs Immature Granulocytes: 0.06 10*3/uL (ref 0.00–0.07)
Basophils Absolute: 0 10*3/uL (ref 0.0–0.1)
Basophils Relative: 1 %
Eosinophils Absolute: 0.1 10*3/uL (ref 0.0–0.5)
Eosinophils Relative: 1 %
HCT: 30.2 % — ABNORMAL LOW (ref 36.0–46.0)
Hemoglobin: 9.4 g/dL — ABNORMAL LOW (ref 12.0–15.0)
Immature Granulocytes: 1 %
Lymphocytes Relative: 5 %
Lymphs Abs: 0.3 10*3/uL — ABNORMAL LOW (ref 0.7–4.0)
MCH: 25 pg — ABNORMAL LOW (ref 26.0–34.0)
MCHC: 31.1 g/dL (ref 30.0–36.0)
MCV: 80.3 fL (ref 80.0–100.0)
Monocytes Absolute: 0.8 10*3/uL (ref 0.1–1.0)
Monocytes Relative: 11 %
Neutro Abs: 5.5 10*3/uL (ref 1.7–7.7)
Neutrophils Relative %: 81 %
Platelets: 369 10*3/uL (ref 150–400)
RBC: 3.76 MIL/uL — ABNORMAL LOW (ref 3.87–5.11)
RDW: 18 % — ABNORMAL HIGH (ref 11.5–15.5)
WBC: 6.8 10*3/uL (ref 4.0–10.5)
nRBC: 0 % (ref 0.0–0.2)

## 2021-12-11 LAB — RAD ONC ARIA SESSION SUMMARY
Course Elapsed Days: 14
Plan Fractions Treated to Date: 11
Plan Prescribed Dose Per Fraction: 2 Gy
Plan Total Fractions Prescribed: 35
Plan Total Prescribed Dose: 70 Gy
Reference Point Dosage Given to Date: 22 Gy
Reference Point Session Dosage Given: 2 Gy
Session Number: 11

## 2021-12-11 LAB — COMPREHENSIVE METABOLIC PANEL
ALT: 27 U/L (ref 0–44)
AST: 24 U/L (ref 15–41)
Albumin: 2.3 g/dL — ABNORMAL LOW (ref 3.5–5.0)
Alkaline Phosphatase: 62 U/L (ref 38–126)
Anion gap: 9 (ref 5–15)
BUN: 25 mg/dL — ABNORMAL HIGH (ref 8–23)
CO2: 27 mmol/L (ref 22–32)
Calcium: 7.7 mg/dL — ABNORMAL LOW (ref 8.9–10.3)
Chloride: 97 mmol/L — ABNORMAL LOW (ref 98–111)
Creatinine, Ser: 0.48 mg/dL (ref 0.44–1.00)
GFR, Estimated: >60 mL/min (ref >60)
Glucose, Bld: 141 mg/dL — ABNORMAL HIGH (ref 70–99)
Potassium: 3.4 mmol/L — ABNORMAL LOW (ref 3.5–5.1)
Sodium: 133 mmol/L — ABNORMAL LOW (ref 135–145)
Total Bilirubin: 0.4 mg/dL (ref 0.3–1.2)
Total Protein: 4.9 g/dL — ABNORMAL LOW (ref 6.5–8.1)

## 2021-12-11 LAB — IRON AND TIBC
Iron: 24 ug/dL — ABNORMAL LOW (ref 28–170)
Saturation Ratios: 11 % (ref 10.4–31.8)
TIBC: 225 ug/dL — ABNORMAL LOW (ref 250–450)
UIBC: 201 ug/dL

## 2021-12-11 LAB — FERRITIN: Ferritin: 72 ng/mL (ref 11–307)

## 2021-12-11 MED ORDER — HEPARIN SOD (PORK) LOCK FLUSH 100 UNIT/ML IV SOLN
500.0000 [IU] | Freq: Once | INTRAVENOUS | Status: AC | PRN
  Administered 2021-12-11: 500 [IU]
  Filled 2021-12-11: qty 5

## 2021-12-11 MED ORDER — DIPHENHYDRAMINE HCL 50 MG/ML IJ SOLN
50.0000 mg | Freq: Once | INTRAMUSCULAR | Status: AC
  Administered 2021-12-11: 50 mg via INTRAVENOUS
  Filled 2021-12-11: qty 1

## 2021-12-11 MED ORDER — SODIUM CHLORIDE 0.9% FLUSH
10.0000 mL | INTRAVENOUS | Status: DC | PRN
  Administered 2021-12-11: 10 mL
  Filled 2021-12-11: qty 10

## 2021-12-11 MED ORDER — SODIUM CHLORIDE 0.9 % IV SOLN
110.0000 mg | Freq: Once | INTRAVENOUS | Status: AC
  Administered 2021-12-11: 110 mg via INTRAVENOUS
  Filled 2021-12-11: qty 11

## 2021-12-11 MED ORDER — FAMOTIDINE IN NACL 20-0.9 MG/50ML-% IV SOLN
20.0000 mg | Freq: Once | INTRAVENOUS | Status: AC
  Administered 2021-12-11: 20 mg via INTRAVENOUS
  Filled 2021-12-11: qty 50

## 2021-12-11 MED ORDER — PALONOSETRON HCL INJECTION 0.25 MG/5ML
0.2500 mg | Freq: Once | INTRAVENOUS | Status: AC
  Administered 2021-12-11: 0.25 mg via INTRAVENOUS
  Filled 2021-12-11: qty 5

## 2021-12-11 MED ORDER — SODIUM CHLORIDE 0.9 % IV SOLN
10.0000 mg | Freq: Once | INTRAVENOUS | Status: AC
  Administered 2021-12-11: 10 mg via INTRAVENOUS
  Filled 2021-12-11: qty 10

## 2021-12-11 MED ORDER — SODIUM CHLORIDE 0.9 % IV SOLN
Freq: Once | INTRAVENOUS | Status: AC
  Filled 2021-12-11: qty 250

## 2021-12-11 MED ORDER — SODIUM CHLORIDE 0.9 % IV SOLN
45.0000 mg/m2 | Freq: Once | INTRAVENOUS | Status: AC
  Administered 2021-12-11: 54 mg via INTRAVENOUS
  Filled 2021-12-11: qty 9

## 2021-12-11 NOTE — Progress Notes (Signed)
Hematology/Oncology Consult note [facility]  [phone] [phone]  Patient Care Team: Albina Billet, MD as PCP - General (Internal Medicine) Marden Noble, MD (Internal Medicine) Bary Castilla, Forest Gleason, MD (General Surgery) Telford Nab, RN as Oncology Nurse Navigator Noreene Filbert, MD as Referring Physician (Radiation Oncology) Sindy Guadeloupe, MD as Consulting Physician (Oncology)   Name of the patient: Carol Shaffer  [id number]  09-22-1945   Date of visit: 12/11/21  Diagnosis- non-small cell lung cancer involving the right middle lobe stage IV T1b N2 M1    Chief complaint/ Reason for visit-on treatment assessment prior to cycle 3 of weekly CarboTaxol chemotherapy  Heme/Onc history: Patient is a 76 year old female with a history oflung cancer in 2012 s/p right upper lobe resection.  Low dose chest CT on 09/04/2018 revealed multiple small pulmonary nodules are noted in the lungs bilaterally. In addition, there was a 1.53 cm concerning nodule in the right lower lobe. There was diffuse bronchial wall thickening with mild centrilobular and paraseptal emphysema; imaging findings suggestive of underlying COPD.PET scan on 09/16/2018 revealed low level FDG uptake associated with the right lower lobe lung nodule (SUV 1.57). This was a nonspecific finding. Given the size of this nodule and the low level uptake, findings may reflect inflammatory or infectious nodule. Malignancy was not entirely excluded.  Low dose chest CT on 12/04/2018 revealed a 15.3 mm RLL nodule unchanged in size.   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  There were some ground-glass attenuation components, but is predominantly solid in appearance with some internal air bronchograms, with macrolobulated and spiculated borders, clearly  increased in size.   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  Tumor was positive for CK7 and TTF-1 and negative for p40.  She is not a surgical candidate.   She received 6000 cGy SBRT to the RLL from 08/05/2019 - 08/17/2019.   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  History of iron deficiency anemia and she has undergone work-up in the past.  She has a history of gastric bypass surgery.    Patient hadCT scan of breast 2023 which showed narrowing and occlusion of the right middle lobe bronchus which was looking more prominent as compared to prior scans.  There was also concern for paratracheal adenopathy which had increased in size as compared to prior scans.  Patient was seen by pulmonary and underwent bronchoscopy guided biopsy.  Bronchial biopsies of the right middle lobe did not show any malignancy however cytology from station 7 and station 4R lymph node was consistent with adenocarcinoma.    Interval history-reports that her chest wall/back pain is currently well controlled with oxycodone 1 tablet 3 times a day instead of 2 tablets 3 times a day that she was taking previously.  Has some baseline fatigue.  Denies any new complaints at this time  ECOG PS- 1 Pain scale- 3 Opioid associated constipation- no  Review of systems- Review of Systems  Constitutional:  Positive for malaise/fatigue. Negative for chills, fever and weight loss.  HENT:  Negative for congestion, ear discharge and nosebleeds.   Eyes:  Negative for blurred vision.  Respiratory:  Positive for shortness of breath. Negative for cough, hemoptysis, sputum production and wheezing.   Cardiovascular:  Negative for chest pain, palpitations, orthopnea  and claudication.  Gastrointestinal:  Negative for abdominal pain, blood in stool, constipation, diarrhea, heartburn, melena, nausea and vomiting.  Genitourinary:  Negative for dysuria,  flank pain, frequency, hematuria and urgency.  Musculoskeletal:  Negative for back pain, joint pain and myalgias.  Skin:  Negative for rash.  Neurological:  Negative for dizziness, tingling, focal weakness, seizures, weakness and headaches.  Endo/Heme/Allergies:  Does not bruise/bleed easily.  Psychiatric/Behavioral:  Negative for depression and suicidal ideas. The patient does not have insomnia.       Allergies  Allergen Reactions   Hydromorphone Hcl Itching   Bentyl [Dicyclomine] Nausea And Vomiting   Biaxin [Clarithromycin] Other (See Comments)    hallucinations   Budesonide Other (See Comments)    Ulcer   Erythromycin Nausea And Vomiting   Reglan [Metoclopramide] Other (See Comments)    tremors     Past Medical History:  Diagnosis Date   Arthritis    hands   Avascular necrosis of hip, right ([place])    Blepharospasm    Cancer ([place]) 2011   Right Upper Lobe Lobectomy   Chronic diarrhea    Chronic diarrhea    Collagenous colitis    Complication of anesthesia    usually wakes up during procedures  (endoscopy and colonoscopy)   COPD (chronic obstructive pulmonary disease) ([place])    COVID 2022   Depression    Diverticulosis    Gastric outlet obstruction    Headache    every couple of days   Hemorrhoid    History of Crohn's disease    Hypoglycemic disorder    IDA (iron deficiency anemia)    Intestinal adhesions    Multiple gastric ulcers    Multiple gastric ulcers    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[place])    Stroke ([place]) 8 yrs ago   double vision left eye     Past Surgical History:  Procedure Laterality Date   APPENDECTOMY  1989   botox injections for blepharospasm     BREAST SURGERY     CATARACT EXTRACTION     COLON RESECTION  2005   COLONOSCOPY  10-29-2008   [name]   COLONOSCOPY WITH PROPOFOL N/A 03/05/2017   Procedure: COLONOSCOPY WITH PROPOFOL;  Surgeon: Toledo, Benay Pike, MD;  Location: [facility];  Service: Gastroenterology;  Laterality:  N/A;   COLOSTOMY REVERSAL  2006   ESOPHAGOGASTRODUODENOSCOPY (EGD) WITH PROPOFOL N/A 07/18/2016   Procedure: ESOPHAGOGASTRODUODENOSCOPY (EGD) WITH PROPOFOL;  Surgeon: Robert Bellow, MD;  Location: [facility];  Service: Endoscopy;  Laterality: N/A;   ESOPHAGOGASTRODUODENOSCOPY (EGD) WITH PROPOFOL N/A 08/03/2016   Procedure: ESOPHAGOGASTRODUODENOSCOPY (EGD) WITH PROPOFOL;  Surgeon: Robert Bellow, MD;  Location: [facility];  Service: Endoscopy;  Laterality: N/A;   ESOPHAGOGASTRODUODENOSCOPY (EGD) WITH PROPOFOL N/A 09/01/2018   Procedure: ESOPHAGOGASTRODUODENOSCOPY (EGD) WITH PROPOFOL;  Surgeon: Toledo, Benay Pike, MD;  Location: [facility];  Service: Gastroenterology;  Laterality: N/A;   ESOPHAGOGASTRODUODENOSCOPY (EGD) WITH PROPOFOL N/A 12/01/2018   Procedure: ESOPHAGOGASTRODUODENOSCOPY (EGD) WITH PROPOFOL;  Surgeon: Toledo, Benay Pike, MD;  Location: [facility];  Service: Gastroenterology;  Laterality: N/A;   ESOPHAGOGASTRODUODENOSCOPY (EGD) WITH PROPOFOL N/A 02/23/2019   Procedure: ESOPHAGOGASTRODUODENOSCOPY (EGD) WITH PROPOFOL;  Surgeon: Lucilla Lame, MD;  Location: [place];  Service: Endoscopy;  Laterality: N/A;   ESOPHAGOSCOPY WITH DILITATION  2012,2015   [facility] [place]   EYE SURGERY     FLEXIBLE BRONCHOSCOPY N/A 10/30/2021   Procedure: FLEXIBLE BRONCHOSCOPY;  Surgeon: Armando Reichert, MD;  Location: [facility];  Service: Pulmonary;  Laterality: N/A;  GASTRECTOMY     gastric ulcer  1989, 1991   IR IMAGING GUIDED PORT INSERTION  11/14/2021   JOINT REPLACEMENT     right total hip arthroplasty 03/03/02   LAPAROSCOPIC LYSIS OF ADHESIONS     LUNG CANCER SURGERY  2011   LYSIS OF ADHESION     PLACEMENT OF BREAST IMPLANTS  1973   thoracotomy with right upper lobectomy and central compartment node dissection     UPPER GASTROINTESTINAL ENDOSCOPY  10-29-2008   [name]   VIDEO BRONCHOSCOPY WITH ENDOBRONCHIAL ULTRASOUND N/A 10/30/2021   Procedure: VIDEO BRONCHOSCOPY WITH  ENDOBRONCHIAL ULTRASOUND;  Surgeon: Armando Reichert, MD;  Location: [facility];  Service: Pulmonary;  Laterality: N/A;    Social History   Socioeconomic History   Marital status: Widowed    Spouse name: Not on file   Number of children: 2   Years of education: Not on file   Highest education level: Not on file  Occupational History   Occupation: retired    Comment: Pharmacist, hospital  Tobacco Use   Smoking status: Former    Packs/day: 1.00    Years: 25.00    Total pack years: 25.00    Types: Cigarettes    Quit date: 01/30/2008    Years since quitting: 13.8   Smokeless tobacco: Never  Vaping Use   Vaping Use: Never used  Substance and Sexual Activity   Alcohol use: Yes    Comment: occ. for holiday   Drug use: No   Sexual activity: Not Currently  Other Topics Concern   Not on file  Social History Narrative   Lives alone    Social Determinants of Health   Financial Resource Strain: Not on file  Food Insecurity: Not on file  Transportation Needs: Unmet Transportation Needs (12/11/2021)   PRAPARE - Hydrologist (Medical): Yes    Lack of Transportation (Non-Medical): Yes  Physical Activity: Not on file  Stress: Not on file  Social Connections: Not on file  Intimate Partner Violence: Not on file    History reviewed. No pertinent family history.   Current Outpatient Medications:    acetaminophen (TYLENOL) 500 MG tablet, Take 500 mg by mouth every 6 (six) hours as needed., Disp: , Rfl:    brimonidine-timolol (COMBIGAN) 0.2-0.5 % ophthalmic solution, INT 1 GTT IN OU BID, Disp: , Rfl:    Cholecalciferol (VITAMIN D3) 1000 units CAPS, Take 1 capsule by mouth daily., Disp: , Rfl:    dexamethasone (DECADRON) 4 MG tablet, Take 2 tablets daily for 2 days, start the day after chemotherapy. Take with food., Disp: 30 tablet, Rfl: 1   docusate (COLACE) 50 MG/5ML liquid, Take by mouth daily., Disp: , Rfl:    fluticasone (FLONASE) 50 MCG/ACT nasal spray, Place 2 sprays  into both nostrils daily., Disp: , Rfl:    lidocaine-prilocaine (EMLA) cream, Apply to affected area once, Disp: 30 g, Rfl: 3   loratadine (CLARITIN) 10 MG tablet, Take 10 mg by mouth daily., Disp: , Rfl:    Multiple Vitamin (MULTI-VITAMINS) TABS, Take 1 tablet by mouth daily. once daily., Disp: , Rfl:    NON FORMULARY, Take 1 Dose by mouth daily. BONE BROTH, Disp: , Rfl:    oxyCODONE (OXY IR/ROXICODONE) 5 MG immediate release tablet, Take 2 tablets (10 mg total) by mouth 3 (three) times daily as needed for severe pain., Disp: 180 tablet, Rfl: 0   pantoprazole (PROTONIX) 40 MG tablet, Take 40 mg by mouth 2 (two) times daily. ,  Disp: , Rfl:    sucralfate (CARAFATE) 1 G tablet, Take 1 g by mouth 4 (four) times daily. , Disp: , Rfl:    traZODone (DESYREL) 150 MG tablet, Take 300 mg by mouth at bedtime. , Disp: , Rfl:    venlafaxine XR (EFFEXOR-XR) 150 MG 24 hr capsule, Take 150 mg by mouth daily with breakfast. , Disp: , Rfl:    ondansetron (ZOFRAN) 8 MG tablet, Take 1 tablet (8 mg total) by mouth every 8 (eight) hours as needed for nausea or vomiting. Start on the third day after chemotherapy. (Patient not taking: Reported on 11/27/2021), Disp: 30 tablet, Rfl: 1   prochlorperazine (COMPAZINE) 10 MG tablet, Take 1 tablet (10 mg total) by mouth every 6 (six) hours as needed for nausea or vomiting. (Patient not taking: Reported on 11/27/2021), Disp: 30 tablet, Rfl: 1 No current facility-administered medications for this visit.  Facility-Administered Medications Ordered in Other Visits:    heparin lock flush 100 UNIT/ML injection, , , ,    sodium chloride flush (NS) 0.9 % injection 10 mL, 10 mL, Intracatheter, PRN, Sindy Guadeloupe, MD   sodium chloride flush (NS) 0.9 % injection 10 mL, 10 mL, Intracatheter, PRN, Sindy Guadeloupe, MD, 10 mL at 12/11/21 1332  Physical exam:  Vitals:   12/11/21 0902  BP: 119/62  Pulse: (!) 106  Resp: 16  Temp: (!) 97.4 F (36.3 C)  SpO2: 94%  Weight: 74 lb 8 oz (33.8  kg)   Physical Exam Constitutional:      General: She is not in acute distress. Cardiovascular:     Rate and Rhythm: Normal rate and regular rhythm.     Heart sounds: Normal heart sounds.  Pulmonary:     Effort: Pulmonary effort is normal.     Breath sounds: Normal breath sounds.  Abdominal:     General: Bowel sounds are normal.     Palpations: Abdomen is soft.  Skin:    General: Skin is warm and dry.  Neurological:     Mental Status: She is alert and oriented to person, place, and time.         Latest Ref Rng & Units 12/11/2021    8:43 AM  CMP  Glucose 70 - 99 mg/dL 141   BUN 8 - 23 mg/dL 25   Creatinine 0.44 - 1.00 mg/dL 0.48   Sodium 135 - 145 mmol/L 133   Potassium 3.5 - 5.1 mmol/L 3.4   Chloride 98 - 111 mmol/L 97   CO2 22 - 32 mmol/L 27   Calcium 8.9 - 10.3 mg/dL 7.7   Total Protein 6.5 - 8.1 g/dL 4.9   Total Bilirubin 0.3 - 1.2 mg/dL 0.4   Alkaline Phos 38 - 126 U/L 62   AST 15 - 41 U/L 24   ALT 0 - 44 U/L 27       Latest Ref Rng & Units 12/11/2021    8:43 AM  CBC  WBC 4.0 - 10.5 K/uL 6.8   Hemoglobin 12.0 - 15.0 g/dL 9.4   Hematocrit 36.0 - 46.0 % 30.2   Platelets 150 - 400 K/uL 369     No images are attached to the encounter.  IR IMAGING GUIDED PORT INSERTION  Result Date: 11/14/2021 INDICATION: 76 year old female with history of advanced stage lung cancer requiring central venous access for chemotherapy administration EXAM: IMPLANTED PORT A CATH PLACEMENT WITH ULTRASOUND AND FLUOROSCOPIC GUIDANCE COMPARISON:  None Available. MEDICATIONS: None. ANESTHESIA/SEDATION: Moderate (conscious) sedation was employed during  this procedure. A total of Versed 1.5 mg and Fentanyl 75 mcg was administered intravenously. Moderate Sedation Time: 14 minutes. The patient's level of consciousness and vital signs were monitored continuously by radiology nursing throughout the procedure under my direct supervision. CONTRAST:  None FLUOROSCOPY TIME:  3.3 mGy COMPLICATIONS:  None immediate. PROCEDURE: The procedure, risks, benefits, and alternatives were explained to the patient. Questions regarding the procedure were encouraged and answered. The patient understands and consents to the procedure. The right neck and chest were prepped with chlorhexidine in a sterile fashion, and a sterile drape was applied covering the operative field. Maximum barrier sterile technique with sterile gowns and gloves were used for the procedure. A timeout was performed prior to the initiation of the procedure. Ultrasound was used to examine the jugular vein which was compressible and free of internal echoes. A skin marker was used to demarcate the planned venotomy and port pocket incision sites. Local anesthesia was provided to these sites and the subcutaneous tunnel track with 1% lidocaine with 1:100,000 epinephrine. A small incision was created at the jugular access site and blunt dissection was performed of the subcutaneous tissues. Under ultrasound guidance, the jugular vein was accessed with a 21 ga micropuncture needle and an 0.018" wire was inserted to the superior vena cava. Real-time ultrasound guidance was utilized for vascular access including the acquisition of a permanent ultrasound image documenting patency of the accessed vessel. A 5 Fr micopuncture set was then used, through which a 0.035" Rosen wire was passed under fluoroscopic guidance into the inferior vena cava. An 8 Fr dilator was then placed over the wire. A subcutaneous port pocket was then created along the upper chest wall utilizing a combination of sharp and blunt dissection. The pocket was irrigated with sterile saline, packed with gauze, and observed for hemorrhage. A single lumen [employer] power injectable port was chosen for placement. The 8 Fr catheter was tunneled from the port pocket site to the venotomy incision. The port was placed in the pocket. The external catheter was trimmed to appropriate length. The dilator  was exchanged for an 8 Fr peel-away sheath under fluoroscopic guidance. The catheter was then placed through the sheath and the sheath was removed. Final catheter positioning was confirmed and documented with a fluoroscopic spot radiograph. The port was accessed with a Huber needle, aspirated, and flushed with heparinized saline. The deep dermal layer of the port pocket incision was closed with interrupted 3-0 Vicryl suture. Dermabond was then placed over the port pocket and neck incisions. The patient tolerated the procedure well without immediate post procedural complication. FINDINGS: After catheter placement, the tip lies within the superior cavoatrial junction. The catheter aspirates and flushes normally and is ready for immediate use. IMPRESSION: Successful placement of a power injectable Port-A-Cath via the right internal jugular vein. The catheter is ready for immediate use. Ruthann Cancer, MD [facility] Radiology Electronically Signed   By: Ruthann Cancer M.D.   On: 11/14/2021 14:19     Assessment and plan- Patient is a 76 y.o. female with history of non-small cell lung cancer involving the right middle lobe stage IV T1b N2 M1.  She is here for on treatment assessment prior to cycle 2 of weekly CarboTaxol chemotherapy  Counts okay to proceed with cycle 3 of weekly CarboTaxol chemotherapy today.  She will directly proceed for cycle 4 next week and I will see him back in 2 weeks for cycle 5.  Patient has an isolated  T2 lesion hypermetabolic on PET scan which was the presumed site of metastatic disease.  She is currently receiving palliative radiation to that area.  I am considering to treat her as a stage III disease with concurrent chemoradiation followed by maintenance durvalumab  Neoplasm related chest wall pain: Continue as needed oxycodone   Visit Diagnosis 1. Non-small cell cancer of middle lobe of right lung ([place])   2. Neoplasm related pain    3. Encounter for antineoplastic chemotherapy      Dr. Randa Evens, MD, MPH [facility] [phone] 12/11/2021 3:07 PM

## 2021-12-11 NOTE — Patient Instructions (Signed)
[facility] CANCER CTR AT [place]  Discharge Instructions: Thank you for choosing [facility] to provide your oncology and hematology care.  If you have a lab appointment with the [facility], please go directly to the [place] and check in at the registration area.  Wear comfortable clothing and clothing appropriate for easy access to any Portacath or PICC line.   We strive to give you quality time with your provider. You may need to reschedule your appointment if you arrive late (15 or more minutes).  Arriving late affects you and other patients whose appointments are after yours.  Also, if you miss three or more appointments without notifying the office, you may be dismissed from the clinic at the provider's discretion.      For prescription refill requests, have your pharmacy contact our office and allow 72 hours for refills to be completed.    Today you received the following chemotherapy and/or immunotherapy agents taxol, carboplatin      To help prevent nausea and vomiting after your treatment, we encourage you to take your nausea medication as directed.  BELOW ARE SYMPTOMS THAT SHOULD BE REPORTED IMMEDIATELY: *FEVER GREATER THAN 100.4 F (38 C) OR HIGHER *CHILLS OR SWEATING *NAUSEA AND VOMITING THAT IS NOT CONTROLLED WITH YOUR NAUSEA MEDICATION *UNUSUAL SHORTNESS OF BREATH *UNUSUAL BRUISING OR BLEEDING *URINARY PROBLEMS (pain or burning when urinating, or frequent urination) *BOWEL PROBLEMS (unusual diarrhea, constipation, pain near the anus) TENDERNESS IN MOUTH AND THROAT WITH OR WITHOUT PRESENCE OF ULCERS (sore throat, sores in mouth, or a toothache) UNUSUAL RASH, SWELLING OR PAIN  UNUSUAL VAGINAL DISCHARGE OR ITCHING   Items with * indicate a potential emergency and should be followed up as soon as possible or go to the Emergency Department if any problems should occur.  Please show the CHEMOTHERAPY ALERT CARD or IMMUNOTHERAPY ALERT CARD at  check-in to the Emergency Department and triage nurse.  Should you have questions after your visit or need to cancel or reschedule your appointment, please contact [facility] CANCER [place] AT [place]  [phone] and follow the prompts.  Office hours are 8:00 a.m. to 4:30 p.m. Monday - Friday. Please note that voicemails left after 4:00 p.m. may not be returned until the following business day.  We are closed weekends and major holidays. You have access to a nurse at all times for urgent questions. Please call the main number to the clinic [phone] and follow the prompts.  For any non-urgent questions, you may also contact your provider using MyChart. We now offer e-Visits for anyone 69 and older to request care online for non-urgent symptoms. For details visit [email or web address].   Also download the MyChart app! Go to the app store, search "MyChart", open the app, select [place], and log in with your MyChart username and password.  Masks are optional in the cancer centers. If you would like for your care team to wear a mask while they are taking care of you, please let them know. For doctor visits, patients may have with them one support person who is at least 75 years old. At this time, visitors are not allowed in the infusion area.

## 2021-12-12 ENCOUNTER — Ambulatory Visit
Admission: RE | Admit: 2021-12-12 | Discharge: 2021-12-12 | Disposition: A | Discharge status: Home / Self Care | Payer: Medicare Other | Source: Ambulatory Visit | Attending: Radiation Oncology | Admitting: Radiation Oncology

## 2021-12-12 ENCOUNTER — Other Ambulatory Visit: Payer: Self-pay

## 2021-12-12 ENCOUNTER — Inpatient Hospital Stay: Payer: Medicare Other

## 2021-12-12 DIAGNOSIS — Z5111 Encounter for antineoplastic chemotherapy: Secondary | ICD-10-CM | POA: Diagnosis not present

## 2021-12-12 LAB — RAD ONC ARIA SESSION SUMMARY
Course Elapsed Days: 15
Plan Fractions Treated to Date: 12
Plan Prescribed Dose Per Fraction: 2 Gy
Plan Total Fractions Prescribed: 35
Plan Total Prescribed Dose: 70 Gy
Reference Point Dosage Given to Date: 24 Gy
Reference Point Session Dosage Given: 2 Gy
Session Number: 12

## 2021-12-12 MED FILL — Ferumoxytol Inj 510 MG/17ML (30 MG/ML) (Elemental Fe): INTRAVENOUS | Qty: 17 | Status: AC

## 2021-12-13 ENCOUNTER — Ambulatory Visit
Admission: RE | Admit: 2021-12-13 | Discharge: 2021-12-13 | Disposition: A | Discharge status: Home / Self Care | Payer: Medicare Other | Source: Ambulatory Visit | Attending: Radiation Oncology | Admitting: Radiation Oncology

## 2021-12-13 ENCOUNTER — Other Ambulatory Visit: Payer: Self-pay

## 2021-12-13 ENCOUNTER — Inpatient Hospital Stay: Payer: Medicare Other

## 2021-12-13 ENCOUNTER — Encounter: Payer: Self-pay | Admitting: *Deleted

## 2021-12-13 DIAGNOSIS — Z5111 Encounter for antineoplastic chemotherapy: Secondary | ICD-10-CM | POA: Diagnosis not present

## 2021-12-13 LAB — RAD ONC ARIA SESSION SUMMARY
Course Elapsed Days: 16
Plan Fractions Treated to Date: 13
Plan Prescribed Dose Per Fraction: 2 Gy
Plan Total Fractions Prescribed: 35
Plan Total Prescribed Dose: 70 Gy
Reference Point Dosage Given to Date: 26 Gy
Reference Point Session Dosage Given: 2 Gy
Session Number: 13

## 2021-12-14 ENCOUNTER — Other Ambulatory Visit: Payer: Self-pay

## 2021-12-14 ENCOUNTER — Ambulatory Visit
Admission: RE | Admit: 2021-12-14 | Discharge: 2021-12-14 | Disposition: A | Discharge status: Home / Self Care | Payer: Medicare Other | Source: Ambulatory Visit | Attending: Radiation Oncology | Admitting: Radiation Oncology

## 2021-12-14 ENCOUNTER — Inpatient Hospital Stay: Payer: Medicare Other

## 2021-12-14 ENCOUNTER — Encounter: Payer: Self-pay | Admitting: *Deleted

## 2021-12-14 DIAGNOSIS — Z5111 Encounter for antineoplastic chemotherapy: Secondary | ICD-10-CM | POA: Diagnosis not present

## 2021-12-14 LAB — RAD ONC ARIA SESSION SUMMARY
Course Elapsed Days: 17
Plan Fractions Treated to Date: 14
Plan Prescribed Dose Per Fraction: 2 Gy
Plan Total Fractions Prescribed: 35
Plan Total Prescribed Dose: 70 Gy
Reference Point Dosage Given to Date: 28 Gy
Reference Point Session Dosage Given: 2 Gy
Session Number: 14

## 2021-12-15 ENCOUNTER — Encounter: Payer: Self-pay | Admitting: *Deleted

## 2021-12-15 ENCOUNTER — Inpatient Hospital Stay: Payer: Medicare Other

## 2021-12-15 ENCOUNTER — Ambulatory Visit
Admission: RE | Admit: 2021-12-15 | Discharge: 2021-12-15 | Disposition: A | Discharge status: Home / Self Care | Payer: Medicare Other | Source: Ambulatory Visit | Attending: Radiation Oncology | Admitting: Radiation Oncology

## 2021-12-15 ENCOUNTER — Other Ambulatory Visit: Payer: Self-pay

## 2021-12-15 DIAGNOSIS — Z5111 Encounter for antineoplastic chemotherapy: Secondary | ICD-10-CM | POA: Diagnosis not present

## 2021-12-15 LAB — RAD ONC ARIA SESSION SUMMARY
Course Elapsed Days: 18
Plan Fractions Treated to Date: 15
Plan Prescribed Dose Per Fraction: 2 Gy
Plan Total Fractions Prescribed: 35
Plan Total Prescribed Dose: 70 Gy
Reference Point Dosage Given to Date: 30 Gy
Reference Point Session Dosage Given: 2 Gy
Session Number: 15

## 2021-12-15 MED FILL — Dexamethasone Sodium Phosphate Inj 100 MG/10ML: INTRAMUSCULAR | Qty: 1 | Status: AC

## 2021-12-18 ENCOUNTER — Inpatient Hospital Stay: Payer: Medicare Other

## 2021-12-18 ENCOUNTER — Other Ambulatory Visit: Payer: Self-pay

## 2021-12-18 ENCOUNTER — Ambulatory Visit: Payer: Medicare Other | Admitting: Oncology

## 2021-12-18 ENCOUNTER — Ambulatory Visit: Payer: Medicare Other

## 2021-12-18 ENCOUNTER — Ambulatory Visit
Admission: RE | Admit: 2021-12-18 | Discharge: 2021-12-18 | Disposition: A | Discharge status: Home / Self Care | Payer: Medicare Other | Source: Ambulatory Visit | Attending: Radiation Oncology | Admitting: Radiation Oncology

## 2021-12-18 ENCOUNTER — Encounter: Payer: Self-pay | Admitting: *Deleted

## 2021-12-18 VITALS — BP 104/60 | HR 100 | Temp 97.2°F | Resp 17 | Wt 71.9 lb

## 2021-12-18 DIAGNOSIS — Z5111 Encounter for antineoplastic chemotherapy: Secondary | ICD-10-CM | POA: Diagnosis not present

## 2021-12-18 DIAGNOSIS — D509 Iron deficiency anemia, unspecified: Secondary | ICD-10-CM

## 2021-12-18 DIAGNOSIS — C342 Malignant neoplasm of middle lobe, bronchus or lung: Secondary | ICD-10-CM

## 2021-12-18 LAB — COMPREHENSIVE METABOLIC PANEL
ALT: 23 U/L (ref 0–44)
AST: 22 U/L (ref 15–41)
Albumin: 2.6 g/dL — ABNORMAL LOW (ref 3.5–5.0)
Alkaline Phosphatase: 56 U/L (ref 38–126)
Anion gap: 6 (ref 5–15)
BUN: 24 mg/dL — ABNORMAL HIGH (ref 8–23)
CO2: 28 mmol/L (ref 22–32)
Calcium: 7.9 mg/dL — ABNORMAL LOW (ref 8.9–10.3)
Chloride: 102 mmol/L (ref 98–111)
Creatinine, Ser: 0.55 mg/dL (ref 0.44–1.00)
GFR, Estimated: >60 mL/min (ref >60)
Glucose, Bld: 137 mg/dL — ABNORMAL HIGH (ref 70–99)
Potassium: 3.3 mmol/L — ABNORMAL LOW (ref 3.5–5.1)
Sodium: 136 mmol/L (ref 135–145)
Total Bilirubin: 0.4 mg/dL (ref 0.3–1.2)
Total Protein: 5.1 g/dL — ABNORMAL LOW (ref 6.5–8.1)

## 2021-12-18 LAB — RAD ONC ARIA SESSION SUMMARY
Course Elapsed Days: 21
Plan Fractions Treated to Date: 16
Plan Prescribed Dose Per Fraction: 2 Gy
Plan Total Fractions Prescribed: 35
Plan Total Prescribed Dose: 70 Gy
Reference Point Dosage Given to Date: 32 Gy
Reference Point Session Dosage Given: 2 Gy
Session Number: 16

## 2021-12-18 LAB — CBC WITH DIFFERENTIAL/PLATELET
Abs Immature Granulocytes: 0.06 10*3/uL (ref 0.00–0.07)
Basophils Absolute: 0 10*3/uL (ref 0.0–0.1)
Basophils Relative: 1 %
Eosinophils Absolute: 0 10*3/uL (ref 0.0–0.5)
Eosinophils Relative: 1 %
HCT: 30.1 % — ABNORMAL LOW (ref 36.0–46.0)
Hemoglobin: 9.2 g/dL — ABNORMAL LOW (ref 12.0–15.0)
Immature Granulocytes: 2 %
Lymphocytes Relative: 7 %
Lymphs Abs: 0.3 10*3/uL — ABNORMAL LOW (ref 0.7–4.0)
MCH: 25 pg — ABNORMAL LOW (ref 26.0–34.0)
MCHC: 30.6 g/dL (ref 30.0–36.0)
MCV: 81.8 fL (ref 80.0–100.0)
Monocytes Absolute: 0.4 10*3/uL (ref 0.1–1.0)
Monocytes Relative: 10 %
Neutro Abs: 3.3 10*3/uL (ref 1.7–7.7)
Neutrophils Relative %: 79 %
Platelets: 318 10*3/uL (ref 150–400)
RBC: 3.68 MIL/uL — ABNORMAL LOW (ref 3.87–5.11)
RDW: 19.7 % — ABNORMAL HIGH (ref 11.5–15.5)
WBC: 4 10*3/uL (ref 4.0–10.5)
nRBC: 0 % (ref 0.0–0.2)

## 2021-12-18 MED ORDER — SODIUM CHLORIDE 0.9% FLUSH
10.0000 mL | INTRAVENOUS | Status: DC | PRN
  Administered 2021-12-18: 10 mL via INTRAVENOUS
  Filled 2021-12-18: qty 10

## 2021-12-18 MED ORDER — SODIUM CHLORIDE 0.9 % IV SOLN
510.0000 mg | INTRAVENOUS | Status: DC
  Filled 2021-12-18: qty 17

## 2021-12-18 MED ORDER — FAMOTIDINE IN NACL 20-0.9 MG/50ML-% IV SOLN
20.0000 mg | Freq: Once | INTRAVENOUS | Status: AC
  Administered 2021-12-18: 20 mg via INTRAVENOUS
  Filled 2021-12-18: qty 50

## 2021-12-18 MED ORDER — PALONOSETRON HCL INJECTION 0.25 MG/5ML
0.2500 mg | Freq: Once | INTRAVENOUS | Status: AC
  Administered 2021-12-18: 0.25 mg via INTRAVENOUS
  Filled 2021-12-18: qty 5

## 2021-12-18 MED ORDER — DIPHENHYDRAMINE HCL 50 MG/ML IJ SOLN
50.0000 mg | Freq: Once | INTRAMUSCULAR | Status: AC
  Administered 2021-12-18: 50 mg via INTRAVENOUS
  Filled 2021-12-18: qty 1

## 2021-12-18 MED ORDER — SODIUM CHLORIDE 0.9 % IV SOLN
Freq: Once | INTRAVENOUS | Status: AC
  Filled 2021-12-18: qty 250

## 2021-12-18 MED ORDER — SODIUM CHLORIDE 0.9 % IV SOLN
103.2000 mg | Freq: Once | INTRAVENOUS | Status: AC
  Administered 2021-12-18: 100 mg via INTRAVENOUS
  Filled 2021-12-18: qty 10

## 2021-12-18 MED ORDER — SODIUM CHLORIDE 0.9 % IV SOLN
510.0000 mg | Freq: Once | INTRAVENOUS | Status: AC
  Administered 2021-12-18: 510 mg via INTRAVENOUS
  Filled 2021-12-18: qty 17

## 2021-12-18 MED ORDER — SODIUM CHLORIDE 0.9 % IV SOLN
45.0000 mg/m2 | Freq: Once | INTRAVENOUS | Status: AC
  Administered 2021-12-18: 54 mg via INTRAVENOUS
  Filled 2021-12-18: qty 9

## 2021-12-18 MED ORDER — SODIUM CHLORIDE 0.9 % IV SOLN: 109.2000 mg | Freq: Once | INTRAVENOUS | Status: DC

## 2021-12-18 MED ORDER — SODIUM CHLORIDE 0.9 % IV SOLN
10.0000 mg | Freq: Once | INTRAVENOUS | Status: AC
  Administered 2021-12-18: 10 mg via INTRAVENOUS
  Filled 2021-12-18: qty 10

## 2021-12-18 MED ORDER — HEPARIN SOD (PORK) LOCK FLUSH 100 UNIT/ML IV SOLN
500.0000 [IU] | Freq: Once | INTRAVENOUS | Status: AC | PRN
  Administered 2021-12-18: 500 [IU]
  Filled 2021-12-18: qty 5

## 2021-12-18 NOTE — Patient Instructions (Signed)
[facility] CANCER CTR AT [place]  Discharge Instructions: Thank you for choosing [facility] to provide your oncology and hematology care.  If you have a lab appointment with the [facility], please go directly to the [facility] and check in at the registration area.  Wear comfortable clothing and clothing appropriate for easy access to any Portacath or PICC line.   We strive to give you quality time with your provider. You may need to reschedule your appointment if you arrive late (15 or more minutes).  Arriving late affects you and other patients whose appointments are after yours.  Also, if you miss three or more appointments without notifying the office, you may be dismissed from the clinic at the provider's discretion.      For prescription refill requests, have your pharmacy contact our office and allow 72 hours for refills to be completed.    Today you received the following chemotherapy and/or immunotherapy agents Taxol and Carboplatin       To help prevent nausea and vomiting after your treatment, we encourage you to take your nausea medication as directed.  BELOW ARE SYMPTOMS THAT SHOULD BE REPORTED IMMEDIATELY: *FEVER GREATER THAN 100.4 F (38 C) OR HIGHER *CHILLS OR SWEATING *NAUSEA AND VOMITING THAT IS NOT CONTROLLED WITH YOUR NAUSEA MEDICATION *UNUSUAL SHORTNESS OF BREATH *UNUSUAL BRUISING OR BLEEDING *URINARY PROBLEMS (pain or burning when urinating, or frequent urination) *BOWEL PROBLEMS (unusual diarrhea, constipation, pain near the anus) TENDERNESS IN MOUTH AND THROAT WITH OR WITHOUT PRESENCE OF ULCERS (sore throat, sores in mouth, or a toothache) UNUSUAL RASH, SWELLING OR PAIN  UNUSUAL VAGINAL DISCHARGE OR ITCHING   Items with * indicate a potential emergency and should be followed up as soon as possible or go to the Emergency Department if any problems should occur.  Please show the CHEMOTHERAPY ALERT CARD or IMMUNOTHERAPY ALERT CARD at  check-in to the Emergency Department and triage nurse.  Should you have questions after your visit or need to cancel or reschedule your appointment, please contact [facility] CANCER [place] AT [place]  [phone] and follow the prompts.  Office hours are 8:00 a.m. to 4:30 p.m. Monday - Friday. Please note that voicemails left after 4:00 p.m. may not be returned until the following business day.  We are closed weekends and major holidays. You have access to a nurse at all times for urgent questions. Please call the main number to the clinic [phone] and follow the prompts.  For any non-urgent questions, you may also contact your provider using MyChart. We now offer e-Visits for anyone 47 and older to request care online for non-urgent symptoms. For details visit [email or web address].   Also download the MyChart app! Go to the app store, search "MyChart", open the app, select [facility], and log in with your MyChart username and password.  Masks are optional in the cancer centers. If you would like for your care team to wear a mask while they are taking care of you, please let them know. For doctor visits, patients may have with them one support person who is at least 75 years old. At this time, visitors are not allowed in the infusion area.

## 2021-12-19 ENCOUNTER — Inpatient Hospital Stay: Payer: Medicare Other

## 2021-12-19 ENCOUNTER — Other Ambulatory Visit: Payer: Self-pay

## 2021-12-19 ENCOUNTER — Ambulatory Visit
Admission: RE | Admit: 2021-12-19 | Discharge: 2021-12-19 | Disposition: A | Discharge status: Home / Self Care | Payer: Medicare Other | Source: Ambulatory Visit | Attending: Radiation Oncology | Admitting: Radiation Oncology

## 2021-12-19 DIAGNOSIS — Z5111 Encounter for antineoplastic chemotherapy: Secondary | ICD-10-CM | POA: Diagnosis not present

## 2021-12-19 LAB — RAD ONC ARIA SESSION SUMMARY
Course Elapsed Days: 22
Plan Fractions Treated to Date: 17
Plan Prescribed Dose Per Fraction: 2 Gy
Plan Total Fractions Prescribed: 35
Plan Total Prescribed Dose: 70 Gy
Reference Point Dosage Given to Date: 34 Gy
Reference Point Session Dosage Given: 2 Gy
Session Number: 17

## 2021-12-20 ENCOUNTER — Ambulatory Visit
Admission: RE | Admit: 2021-12-20 | Discharge: 2021-12-20 | Disposition: A | Discharge status: Home / Self Care | Payer: Medicare Other | Source: Ambulatory Visit | Attending: Radiation Oncology | Admitting: Radiation Oncology

## 2021-12-20 ENCOUNTER — Other Ambulatory Visit: Payer: Self-pay

## 2021-12-20 ENCOUNTER — Inpatient Hospital Stay: Payer: Medicare Other

## 2021-12-20 DIAGNOSIS — Z5111 Encounter for antineoplastic chemotherapy: Secondary | ICD-10-CM | POA: Diagnosis not present

## 2021-12-20 LAB — RAD ONC ARIA SESSION SUMMARY
Course Elapsed Days: 23
Plan Fractions Treated to Date: 18
Plan Prescribed Dose Per Fraction: 2 Gy
Plan Total Fractions Prescribed: 35
Plan Total Prescribed Dose: 70 Gy
Reference Point Dosage Given to Date: 36 Gy
Reference Point Session Dosage Given: 2 Gy
Session Number: 18

## 2021-12-20 MED FILL — Dexamethasone Sodium Phosphate Inj 100 MG/10ML: INTRAMUSCULAR | Qty: 1 | Status: AC

## 2021-12-25 ENCOUNTER — Ambulatory Visit: Payer: Medicare Other

## 2021-12-25 ENCOUNTER — Encounter: Payer: Self-pay | Admitting: *Deleted

## 2021-12-25 ENCOUNTER — Inpatient Hospital Stay: Payer: Medicare Other

## 2021-12-25 ENCOUNTER — Inpatient Hospital Stay (HOSPITAL_BASED_OUTPATIENT_CLINIC_OR_DEPARTMENT_OTHER): Payer: Medicare Other | Admitting: Oncology

## 2021-12-25 ENCOUNTER — Ambulatory Visit
Admission: RE | Admit: 2021-12-25 | Discharge: 2021-12-25 | Disposition: A | Discharge status: Home / Self Care | Payer: Medicare Other | Source: Ambulatory Visit | Attending: Radiation Oncology | Admitting: Radiation Oncology

## 2021-12-25 ENCOUNTER — Other Ambulatory Visit: Payer: Self-pay

## 2021-12-25 ENCOUNTER — Encounter: Payer: Self-pay | Admitting: Oncology

## 2021-12-25 VITALS — BP 114/75 | HR 103 | Temp 97.3°F | Resp 14 | Wt 72.1 lb

## 2021-12-25 VITALS — HR 102

## 2021-12-25 DIAGNOSIS — C342 Malignant neoplasm of middle lobe, bronchus or lung: Secondary | ICD-10-CM

## 2021-12-25 DIAGNOSIS — D509 Iron deficiency anemia, unspecified: Secondary | ICD-10-CM

## 2021-12-25 DIAGNOSIS — Z5111 Encounter for antineoplastic chemotherapy: Secondary | ICD-10-CM | POA: Diagnosis not present

## 2021-12-25 DIAGNOSIS — G893 Neoplasm related pain (acute) (chronic): Secondary | ICD-10-CM

## 2021-12-25 LAB — CBC WITH DIFFERENTIAL/PLATELET
Abs Immature Granulocytes: 0.06 10*3/uL (ref 0.00–0.07)
Basophils Absolute: 0 10*3/uL (ref 0.0–0.1)
Basophils Relative: 1 %
Eosinophils Absolute: 0 10*3/uL (ref 0.0–0.5)
Eosinophils Relative: 0 %
HCT: 29.3 % — ABNORMAL LOW (ref 36.0–46.0)
Hemoglobin: 9.3 g/dL — ABNORMAL LOW (ref 12.0–15.0)
Immature Granulocytes: 2 %
Lymphocytes Relative: 8 %
Lymphs Abs: 0.3 10*3/uL — ABNORMAL LOW (ref 0.7–4.0)
MCH: 26.7 pg (ref 26.0–34.0)
MCHC: 31.7 g/dL (ref 30.0–36.0)
MCV: 84.2 fL (ref 80.0–100.0)
Monocytes Absolute: 0.4 10*3/uL (ref 0.1–1.0)
Monocytes Relative: 13 %
Neutro Abs: 2.5 10*3/uL (ref 1.7–7.7)
Neutrophils Relative %: 76 %
Platelets: 222 10*3/uL (ref 150–400)
RBC: 3.48 MIL/uL — ABNORMAL LOW (ref 3.87–5.11)
RDW: 23.5 % — ABNORMAL HIGH (ref 11.5–15.5)
WBC: 3.3 10*3/uL — ABNORMAL LOW (ref 4.0–10.5)
nRBC: 0 % (ref 0.0–0.2)

## 2021-12-25 LAB — RAD ONC ARIA SESSION SUMMARY
Course Elapsed Days: 28
Plan Fractions Treated to Date: 19
Plan Prescribed Dose Per Fraction: 2 Gy
Plan Total Fractions Prescribed: 35
Plan Total Prescribed Dose: 70 Gy
Reference Point Dosage Given to Date: 38 Gy
Reference Point Session Dosage Given: 2 Gy
Session Number: 19

## 2021-12-25 LAB — COMPREHENSIVE METABOLIC PANEL
ALT: 26 U/L (ref 0–44)
AST: 24 U/L (ref 15–41)
Albumin: 2.9 g/dL — ABNORMAL LOW (ref 3.5–5.0)
Alkaline Phosphatase: 61 U/L (ref 38–126)
Anion gap: 5 (ref 5–15)
BUN: 24 mg/dL — ABNORMAL HIGH (ref 8–23)
CO2: 26 mmol/L (ref 22–32)
Calcium: 7.7 mg/dL — ABNORMAL LOW (ref 8.9–10.3)
Chloride: 104 mmol/L (ref 98–111)
Creatinine, Ser: 0.47 mg/dL (ref 0.44–1.00)
GFR, Estimated: >60 mL/min (ref >60)
Glucose, Bld: 142 mg/dL — ABNORMAL HIGH (ref 70–99)
Potassium: 3.6 mmol/L (ref 3.5–5.1)
Sodium: 135 mmol/L (ref 135–145)
Total Bilirubin: 0.4 mg/dL (ref 0.3–1.2)
Total Protein: 5.4 g/dL — ABNORMAL LOW (ref 6.5–8.1)

## 2021-12-25 MED ORDER — FAMOTIDINE IN NACL 20-0.9 MG/50ML-% IV SOLN
20.0000 mg | Freq: Once | INTRAVENOUS | Status: AC
  Administered 2021-12-25: 20 mg via INTRAVENOUS
  Filled 2021-12-25: qty 50

## 2021-12-25 MED ORDER — SODIUM CHLORIDE 0.9 % IV SOLN
45.0000 mg/m2 | Freq: Once | INTRAVENOUS | Status: AC
  Administered 2021-12-25: 54 mg via INTRAVENOUS
  Filled 2021-12-25: qty 9

## 2021-12-25 MED ORDER — SODIUM CHLORIDE 0.9 % IV SOLN
Freq: Once | INTRAVENOUS | Status: AC
  Filled 2021-12-25: qty 250

## 2021-12-25 MED ORDER — HEPARIN SOD (PORK) LOCK FLUSH 100 UNIT/ML IV SOLN
500.0000 [IU] | Freq: Once | INTRAVENOUS | Status: AC | PRN
  Administered 2021-12-25: 500 [IU]
  Filled 2021-12-25: qty 5

## 2021-12-25 MED ORDER — PALONOSETRON HCL INJECTION 0.25 MG/5ML
0.2500 mg | Freq: Once | INTRAVENOUS | Status: AC
  Administered 2021-12-25: 0.25 mg via INTRAVENOUS
  Filled 2021-12-25: qty 5

## 2021-12-25 MED ORDER — DIPHENHYDRAMINE HCL 50 MG/ML IJ SOLN
50.0000 mg | Freq: Once | INTRAMUSCULAR | Status: AC
  Administered 2021-12-25: 50 mg via INTRAVENOUS
  Filled 2021-12-25: qty 1

## 2021-12-25 MED ORDER — SODIUM CHLORIDE 0.9 % IV SOLN
10.0000 mg | Freq: Once | INTRAVENOUS | Status: AC
  Administered 2021-12-25: 10 mg via INTRAVENOUS
  Filled 2021-12-25: qty 10

## 2021-12-25 MED ORDER — SODIUM CHLORIDE 0.9 % IV SOLN
510.0000 mg | INTRAVENOUS | Status: DC
  Administered 2021-12-25: 510 mg via INTRAVENOUS
  Filled 2021-12-25: qty 17

## 2021-12-25 MED ORDER — SODIUM CHLORIDE 0.9 % IV SOLN
100.0000 mg | Freq: Once | INTRAVENOUS | Status: AC
  Administered 2021-12-25: 100 mg via INTRAVENOUS
  Filled 2021-12-25: qty 10

## 2021-12-25 NOTE — Progress Notes (Signed)
Pt states her urine has a small odor but no UTI symptoms are present. Will like to discuss if odor comes from chemo noticed the difference about a week ago.

## 2021-12-25 NOTE — Progress Notes (Signed)
Hematology/Oncology Consult note [facility]  Telephone:([phone] Fax:([phone]) [phone]  Patient Care Team: Albina Billet, MD as PCP - General (Internal Medicine) Marden Noble, MD (Internal Medicine) Bary Castilla, Forest Gleason, MD (General Surgery) Telford Nab, RN as Oncology Nurse Navigator Noreene Filbert, MD as Referring Physician (Radiation Oncology) Sindy Guadeloupe, MD as Consulting Physician (Oncology)   Name of the patient: Selene Peltzer  [id number]  [birth year]   Date of visit: 12/25/21  Diagnosis- non-small cell lung cancer involving the right middle lobe stage IV T1b N2 M1     Chief complaint/ Reason for visit-on treatment assessment prior to cycle 5 of weekly CarboTaxol chemotherapy  Heme/Onc history: Patient is a 76 year old female with a history oflung cancer in 2012 s/p right upper lobe resection.  Low dose chest CT on 09/04/2018 revealed multiple small pulmonary nodules are noted in the lungs bilaterally. In addition, there was a 1.53 cm concerning nodule in the right lower lobe. There was diffuse bronchial wall thickening with mild centrilobular and paraseptal emphysema; imaging findings suggestive of underlying COPD.PET scan on 09/16/2018 revealed low level FDG uptake associated with the right lower lobe lung nodule (SUV 1.57). This was a nonspecific finding. Given the size of this nodule and the low level uptake, findings may reflect inflammatory or infectious nodule. Malignancy was not entirely excluded.  Low dose chest CT on 12/04/2018 revealed a 15.3 mm RLL nodule unchanged in size.   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  There were some ground-glass attenuation components, but is predominantly solid in appearance with some internal air bronchograms, with macrolobulated and spiculated borders, clearly  increased in size.   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  Tumor was positive for CK7 and TTF-1 and negative for p40.  She is not a surgical candidate.   She received 6000 cGy SBRT to the RLL from 08/05/2019 - 08/17/2019.   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  History of iron deficiency anemia and she has undergone work-up in the past.  She has a history of gastric bypass surgery.    Patient hadCT scan of breast 2023 which showed narrowing and occlusion of the right middle lobe bronchus which was looking more prominent as compared to prior scans.  There was also concern for paratracheal adenopathy which had increased in size as compared to prior scans.  Patient was seen by pulmonary and underwent bronchoscopy guided biopsy.  Bronchial biopsies of the right middle lobe did not show any malignancy however cytology from station 7 and station 4R lymph node was consistent with adenocarcinoma.    Interval history-tolerating treatments well so far.  Chest wall pain is improved and she is only taking oxycodone once a day at this time.  Appetite is improving.  Denies any significant pain on swallowing  ECOG PS- 1 Pain scale- 2 Opioid associated constipation- no  Review of systems- Review of Systems  Constitutional:  Positive for malaise/fatigue. Negative for chills, fever and weight loss.  HENT:  Negative for congestion, ear discharge and nosebleeds.   Eyes:  Negative for blurred vision.  Respiratory:  Negative for cough, hemoptysis, sputum production, shortness of breath and wheezing.   Cardiovascular:  Negative for chest pain, palpitations, orthopnea and claudication.  Gastrointestinal:  Negative for abdominal pain, blood in  stool, constipation, diarrhea, heartburn, melena, nausea and vomiting.  Genitourinary:  Negative for dysuria, flank pain, frequency, hematuria and urgency.  Musculoskeletal:   Negative for back pain, joint pain and myalgias.  Skin:  Negative for rash.  Neurological:  Negative for dizziness, tingling, focal weakness, seizures, weakness and headaches.  Endo/Heme/Allergies:  Does not bruise/bleed easily.  Psychiatric/Behavioral:  Negative for depression and suicidal ideas. The patient does not have insomnia.       Allergies  Allergen Reactions   Hydromorphone Hcl Itching   Bentyl [Dicyclomine] Nausea And Vomiting   Biaxin [Clarithromycin] Other (See Comments)    hallucinations   Budesonide Other (See Comments)    Ulcer   Erythromycin Nausea And Vomiting   Reglan [Metoclopramide] Other (See Comments)    tremors     Past Medical History:  Diagnosis Date   Arthritis    hands   Avascular necrosis of hip, right ([place])    Blepharospasm    Cancer ([place]) 2011   Right Upper Lobe Lobectomy   Chronic diarrhea    Chronic diarrhea    Collagenous colitis    Complication of anesthesia    usually wakes up during procedures  (endoscopy and colonoscopy)   COPD (chronic obstructive pulmonary disease) ([place])    COVID 2022   Depression    Diverticulosis    Gastric outlet obstruction    Headache    every couple of days   Hemorrhoid    History of Crohn's disease    Hypoglycemic disorder    IDA (iron deficiency anemia)    Intestinal adhesions    Multiple gastric ulcers    Multiple gastric ulcers    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[place])    Stroke ([place]) 8 yrs ago   double vision left eye     Past Surgical History:  Procedure Laterality Date   APPENDECTOMY  1989   botox injections for blepharospasm     BREAST SURGERY     CATARACT EXTRACTION     COLON RESECTION  2005   COLONOSCOPY  10-29-2008   [name]   COLONOSCOPY WITH PROPOFOL N/A 03/05/2017   Procedure: COLONOSCOPY WITH PROPOFOL;  Surgeon: Toledo, Benay Pike, MD;  Location: [facility] ENDOSCOPY;  Service: Gastroenterology;  Laterality: N/A;   COLOSTOMY REVERSAL  2006   ESOPHAGOGASTRODUODENOSCOPY (EGD)  WITH PROPOFOL N/A 07/18/2016   Procedure: ESOPHAGOGASTRODUODENOSCOPY (EGD) WITH PROPOFOL;  Surgeon: Robert Bellow, MD;  Location: [facility] ENDOSCOPY;  Service: Endoscopy;  Laterality: N/A;   ESOPHAGOGASTRODUODENOSCOPY (EGD) WITH PROPOFOL N/A 08/03/2016   Procedure: ESOPHAGOGASTRODUODENOSCOPY (EGD) WITH PROPOFOL;  Surgeon: Robert Bellow, MD;  Location: [facility] ENDOSCOPY;  Service: Endoscopy;  Laterality: N/A;   ESOPHAGOGASTRODUODENOSCOPY (EGD) WITH PROPOFOL N/A 09/01/2018   Procedure: ESOPHAGOGASTRODUODENOSCOPY (EGD) WITH PROPOFOL;  Surgeon: Toledo, Benay Pike, MD;  Location: [facility] ENDOSCOPY;  Service: Gastroenterology;  Laterality: N/A;   ESOPHAGOGASTRODUODENOSCOPY (EGD) WITH PROPOFOL N/A 12/01/2018   Procedure: ESOPHAGOGASTRODUODENOSCOPY (EGD) WITH PROPOFOL;  Surgeon: Toledo, Benay Pike, MD;  Location: [facility] ENDOSCOPY;  Service: Gastroenterology;  Laterality: N/A;   ESOPHAGOGASTRODUODENOSCOPY (EGD) WITH PROPOFOL N/A 02/23/2019   Procedure: ESOPHAGOGASTRODUODENOSCOPY (EGD) WITH PROPOFOL;  Surgeon: Lucilla Lame, MD;  Location: [facility];  Service: Endoscopy;  Laterality: N/A;   ESOPHAGOSCOPY WITH DILITATION  2012,2015   [facility] [place]   EYE SURGERY     FLEXIBLE BRONCHOSCOPY N/A 10/30/2021   Procedure: FLEXIBLE BRONCHOSCOPY;  Surgeon: Armando Reichert, MD;  Location: [facility] ORS;  Service: Pulmonary;  Laterality: N/A;   GASTRECTOMY     gastric ulcer  1989,  1991   IR IMAGING GUIDED PORT INSERTION  11/14/2021   JOINT REPLACEMENT     right total hip arthroplasty 03/03/02   LAPAROSCOPIC LYSIS OF ADHESIONS     LUNG CANCER SURGERY  2011   LYSIS OF ADHESION     PLACEMENT OF BREAST IMPLANTS  1973   thoracotomy with right upper lobectomy and central compartment node dissection     UPPER GASTROINTESTINAL ENDOSCOPY  10-29-2008   [name]   VIDEO BRONCHOSCOPY WITH ENDOBRONCHIAL ULTRASOUND N/A 10/30/2021   Procedure: VIDEO BRONCHOSCOPY WITH ENDOBRONCHIAL ULTRASOUND;  Surgeon: Armando Reichert, MD;  Location:  [facility] ORS;  Service: Pulmonary;  Laterality: N/A;    Social History   Socioeconomic History   Marital status: Widowed    Spouse name: Not on file   Number of children: 2   Years of education: Not on file   Highest education level: Not on file  Occupational History   Occupation: retired    Comment: Pharmacist, hospital  Tobacco Use   Smoking status: Former    Packs/day: 1.00    Years: 25.00    Total pack years: 25.00    Types: Cigarettes    Quit date: 01/30/2008    Years since quitting: 13.9   Smokeless tobacco: Never  Vaping Use   Vaping Use: Never used  Substance and Sexual Activity   Alcohol use: Yes    Comment: occ. for holiday   Drug use: No   Sexual activity: Not Currently  Other Topics Concern   Not on file  Social History Narrative   Lives alone    Social Determinants of Health   Financial Resource Strain: Not on file  Food Insecurity: Not on file  Transportation Needs: Unmet Transportation Needs (12/25/2021)   PRAPARE - Hydrologist (Medical): Yes    Lack of Transportation (Non-Medical): Yes  Physical Activity: Not on file  Stress: Not on file  Social Connections: Not on file  Intimate Partner Violence: Not on file    No family history on file.   Current Outpatient Medications:    acetaminophen (TYLENOL) 500 MG tablet, Take 500 mg by mouth every 6 (six) hours as needed., Disp: , Rfl:    brimonidine-timolol (COMBIGAN) 0.2-0.5 % ophthalmic solution, INT 1 GTT IN OU BID, Disp: , Rfl:    Cholecalciferol (VITAMIN D3) 1000 units CAPS, Take 1 capsule by mouth daily., Disp: , Rfl:    dexamethasone (DECADRON) 4 MG tablet, Take 2 tablets daily for 2 days, start the day after chemotherapy. Take with food., Disp: 30 tablet, Rfl: 1   docusate (COLACE) 50 MG/5ML liquid, Take by mouth daily., Disp: , Rfl:    fluticasone (FLONASE) 50 MCG/ACT nasal spray, Place 2 sprays into both nostrils daily., Disp: , Rfl:    lidocaine-prilocaine (EMLA) cream, Apply to  affected area once, Disp: 30 g, Rfl: 3   loratadine (CLARITIN) 10 MG tablet, Take 10 mg by mouth daily., Disp: , Rfl:    Multiple Vitamin (MULTI-VITAMINS) TABS, Take 1 tablet by mouth daily. once daily., Disp: , Rfl:    NON FORMULARY, Take 1 Dose by mouth daily. BONE BROTH, Disp: , Rfl:    ondansetron (ZOFRAN) 8 MG tablet, Take 1 tablet (8 mg total) by mouth every 8 (eight) hours as needed for nausea or vomiting. Start on the third day after chemotherapy., Disp: 30 tablet, Rfl: 1   oxyCODONE (OXY IR/ROXICODONE) 5 MG immediate release tablet, Take 2 tablets (10 mg total) by mouth 3 (three) times  daily as needed for severe pain., Disp: 180 tablet, Rfl: 0   pantoprazole (PROTONIX) 40 MG tablet, Take 40 mg by mouth 2 (two) times daily. , Disp: , Rfl:    sucralfate (CARAFATE) 1 G tablet, Take 1 g by mouth 4 (four) times daily. , Disp: , Rfl:    traZODone (DESYREL) 150 MG tablet, Take 300 mg by mouth at bedtime. , Disp: , Rfl:    venlafaxine XR (EFFEXOR-XR) 150 MG 24 hr capsule, Take 150 mg by mouth daily with breakfast. , Disp: , Rfl:    prochlorperazine (COMPAZINE) 10 MG tablet, Take 1 tablet (10 mg total) by mouth every 6 (six) hours as needed for nausea or vomiting. (Patient not taking: Reported on 11/27/2021), Disp: 30 tablet, Rfl: 1 No current facility-administered medications for this visit.  Facility-Administered Medications Ordered in Other Visits:    ferumoxytol (FERAHEME) 510 mg in sodium chloride 0.9 % 100 mL IVPB, 510 mg, Intravenous, Weekly, Sindy Guadeloupe, MD, Stopped at 12/25/21 1132   heparin lock flush 100 UNIT/ML injection, , , ,    sodium chloride flush (NS) 0.9 % injection 10 mL, 10 mL, Intracatheter, PRN, Sindy Guadeloupe, MD  Physical exam:  Vitals:   12/25/21 0900  BP: 114/75  Pulse: (!) 103  Resp: 14  Temp: (!) 97.3 F (36.3 C)  SpO2: 95%  Weight: 72 lb 1.6 oz (32.7 kg)   Physical Exam Cardiovascular:     Rate and Rhythm: Normal rate and regular rhythm.     Heart  sounds: Normal heart sounds.  Pulmonary:     Effort: Pulmonary effort is normal.     Breath sounds: Normal breath sounds.  Abdominal:     General: Bowel sounds are normal.     Palpations: Abdomen is soft.  Skin:    General: Skin is warm and dry.  Neurological:     Mental Status: She is alert and oriented to person, place, and time.         Latest Ref Rng & Units 12/25/2021    8:44 AM  CMP  Glucose 70 - 99 mg/dL 142   BUN 8 - 23 mg/dL 24   Creatinine 0.44 - 1.00 mg/dL 0.47   Sodium 135 - 145 mmol/L 135   Potassium 3.5 - 5.1 mmol/L 3.6   Chloride 98 - 111 mmol/L 104   CO2 22 - 32 mmol/L 26   Calcium 8.9 - 10.3 mg/dL 7.7   Total Protein 6.5 - 8.1 g/dL 5.4   Total Bilirubin 0.3 - 1.2 mg/dL 0.4   Alkaline Phos 38 - 126 U/L 61   AST 15 - 41 U/L 24   ALT 0 - 44 U/L 26       Latest Ref Rng & Units 12/25/2021    8:44 AM  CBC  WBC 4.0 - 10.5 K/uL 3.3   Hemoglobin 12.0 - 15.0 g/dL 9.3   Hematocrit 36.0 - 46.0 % 29.3   Platelets 150 - 400 K/uL 222      Assessment and plan- Patient is a 76 y.o. female with history of non-small cell lung cancer involving the right middle lobe stage IV T1b N2 M1.  She is here for on treatment assessment prior to cycle 5 of weekly CarboTaxol chemotherapy  Counts okay to proceed with cycle 5 of weekly CarboTaxol chemotherapy today.She will directly proceed for cycle 6 next week and I will see her back in 2 weeks for cycle 7 which would be her last treatment.  White  cell count is mildly low at 3.3 today with an ANC of 2.5.  She may require Neupogen with next cycle.  Plan to repeat scans after 7 cycles of carbo Taxol along with radiation.  Neoplasm related pain: Continue as needed oxycodone   Visit Diagnosis 1. Non-small cell cancer of middle lobe of right lung ([place])   2. Encounter for antineoplastic chemotherapy   3. Neoplasm related pain      Dr. Randa Evens, MD, MPH [facility] at [facility] [phone] 12/25/2021 4:10  PM

## 2021-12-26 ENCOUNTER — Encounter: Payer: Self-pay | Admitting: *Deleted

## 2021-12-26 ENCOUNTER — Ambulatory Visit
Admission: RE | Admit: 2021-12-26 | Discharge: 2021-12-26 | Disposition: A | Discharge status: Home / Self Care | Payer: Medicare Other | Source: Ambulatory Visit | Attending: Radiation Oncology | Admitting: Radiation Oncology

## 2021-12-26 ENCOUNTER — Other Ambulatory Visit: Payer: Self-pay

## 2021-12-26 ENCOUNTER — Inpatient Hospital Stay: Payer: Medicare Other

## 2021-12-26 DIAGNOSIS — Z5111 Encounter for antineoplastic chemotherapy: Secondary | ICD-10-CM | POA: Diagnosis not present

## 2021-12-26 LAB — RAD ONC ARIA SESSION SUMMARY
Course Elapsed Days: 29
Plan Fractions Treated to Date: 20
Plan Prescribed Dose Per Fraction: 2 Gy
Plan Total Fractions Prescribed: 35
Plan Total Prescribed Dose: 70 Gy
Reference Point Dosage Given to Date: 40 Gy
Reference Point Session Dosage Given: 2 Gy
Session Number: 20

## 2021-12-27 ENCOUNTER — Other Ambulatory Visit: Payer: Self-pay

## 2021-12-27 ENCOUNTER — Encounter: Payer: Self-pay | Admitting: *Deleted

## 2021-12-27 ENCOUNTER — Inpatient Hospital Stay: Payer: Medicare Other

## 2021-12-27 ENCOUNTER — Ambulatory Visit
Admission: RE | Admit: 2021-12-27 | Discharge: 2021-12-27 | Disposition: A | Discharge status: Home / Self Care | Payer: Medicare Other | Source: Ambulatory Visit | Attending: Radiation Oncology | Admitting: Radiation Oncology

## 2021-12-27 DIAGNOSIS — Z5111 Encounter for antineoplastic chemotherapy: Secondary | ICD-10-CM | POA: Diagnosis not present

## 2021-12-27 LAB — RAD ONC ARIA SESSION SUMMARY
Course Elapsed Days: 30
Plan Fractions Treated to Date: 21
Plan Prescribed Dose Per Fraction: 2 Gy
Plan Total Fractions Prescribed: 35
Plan Total Prescribed Dose: 70 Gy
Reference Point Dosage Given to Date: 42 Gy
Reference Point Session Dosage Given: 2 Gy
Session Number: 21

## 2021-12-28 ENCOUNTER — Inpatient Hospital Stay: Payer: Medicare Other

## 2021-12-28 ENCOUNTER — Ambulatory Visit
Admission: RE | Admit: 2021-12-28 | Discharge: 2021-12-28 | Disposition: A | Discharge status: Home / Self Care | Payer: Medicare Other | Source: Ambulatory Visit | Attending: Radiation Oncology | Admitting: Radiation Oncology

## 2021-12-28 ENCOUNTER — Encounter: Payer: Self-pay | Admitting: *Deleted

## 2021-12-28 ENCOUNTER — Other Ambulatory Visit: Payer: Self-pay

## 2021-12-28 DIAGNOSIS — Z5111 Encounter for antineoplastic chemotherapy: Secondary | ICD-10-CM | POA: Diagnosis not present

## 2021-12-28 LAB — RAD ONC ARIA SESSION SUMMARY
Course Elapsed Days: 31
Plan Fractions Treated to Date: 22
Plan Prescribed Dose Per Fraction: 2 Gy
Plan Total Fractions Prescribed: 35
Plan Total Prescribed Dose: 70 Gy
Reference Point Dosage Given to Date: 44 Gy
Reference Point Session Dosage Given: 2 Gy
Session Number: 22

## 2021-12-29 ENCOUNTER — Ambulatory Visit
Admission: RE | Admit: 2021-12-29 | Discharge: 2021-12-29 | Disposition: A | Discharge status: Home / Self Care | Payer: Medicare Other | Source: Ambulatory Visit | Attending: Radiation Oncology | Admitting: Radiation Oncology

## 2021-12-29 ENCOUNTER — Other Ambulatory Visit: Payer: Self-pay

## 2021-12-29 ENCOUNTER — Inpatient Hospital Stay: Payer: Medicare Other | Attending: Oncology

## 2021-12-29 ENCOUNTER — Encounter: Payer: Self-pay | Admitting: *Deleted

## 2021-12-29 DIAGNOSIS — Z87891 Personal history of nicotine dependence: Secondary | ICD-10-CM | POA: Insufficient documentation

## 2021-12-29 DIAGNOSIS — Z9884 Bariatric surgery status: Secondary | ICD-10-CM | POA: Insufficient documentation

## 2021-12-29 DIAGNOSIS — C342 Malignant neoplasm of middle lobe, bronchus or lung: Secondary | ICD-10-CM | POA: Insufficient documentation

## 2021-12-29 DIAGNOSIS — C3431 Malignant neoplasm of lower lobe, right bronchus or lung: Secondary | ICD-10-CM | POA: Insufficient documentation

## 2021-12-29 DIAGNOSIS — Z79899 Other long term (current) drug therapy: Secondary | ICD-10-CM | POA: Insufficient documentation

## 2021-12-29 DIAGNOSIS — G893 Neoplasm related pain (acute) (chronic): Secondary | ICD-10-CM | POA: Diagnosis not present

## 2021-12-29 DIAGNOSIS — E860 Dehydration: Secondary | ICD-10-CM | POA: Diagnosis not present

## 2021-12-29 DIAGNOSIS — Z902 Acquired absence of lung [part of]: Secondary | ICD-10-CM | POA: Insufficient documentation

## 2021-12-29 DIAGNOSIS — D801 Nonfamilial hypogammaglobulinemia: Secondary | ICD-10-CM | POA: Insufficient documentation

## 2021-12-29 DIAGNOSIS — T451X5A Adverse effect of antineoplastic and immunosuppressive drugs, initial encounter: Secondary | ICD-10-CM | POA: Diagnosis not present

## 2021-12-29 DIAGNOSIS — Z510 Encounter for antineoplastic radiation therapy: Secondary | ICD-10-CM | POA: Insufficient documentation

## 2021-12-29 DIAGNOSIS — Z5111 Encounter for antineoplastic chemotherapy: Secondary | ICD-10-CM | POA: Insufficient documentation

## 2021-12-29 DIAGNOSIS — C771 Secondary and unspecified malignant neoplasm of intrathoracic lymph nodes: Secondary | ICD-10-CM | POA: Insufficient documentation

## 2021-12-29 DIAGNOSIS — D701 Agranulocytosis secondary to cancer chemotherapy: Secondary | ICD-10-CM | POA: Insufficient documentation

## 2021-12-29 DIAGNOSIS — D6481 Anemia due to antineoplastic chemotherapy: Secondary | ICD-10-CM | POA: Diagnosis not present

## 2021-12-29 DIAGNOSIS — D509 Iron deficiency anemia, unspecified: Secondary | ICD-10-CM | POA: Insufficient documentation

## 2021-12-29 DIAGNOSIS — E876 Hypokalemia: Secondary | ICD-10-CM | POA: Diagnosis not present

## 2021-12-29 LAB — RAD ONC ARIA SESSION SUMMARY
Course Elapsed Days: 32
Plan Fractions Treated to Date: 23
Plan Prescribed Dose Per Fraction: 2 Gy
Plan Total Fractions Prescribed: 35
Plan Total Prescribed Dose: 70 Gy
Reference Point Dosage Given to Date: 46 Gy
Reference Point Session Dosage Given: 2 Gy
Session Number: 23

## 2021-12-29 MED FILL — Dexamethasone Sodium Phosphate Inj 100 MG/10ML: INTRAMUSCULAR | Qty: 1 | Status: AC

## 2022-01-01 ENCOUNTER — Encounter: Payer: Self-pay | Admitting: *Deleted

## 2022-01-01 ENCOUNTER — Other Ambulatory Visit: Payer: Self-pay

## 2022-01-01 ENCOUNTER — Inpatient Hospital Stay: Payer: Medicare Other

## 2022-01-01 ENCOUNTER — Ambulatory Visit: Payer: Medicare Other

## 2022-01-01 ENCOUNTER — Ambulatory Visit
Admission: RE | Admit: 2022-01-01 | Discharge: 2022-01-01 | Disposition: A | Discharge status: Home / Self Care | Payer: Medicare Other | Source: Ambulatory Visit | Attending: Radiation Oncology | Admitting: Radiation Oncology

## 2022-01-01 ENCOUNTER — Ambulatory Visit: Payer: Medicare Other | Admitting: Oncology

## 2022-01-01 VITALS — BP 107/63 | HR 103 | Temp 99.0°F | Resp 16 | Wt 71.4 lb

## 2022-01-01 DIAGNOSIS — C342 Malignant neoplasm of middle lobe, bronchus or lung: Secondary | ICD-10-CM

## 2022-01-01 DIAGNOSIS — Z5111 Encounter for antineoplastic chemotherapy: Secondary | ICD-10-CM | POA: Diagnosis not present

## 2022-01-01 LAB — RAD ONC ARIA SESSION SUMMARY
Course Elapsed Days: 35
Plan Fractions Treated to Date: 24
Plan Prescribed Dose Per Fraction: 2 Gy
Plan Total Fractions Prescribed: 35
Plan Total Prescribed Dose: 70 Gy
Reference Point Dosage Given to Date: 48 Gy
Reference Point Session Dosage Given: 2 Gy
Session Number: 24

## 2022-01-01 LAB — CBC WITH DIFFERENTIAL/PLATELET
Abs Immature Granulocytes: 0.04 10*3/uL (ref 0.00–0.07)
Basophils Absolute: 0 10*3/uL (ref 0.0–0.1)
Basophils Relative: 1 %
Eosinophils Absolute: 0 10*3/uL (ref 0.0–0.5)
Eosinophils Relative: 1 %
HCT: 28.8 % — ABNORMAL LOW (ref 36.0–46.0)
Hemoglobin: 9 g/dL — ABNORMAL LOW (ref 12.0–15.0)
Immature Granulocytes: 2 %
Lymphocytes Relative: 9 %
Lymphs Abs: 0.2 10*3/uL — ABNORMAL LOW (ref 0.7–4.0)
MCH: 27.4 pg (ref 26.0–34.0)
MCHC: 31.3 g/dL (ref 30.0–36.0)
MCV: 87.5 fL (ref 80.0–100.0)
Monocytes Absolute: 0.4 10*3/uL (ref 0.1–1.0)
Monocytes Relative: 13 %
Neutro Abs: 2 10*3/uL (ref 1.7–7.7)
Neutrophils Relative %: 74 %
Platelets: 170 10*3/uL (ref 150–400)
RBC: 3.29 MIL/uL — ABNORMAL LOW (ref 3.87–5.11)
RDW: 26.9 % — ABNORMAL HIGH (ref 11.5–15.5)
WBC: 2.6 10*3/uL — ABNORMAL LOW (ref 4.0–10.5)
nRBC: 0.8 % — ABNORMAL HIGH (ref 0.0–0.2)

## 2022-01-01 LAB — COMPREHENSIVE METABOLIC PANEL
ALT: 24 U/L (ref 0–44)
AST: 23 U/L (ref 15–41)
Albumin: 2.8 g/dL — ABNORMAL LOW (ref 3.5–5.0)
Alkaline Phosphatase: 56 U/L (ref 38–126)
Anion gap: 9 (ref 5–15)
BUN: 34 mg/dL — ABNORMAL HIGH (ref 8–23)
CO2: 28 mmol/L (ref 22–32)
Calcium: 8 mg/dL — ABNORMAL LOW (ref 8.9–10.3)
Chloride: 101 mmol/L (ref 98–111)
Creatinine, Ser: 0.59 mg/dL (ref 0.44–1.00)
GFR, Estimated: >60 mL/min (ref >60)
Glucose, Bld: 121 mg/dL — ABNORMAL HIGH (ref 70–99)
Potassium: 3.4 mmol/L — ABNORMAL LOW (ref 3.5–5.1)
Sodium: 138 mmol/L (ref 135–145)
Total Bilirubin: 0.5 mg/dL (ref 0.3–1.2)
Total Protein: 5 g/dL — ABNORMAL LOW (ref 6.5–8.1)

## 2022-01-01 MED ORDER — DIPHENHYDRAMINE HCL 50 MG/ML IJ SOLN
50.0000 mg | Freq: Once | INTRAMUSCULAR | Status: AC
  Administered 2022-01-01: 50 mg via INTRAVENOUS
  Filled 2022-01-01: qty 1

## 2022-01-01 MED ORDER — SODIUM CHLORIDE 0.9 % IV SOLN
45.0000 mg/m2 | Freq: Once | INTRAVENOUS | Status: AC
  Administered 2022-01-01: 54 mg via INTRAVENOUS
  Filled 2022-01-01: qty 9

## 2022-01-01 MED ORDER — HEPARIN SOD (PORK) LOCK FLUSH 100 UNIT/ML IV SOLN
500.0000 [IU] | Freq: Once | INTRAVENOUS | Status: AC | PRN
  Administered 2022-01-01: 500 [IU]
  Filled 2022-01-01: qty 5

## 2022-01-01 MED ORDER — FAMOTIDINE IN NACL 20-0.9 MG/50ML-% IV SOLN
20.0000 mg | Freq: Once | INTRAVENOUS | Status: AC
  Administered 2022-01-01: 20 mg via INTRAVENOUS
  Filled 2022-01-01: qty 50

## 2022-01-01 MED ORDER — PALONOSETRON HCL INJECTION 0.25 MG/5ML
0.2500 mg | Freq: Once | INTRAVENOUS | Status: AC
  Administered 2022-01-01: 0.25 mg via INTRAVENOUS
  Filled 2022-01-01: qty 5

## 2022-01-01 MED ORDER — SODIUM CHLORIDE 0.9 % IV SOLN
100.0000 mg | Freq: Once | INTRAVENOUS | Status: AC
  Administered 2022-01-01: 100 mg via INTRAVENOUS
  Filled 2022-01-01: qty 10

## 2022-01-01 MED ORDER — SODIUM CHLORIDE 0.9 % IV SOLN
10.0000 mg | Freq: Once | INTRAVENOUS | Status: AC
  Administered 2022-01-01: 10 mg via INTRAVENOUS
  Filled 2022-01-01: qty 10

## 2022-01-01 MED ORDER — SODIUM CHLORIDE 0.9 % IV SOLN
Freq: Once | INTRAVENOUS | Status: AC
  Filled 2022-01-01: qty 250

## 2022-01-02 ENCOUNTER — Inpatient Hospital Stay: Payer: Medicare Other

## 2022-01-02 ENCOUNTER — Ambulatory Visit
Admission: RE | Admit: 2022-01-02 | Discharge: 2022-01-02 | Disposition: A | Discharge status: Home / Self Care | Payer: Medicare Other | Source: Ambulatory Visit | Attending: Radiation Oncology | Admitting: Radiation Oncology

## 2022-01-02 ENCOUNTER — Other Ambulatory Visit: Payer: Self-pay

## 2022-01-02 DIAGNOSIS — Z5111 Encounter for antineoplastic chemotherapy: Secondary | ICD-10-CM | POA: Diagnosis not present

## 2022-01-02 LAB — RAD ONC ARIA SESSION SUMMARY
Course Elapsed Days: 36
Plan Fractions Treated to Date: 25
Plan Prescribed Dose Per Fraction: 2 Gy
Plan Total Fractions Prescribed: 35
Plan Total Prescribed Dose: 70 Gy
Reference Point Dosage Given to Date: 50 Gy
Reference Point Session Dosage Given: 2 Gy
Session Number: 25

## 2022-01-02 NOTE — Progress Notes (Signed)
Nutrition Follow-up:  Patient with non small cell lung cancer, stage IV.  Patient receiving concurrent chemotherapy and radiation.    Spoke with patient via phone for nutrition follow-up.  Patient reports that her appetite is good and she has implemented many of the strategies we talked about before (adding cottage cheese and mandarin oranges, cheese and half and half to omelette, etc).  Drinking 3 Farilife shakes per day vs 2.  "I can't do anything else."  I don't think I am going to gain any weight while on chemo and radiation."  Denies nausea or trouble swallowing.  Has some stomach pain but that has been going on for years.  Says that she has drank 2 Fairlife shakes today, had some chicken salad, chips with ranch dressing, 3 egg omelette with cheese.      Medications: reviewed  Labs: reviewed  Anthropometrics:   Weight 71 lb on 12/4 75 lb on 11/6 Most she has ever weighed 93 lb   NUTRITION DIAGNOSIS: Inadequate oral intake stable    INTERVENTION:  Patient to continue to implement strategies to increase calories and protein and maintain weight during treatment Patient prefers to reach out to RD if needed in the future.  Patient has contact information    NEXT VISIT: no follow-up Patient to contact RD if needed  F[place]on Zacharia B. Zenia Resides, [place], [place] Registered Dietitian [phone]

## 2022-01-03 ENCOUNTER — Inpatient Hospital Stay: Payer: Medicare Other

## 2022-01-03 ENCOUNTER — Encounter: Payer: Self-pay | Admitting: *Deleted

## 2022-01-03 ENCOUNTER — Ambulatory Visit
Admission: RE | Admit: 2022-01-03 | Discharge: 2022-01-03 | Disposition: A | Discharge status: Home / Self Care | Payer: Medicare Other | Source: Ambulatory Visit | Attending: Radiation Oncology | Admitting: Radiation Oncology

## 2022-01-03 ENCOUNTER — Other Ambulatory Visit: Payer: Self-pay

## 2022-01-03 DIAGNOSIS — Z5111 Encounter for antineoplastic chemotherapy: Secondary | ICD-10-CM | POA: Diagnosis not present

## 2022-01-03 LAB — RAD ONC ARIA SESSION SUMMARY
Course Elapsed Days: 37
Plan Fractions Treated to Date: 26
Plan Prescribed Dose Per Fraction: 2 Gy
Plan Total Fractions Prescribed: 35
Plan Total Prescribed Dose: 70 Gy
Reference Point Dosage Given to Date: 52 Gy
Reference Point Session Dosage Given: 2 Gy
Session Number: 26

## 2022-01-04 ENCOUNTER — Other Ambulatory Visit: Payer: Self-pay

## 2022-01-04 ENCOUNTER — Ambulatory Visit
Admission: RE | Admit: 2022-01-04 | Discharge: 2022-01-04 | Disposition: A | Discharge status: Home / Self Care | Payer: Medicare Other | Source: Ambulatory Visit | Attending: Radiation Oncology | Admitting: Radiation Oncology

## 2022-01-04 ENCOUNTER — Inpatient Hospital Stay: Payer: Medicare Other

## 2022-01-04 DIAGNOSIS — Z5111 Encounter for antineoplastic chemotherapy: Secondary | ICD-10-CM | POA: Diagnosis not present

## 2022-01-04 LAB — RAD ONC ARIA SESSION SUMMARY
Course Elapsed Days: 38
Plan Fractions Treated to Date: 27
Plan Prescribed Dose Per Fraction: 2 Gy
Plan Total Fractions Prescribed: 35
Plan Total Prescribed Dose: 70 Gy
Reference Point Dosage Given to Date: 54 Gy
Reference Point Session Dosage Given: 2 Gy
Session Number: 27

## 2022-01-05 ENCOUNTER — Ambulatory Visit
Admission: RE | Admit: 2022-01-05 | Discharge: 2022-01-05 | Disposition: A | Discharge status: Home / Self Care | Payer: Medicare Other | Source: Ambulatory Visit | Attending: Radiation Oncology | Admitting: Radiation Oncology

## 2022-01-05 ENCOUNTER — Encounter: Payer: Self-pay | Admitting: *Deleted

## 2022-01-05 ENCOUNTER — Other Ambulatory Visit: Payer: Self-pay

## 2022-01-05 ENCOUNTER — Inpatient Hospital Stay: Payer: Medicare Other

## 2022-01-05 DIAGNOSIS — Z5111 Encounter for antineoplastic chemotherapy: Secondary | ICD-10-CM | POA: Diagnosis not present

## 2022-01-05 LAB — RAD ONC ARIA SESSION SUMMARY
Course Elapsed Days: 39
Plan Fractions Treated to Date: 28
Plan Prescribed Dose Per Fraction: 2 Gy
Plan Total Fractions Prescribed: 35
Plan Total Prescribed Dose: 70 Gy
Reference Point Dosage Given to Date: 56 Gy
Reference Point Session Dosage Given: 2 Gy
Session Number: 28

## 2022-01-05 MED FILL — Dexamethasone Sodium Phosphate Inj 100 MG/10ML: INTRAMUSCULAR | Qty: 1 | Status: AC

## 2022-01-08 ENCOUNTER — Other Ambulatory Visit: Payer: Self-pay

## 2022-01-08 ENCOUNTER — Inpatient Hospital Stay: Payer: Medicare Other

## 2022-01-08 ENCOUNTER — Other Ambulatory Visit: Payer: Self-pay | Admitting: *Deleted

## 2022-01-08 ENCOUNTER — Inpatient Hospital Stay (HOSPITAL_BASED_OUTPATIENT_CLINIC_OR_DEPARTMENT_OTHER): Payer: Medicare Other | Admitting: Oncology

## 2022-01-08 ENCOUNTER — Encounter: Payer: Self-pay | Admitting: Oncology

## 2022-01-08 ENCOUNTER — Ambulatory Visit
Admission: RE | Admit: 2022-01-08 | Discharge: 2022-01-08 | Disposition: A | Discharge status: Home / Self Care | Payer: Medicare Other | Source: Ambulatory Visit | Attending: Radiation Oncology | Admitting: Radiation Oncology

## 2022-01-08 VITALS — BP 135/77 | HR 120 | Temp 97.1°F | Resp 18 | Ht 60.0 in | Wt <= 1120 oz

## 2022-01-08 VITALS — HR 110

## 2022-01-08 DIAGNOSIS — C342 Malignant neoplasm of middle lobe, bronchus or lung: Secondary | ICD-10-CM

## 2022-01-08 DIAGNOSIS — Z5111 Encounter for antineoplastic chemotherapy: Secondary | ICD-10-CM

## 2022-01-08 DIAGNOSIS — D701 Agranulocytosis secondary to cancer chemotherapy: Secondary | ICD-10-CM | POA: Diagnosis not present

## 2022-01-08 DIAGNOSIS — T451X5A Adverse effect of antineoplastic and immunosuppressive drugs, initial encounter: Secondary | ICD-10-CM | POA: Diagnosis not present

## 2022-01-08 DIAGNOSIS — D6481 Anemia due to antineoplastic chemotherapy: Secondary | ICD-10-CM | POA: Diagnosis not present

## 2022-01-08 LAB — CBC WITH DIFFERENTIAL/PLATELET
Abs Immature Granulocytes: 0.02 10*3/uL (ref 0.00–0.07)
Basophils Absolute: 0 10*3/uL (ref 0.0–0.1)
Basophils Relative: 1 %
Eosinophils Absolute: 0 10*3/uL (ref 0.0–0.5)
Eosinophils Relative: 1 %
HCT: 27.3 % — ABNORMAL LOW (ref 36.0–46.0)
Hemoglobin: 9 g/dL — ABNORMAL LOW (ref 12.0–15.0)
Immature Granulocytes: 1 %
Lymphocytes Relative: 9 %
Lymphs Abs: 0.2 10*3/uL — ABNORMAL LOW (ref 0.7–4.0)
MCH: 29.6 pg (ref 26.0–34.0)
MCHC: 33 g/dL (ref 30.0–36.0)
MCV: 89.8 fL (ref 80.0–100.0)
Monocytes Absolute: 0.4 10*3/uL (ref 0.1–1.0)
Monocytes Relative: 17 %
Neutro Abs: 1.6 10*3/uL — ABNORMAL LOW (ref 1.7–7.7)
Neutrophils Relative %: 71 %
Platelets: 164 10*3/uL (ref 150–400)
RBC: 3.04 MIL/uL — ABNORMAL LOW (ref 3.87–5.11)
RDW: 29.8 % — ABNORMAL HIGH (ref 11.5–15.5)
WBC: 2.2 10*3/uL — ABNORMAL LOW (ref 4.0–10.5)
nRBC: 0 % (ref 0.0–0.2)

## 2022-01-08 LAB — RAD ONC ARIA SESSION SUMMARY
Course Elapsed Days: 42
Plan Fractions Treated to Date: 29
Plan Prescribed Dose Per Fraction: 2 Gy
Plan Total Fractions Prescribed: 35
Plan Total Prescribed Dose: 70 Gy
Reference Point Dosage Given to Date: 58 Gy
Reference Point Session Dosage Given: 2 Gy
Session Number: 29

## 2022-01-08 LAB — COMPREHENSIVE METABOLIC PANEL
ALT: 23 U/L (ref 0–44)
AST: 23 U/L (ref 15–41)
Albumin: 2.9 g/dL — ABNORMAL LOW (ref 3.5–5.0)
Alkaline Phosphatase: 60 U/L (ref 38–126)
Anion gap: 12 (ref 5–15)
BUN: 24 mg/dL — ABNORMAL HIGH (ref 8–23)
CO2: 26 mmol/L (ref 22–32)
Calcium: 8 mg/dL — ABNORMAL LOW (ref 8.9–10.3)
Chloride: 97 mmol/L — ABNORMAL LOW (ref 98–111)
Creatinine, Ser: 0.52 mg/dL (ref 0.44–1.00)
GFR, Estimated: >60 mL/min (ref >60)
Glucose, Bld: 157 mg/dL — ABNORMAL HIGH (ref 70–99)
Potassium: 3.2 mmol/L — ABNORMAL LOW (ref 3.5–5.1)
Sodium: 135 mmol/L (ref 135–145)
Total Bilirubin: 0.5 mg/dL (ref 0.3–1.2)
Total Protein: 5.4 g/dL — ABNORMAL LOW (ref 6.5–8.1)

## 2022-01-08 MED ORDER — SODIUM CHLORIDE 0.9 % IV SOLN
109.2000 mg | Freq: Once | INTRAVENOUS | Status: AC
  Administered 2022-01-08: 110 mg via INTRAVENOUS
  Filled 2022-01-08: qty 11

## 2022-01-08 MED ORDER — FAMOTIDINE IN NACL 20-0.9 MG/50ML-% IV SOLN
20.0000 mg | Freq: Once | INTRAVENOUS | Status: AC
  Administered 2022-01-08: 20 mg via INTRAVENOUS
  Filled 2022-01-08: qty 50

## 2022-01-08 MED ORDER — SODIUM CHLORIDE 0.9 % IV SOLN
45.0000 mg/m2 | Freq: Once | INTRAVENOUS | Status: AC
  Administered 2022-01-08: 54 mg via INTRAVENOUS
  Filled 2022-01-08: qty 9

## 2022-01-08 MED ORDER — SODIUM CHLORIDE 0.9% FLUSH
10.0000 mL | INTRAVENOUS | Status: DC | PRN
  Administered 2022-01-08: 10 mL via INTRAVENOUS
  Filled 2022-01-08: qty 10

## 2022-01-08 MED ORDER — HEPARIN SOD (PORK) LOCK FLUSH 100 UNIT/ML IV SOLN
500.0000 [IU] | Freq: Once | INTRAVENOUS | Status: AC | PRN
  Filled 2022-01-08: qty 5

## 2022-01-08 MED ORDER — DIPHENHYDRAMINE HCL 50 MG/ML IJ SOLN
50.0000 mg | Freq: Once | INTRAMUSCULAR | Status: AC
  Administered 2022-01-08: 50 mg via INTRAVENOUS
  Filled 2022-01-08: qty 1

## 2022-01-08 MED ORDER — HEPARIN SOD (PORK) LOCK FLUSH 100 UNIT/ML IV SOLN
INTRAVENOUS | Status: AC
  Administered 2022-01-08: 500 [IU]
  Filled 2022-01-08: qty 5

## 2022-01-08 MED ORDER — SODIUM CHLORIDE 0.9% FLUSH
10.0000 mL | INTRAVENOUS | Status: DC | PRN
  Administered 2022-01-08: 10 mL
  Filled 2022-01-08: qty 10

## 2022-01-08 MED ORDER — SODIUM CHLORIDE 0.9 % IV SOLN
10.0000 mg | Freq: Once | INTRAVENOUS | Status: AC
  Administered 2022-01-08: 10 mg via INTRAVENOUS
  Filled 2022-01-08: qty 10

## 2022-01-08 MED ORDER — SODIUM CHLORIDE 0.9 % IV SOLN
Freq: Once | INTRAVENOUS | Status: AC
  Filled 2022-01-08: qty 250

## 2022-01-08 MED ORDER — PALONOSETRON HCL INJECTION 0.25 MG/5ML
0.2500 mg | Freq: Once | INTRAVENOUS | Status: AC
  Administered 2022-01-08: 0.25 mg via INTRAVENOUS
  Filled 2022-01-08: qty 5

## 2022-01-08 NOTE — Patient Instructions (Signed)
[facility] CANCER CTR AT [place]  Discharge Instructions: Thank you for choosing [facility] to provide your oncology and hematology care.  If you have a lab appointment with the [facility], please go directly to the [facility] and check in at the registration area.  Wear comfortable clothing and clothing appropriate for easy access to any Portacath or PICC line.   We strive to give you quality time with your provider. You may need to reschedule your appointment if you arrive late (15 or more minutes).  Arriving late affects you and other patients whose appointments are after yours.  Also, if you miss three or more appointments without notifying the office, you may be dismissed from the clinic at the provider's discretion.      For prescription refill requests, have your pharmacy contact our office and allow 72 hours for refills to be completed.    Today you received the following chemotherapy and/or immunotherapy agents Taxol and Carboplatin       To help prevent nausea and vomiting after your treatment, we encourage you to take your nausea medication as directed.  BELOW ARE SYMPTOMS THAT SHOULD BE REPORTED IMMEDIATELY: *FEVER GREATER THAN 100.4 F (38 C) OR HIGHER *CHILLS OR SWEATING *NAUSEA AND VOMITING THAT IS NOT CONTROLLED WITH YOUR NAUSEA MEDICATION *UNUSUAL SHORTNESS OF BREATH *UNUSUAL BRUISING OR BLEEDING *URINARY PROBLEMS (pain or burning when urinating, or frequent urination) *BOWEL PROBLEMS (unusual diarrhea, constipation, pain near the anus) TENDERNESS IN MOUTH AND THROAT WITH OR WITHOUT PRESENCE OF ULCERS (sore throat, sores in mouth, or a toothache) UNUSUAL RASH, SWELLING OR PAIN  UNUSUAL VAGINAL DISCHARGE OR ITCHING   Items with * indicate a potential emergency and should be followed up as soon as possible or go to the Emergency Department if any problems should occur.  Please show the CHEMOTHERAPY ALERT CARD or IMMUNOTHERAPY ALERT CARD at  check-in to the Emergency Department and triage nurse.  Should you have questions after your visit or need to cancel or reschedule your appointment, please contact [facility] CANCER [place] AT [place]  [phone] and follow the prompts.  Office hours are 8:00 a.m. to 4:30 p.m. Monday - Friday. Please note that voicemails left after 4:00 p.m. may not be returned until the following business day.  We are closed weekends and major holidays. You have access to a nurse at all times for urgent questions. Please call the main number to the clinic [phone] and follow the prompts.  For any non-urgent questions, you may also contact your provider using MyChart. We now offer e-Visits for anyone 71 and older to request care online for non-urgent symptoms. For details visit [email or web address].   Also download the MyChart app! Go to the app store, search "MyChart", open the app, select [place], and log in with your MyChart username and password.  Masks are optional in the cancer centers. If you would like for your care team to wear a mask while they are taking care of you, please let them know. For doctor visits, patients may have with them one support person who is at least 75 years old. At this time, visitors are not allowed in the infusion area.

## 2022-01-08 NOTE — Progress Notes (Signed)
Per Dr. Janese Banks ok to treat with elevated HR today of 110.

## 2022-01-08 NOTE — Progress Notes (Signed)
Patient states she is having pain "all the way down" when swallowing food.

## 2022-01-08 NOTE — Progress Notes (Signed)
Hematology/Oncology Consult note [facility]  [phone] [phone]  Patient Care Team: Albina Billet, MD as PCP - General (Internal Medicine) Marden Noble, MD (Internal Medicine) Bary Castilla, Forest Gleason, MD (General Surgery) Telford Nab, RN as Oncology Nurse Navigator Noreene Filbert, MD as Referring Physician (Radiation Oncology) Sindy Guadeloupe, MD as Consulting Physician (Oncology)   Name of the patient: Carol Shaffer  [id number]  [birth year]   Date of visit: 01/08/22  Diagnosis- non-small cell lung cancer involving the right middle lobe stage IV T1b N2 M1       Chief complaint/ Reason for visit-on treatment assessment prior to cycle 7 of weekly CarboTaxol chemotherapy  Heme/Onc history: Patient is a 76 year old female with a history oflung cancer in 2012 s/p right upper lobe resection.  Low dose chest CT on 09/04/2018 revealed multiple small pulmonary nodules are noted in the lungs bilaterally. In addition, there was a 1.53 cm concerning nodule in the right lower lobe. There was diffuse bronchial wall thickening with mild centrilobular and paraseptal emphysema; imaging findings suggestive of underlying COPD.PET scan on 09/16/2018 revealed low level FDG uptake associated with the right lower lobe lung nodule (SUV 1.57). This was a nonspecific finding. Given the size of this nodule and the low level uptake, findings may reflect inflammatory or infectious nodule. Malignancy was not entirely excluded.  Low dose chest CT on 12/04/2018 revealed a 15.3 mm RLL nodule unchanged in size.   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  There were some ground-glass attenuation components, but is predominantly solid in appearance with some internal air bronchograms, with macrolobulated and spiculated borders, clearly  increased in size.   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  Tumor was positive for CK7 and TTF-1 and negative for p40.  She is not a surgical candidate.   She received 6000 cGy SBRT to the RLL from 08/05/2019 - 08/17/2019.   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  History of iron deficiency anemia and she has undergone work-up in the past.  She has a history of gastric bypass surgery.    Patient hadCT scan of breast 2023 which showed narrowing and occlusion of the right middle lobe bronchus which was looking more prominent as compared to prior scans.  There was also concern for paratracheal adenopathy which had increased in size as compared to prior scans.  Patient was seen by pulmonary and underwent bronchoscopy guided biopsy.  Bronchial biopsies of the right middle lobe did not show any malignancy however cytology from station 7 and station 4R lymph node was consistent with adenocarcinoma.    Interval history-tolerating treatments well so far.  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  She is already on Protonix twice daily and Carafate.  Chest wall pain is currently well-controlled with as needed oxycodone.  ECOG PS- 1 Pain scale- 3 Opioid associated constipation- no  Review of systems- Review of Systems  Constitutional:  Positive for malaise/fatigue.  Respiratory:         Chest wall pain      Allergies  Allergen Reactions   Hydromorphone Hcl Itching   Bentyl [Dicyclomine] Nausea And Vomiting   Biaxin [Clarithromycin] Other (See Comments)    hallucinations   Budesonide Other (See Comments)  Ulcer   Erythromycin Nausea And Vomiting   Reglan [Metoclopramide] Other (See Comments)    tremors     Past Medical History:  Diagnosis Date   Arthritis    hands   Avascular necrosis of hip, right ([place])    Blepharospasm    Cancer ([place]) 2011    Right Upper Lobe Lobectomy   Chronic diarrhea    Chronic diarrhea    Collagenous colitis    Complication of anesthesia    usually wakes up during procedures  (endoscopy and colonoscopy)   COPD (chronic obstructive pulmonary disease) ([place])    COVID 2022   Depression    Diverticulosis    Gastric outlet obstruction    Headache    every couple of days   Hemorrhoid    History of Crohn's disease    Hypoglycemic disorder    IDA (iron deficiency anemia)    Intestinal adhesions    Multiple gastric ulcers    Multiple gastric ulcers    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[place])    Stroke ([place]) 8 yrs ago   double vision left eye     Past Surgical History:  Procedure Laterality Date   APPENDECTOMY  1989   botox injections for blepharospasm     BREAST SURGERY     CATARACT EXTRACTION     COLON RESECTION  2005   COLONOSCOPY  10-29-2008   [name]   COLONOSCOPY WITH PROPOFOL N/A 03/05/2017   Procedure: COLONOSCOPY WITH PROPOFOL;  Surgeon: Toledo, Benay Pike, MD;  Location: [facility];  Service: Gastroenterology;  Laterality: N/A;   COLOSTOMY REVERSAL  2006   ESOPHAGOGASTRODUODENOSCOPY (EGD) WITH PROPOFOL N/A 07/18/2016   Procedure: ESOPHAGOGASTRODUODENOSCOPY (EGD) WITH PROPOFOL;  Surgeon: Robert Bellow, MD;  Location: [facility];  Service: Endoscopy;  Laterality: N/A;   ESOPHAGOGASTRODUODENOSCOPY (EGD) WITH PROPOFOL N/A 08/03/2016   Procedure: ESOPHAGOGASTRODUODENOSCOPY (EGD) WITH PROPOFOL;  Surgeon: Robert Bellow, MD;  Location: [facility];  Service: Endoscopy;  Laterality: N/A;   ESOPHAGOGASTRODUODENOSCOPY (EGD) WITH PROPOFOL N/A 09/01/2018   Procedure: ESOPHAGOGASTRODUODENOSCOPY (EGD) WITH PROPOFOL;  Surgeon: Toledo, Benay Pike, MD;  Location: [facility];  Service: Gastroenterology;  Laterality: N/A;   ESOPHAGOGASTRODUODENOSCOPY (EGD) WITH PROPOFOL N/A 12/01/2018   Procedure: ESOPHAGOGASTRODUODENOSCOPY (EGD) WITH PROPOFOL;  Surgeon: Toledo, Benay Pike, MD;  Location:  [facility];  Service: Gastroenterology;  Laterality: N/A;   ESOPHAGOGASTRODUODENOSCOPY (EGD) WITH PROPOFOL N/A 02/23/2019   Procedure: ESOPHAGOGASTRODUODENOSCOPY (EGD) WITH PROPOFOL;  Surgeon: Lucilla Lame, MD;  Location: [place];  Service: Endoscopy;  Laterality: N/A;   ESOPHAGOSCOPY WITH DILITATION  2012,2015   [facility] [place]   EYE SURGERY     FLEXIBLE BRONCHOSCOPY N/A 10/30/2021   Procedure: FLEXIBLE BRONCHOSCOPY;  Surgeon: Armando Reichert, MD;  Location: [facility];  Service: Pulmonary;  Laterality: N/A;   GASTRECTOMY     gastric ulcer  1989, 1991   IR IMAGING GUIDED PORT INSERTION  11/14/2021   JOINT REPLACEMENT     right total hip arthroplasty 03/03/02   LAPAROSCOPIC LYSIS OF ADHESIONS     LUNG CANCER SURGERY  2011   LYSIS OF ADHESION     PLACEMENT OF BREAST IMPLANTS  1973   thoracotomy with right upper lobectomy and central compartment node dissection     UPPER GASTROINTESTINAL ENDOSCOPY  10-29-2008   [name]   VIDEO BRONCHOSCOPY WITH ENDOBRONCHIAL ULTRASOUND N/A 10/30/2021   Procedure: VIDEO BRONCHOSCOPY WITH ENDOBRONCHIAL ULTRASOUND;  Surgeon: Armando Reichert, MD;  Location: [facility];  Service: Pulmonary;  Laterality: N/A;    Social  History   Socioeconomic History   Marital status: Widowed    Spouse name: Not on file   Number of children: 2   Years of education: Not on file   Highest education level: Not on file  Occupational History   Occupation: retired    Comment: Pharmacist, hospital  Tobacco Use   Smoking status: Former    Packs/day: 1.00    Years: 25.00    Total pack years: 25.00    Types: Cigarettes    Quit date: 01/30/2008    Years since quitting: 13.9   Smokeless tobacco: Never  Vaping Use   Vaping Use: Never used  Substance and Sexual Activity   Alcohol use: Yes    Comment: occ. for holiday   Drug use: No   Sexual activity: Not Currently  Other Topics Concern   Not on file  Social History Narrative   Lives alone    Social Determinants of Health    Financial Resource Strain: Not on file  Food Insecurity: Not on file  Transportation Needs: Unmet Transportation Needs (01/05/2022)   PRAPARE - Hydrologist (Medical): Yes    Lack of Transportation (Non-Medical): Yes  Physical Activity: Not on file  Stress: Not on file  Social Connections: Not on file  Intimate Partner Violence: Not on file    History reviewed. No pertinent family history.   Current Outpatient Medications:    acetaminophen (TYLENOL) 500 MG tablet, Take 500 mg by mouth every 6 (six) hours as needed., Disp: , Rfl:    brimonidine-timolol (COMBIGAN) 0.2-0.5 % ophthalmic solution, INT 1 GTT IN OU BID, Disp: , Rfl:    Cholecalciferol (VITAMIN D3) 1000 units CAPS, Take 1 capsule by mouth daily., Disp: , Rfl:    dexamethasone (DECADRON) 4 MG tablet, Take 2 tablets daily for 2 days, start the day after chemotherapy. Take with food., Disp: 30 tablet, Rfl: 1   docusate (COLACE) 50 MG/5ML liquid, Take by mouth daily., Disp: , Rfl:    fluticasone (FLONASE) 50 MCG/ACT nasal spray, Place 2 sprays into both nostrils daily., Disp: , Rfl:    lidocaine-prilocaine (EMLA) cream, Apply to affected area once, Disp: 30 g, Rfl: 3   loratadine (CLARITIN) 10 MG tablet, Take 10 mg by mouth daily., Disp: , Rfl:    Multiple Vitamin (MULTI-VITAMINS) TABS, Take 1 tablet by mouth daily. once daily., Disp: , Rfl:    NON FORMULARY, Take 1 Dose by mouth daily. BONE BROTH, Disp: , Rfl:    ondansetron (ZOFRAN) 8 MG tablet, Take 1 tablet (8 mg total) by mouth every 8 (eight) hours as needed for nausea or vomiting. Start on the third day after chemotherapy., Disp: 30 tablet, Rfl: 1   oxyCODONE (OXY IR/ROXICODONE) 5 MG immediate release tablet, Take 2 tablets (10 mg total) by mouth 3 (three) times daily as needed for severe pain., Disp: 180 tablet, Rfl: 0   pantoprazole (PROTONIX) 40 MG tablet, Take 40 mg by mouth 2 (two) times daily. , Disp: , Rfl:    prochlorperazine (COMPAZINE)  10 MG tablet, Take 1 tablet (10 mg total) by mouth every 6 (six) hours as needed for nausea or vomiting. (Patient not taking: Reported on 11/27/2021), Disp: 30 tablet, Rfl: 1   sucralfate (CARAFATE) 1 G tablet, Take 1 g by mouth 4 (four) times daily. , Disp: , Rfl:    traZODone (DESYREL) 150 MG tablet, Take 300 mg by mouth at bedtime. , Disp: , Rfl:    venlafaxine XR (EFFEXOR-XR)  150 MG 24 hr capsule, Take 150 mg by mouth daily with breakfast. , Disp: , Rfl:  No current facility-administered medications for this visit.  Facility-Administered Medications Ordered in Other Visits:    CARBOplatin (PARAPLATIN) 110 mg in sodium chloride 0.9 % 100 mL chemo infusion, 110 mg, Intravenous, Once, Sindy Guadeloupe, MD   heparin lock flush 100 UNIT/ML injection, , , ,    heparin lock flush 100 unit/mL, 500 Units, Intracatheter, Once PRN, Sindy Guadeloupe, MD   PACLitaxel (TAXOL) 54 mg in sodium chloride 0.9 % 150 mL chemo infusion (</= 68m/m2), 45 mg/m2 (Treatment Plan Recorded), Intravenous, Once, RSindy Guadeloupe MD   sodium chloride flush (NS) 0.9 % injection 10 mL, 10 mL, Intracatheter, PRN, RSindy Guadeloupe MD   sodium chloride flush (NS) 0.9 % injection 10 mL, 10 mL, Intravenous, PRN, RSindy Guadeloupe MD, 10 mL at 01/08/22 0838   sodium chloride flush (NS) 0.9 % injection 10 mL, 10 mL, Intracatheter, PRN, RSindy Guadeloupe MD  Physical exam:  Vitals:   01/08/22 0904 01/08/22 0909  BP:  135/77  Pulse:  (!) 120  Resp:  18  Temp:  (!) 97.1 F (36.2 C)  TempSrc:  Tympanic  SpO2:  93%  Weight: 69 lb (31.3 kg)   Height: 5' (1.524 m)    Physical Exam Constitutional:      Comments: Thin elderly female in no acute distress  Cardiovascular:     Rate and Rhythm: Regular rhythm. Tachycardia present.     Heart sounds: Normal heart sounds.  Pulmonary:     Effort: Pulmonary effort is normal.     Breath sounds: Normal breath sounds.  Abdominal:     General: Bowel sounds are normal.     Palpations: Abdomen is  soft.  Skin:    General: Skin is warm and dry.  Neurological:     Mental Status: She is alert and oriented to person, place, and time.         Latest Ref Rng & Units 01/08/2022    8:33 AM  CMP  Glucose 70 - 99 mg/dL 157   BUN 8 - 23 mg/dL 24   Creatinine 0.44 - 1.00 mg/dL 0.52   Sodium 135 - 145 mmol/L 135   Potassium 3.5 - 5.1 mmol/L 3.2   Chloride 98 - 111 mmol/L 97   CO2 22 - 32 mmol/L 26   Calcium 8.9 - 10.3 mg/dL 8.0   Total Protein 6.5 - 8.1 g/dL 5.4   Total Bilirubin 0.3 - 1.2 mg/dL 0.5   Alkaline Phos 38 - 126 U/L 60   AST 15 - 41 U/L 23   ALT 0 - 44 U/L 23       Latest Ref Rng & Units 01/08/2022    8:33 AM  CBC  WBC 4.0 - 10.5 K/uL 2.2   Hemoglobin 12.0 - 15.0 g/dL 9.0   Hematocrit 36.0 - 46.0 % 27.3   Platelets 150 - 400 K/uL 164      Assessment and plan- Patient is a 76y.o. female with history of non-small cell lung cancer involving the right middle lobe stage IV T1b N2 M1.  She is here for on treatment assessment prior to cycle 7 of weekly CarboTaxol chemotherapy  Counts okay to proceed with cycle 7 of weekly CarboTaxol chemotherapy today which would be her last cycle.Her white cell count is low at 2.2 but ANC remains more than 1.  Since she is not receiving any further  chemotherapy I am holding off on Neupogen with this cycle.  I will plan to get a repeat CT chest abdomen pelvis with contrast and a bone scan in 2 weeks time.  I will see her thereafter to discuss the results of scans and maintenance durvalumab  Chemo-induced anemia continue to monitor  Neoplasm related pain: Continue as needed oxycodone   Visit Diagnosis 1. Encounter for antineoplastic chemotherapy   2. Non-small cell cancer of middle lobe of right lung ([place])   3. Chemotherapy induced neutropenia ([facility])   4. Antineoplastic chemotherapy induced anemia      Dr. Randa Evens, MD, MPH [facility] [phone] 01/08/2022 10:44 AM

## 2022-01-09 ENCOUNTER — Other Ambulatory Visit: Payer: Self-pay | Admitting: *Deleted

## 2022-01-09 ENCOUNTER — Other Ambulatory Visit: Payer: Self-pay

## 2022-01-09 ENCOUNTER — Inpatient Hospital Stay: Payer: Medicare Other

## 2022-01-09 ENCOUNTER — Ambulatory Visit
Admission: RE | Admit: 2022-01-09 | Discharge: 2022-01-09 | Disposition: A | Discharge status: Home / Self Care | Payer: Medicare Other | Source: Ambulatory Visit | Attending: Radiation Oncology | Admitting: Radiation Oncology

## 2022-01-09 DIAGNOSIS — Z5111 Encounter for antineoplastic chemotherapy: Secondary | ICD-10-CM | POA: Diagnosis not present

## 2022-01-09 LAB — RAD ONC ARIA SESSION SUMMARY
Course Elapsed Days: 43
Plan Fractions Treated to Date: 30
Plan Prescribed Dose Per Fraction: 2 Gy
Plan Total Fractions Prescribed: 35
Plan Total Prescribed Dose: 70 Gy
Reference Point Dosage Given to Date: 60 Gy
Reference Point Session Dosage Given: 2 Gy
Session Number: 30

## 2022-01-09 MED ORDER — DEXAMETHASONE 2 MG PO TABS: 2.0000 mg | ORAL_TABLET | Freq: Two times a day (BID) | ORAL | 0 refills | Status: DC

## 2022-01-09 MED ORDER — LANSOPRAZOLE 30 MG PO CPDR: 30.0000 mg | DELAYED_RELEASE_CAPSULE | Freq: Every day | ORAL | 2 refills | Status: DC

## 2022-01-10 ENCOUNTER — Telehealth: Payer: Self-pay | Admitting: *Deleted

## 2022-01-10 ENCOUNTER — Ambulatory Visit: Payer: Medicare Other

## 2022-01-10 ENCOUNTER — Inpatient Hospital Stay: Payer: Medicare Other

## 2022-01-10 NOTE — Telephone Encounter (Signed)
Called patient to ask if her throat was better today after starting Decadron. Left Message Requested call back.

## 2022-01-11 ENCOUNTER — Inpatient Hospital Stay: Payer: Medicare Other

## 2022-01-11 ENCOUNTER — Ambulatory Visit: Payer: Medicare Other

## 2022-01-11 ENCOUNTER — Telehealth: Payer: Self-pay | Admitting: *Deleted

## 2022-01-11 NOTE — Telephone Encounter (Signed)
Patient reports this morning that she is feeling better she is drinking adequate fluid protein drinks (48 oz) and is able to consume soup. Writer offered and [facility] visit today if needed. Patient said she would call if she did not continue to improve.

## 2022-01-12 ENCOUNTER — Inpatient Hospital Stay: Payer: Medicare Other

## 2022-01-12 ENCOUNTER — Ambulatory Visit: Payer: Medicare Other

## 2022-01-15 ENCOUNTER — Inpatient Hospital Stay (HOSPITAL_BASED_OUTPATIENT_CLINIC_OR_DEPARTMENT_OTHER): Payer: Medicare Other | Admitting: Hospice and Palliative Medicine

## 2022-01-15 ENCOUNTER — Inpatient Hospital Stay: Payer: Medicare Other

## 2022-01-15 ENCOUNTER — Other Ambulatory Visit: Payer: Self-pay

## 2022-01-15 ENCOUNTER — Ambulatory Visit
Admission: RE | Admit: 2022-01-15 | Discharge: 2022-01-15 | Disposition: A | Discharge status: Home / Self Care | Payer: Medicare Other | Source: Ambulatory Visit | Attending: Radiation Oncology | Admitting: Radiation Oncology

## 2022-01-15 ENCOUNTER — Encounter: Payer: Self-pay | Admitting: Hospice and Palliative Medicine

## 2022-01-15 VITALS — BP 114/76 | HR 111 | Temp 97.9°F | Resp 20 | Ht 60.0 in | Wt <= 1120 oz

## 2022-01-15 DIAGNOSIS — Z95828 Presence of other vascular implants and grafts: Secondary | ICD-10-CM

## 2022-01-15 DIAGNOSIS — E876 Hypokalemia: Secondary | ICD-10-CM

## 2022-01-15 DIAGNOSIS — E860 Dehydration: Secondary | ICD-10-CM

## 2022-01-15 DIAGNOSIS — R531 Weakness: Secondary | ICD-10-CM

## 2022-01-15 DIAGNOSIS — C342 Malignant neoplasm of middle lobe, bronchus or lung: Secondary | ICD-10-CM | POA: Diagnosis not present

## 2022-01-15 DIAGNOSIS — Z5111 Encounter for antineoplastic chemotherapy: Secondary | ICD-10-CM | POA: Diagnosis not present

## 2022-01-15 LAB — COMPREHENSIVE METABOLIC PANEL
ALT: 23 U/L (ref 0–44)
AST: 19 U/L (ref 15–41)
Albumin: 2.9 g/dL — ABNORMAL LOW (ref 3.5–5.0)
Alkaline Phosphatase: 54 U/L (ref 38–126)
Anion gap: 10 (ref 5–15)
BUN: 40 mg/dL — ABNORMAL HIGH (ref 8–23)
CO2: 26 mmol/L (ref 22–32)
Calcium: 8.3 mg/dL — ABNORMAL LOW (ref 8.9–10.3)
Chloride: 101 mmol/L (ref 98–111)
Creatinine, Ser: 0.47 mg/dL (ref 0.44–1.00)
GFR, Estimated: >60 mL/min (ref >60)
Glucose, Bld: 133 mg/dL — ABNORMAL HIGH (ref 70–99)
Potassium: 3.4 mmol/L — ABNORMAL LOW (ref 3.5–5.1)
Sodium: 137 mmol/L (ref 135–145)
Total Bilirubin: 0.4 mg/dL (ref 0.3–1.2)
Total Protein: 5.4 g/dL — ABNORMAL LOW (ref 6.5–8.1)

## 2022-01-15 LAB — RAD ONC ARIA SESSION SUMMARY
Course Elapsed Days: 49
Plan Fractions Treated to Date: 31
Plan Prescribed Dose Per Fraction: 2 Gy
Plan Total Fractions Prescribed: 35
Plan Total Prescribed Dose: 70 Gy
Reference Point Dosage Given to Date: 62 Gy
Reference Point Session Dosage Given: 2 Gy
Session Number: 31

## 2022-01-15 LAB — CBC WITH DIFFERENTIAL/PLATELET
Abs Immature Granulocytes: 0.13 10*3/uL — ABNORMAL HIGH (ref 0.00–0.07)
Basophils Absolute: 0 10*3/uL (ref 0.0–0.1)
Basophils Relative: 1 %
Eosinophils Absolute: 0 10*3/uL (ref 0.0–0.5)
Eosinophils Relative: 0 %
HCT: 26.7 % — ABNORMAL LOW (ref 36.0–46.0)
Hemoglobin: 8.6 g/dL — ABNORMAL LOW (ref 12.0–15.0)
Immature Granulocytes: 5 %
Lymphocytes Relative: 6 %
Lymphs Abs: 0.2 10*3/uL — ABNORMAL LOW (ref 0.7–4.0)
MCH: 29.8 pg (ref 26.0–34.0)
MCHC: 32.2 g/dL (ref 30.0–36.0)
MCV: 92.4 fL (ref 80.0–100.0)
Monocytes Absolute: 0.4 10*3/uL (ref 0.1–1.0)
Monocytes Relative: 14 %
Neutro Abs: 2.2 10*3/uL (ref 1.7–7.7)
Neutrophils Relative %: 74 %
Platelets: 252 10*3/uL (ref 150–400)
RBC: 2.89 MIL/uL — ABNORMAL LOW (ref 3.87–5.11)
RDW: 31.2 % — ABNORMAL HIGH (ref 11.5–15.5)
WBC: 2.9 10*3/uL — ABNORMAL LOW (ref 4.0–10.5)
nRBC: 2.4 % — ABNORMAL HIGH (ref 0.0–0.2)

## 2022-01-15 LAB — MAGNESIUM: Magnesium: 1.4 mg/dL — ABNORMAL LOW (ref 1.7–2.4)

## 2022-01-15 MED ORDER — MAGNESIUM CHLORIDE 64 MG PO TBEC: 1.0000 | DELAYED_RELEASE_TABLET | Freq: Every day | ORAL | 0 refills | Status: DC

## 2022-01-15 MED ORDER — MAGNESIUM SULFATE 2 GM/50ML IV SOLN
2.0000 g | Freq: Once | INTRAVENOUS | Status: AC
  Administered 2022-01-15: 2 g via INTRAVENOUS
  Filled 2022-01-15: qty 50

## 2022-01-15 MED ORDER — HEPARIN SOD (PORK) LOCK FLUSH 100 UNIT/ML IV SOLN
500.0000 [IU] | Freq: Once | INTRAVENOUS | Status: AC
  Administered 2022-01-15: 500 [IU]
  Filled 2022-01-15: qty 5

## 2022-01-15 MED ORDER — HEPARIN SOD (PORK) LOCK FLUSH 100 UNIT/ML IV SOLN
INTRAVENOUS | Status: AC
  Filled 2022-01-15: qty 5

## 2022-01-15 MED ORDER — SODIUM CHLORIDE 0.9 % IV SOLN
10.0000 mg | Freq: Once | INTRAVENOUS | Status: AC
  Administered 2022-01-15: 10 mg via INTRAVENOUS
  Filled 2022-01-15: qty 1

## 2022-01-15 MED ORDER — SODIUM CHLORIDE 0.9 % IV SOLN
INTRAVENOUS | Status: DC
  Filled 2022-01-15 (×2): qty 250

## 2022-01-15 MED ORDER — POTASSIUM CHLORIDE 20 MEQ/100ML IV SOLN
20.0000 meq | Freq: Once | INTRAVENOUS | Status: AC
  Administered 2022-01-15: 20 meq via INTRAVENOUS

## 2022-01-15 MED ORDER — POTASSIUM CHLORIDE CRYS ER 10 MEQ PO TBCR: 10.0000 meq | EXTENDED_RELEASE_TABLET | Freq: Every day | ORAL | 0 refills | Status: DC

## 2022-01-15 MED ORDER — SODIUM CHLORIDE 0.9% FLUSH
10.0000 mL | Freq: Once | INTRAVENOUS | Status: AC
  Administered 2022-01-15: 10 mL via INTRAVENOUS
  Filled 2022-01-15: qty 10

## 2022-01-15 NOTE — Progress Notes (Signed)
Symptom Management [place] at [facility] [phone] [phone]  Patient Care Team: Albina Billet, MD as PCP - General (Internal Medicine) Marden Noble, MD (Internal Medicine) Bary Castilla, [place] Gleason, MD (General Surgery) Telford Nab, RN as Oncology Nurse Navigator Noreene Filbert, MD as Referring Physician (Radiation Oncology) Sindy Guadeloupe, MD as Consulting Physician (Oncology)   NAME OF PATIENT: Carol Shaffer  [id number]  06-01-1945   DATE OF VISIT: 01/15/22  REASON FOR CONSULT: Carol Shaffer is a 76 y.o. female with multiple medical problems including stage IV non-small cell lung cancer.  Patient is on systemic chemo and XRT.  INTERVAL HISTORY: Patient was last seen by Dr. Janese Banks on 01/08/2022 when she received cycle 7 of weekly CarboTaxol chemotherapy, which was her last cycle.  Plan was for repeat CT chest, abdomen, and pelvis and a bone scan with and likely maintenance Durvalumab.  Patient was here for XRT and was an add-on to [facility] schedule due to weakness/fatigue.  Patient reports that she had developed sore throat, which negatively impacted her oral intake.  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  Patient says oral intake is still not back to baseline but is slowly improving.  She denies nausea, vomiting, diarrhea, constipation.  No shortness of breath or chest pain.  No fever or chills.  Denies any neurologic complaints. Denies recent fevers or illnesses. Denies any easy bleeding or bruising.  Denies urinary complaints. Patient offers no further specific complaints today.  PAST MEDICAL HISTORY: Past Medical History:  Diagnosis Date   Arthritis    hands   Avascular necrosis of hip, right ([place])    Blepharospasm    Cancer ([place]) 2011   Right Upper Lobe Lobectomy   Chronic diarrhea    Chronic diarrhea    Collagenous colitis     Complication of anesthesia    usually wakes up during procedures  (endoscopy and colonoscopy)   COPD (chronic obstructive pulmonary disease) ([place])    COVID 2022   Depression    Diverticulosis    Gastric outlet obstruction    Headache    every couple of days   Hemorrhoid    History of Crohn's disease    Hypoglycemic disorder    IDA (iron deficiency anemia)    Intestinal adhesions    Multiple gastric ulcers    Multiple gastric ulcers    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[place])    Stroke ([place]) 8 yrs ago   double vision left eye    PAST SURGICAL HISTORY:  Past Surgical History:  Procedure Laterality Date   APPENDECTOMY  1989   botox injections for blepharospasm     BREAST SURGERY     CATARACT EXTRACTION     COLON RESECTION  2005   COLONOSCOPY  10-29-2008   [name]   COLONOSCOPY WITH PROPOFOL N/A 03/05/2017   Procedure: COLONOSCOPY WITH PROPOFOL;  Surgeon: Toledo, Benay Pike, MD;  Location: [facility] ENDOSCOPY;  Service: Gastroenterology;  Laterality: N/A;   COLOSTOMY REVERSAL  2006   ESOPHAGOGASTRODUODENOSCOPY (EGD) WITH PROPOFOL N/A 07/18/2016   Procedure: ESOPHAGOGASTRODUODENOSCOPY (EGD) WITH PROPOFOL;  Surgeon: Robert Bellow, MD;  Location: [facility] ENDOSCOPY;  Service: Endoscopy;  Laterality: N/A;   ESOPHAGOGASTRODUODENOSCOPY (EGD) WITH PROPOFOL N/A 08/03/2016   Procedure: ESOPHAGOGASTRODUODENOSCOPY (EGD) WITH PROPOFOL;  Surgeon: Robert Bellow, MD;  Location: [facility] ENDOS[facility];  Service: Endoscopy;  Laterality: N/A;   ESOPHAGOGASTRODUODENOSCOPY (EGD) WITH PROPOFOL  N/A 09/01/2018   Procedure: ESOPHAGOGASTRODUODENOSCOPY (EGD) WITH PROPOFOL;  Surgeon: Toledo, Benay Pike, MD;  Location: [facility] END[facility];  Service: Gastroenterology;  Laterality: N/A;   ESOPHAGOGASTRODUODENOSCOPY (EGD) WITH PROPOFOL N/A 12/01/2018   Procedure: ESOPHAGOGASTRODUODENOSCOPY (EGD) WITH PROPOFOL;  Surgeon: Toledo, Benay Pike, MD;  Location: [facility] END[facility];  Service: Gastroenterology;  Laterality: N/A;    ESOPHAGOGASTRODUODENOSCOPY (EGD) WITH PROPOFOL N/A 02/23/2019   Procedure: ESOPHAGOGASTRODUODENOSCOPY (EGD) WITH PROPOFOL;  Surgeon: Lucilla Lame, MD;  Location: [place];  Service: Endoscopy;  Laterality: N/A;   ESOPHAGOSCOPY WITH DILITATION  2012,2015   [facility] [place]   EYE SURGERY     FLEXIBLE BRONCHOSCOPY N/A 10/30/2021   Procedure: FLEXIBLE BRONCHOSCOPY;  Surgeon: Armando Reichert, MD;  Location: [facility];  Service: Pulmonary;  Laterality: N/A;   GASTRECTOMY     gastric ulcer  1989, 1991   IR IMAGING GUIDED PORT INSERTION  11/14/2021   JOINT REPLACEMENT     right total hip arthroplasty 03/03/02   LAPAROSCOPIC LYSIS OF ADHESIONS     LUNG CANCER SURGERY  2011   LYSIS OF ADHESION     PLACEMENT OF BREAST IMPLANTS  1973   thoracotomy with right upper lobectomy and central compartment node dissection     UPPER GASTROINTESTINAL ENDOSCOPY  10-29-2008   [name]   VIDEO BRONCHOSCOPY WITH ENDOBRONCHIAL ULTRASOUND N/A 10/30/2021   Procedure: VIDEO BRONCHOSCOPY WITH ENDOBRONCHIAL ULTRASOUND;  Surgeon: Armando Reichert, MD;  Location: [facility];  Service: Pulmonary;  Laterality: N/A;    HEMATOLOGY/ONCOLOGY HISTORY:  Oncology History  Non-small cell cancer of middle lobe of right lung ([place])  06/29/2019 Initial Diagnosis   Non-small cell cancer of middle lobe of right lung ([place])   11/03/2021 Cancer Staging   Staging form: Lung, AJCC 8th Edition - Clinical stage from 11/03/2021: Stage IV (cT1b, cN2, cM1) - Signed by Sindy Guadeloupe, MD on 11/13/2021 Histopathologic type: Adenocarcinoma, NOS   11/27/2021 -  Chemotherapy   Patient is on Treatment Plan : LUNG Carboplatin + Paclitaxel + XRT q7d       ALLERGIES:  is allergic to hydromorphone hcl, bentyl [dicyclomine], biaxin [clarithromycin], budesonide, erythromycin, and reglan [metoclopramide].  MEDICATIONS:  Current Outpatient Medications  Medication Sig Dispense Refill   lansoprazole (PREVACID) 30 MG capsule Take 1 capsule (30 mg total)  by mouth daily at 12 noon. 30 capsule 2   acetaminophen (TYLENOL) 500 MG tablet Take 500 mg by mouth every 6 (six) hours as needed.     brimonidine-timolol (COMBIGAN) 0.2-0.5 % ophthalmic solution INT 1 GTT IN OU BID     Cholecalciferol (VITAMIN D3) 1000 units CAPS Take 1 capsule by mouth daily.     dexamethasone (DECADRON) 2 MG tablet Take 1 tablet (2 mg total) by mouth 2 (two) times daily with a meal. 60 tablet 0   dexamethasone (DECADRON) 4 MG tablet Take 2 tablets daily for 2 days, start the day after chemotherapy. Take with food. 30 tablet 1   docusate (COLACE) 50 MG/5ML liquid Take by mouth daily.     fluticasone (FLONASE) 50 MCG/ACT nasal spray Place 2 sprays into both nostrils daily.     lidocaine-prilocaine (EMLA) cream Apply to affected area once 30 g 3   loratadine (CLARITIN) 10 MG tablet Take 10 mg by mouth daily.     Multiple Vitamin (MULTI-VITAMINS) TABS Take 1 tablet by mouth daily. once daily.     NON FORMULARY Take 1 Dose by mouth daily. BONE BROTH     ondansetron (ZOFRAN) 8 MG tablet Take 1 tablet (  8 mg total) by mouth every 8 (eight) hours as needed for nausea or vomiting. Start on the third day after chemotherapy. 30 tablet 1   oxyCODONE (OXY IR/ROXICODONE) 5 MG immediate release tablet Take 2 tablets (10 mg total) by mouth 3 (three) times daily as needed for severe pain. 180 tablet 0   pantoprazole (PROTONIX) 40 MG tablet Take 40 mg by mouth 2 (two) times daily.      prochlorperazine (COMPAZINE) 10 MG tablet Take 1 tablet (10 mg total) by mouth every 6 (six) hours as needed for nausea or vomiting. (Patient not taking: Reported on 11/27/2021) 30 tablet 1   sucralfate (CARAFATE) 1 G tablet Take 1 g by mouth 4 (four) times daily.      traZODone (DESYREL) 150 MG tablet Take 300 mg by mouth at bedtime.      venlafaxine XR (EFFEXOR-XR) 150 MG 24 hr capsule Take 150 mg by mouth daily with breakfast.      No current facility-administered medications for this visit.    Facility-Administered Medications Ordered in Other Visits  Medication Dose Route Frequency Provider Last Rate Last Admin   0.9 %  sodium chloride infusion   Intravenous Continuous Alexza Norbeck, Kirt Boys, NP 999 mL/hr at 01/15/22 0917 New Bag at 01/15/22 0917   heparin lock flush 100 UNIT/ML injection            heparin lock flush 100 unit/mL  500 Units Intracatheter Once Rashawna Scoles, Vonna Kotyk R, NP       sodium chloride flush (NS) 0.9 % injection 10 mL  10 mL Intracatheter PRN Sindy Guadeloupe, MD        VITAL SIGNS: There were no vitals taken for this visit. There were no vitals filed for this visit.  Estimated body mass index is 13.48 kg/m as calculated from the following:   Height as of 01/08/22: 5' (1.524 m).   Weight as of 01/08/22: 69 lb (31.3 kg).  LABS: CBC:    Component Value Date/Time   WBC 2.2 (L) 01/08/2022 0833   HGB 9.0 (L) 01/08/2022 0833   HCT 27.3 (L) 01/08/2022 0833   PLT 164 01/08/2022 0833   MCV 89.8 01/08/2022 0833   NEUTROABS 1.6 (L) 01/08/2022 0833   LYMPHSABS 0.2 (L) 01/08/2022 0833   MONOABS 0.4 01/08/2022 0833   EOSABS 0.0 01/08/2022 0833   BASOSABS 0.0 01/08/2022 0833   Comprehensive Metabolic Panel:    Component Value Date/Time   NA 135 01/08/2022 0833   K 3.2 (L) 01/08/2022 0833   CL 97 (L) 01/08/2022 0833   CO2 26 01/08/2022 0833   BUN 24 (H) 01/08/2022 0833   CREATININE 0.52 01/08/2022 0833   CREATININE 0.88 12/24/2011 0835   GLUCOSE 157 (H) 01/08/2022 0833   CALCIUM 8.0 (L) 01/08/2022 0833   AST 23 01/08/2022 0833   ALT 23 01/08/2022 0833   ALKPHOS 60 01/08/2022 0833   BILITOT 0.5 01/08/2022 0833   PROT 5.4 (L) 01/08/2022 0833   ALBUMIN 2.9 (L) 01/08/2022 2094    RADIOGRAPHIC STUDIES: No results found.  PERFORMANCE STATUS (ECOG) : 2 - Symptomatic, <50% confined to bed  Review of Systems Unless otherwise noted, a complete review of systems is negative.  Physical Exam General: NAD Cardiovascular: regular rate and rhythm Pulmonary:  clear ant fields Abdomen: soft, nontender, + bowel sounds GU: no suprapubic tenderness Extremities: no edema, no joint deformities Skin: no rashes Neurological: Weakness but otherwise nonfocal  IMPRESSION/PLAN: Weakness/fatigue -likely multifactorial in setting of recent cancer treatment (chemo/radiation)  in addition to poor oral intake.  Patient feels like her oral intake is slowly improving as are her other symptoms.  Labs suggestive of mild hypokalemia/hypomagnesemia and will replete both today.  Will start oral supplementation and plan to bring patient back later this week for repeat labs/fluids.   Patient expressed understanding and was in agreement with this plan. She also understands that She can call clinic at any time with any questions, concerns, or complaints.   Thank you for allowing me to participate in the care of this very pleasant patient.   Time Total: 15 minutes  Visit consisted of counseling and education dealing with the complex and emotionally intense issues of symptom management in the setting of serious illness.Greater than 50%  of this time was spent counseling and coordinating care related to the above assessment and plan.  Signed by: Altha Harm, PhD, NP-C

## 2022-01-16 ENCOUNTER — Inpatient Hospital Stay: Payer: Medicare Other

## 2022-01-16 ENCOUNTER — Ambulatory Visit
Admission: RE | Admit: 2022-01-16 | Discharge: 2022-01-16 | Disposition: A | Discharge status: Home / Self Care | Payer: Medicare Other | Source: Ambulatory Visit | Attending: Radiation Oncology | Admitting: Radiation Oncology

## 2022-01-16 ENCOUNTER — Encounter: Payer: Self-pay | Admitting: *Deleted

## 2022-01-16 ENCOUNTER — Other Ambulatory Visit: Payer: Self-pay

## 2022-01-16 DIAGNOSIS — Z5111 Encounter for antineoplastic chemotherapy: Secondary | ICD-10-CM | POA: Diagnosis not present

## 2022-01-16 LAB — RAD ONC ARIA SESSION SUMMARY
Course Elapsed Days: 50
Plan Fractions Treated to Date: 32
Plan Prescribed Dose Per Fraction: 2 Gy
Plan Total Fractions Prescribed: 35
Plan Total Prescribed Dose: 70 Gy
Reference Point Dosage Given to Date: 64 Gy
Reference Point Session Dosage Given: 2 Gy
Session Number: 32

## 2022-01-17 ENCOUNTER — Ambulatory Visit
Admission: RE | Admit: 2022-01-17 | Discharge: 2022-01-17 | Disposition: A | Discharge status: Home / Self Care | Payer: Medicare Other | Source: Ambulatory Visit | Attending: Radiation Oncology | Admitting: Radiation Oncology

## 2022-01-17 ENCOUNTER — Ambulatory Visit: Payer: Medicare Other

## 2022-01-17 ENCOUNTER — Other Ambulatory Visit: Payer: Self-pay

## 2022-01-17 ENCOUNTER — Inpatient Hospital Stay: Payer: Medicare Other

## 2022-01-17 ENCOUNTER — Other Ambulatory Visit: Payer: Medicare Other

## 2022-01-17 DIAGNOSIS — Z5111 Encounter for antineoplastic chemotherapy: Secondary | ICD-10-CM | POA: Diagnosis not present

## 2022-01-17 LAB — RAD ONC ARIA SESSION SUMMARY
Course Elapsed Days: 51
Plan Fractions Treated to Date: 33
Plan Prescribed Dose Per Fraction: 2 Gy
Plan Total Fractions Prescribed: 35
Plan Total Prescribed Dose: 70 Gy
Reference Point Dosage Given to Date: 66 Gy
Reference Point Session Dosage Given: 2 Gy
Session Number: 33

## 2022-01-18 ENCOUNTER — Other Ambulatory Visit: Payer: Self-pay

## 2022-01-18 ENCOUNTER — Encounter: Payer: Self-pay | Admitting: *Deleted

## 2022-01-18 ENCOUNTER — Ambulatory Visit
Admission: RE | Admit: 2022-01-18 | Discharge: 2022-01-18 | Disposition: A | Discharge status: Home / Self Care | Payer: Medicare Other | Source: Ambulatory Visit | Attending: Radiation Oncology | Admitting: Radiation Oncology

## 2022-01-18 ENCOUNTER — Inpatient Hospital Stay: Payer: Medicare Other

## 2022-01-18 DIAGNOSIS — Z5111 Encounter for antineoplastic chemotherapy: Secondary | ICD-10-CM | POA: Diagnosis not present

## 2022-01-18 LAB — RAD ONC ARIA SESSION SUMMARY
Course Elapsed Days: 52
Plan Fractions Treated to Date: 34
Plan Prescribed Dose Per Fraction: 2 Gy
Plan Total Fractions Prescribed: 35
Plan Total Prescribed Dose: 70 Gy
Reference Point Dosage Given to Date: 68 Gy
Reference Point Session Dosage Given: 2 Gy
Session Number: 34

## 2022-01-19 ENCOUNTER — Other Ambulatory Visit: Payer: Medicare Other

## 2022-01-19 ENCOUNTER — Ambulatory Visit: Payer: Medicare Other

## 2022-01-19 ENCOUNTER — Inpatient Hospital Stay: Payer: Medicare Other

## 2022-01-19 ENCOUNTER — Ambulatory Visit
Admission: RE | Admit: 2022-01-19 | Discharge: 2022-01-19 | Disposition: A | Discharge status: Home / Self Care | Payer: Medicare Other | Source: Ambulatory Visit | Attending: Radiation Oncology | Admitting: Radiation Oncology

## 2022-01-19 ENCOUNTER — Other Ambulatory Visit: Payer: Self-pay

## 2022-01-19 DIAGNOSIS — Z5111 Encounter for antineoplastic chemotherapy: Secondary | ICD-10-CM | POA: Diagnosis not present

## 2022-01-19 DIAGNOSIS — C342 Malignant neoplasm of middle lobe, bronchus or lung: Secondary | ICD-10-CM

## 2022-01-19 LAB — CBC WITH DIFFERENTIAL/PLATELET
Abs Immature Granulocytes: 0.19 10*3/uL — ABNORMAL HIGH (ref 0.00–0.07)
Basophils Absolute: 0 10*3/uL (ref 0.0–0.1)
Basophils Relative: 1 %
Eosinophils Absolute: 0 10*3/uL (ref 0.0–0.5)
Eosinophils Relative: 0 %
HCT: 27.4 % — ABNORMAL LOW (ref 36.0–46.0)
Hemoglobin: 8.8 g/dL — ABNORMAL LOW (ref 12.0–15.0)
Immature Granulocytes: 6 %
Lymphocytes Relative: 3 %
Lymphs Abs: 0.1 10*3/uL — ABNORMAL LOW (ref 0.7–4.0)
MCH: 30.7 pg (ref 26.0–34.0)
MCHC: 32.1 g/dL (ref 30.0–36.0)
MCV: 95.5 fL (ref 80.0–100.0)
Monocytes Absolute: 0.8 10*3/uL (ref 0.1–1.0)
Monocytes Relative: 23 %
Neutro Abs: 2.2 10*3/uL (ref 1.7–7.7)
Neutrophils Relative %: 67 %
Platelets: 259 10*3/uL (ref 150–400)
RBC: 2.87 MIL/uL — ABNORMAL LOW (ref 3.87–5.11)
RDW: 33.1 % — ABNORMAL HIGH (ref 11.5–15.5)
Smear Review: NORMAL
WBC: 3.3 10*3/uL — ABNORMAL LOW (ref 4.0–10.5)
nRBC: 0.6 % — ABNORMAL HIGH (ref 0.0–0.2)

## 2022-01-19 LAB — COMPREHENSIVE METABOLIC PANEL
ALT: 26 U/L (ref 0–44)
AST: 20 U/L (ref 15–41)
Albumin: 3.1 g/dL — ABNORMAL LOW (ref 3.5–5.0)
Alkaline Phosphatase: 62 U/L (ref 38–126)
Anion gap: 8 (ref 5–15)
BUN: 38 mg/dL — ABNORMAL HIGH (ref 8–23)
CO2: 26 mmol/L (ref 22–32)
Calcium: 8.2 mg/dL — ABNORMAL LOW (ref 8.9–10.3)
Chloride: 102 mmol/L (ref 98–111)
Creatinine, Ser: 0.4 mg/dL — ABNORMAL LOW (ref 0.44–1.00)
GFR, Estimated: >60 mL/min (ref >60)
Glucose, Bld: 114 mg/dL — ABNORMAL HIGH (ref 70–99)
Potassium: 3.7 mmol/L (ref 3.5–5.1)
Sodium: 136 mmol/L (ref 135–145)
Total Bilirubin: 0.4 mg/dL (ref 0.3–1.2)
Total Protein: 5.3 g/dL — ABNORMAL LOW (ref 6.5–8.1)

## 2022-01-19 LAB — MAGNESIUM: Magnesium: 1.4 mg/dL — ABNORMAL LOW (ref 1.7–2.4)

## 2022-01-19 LAB — RAD ONC ARIA SESSION SUMMARY
Course Elapsed Days: 53
Plan Fractions Treated to Date: 35
Plan Prescribed Dose Per Fraction: 2 Gy
Plan Total Fractions Prescribed: 35
Plan Total Prescribed Dose: 70 Gy
Reference Point Dosage Given to Date: 70 Gy
Reference Point Session Dosage Given: 2 Gy
Session Number: 35

## 2022-01-19 MED ORDER — HEPARIN SOD (PORK) LOCK FLUSH 100 UNIT/ML IV SOLN
500.0000 [IU] | Freq: Once | INTRAVENOUS | Status: AC
  Administered 2022-01-19: 500 [IU] via INTRAVENOUS
  Filled 2022-01-19: qty 5

## 2022-01-19 MED ORDER — MAGNESIUM SULFATE 4 GM/100ML IV SOLN
4.0000 g | Freq: Once | INTRAVENOUS | Status: AC
  Administered 2022-01-19: 4 g via INTRAVENOUS
  Filled 2022-01-19: qty 100

## 2022-01-19 MED ORDER — SODIUM CHLORIDE 0.9 % IV SOLN
Freq: Once | INTRAVENOUS | Status: AC
  Filled 2022-01-19: qty 250

## 2022-01-19 MED ORDER — SODIUM CHLORIDE 0.9% FLUSH
10.0000 mL | Freq: Once | INTRAVENOUS | Status: AC
  Administered 2022-01-19: 10 mL via INTRAVENOUS
  Filled 2022-01-19: qty 10

## 2022-01-24 ENCOUNTER — Inpatient Hospital Stay: Payer: Medicare Other

## 2022-01-24 ENCOUNTER — Encounter
Admission: RE | Admit: 2022-01-24 | Discharge: 2022-01-24 | Disposition: A | Discharge status: Home / Self Care | Payer: Medicare Other | Source: Ambulatory Visit | Attending: Oncology | Admitting: Oncology

## 2022-01-24 ENCOUNTER — Ambulatory Visit
Admission: RE | Admit: 2022-01-24 | Discharge: 2022-01-24 | Disposition: A | Discharge status: Home / Self Care | Payer: Medicare Other | Source: Ambulatory Visit | Attending: Oncology | Admitting: Oncology

## 2022-01-24 DIAGNOSIS — C342 Malignant neoplasm of middle lobe, bronchus or lung: Secondary | ICD-10-CM | POA: Diagnosis not present

## 2022-01-24 MED ORDER — TECHNETIUM TC 99M MEDRONATE IV KIT
20.0000 | PACK | Freq: Once | INTRAVENOUS | Status: AC | PRN
  Administered 2022-01-24: 20.01 via INTRAVENOUS

## 2022-01-24 MED ORDER — BARIUM SULFATE 2 % PO SUSP: 450.0000 mL | Freq: Once | ORAL | Status: DC

## 2022-01-24 MED ORDER — IOHEXOL 300 MG/ML  SOLN
65.0000 mL | Freq: Once | INTRAMUSCULAR | Status: AC | PRN
  Administered 2022-01-24: 65 mL via INTRAVENOUS

## 2022-01-26 ENCOUNTER — Inpatient Hospital Stay (HOSPITAL_BASED_OUTPATIENT_CLINIC_OR_DEPARTMENT_OTHER): Payer: Medicare Other | Admitting: Medical Oncology

## 2022-01-26 ENCOUNTER — Inpatient Hospital Stay: Payer: Medicare Other

## 2022-01-26 ENCOUNTER — Encounter: Payer: Self-pay | Admitting: Medical Oncology

## 2022-01-26 VITALS — BP 130/80 | HR 108 | Temp 97.7°F | Wt <= 1120 oz

## 2022-01-26 DIAGNOSIS — E860 Dehydration: Secondary | ICD-10-CM | POA: Diagnosis not present

## 2022-01-26 DIAGNOSIS — K209 Esophagitis, unspecified without bleeding: Secondary | ICD-10-CM | POA: Diagnosis not present

## 2022-01-26 DIAGNOSIS — E876 Hypokalemia: Secondary | ICD-10-CM | POA: Diagnosis not present

## 2022-01-26 DIAGNOSIS — R531 Weakness: Secondary | ICD-10-CM

## 2022-01-26 DIAGNOSIS — Z5111 Encounter for antineoplastic chemotherapy: Secondary | ICD-10-CM | POA: Diagnosis not present

## 2022-01-26 LAB — BASIC METABOLIC PANEL
Anion gap: 7 (ref 5–15)
BUN: 51 mg/dL — ABNORMAL HIGH (ref 8–23)
CO2: 23 mmol/L (ref 22–32)
Calcium: 8.2 mg/dL — ABNORMAL LOW (ref 8.9–10.3)
Chloride: 108 mmol/L (ref 98–111)
Creatinine, Ser: 0.54 mg/dL (ref 0.44–1.00)
GFR, Estimated: >60 mL/min (ref >60)
Glucose, Bld: 138 mg/dL — ABNORMAL HIGH (ref 70–99)
Potassium: 4 mmol/L (ref 3.5–5.1)
Sodium: 138 mmol/L (ref 135–145)

## 2022-01-26 LAB — MAGNESIUM: Magnesium: 1.3 mg/dL — ABNORMAL LOW (ref 1.7–2.4)

## 2022-01-26 MED ORDER — ESOMEPRAZOLE MAGNESIUM 20 MG PO CPDR: 20.0000 mg | DELAYED_RELEASE_CAPSULE | Freq: Two times a day (BID) | ORAL | 0 refills | Status: DC

## 2022-01-26 MED ORDER — HEPARIN SOD (PORK) LOCK FLUSH 100 UNIT/ML IV SOLN
500.0000 [IU] | Freq: Once | INTRAVENOUS | Status: AC
  Administered 2022-01-26: 500 [IU] via INTRAVENOUS
  Filled 2022-01-26: qty 5

## 2022-01-26 MED ORDER — MAGNESIUM SULFATE 4 GM/100ML IV SOLN
4.0000 g | Freq: Once | INTRAVENOUS | Status: AC
  Administered 2022-01-26: 4 g via INTRAVENOUS
  Filled 2022-01-26: qty 100

## 2022-01-26 MED ORDER — SODIUM CHLORIDE 0.9% FLUSH
10.0000 mL | Freq: Once | INTRAVENOUS | Status: AC
  Administered 2022-01-26: 10 mL via INTRAVENOUS
  Filled 2022-01-26: qty 10

## 2022-01-26 MED ORDER — NYSTATIN 100000 UNIT/ML MT SUSP: 5.0000 mL | Freq: Four times a day (QID) | OROMUCOSAL | 1 refills | Status: DC | PRN

## 2022-01-26 NOTE — Progress Notes (Signed)
Symptom Management [place] at [facility] [phone] [phone]  Patient Care Team: Albina Billet, MD as PCP - General (Internal Medicine) Marden Noble, MD (Internal Medicine) Bary Castilla, Forest Gleason, MD (General Surgery) Telford Nab, RN as Oncology Nurse Navigator Noreene Filbert, MD as Referring Physician (Radiation Oncology) Sindy Guadeloupe, MD as Consulting Physician (Oncology)   NAME OF PATIENT: Carol Shaffer  [id number]  1945-11-22   DATE OF VISIT: 01/26/22  REASON FOR CONSULT: SAYGE BRIENZA is a 76 y.o. female with multiple medical problems including stage IV non-small cell lung cancer.  Patient is on systemic chemo and XRT.  INTERVAL HISTORY: Patient was last seen by Altha Harm on 01/15/2022 in our [facility] clinic for esophagitis following cycle 7 of weekly CarboTaxol chemotherapy/XRT. She reported esophagitis symptoms that led to decreased oral intake and subsequent dehydration and weakness. She was found to have concurrent hypokalemia and hypomagnesemia. She received IVF along with K/mag supplementation. She was started on slowmag, reduced dose dexamethasone along with oral potassium 10 MEQ daily and has been feeling much better since this time. Today she reports that she has continues her sucralfate and Protonix which she was started on originally for the esophagitis when it first started. She does still have some esophagitis especially when she eats. No N/V/D/C but symptoms are much better than they were originally.   Wt Readings from Last 3 Encounters:  01/26/22 67 lb 11.2 oz (30.7 kg)  01/15/22 65 lb 6.4 oz (29.7 kg)  01/08/22 69 lb (31.3 kg)     Denies any neurologic complaints. Denies recent fevers or illnesses. Denies any easy bleeding or bruising.  Denies urinary complaints. Patient offers no further specific complaints today.  PAST MEDICAL HISTORY: Past Medical History:  Diagnosis Date   Arthritis    hands    Avascular necrosis of hip, right ([place])    Blepharospasm    Cancer ([place]) 2011   Right Upper Lobe Lobectomy   Chronic diarrhea    Chronic diarrhea    Collagenous colitis    Complication of anesthesia    usually wakes up during procedures  (endoscopy and colonoscopy)   COPD (chronic obstructive pulmonary disease) ([place])    COVID 2022   Depression    Diverticulosis    Gastric outlet obstruction    Headache    every couple of days   Hemorrhoid    History of Crohn's disease    Hypoglycemic disorder    IDA (iron deficiency anemia)    Intestinal adhesions    Multiple gastric ulcers    Multiple gastric ulcers    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[place])    Stroke ([place]) 8 yrs ago   double vision left eye    PAST SURGICAL HISTORY:  Past Surgical History:  Procedure Laterality Date   APPENDECTOMY  1989   botox injections for blepharospasm     BREAST SURGERY     CATARACT EXTRACTION     COLON RESECTION  2005   COLONOSCOPY  10-29-2008   [name]   COLONOSCOPY WITH PROPOFOL N/A 03/05/2017   Procedure: COLONOSCOPY WITH PROPOFOL;  Surgeon: Toledo, Benay Pike, MD;  Location: [facility] ENDOSCOPY;  Service: Gastroenterology;  Laterality: N/A;   COLOSTOMY REVERSAL  2006   ESOPHAGOGASTRODUODENOSCOPY (EGD) WITH PROPOFOL N/A 07/18/2016   Procedure: ESOPHAGOGASTRODUODENOSCOPY (EGD) WITH PROPOFOL;  Surgeon: Robert Bellow, MD;  Location: [facility] ENDOSCOPY;  Service: Endoscopy;  Laterality: N/A;   ESOPHAGOGASTRODUODENOSCOPY (EGD) WITH PROPOFOL N/A 08/03/2016   Procedure:  ESOPHAGOGASTRODUODENOSCOPY (EGD) WITH PROPOFOL;  Surgeon: Robert Bellow, MD;  Location: [facility] ENDOSCOPY;  Service: Endoscopy;  Laterality: N/A;   ESOPHAGOGASTRODUODENOSCOPY (EGD) WITH PROPOFOL N/A 09/01/2018   Procedure: ESOPHAGOGASTRODUODENOSCOPY (EGD) WITH PROPOFOL;  Surgeon: Toledo, Benay Pike, MD;  Location: [facility] ENDOSCOPY;  Service: Gastroenterology;  Laterality: N/A;   ESOPHAGOGASTRODUODENOSCOPY (EGD) WITH PROPOFOL N/A 12/01/2018    Procedure: ESOPHAGOGASTRODUODENOSCOPY (EGD) WITH PROPOFOL;  Surgeon: Toledo, Benay Pike, MD;  Location: [facility] ENDOSCOPY;  Service: Gastroenterology;  Laterality: N/A;   ESOPHAGOGASTRODUODENOSCOPY (EGD) WITH PROPOFOL N/A 02/23/2019   Procedure: ESOPHAGOGASTRODUODENOSCOPY (EGD) WITH PROPOFOL;  Surgeon: Lucilla Lame, MD;  Location: [place];  Service: Endoscopy;  Laterality: N/A;   ESOPHAGOSCOPY WITH DILITATION  2012,2015   [facility] [place]   EYE SURGERY     FLEXIBLE BRONCHOSCOPY N/A 10/30/2021   Procedure: FLEXIBLE BRONCHOSCOPY;  Surgeon: Armando Reichert, MD;  Location: [facility] ORS;  Service: Pulmonary;  Laterality: N/A;   GASTRECTOMY     gastric ulcer  1989, 1991   IR IMAGING GUIDED PORT INSERTION  11/14/2021   JOINT REPLACEMENT     right total hip arthroplasty 03/03/02   LAPAROSCOPIC LYSIS OF ADHESIONS     LUNG CANCER SURGERY  2011   LYSIS OF ADHESION     PLACEMENT OF BREAST IMPLANTS  1973   thoracotomy with right upper lobectomy and central compartment node dissection     UPPER GASTROINTESTINAL ENDOSCOPY  10-29-2008   [name]   VIDEO BRONCHOSCOPY WITH ENDOBRONCHIAL ULTRASOUND N/A 10/30/2021   Procedure: VIDEO BRONCHOSCOPY WITH ENDOBRONCHIAL ULTRASOUND;  Surgeon: Armando Reichert, MD;  Location: [facility] OR[facility];  Service: Pulmonary;  Laterality: N/A;    HEMATOLOGY/ONCOLOGY HISTORY:  Oncology History  Non-small cell cancer of middle lobe of right lung ([place])  06/29/2019 Initial Diagnosis   Non-small cell cancer of middle lobe of right lung ([place])   11/03/2021 Cancer Staging   Staging form: Lung, AJCC 8th Edition - Clinical stage from 11/03/2021: Stage IV (cT1b, cN2, cM1) - Signed by Sindy Guadeloupe, MD on 11/13/2021 Histopathologic type: Adenocarcinoma, NOS   11/27/2021 -  Chemotherapy   Patient is on Treatment Plan : LUNG Carboplatin + Paclitaxel + XRT q7d       ALLERGIES:  is allergic to hydromorphone hcl, bentyl [dicyclomine], biaxin [clarithromycin], budesonide, erythromycin, and reglan  [metoclopramide].  MEDICATIONS:  Current Outpatient Medications  Medication Sig Dispense Refill   acetaminophen (TYLENOL) 500 MG tablet Take 500 mg by mouth every 6 (six) hours as needed.     brimonidine-timolol (COMBIGAN) 0.2-0.5 % ophthalmic solution INT 1 GTT IN OU BID     Cholecalciferol (VITAMIN D3) 1000 units CAPS Take 1 capsule by mouth daily.     dexamethasone (DECADRON) 2 MG tablet Take 1 tablet (2 mg total) by mouth 2 (two) times daily with a meal. 60 tablet 0   docusate (COLACE) 50 MG/5ML liquid Take by mouth daily.     fluticasone (FLONASE) 50 MCG/ACT nasal spray Place 2 sprays into both nostrils daily.     lansoprazole (PREVACID) 30 MG capsule Take 1 capsule (30 mg total) by mouth daily at 12 noon. 30 capsule 2   lidocaine-prilocaine (EMLA) cream Apply to affected area once 30 g 3   loratadine (CLARITIN) 10 MG tablet Take 10 mg by mouth daily.     magic mouthwash (nystatin, lidocaine, diphenhydrAMINE, alum & mag hydroxide) suspension Swish and swallow 5 mLs 4 (four) times daily as needed for mouth pain. 240 mL 1   magnesium chloride (SLOW-MAG) 64 MG TBEC SR tablet  Take 1 tablet (64 mg total) by mouth daily. 14 tablet 0   Multiple Vitamin (MULTI-VITAMINS) TABS Take 1 tablet by mouth daily. once daily.     NON FORMULARY Take 1 Dose by mouth daily. BONE BROTH     oxyCODONE (OXY IR/ROXICODONE) 5 MG immediate release tablet Take 2 tablets (10 mg total) by mouth 3 (three) times daily as needed for severe pain. 180 tablet 0   pantoprazole (PROTONIX) 40 MG tablet Take 40 mg by mouth 2 (two) times daily.      potassium chloride (KLOR-CON M) 10 MEQ tablet Take 1 tablet (10 mEq total) by mouth daily. 14 tablet 0   sucralfate (CARAFATE) 1 G tablet Take 1 g by mouth 4 (four) times daily.      traZODone (DESYREL) 150 MG tablet Take 300 mg by mouth at bedtime.      venlafaxine XR (EFFEXOR-XR) 150 MG 24 hr capsule Take 150 mg by mouth daily with breakfast.      ondansetron (ZOFRAN) 8 MG tablet  Take 1 tablet (8 mg total) by mouth every 8 (eight) hours as needed for nausea or vomiting. Start on the third day after chemotherapy. (Patient not taking: Reported on 01/15/2022) 30 tablet 1   prochlorperazine (COMPAZINE) 10 MG tablet Take 1 tablet (10 mg total) by mouth every 6 (six) hours as needed for nausea or vomiting. (Patient not taking: Reported on 11/27/2021) 30 tablet 1   No current facility-administered medications for this visit.   Facility-Administered Medications Ordered in Other Visits  Medication Dose Route Frequency Provider Last Rate Last Admin   heparin lock flush 100 UNIT/ML injection            heparin lock flush 100 unit/mL  500 Units Intravenous Once Race Latour M, PA-C       magnesium sulfate IVPB 4 g 100 mL  4 g Intravenous Once Hughie Closs, PA-C 50 mL/hr at 01/26/22 1012 4 g at 01/26/22 1012   sodium chloride flush (NS) 0.9 % injection 10 mL  10 mL Intracatheter PRN Sindy Guadeloupe, MD        VITAL SIGNS: BP 130/80 (BP Location: Left Arm, Patient Position: Sitting)   Pulse (!) 108   Temp 97.7 F (36.5 C) (Tympanic)   Wt 67 lb 11.2 oz (30.7 kg)   BMI 13.22 kg/m  Filed Weights   01/26/22 0920  Weight: 67 lb 11.2 oz (30.7 kg)    Estimated body mass index is 13.22 kg/m as calculated from the following:   Height as of 01/15/22: 5' (1.524 m).   Weight as of this encounter: 67 lb 11.2 oz (30.7 kg).  LABS: CBC:    Component Value Date/Time   WBC 3.3 (L) 01/19/2022 1153   HGB 8.8 (L) 01/19/2022 1153   HCT 27.4 (L) 01/19/2022 1153   PLT 259 01/19/2022 1153   MCV 95.5 01/19/2022 1153   NEUTROABS 2.2 01/19/2022 1153   LYMPHSABS 0.1 (L) 01/19/2022 1153   MONOABS 0.8 01/19/2022 1153   EOSABS 0.0 01/19/2022 1153   BASOSABS 0.0 01/19/2022 1153   Comprehensive Metabolic Panel:    Component Value Date/Time   NA 138 01/26/2022 0904   K 4.0 01/26/2022 0904   CL 108 01/26/2022 0904   CO2 23 01/26/2022 0904   BUN 51 (H) 01/26/2022 0904   CREATININE  0.54 01/26/2022 0904   CREATININE 0.88 12/24/2011 0835   GLUCOSE 138 (H) 01/26/2022 0904   CALCIUM 8.2 (L) 01/26/2022 0904   AST 20  01/19/2022 1153   ALT 26 01/19/2022 1153   ALKPHOS 62 01/19/2022 1153   BILITOT 0.4 01/19/2022 1153   PROT 5.3 (L) 01/19/2022 1153   ALBUMIN 3.1 (L) 01/19/2022 1153    RADIOGRAPHIC STUDIES: NM Bone Scan Whole Body  Result Date: 01/25/2022 CLINICAL DATA:  RIGHT upper lobe small cell lung cancer post radiation therapy EXAM: NUCLEAR MEDICINE WHOLE BODY BONE SCAN TECHNIQUE: Whole body anterior and posterior images were obtained approximately 3 hours after intravenous injection of radiopharmaceutical. RADIOPHARMACEUTICALS:  20.01 mCi Technetium-29mMDP IV COMPARISON:  None Correlation: CT chest abdomen pelvis 01/24/2022 FINDINGS: Uptake RIGHT paraspinal at high RIGHT thoracic region at T1 or T2 level; this corresponds to a lytic focus with a fracture in the RIGHT transverse process of T2 on accompanying CT. Uptake at RIGHT lateral cervical spine, shoulders, sternoclavicular joints, wrists, and LEFT hip typically degenerative. RIGHT hip prosthesis. Increased uptake in midthoracic spine at T7 corresponding to compression fracture on CT. No additional worrisome sites of tracer accumulation are identified. Oblique uptake of tracer in LEFT upper quadrant consistent with gastric localization of free pertechnetate. Otherwise expected urinary tract and soft tissue distribution of tracer. IMPRESSION: Focal increased tracer uptake RIGHT transverse process T2 corresponding to a lytic focus with a fracture on CT, consistent with pathologic fracture through a metastasis; the lytic lesion with increased FDG accumulation with seen on most recent PET-CT Uptake at compression fracture T7 vertebral body new since 09/13/2021, could represent an insufficiency fracture or pathologic fracture; no FDG localization at this site on most recent PET-CT. Free pertechnetate localization within stomach.  Electronically Signed   By: MLavonia DanaM.D.   On: 01/25/2022 15:23   CT CHEST ABDOMEN PELVIS W CONTRAST  Result Date: 01/25/2022 CLINICAL DATA:  Lung cancer. Remote right upper lobe lung cancer. 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 * Tracking Code: BO * EXAM: CT CHEST, ABDOMEN, AND PELVIS WITH CONTRAST TECHNIQUE: Multidetector CT imaging of the chest, abdomen and pelvis was performed following the standard protocol during bolus administration of intravenous contrast. RADIATION DOSE REDUCTION: This exam was performed according to the departmental dose-optimization program which includes automated exposure control, adjustment of the mA and/or kV according to patient size and/or use of iterative reconstruction technique. CONTRAST:  632mOMNIPAQUE IOHEXOL 300 MG/ML  SOLN COMPARISON:  11/08/2021 PET and CT of the chest of 09/13/2021. FINDINGS: CT CHEST FINDINGS Cardiovascular: Right Port-A-Cath tip superior caval/atrial junction. Aortic atherosclerosis. Normal heart size, without pericardial effusion. Right coronary artery calcification. No central pulmonary embolism, on this non-dedicated study. Mediastinum/Nodes: No supraclavicular adenopathy. The hypermetabolic high right paratracheal node is decreased in size. 4 mm on 22/6 today versus 9 mm on the prior diagnostic CT. Subcarinal node measures 4 mm on 26/6 versus 8 mm on the prior diagnostic CT. This was also hypermetabolic on the interval PET. No residual soft tissue thickening about the bronchus intermedius/right hilum to correspond to hypermetabolism on prior PET. No left hilar adenopathy. Lungs/Pleura: No pleural fluid. Advanced centrilobular emphysema. Right upper lobectomy. The treated right lower lobe lung lesion measures 2.8 x 2.6 cm on 59/8 versus 3.5 x 2.4 cm on 09/13/2021 when measured in a similar fashion. The left lower lobe subsolid nodule is less apparent today, with only minimal interstitial thickening  remaining on 92/8. Musculoskeletal: Bilateral calcified breast implants with suspicion of extracapsular leakage on the right. Osteopenia. No CT correlate for the right T2 transverse process hypermetabolism on prior PET. Right 7th posterolateral rib minimally  displaced fracture on 23/6 is unchanged and may relate to radiation induced insufficiency. Moderate T7 compression deformity with minimal ventral canal encroachment is new on sagittal image 65. CT ABDOMEN PELVIS FINDINGS Hepatobiliary: No suspicious liver lesion. Tiny segment 2-3 hepatic cyst. Cholecystectomy, without biliary ductal dilatation. Pancreas: Normal, without mass or ductal dilatation. Spleen: Normal in size, without focal abnormality. Adrenals/Urinary Tract: Normal adrenal glands. Bilateral, left larger than right fluid density renal lesions of up to 3.7 cm are most consistent with cysts . In the absence of clinically indicated signs/symptoms require(s) no independent follow-up. No hydronephrosis. Degraded evaluation of the pelvis, secondary to beam hardening artifact from right hip arthroplasty. Grossly normal urinary bladder. Stomach/Bowel: Surgical changes about the gastroesophageal junction and gastric body. Rectosigmoid sutures. Colonic stool burden suggests constipation. Normal small bowel. Vascular/Lymphatic: Aortic atherosclerosis. No abdominopelvic adenopathy. Reproductive: Poorly evaluated secondary to beam hardening artifact. No gross adnexal mass. Other: No significant free fluid. No evidence of omental or peritoneal disease. No free intraperitoneal air. Musculoskeletal: Right hip arthroplasty. IMPRESSION: 1. Response to therapy of mediastinal nodal metastasis and right hilar lcal recurrence centered about the bronchus intermedius. 2. Decreased size of treated right lower lobe primary. 3. No new or progressive disease. 4. No acute process or evidence of metastatic disease in the abdomen or pelvis. 5.  Possible constipation. 6. Interval T7  compression deformity, favored to be due to osteopenia. No correlate hypermetabolism on prior PET. 7. Aortic atherosclerosis (ICD10-I70.0), coronary artery atherosclerosis and emphysema (ICD10-J43.9). 8. Degraded evaluation of the pelvis, secondary to beam hardening artifact from right hip arthroplasty. Electronically Signed   By: Abigail Miyamoto M.D.   On: 01/25/2022 08:48    PERFORMANCE STATUS (ECOG) : 2 - Symptomatic, <50% confined to bed  Review of Systems Unless otherwise noted, a complete review of systems is negative.  Physical Exam General: NAD. Frail woman  Cardiovascular: regular rate and rhythm Pulmonary: clear ant fields Abdomen: soft, nontender, + bowel sounds GU: no suprapubic tenderness Extremities: no edema, no joint deformities Skin: no rashes Neurological: Weakness but otherwise nonfocal  IMPRESSION/PLAN: Encounter Diagnoses  Name Primary?   Esophagitis Yes   Hypomagnesemia    Dehydration    Hypokalemia    Weakness    Esophagitis: Somewhat improved on sucralfate and protonix but still bothersome for patient and causing some weight loss from lack of oral intake. Switching her from Protonix to nexium. Continue sucralfate. Magic mouthwash has worked well for her many years ago for esophagitis so I will send this in as well. Sending magic mouthwash to [facility] given nationwide shortage on lidocaine.   Hypomagnesemia: Acute on chronic. Magnesium 1.3. Will supplement today with 4g.   Dehydration: Secondary to poor oral intake. Continue visits with nutrition. See above. 1L IVF today  Hypokalemia: Resolved with oral supplementation which they will continue.   Weakness: Improving.   Disposition:  1L IVF fluids 4g magnesium  RTC next week [facility], labs +- fluids/mag/k   Patient expressed understanding and was in agreement with this plan. She also understands that She can call clinic at any time with any questions, concerns, or complaints.   Thank you for  allowing me to participate in the care of this very pleasant patient.   Time Total: 15 minutes  Visit consisted of counseling and education dealing with the complex and emotionally intense issues of symptom management in the setting of serious illness.Greater than 50%  of this time was spent counseling and coordinating care related to the above assessment and  plan.  Signed by: Nelwyn Salisbury PA-C

## 2022-01-26 NOTE — Patient Instructions (Signed)
Hypomagnesemia Hypomagnesemia is a condition in which the level of magnesium in the blood is too low. Magnesium is a mineral that is found in many foods. It is used in many different processes in the body. Hypomagnesemia can affect every organ in the body. In severe cases, it can cause life-threatening problems. What are the causes? This condition may be caused by: Not getting enough magnesium in your diet or not having enough healthy foods to eat (malnutrition). Problems with magnesium absorption in the intestines. Dehydration. Excessive use of alcohol. Vomiting. Severe or long-term (chronic) diarrhea. Some medicines, including medicines that make you urinate more often (diuretics). Certain diseases, such as kidney disease, diabetes, celiac disease, and overactive thyroid. What are the signs or symptoms? Symptoms of this condition include: Loss of appetite, nausea, and vomiting. Involuntary shaking or trembling of a body part (tremor). Muscle weakness or tingling in the arms and legs. Sudden tightening of muscles (muscle spasms). Confusion. Psychiatric issues, such as: Depression and irritability. Psychosis. A feeling of fluttering of the heart (palpitations). Seizures. These symptoms are more severe if magnesium levels drop suddenly. How is this diagnosed? This condition may be diagnosed based on: Your symptoms and medical history. A physical exam. Blood and urine tests. How is this treated? Treatment depends on the cause and the severity of the condition. It may be treated by: Taking a magnesium supplement. This can be taken in pill form. If the condition is severe, magnesium is usually given through an IV. Making changes to your diet. You may be directed to eat foods that have a lot of magnesium, such as green leafy vegetables, peas, beans, and nuts. Not drinking alcohol. If you are struggling not to drink, ask your health care provider for help. Follow these instructions at  home: Eating and drinking     Make sure that your diet includes foods with magnesium. Foods that have a lot of magnesium in them include: Green leafy vegetables, such as spinach and broccoli. Beans and peas. Nuts and seeds, such as almonds and sunflower seeds. Whole grains, such as whole grain bread and fortified cereals. Drink fluids that contain salts and minerals (electrolytes), such as sports drinks, when you are active. Do not drink alcohol. General instructions Take over-the-counter and prescription medicines only as told by your health care provider. Take magnesium supplements as directed if your health care provider tells you to take them. Have your magnesium levels monitored as told by your health care provider. Keep all follow-up visits. This is important. Contact a health care provider if: You get worse instead of better. Your symptoms return. Get help right away if: You develop severe muscle weakness. You have trouble breathing. You feel that your heart is racing. These symptoms may represent a serious problem that is an emergency. Do not wait to see if the symptoms will go away. Get medical help right away. Call your local emergency services (911 in the [place] Do not drive yourself to the hospital. Summary Hypomagnesemia is a condition in which the level of magnesium in the blood is too low. Hypomagnesemia can affect every organ in the body. Treatment may include eating more foods that contain magnesium, taking magnesium supplements, and not drinking alcohol. Have your magnesium levels monitored as told by your health care provider. This information is not intended to replace advice given to you by your health care provider. Make sure you discuss any questions you have with your health care provider. Document Revised: 06/14/2020 Document Reviewed: 06/14/2020 Elsevier Patient Education    2023 [employer] Inc.  

## 2022-01-30 ENCOUNTER — Inpatient Hospital Stay: Payer: Medicare Other | Admitting: Oncology

## 2022-01-30 ENCOUNTER — Inpatient Hospital Stay: Payer: Medicare Other

## 2022-01-30 ENCOUNTER — Encounter: Payer: Medicare Other | Admitting: Nurse Practitioner

## 2022-02-01 ENCOUNTER — Telehealth: Payer: Self-pay | Admitting: *Deleted

## 2022-02-01 ENCOUNTER — Other Ambulatory Visit: Payer: Self-pay

## 2022-02-01 DIAGNOSIS — C342 Malignant neoplasm of middle lobe, bronchus or lung: Secondary | ICD-10-CM

## 2022-02-01 NOTE — Telephone Encounter (Signed)
Call from [place] needing clarification on prescriptions ordered by Dr Baruch Gouty and one by Dr Hall Busing her PCP. Dr Baruch Gouty has ordered Lansoprazole 30 mg daily at noon, Dr Hall Busing has had her on Pantoprazole 40 mg twice a day and patient is trying to refill that medicine by Dr Hall Busing. Pharmacy is asking if she is to be on both drugs of similar action or not.Please return her call [phone], ask for Pharmacist and tell them it is in reference to prescription [phone]

## 2022-02-02 ENCOUNTER — Inpatient Hospital Stay: Payer: Medicare Other

## 2022-02-02 ENCOUNTER — Inpatient Hospital Stay (HOSPITAL_BASED_OUTPATIENT_CLINIC_OR_DEPARTMENT_OTHER): Payer: Medicare Other | Admitting: Oncology

## 2022-02-02 ENCOUNTER — Inpatient Hospital Stay: Payer: Medicare Other | Attending: Oncology

## 2022-02-02 ENCOUNTER — Encounter: Payer: Self-pay | Admitting: Oncology

## 2022-02-02 ENCOUNTER — Encounter: Payer: Medicare Other | Admitting: Medical Oncology

## 2022-02-02 VITALS — BP 123/77 | HR 106 | Temp 96.2°F | Resp 16 | Wt <= 1120 oz

## 2022-02-02 DIAGNOSIS — Z9884 Bariatric surgery status: Secondary | ICD-10-CM | POA: Insufficient documentation

## 2022-02-02 DIAGNOSIS — Z87891 Personal history of nicotine dependence: Secondary | ICD-10-CM | POA: Insufficient documentation

## 2022-02-02 DIAGNOSIS — C342 Malignant neoplasm of middle lobe, bronchus or lung: Secondary | ICD-10-CM

## 2022-02-02 DIAGNOSIS — Z923 Personal history of irradiation: Secondary | ICD-10-CM | POA: Diagnosis not present

## 2022-02-02 DIAGNOSIS — Z79899 Other long term (current) drug therapy: Secondary | ICD-10-CM | POA: Insufficient documentation

## 2022-02-02 DIAGNOSIS — D801 Nonfamilial hypogammaglobulinemia: Secondary | ICD-10-CM | POA: Diagnosis not present

## 2022-02-02 DIAGNOSIS — C771 Secondary and unspecified malignant neoplasm of intrathoracic lymph nodes: Secondary | ICD-10-CM | POA: Diagnosis present

## 2022-02-02 DIAGNOSIS — Z9221 Personal history of antineoplastic chemotherapy: Secondary | ICD-10-CM | POA: Insufficient documentation

## 2022-02-02 DIAGNOSIS — Z7189 Other specified counseling: Secondary | ICD-10-CM

## 2022-02-02 LAB — COMPREHENSIVE METABOLIC PANEL
ALT: 33 U/L (ref 0–44)
AST: 24 U/L (ref 15–41)
Albumin: 3.1 g/dL — ABNORMAL LOW (ref 3.5–5.0)
Alkaline Phosphatase: 59 U/L (ref 38–126)
Anion gap: 10 (ref 5–15)
BUN: 47 mg/dL — ABNORMAL HIGH (ref 8–23)
CO2: 25 mmol/L (ref 22–32)
Calcium: 8 mg/dL — ABNORMAL LOW (ref 8.9–10.3)
Chloride: 103 mmol/L (ref 98–111)
Creatinine, Ser: 0.57 mg/dL (ref 0.44–1.00)
GFR, Estimated: >60 mL/min (ref >60)
Glucose, Bld: 116 mg/dL — ABNORMAL HIGH (ref 70–99)
Potassium: 3.1 mmol/L — ABNORMAL LOW (ref 3.5–5.1)
Sodium: 138 mmol/L (ref 135–145)
Total Bilirubin: 0.5 mg/dL (ref 0.3–1.2)
Total Protein: 5.6 g/dL — ABNORMAL LOW (ref 6.5–8.1)

## 2022-02-02 LAB — CBC WITH DIFFERENTIAL/PLATELET
Abs Immature Granulocytes: 0.26 10*3/uL — ABNORMAL HIGH (ref 0.00–0.07)
Basophils Absolute: 0 10*3/uL (ref 0.0–0.1)
Basophils Relative: 0 %
Eosinophils Absolute: 0 10*3/uL (ref 0.0–0.5)
Eosinophils Relative: 0 %
HCT: 32.8 % — ABNORMAL LOW (ref 36.0–46.0)
Hemoglobin: 10.5 g/dL — ABNORMAL LOW (ref 12.0–15.0)
Immature Granulocytes: 3 %
Lymphocytes Relative: 1 %
Lymphs Abs: 0.1 10*3/uL — ABNORMAL LOW (ref 0.7–4.0)
MCH: 30.9 pg (ref 26.0–34.0)
MCHC: 32 g/dL (ref 30.0–36.0)
MCV: 96.5 fL (ref 80.0–100.0)
Monocytes Absolute: 0.7 10*3/uL (ref 0.1–1.0)
Monocytes Relative: 7 %
Neutro Abs: 9.4 10*3/uL — ABNORMAL HIGH (ref 1.7–7.7)
Neutrophils Relative %: 89 %
Platelets: 242 10*3/uL (ref 150–400)
RBC: 3.4 MIL/uL — ABNORMAL LOW (ref 3.87–5.11)
RDW: 26.7 % — ABNORMAL HIGH (ref 11.5–15.5)
WBC: 10.5 10*3/uL (ref 4.0–10.5)
nRBC: 0 % (ref 0.0–0.2)

## 2022-02-02 LAB — MAGNESIUM: Magnesium: 1.5 mg/dL — ABNORMAL LOW (ref 1.7–2.4)

## 2022-02-02 MED ORDER — POTASSIUM CHLORIDE 20 MEQ/100ML IV SOLN
20.0000 meq | Freq: Once | INTRAVENOUS | Status: AC
  Administered 2022-02-02: 20 meq via INTRAVENOUS

## 2022-02-02 MED ORDER — SODIUM CHLORIDE 0.9% FLUSH
10.0000 mL | Freq: Once | INTRAVENOUS | Status: AC
  Administered 2022-02-02: 10 mL via INTRAVENOUS
  Filled 2022-02-02: qty 10

## 2022-02-02 MED ORDER — HEPARIN SOD (PORK) LOCK FLUSH 100 UNIT/ML IV SOLN
500.0000 [IU] | Freq: Once | INTRAVENOUS | Status: AC
  Administered 2022-02-02: 500 [IU] via INTRAVENOUS
  Filled 2022-02-02: qty 5

## 2022-02-02 MED ORDER — MAGNESIUM SULFATE 2 GM/50ML IV SOLN
2.0000 g | Freq: Once | INTRAVENOUS | Status: AC
  Administered 2022-02-02: 2 g via INTRAVENOUS
  Filled 2022-02-02: qty 50

## 2022-02-02 MED ORDER — SODIUM CHLORIDE 0.9 % IV SOLN
INTRAVENOUS | Status: DC
  Filled 2022-02-02: qty 250

## 2022-02-02 NOTE — Progress Notes (Signed)
Pt in for follow up and fluids today.  Reports continued lower abdominal pain but improved with new med regime.  Pt also reports increased weakness in left lower leg.

## 2022-02-02 NOTE — Progress Notes (Signed)
Hematology/Oncology Consult note [facility]  Telephone:([phone] [phone]  Patient Care Team: Albina Billet, MD as PCP - General (Internal Medicine) Marden Noble, MD (Internal Medicine) Bary Castilla, Forest Gleason, MD (General Surgery) Telford Nab, RN as Oncology Nurse Navigator Noreene Filbert, MD as Referring Physician (Radiation Oncology) Sindy Guadeloupe, MD as Consulting Physician (Oncology)   Name of the patient: Carol Shaffer  [id number]  1945/02/18   Date of visit: 02/02/22  Diagnosis- non-small cell lung cancer involving the right middle lobe stage IV T1b N2 M1      Chief complaint/ Reason for visit-discuss CT scan results and further management  Heme/Onc history: Patient is a 77 year old female with a history oflung cancer in 2012 s/p right upper lobe resection.  Low dose chest CT on 09/04/2018 revealed multiple small pulmonary nodules are noted in the lungs bilaterally. In addition, there was a 1.53 cm concerning nodule in the right lower lobe. There was diffuse bronchial wall thickening with mild centrilobular and paraseptal emphysema; imaging findings suggestive of underlying COPD.PET scan on 09/16/2018 revealed low level FDG uptake associated with the right lower lobe lung nodule (SUV 1.57). This was a nonspecific finding. Given the size of this nodule and the low level uptake, findings may reflect inflammatory or infectious nodule. Malignancy was not entirely excluded.  Low dose chest CT on 12/04/2018 revealed a 15.3 mm RLL nodule unchanged in size.   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  There were some ground-glass attenuation components, but is predominantly solid in appearance with some internal air bronchograms, with macrolobulated and spiculated borders, clearly increased in size.   RLL  nodule CT guided biopsy on 06/17/2019 revealed a non-small cell carcinoma c/w adenocarcinoma.  Tumor was positive for CK7 and TTF-1 and negative for p40.  She is not a surgical candidate.   She received 6000 cGy SBRT to the RLL from 08/05/2019 - 08/17/2019.   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  History of iron deficiency anemia and she has undergone work-up in the past.  She has a history of gastric bypass surgery.    Patient hadCT scan of breast 2023 which showed narrowing and occlusion of the right middle lobe bronchus which was looking more prominent as compared to prior scans.  There was also concern for paratracheal adenopathy which had increased in size as compared to prior scans.  Patient was seen by pulmonary and underwent bronchoscopy guided biopsy.  Bronchial biopsies of the right middle lobe did not show any malignancy however cytology from station 7 and station 4R lymph node was consistent with adenocarcinoma.  PET scan also showed hypermetabolism in the T2 vertebral body lesion concerning for metastatic disease  Patient is being treated with potentially curative intent and has completed concurrent chemoradiation with weekly CarboTaxol and is also receiving palliative radiation to her T2 vertebral body lesion.  There was not adequate sample to perform NGS testing.  PD-L1 less than 1%.  Interval history-patient does not feel that Nexium helps her with heartburn symptoms and has gone back to taking Protonix.  She also did not tolerate prednisone thatWas given to her  ECOG PS- 2 Pain scale- 2 Opioid associated constipation- no  Review of systems- Review of Systems  Constitutional:  Positive for malaise/fatigue. Negative for chills, fever and weight loss.  HENT:  Negative for congestion, ear discharge and nosebleeds.   Eyes:  Negative for blurred vision.  Respiratory:  Negative for cough, hemoptysis, sputum production, shortness of breath and  wheezing.   Cardiovascular:  Negative for chest pain, palpitations, orthopnea and claudication.  Gastrointestinal:  Negative for abdominal pain, blood in stool, constipation, diarrhea, heartburn, melena, nausea and vomiting.  Genitourinary:  Negative for dysuria, flank pain, frequency, hematuria and urgency.  Musculoskeletal:  Negative for back pain, joint pain and myalgias.  Skin:  Negative for rash.  Neurological:  Negative for dizziness, tingling, focal weakness, seizures, weakness and headaches.  Endo/Heme/Allergies:  Does not bruise/bleed easily.  Psychiatric/Behavioral:  Negative for depression and suicidal ideas. The patient does not have insomnia.       Allergies  Allergen Reactions   Hydromorphone Hcl Itching   Bentyl [Dicyclomine] Nausea And Vomiting   Biaxin [Clarithromycin] Other (See Comments)    hallucinations   Budesonide Other (See Comments)    Ulcer   Erythromycin Nausea And Vomiting   Reglan [Metoclopramide] Other (See Comments)    tremors     Past Medical History:  Diagnosis Date   Arthritis    hands   Avascular necrosis of hip, right ([place])    Blepharospasm    Cancer ([place]) 2011   Right Upper Lobe Lobectomy   Chronic diarrhea    Chronic diarrhea    Collagenous colitis    Complication of anesthesia    usually wakes up during procedures  (endoscopy and colonoscopy)   COPD (chronic obstructive pulmonary disease) ([place])    COVID 2022   Depression    Diverticulosis    Gastric outlet obstruction    Headache    every couple of days   Hemorrhoid    History of Crohn's disease    Hypoglycemic disorder    IDA (iron deficiency anemia)    Intestinal adhesions    Multiple gastric ulcers    Multiple gastric ulcers    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[place])    Stroke ([place]) 8 yrs ago   double vision left eye     Past Surgical History:  Procedure Laterality Date   APPENDECTOMY  1989   botox injections for blepharospasm     BREAST SURGERY      CATARACT EXTRACTION     COLON RESECTION  2005   COLONOSCOPY  10-29-2008   [name]   COLONOSCOPY WITH PROPOFOL N/A 03/05/2017   Procedure: COLONOSCOPY WITH PROPOFOL;  Surgeon: Toledo, Benay Pike, MD;  Location: [facility] END[facility];  Service: Gastroenterology;  Laterality: N/A;   COLOSTOMY REVERSAL  2006   ESOPHAGOGASTRODUODENOSCOPY (EGD) WITH PROPOFOL N/A 07/18/2016   Procedure: ESOPHAGOGASTRODUODENOSCOPY (EGD) WITH PROPOFOL;  Surgeon: Robert Bellow, MD;  Location: [facility] END[facility];  Service: Endoscopy;  Laterality: N/A;   ESOPHAGOGASTRODUODENOSCOPY (EGD) WITH PROPOFOL N/A 08/03/2016   Procedure: ESOPHAGOGASTRODUODENOSCOPY (EGD) WITH PROPOFOL;  Surgeon: Robert Bellow, MD;  Location: [facility] END[facility];  Service: Endoscopy;  Laterality: N/A;   ESOPHAGOGASTRODUODENOSCOPY (EGD) WITH PROPOFOL N/A 09/01/2018   Procedure: ESOPHAGOGASTRODUODENOSCOPY (EGD) WITH PROPOFOL;  Surgeon: Toledo, Benay Pike, MD;  Location: [facility];  Service: Gastroenterology;  Laterality: N/A;   ESOPHAGOGASTRODUODENOSCOPY (EGD) WITH PROPOFOL N/A 12/01/2018   Procedure: ESOPHAGOGASTRODUODENOSCOPY (EGD) WITH PROPOFOL;  Surgeon: Toledo, Benay Pike, MD;  Location: [facility] END[facility];  Service: Gastroenterology;  Laterality: N/A;   ESOPHAGOGASTRODUODENOSCOPY (EGD) WITH PROPOFOL N/A 02/23/2019   Procedure: ESOPHAGOGASTRODUODENOSCOPY (EGD) WITH PROPOFOL;  Surgeon: Lucilla Lame, MD;  Location: [place];  Service: Endoscopy;  Laterality: N/A;   ESOPHAGOSCOPY WITH  DILITATION  [id number]   Elodia Florence   EYE SURGERY     FLEXIBLE BRONCHOSCOPY N/A 10/30/2021   Procedure: FLEXIBLE BRONCHOSCOPY;  Surgeon: Armando Reichert, MD;  Location: [facility];  Service: Pulmonary;  Laterality: N/A;   GASTRECTOMY     gastric ulcer  1989, 1991   IR IMAGING GUIDED PORT INSERTION  11/14/2021   JOINT REPLACEMENT     right total hip arthroplasty 03/03/02   LAPAROSCOPIC LYSIS OF ADHESIONS     LUNG CANCER SURGERY  2011   LYSIS OF ADHESION     PLACEMENT OF  BREAST IMPLANTS  1973   thoracotomy with right upper lobectomy and central compartment node dissection     UPPER GASTROINTESTINAL ENDOSCOPY  10-29-2008   [name]   VIDEO BRONCHOSCOPY WITH ENDOBRONCHIAL ULTRASOUND N/A 10/30/2021   Procedure: VIDEO BRONCHOSCOPY WITH ENDOBRONCHIAL ULTRASOUND;  Surgeon: Armando Reichert, MD;  Location: [facility];  Service: Pulmonary;  Laterality: N/A;    Social History   Socioeconomic History   Marital status: Widowed    Spouse name: Not on file   Number of children: 2   Years of education: Not on file   Highest education level: Not on file  Occupational History   Occupation: retired    Comment: Pharmacist, hospital  Tobacco Use   Smoking status: Former    Packs/day: 1.00    Years: 25.00    Total pack years: 25.00    Types: Cigarettes    Quit date: 01/30/2008    Years since quitting: 14.0   Smokeless tobacco: Never  Vaping Use   Vaping Use: Never used  Substance and Sexual Activity   Alcohol use: Yes    Comment: occ. for holiday   Drug use: No   Sexual activity: Not Currently  Other Topics Concern   Not on file  Social History Narrative   Lives alone    Social Determinants of Health   Financial Resource Strain: Not on file  Food Insecurity: Not on file  Transportation Needs: Unmet Transportation Needs (02/02/2022)   PRAPARE - Hydrologist (Medical): Yes    Lack of Transportation (Non-Medical): Yes  Physical Activity: Not on file  Stress: Not on file  Social Connections: Not on file  Intimate Partner Violence: Not on file    History reviewed. No pertinent family history.   Current Outpatient Medications:    acetaminophen (TYLENOL) 500 MG tablet, Take 500 mg by mouth every 6 (six) hours as needed., Disp: , Rfl:    brimonidine-timolol (COMBIGAN) 0.2-0.5 % ophthalmic solution, INT 1 GTT IN OU BID, Disp: , Rfl:    Cholecalciferol (VITAMIN D3) 1000 units CAPS, Take 1 capsule by mouth daily., Disp: , Rfl:    dexamethasone  (DECADRON) 2 MG tablet, Take 1 tablet (2 mg total) by mouth 2 (two) times daily with a meal., Disp: 60 tablet, Rfl: 0   docusate (COLACE) 50 MG/5ML liquid, Take by mouth daily., Disp: , Rfl:    fluticasone (FLONASE) 50 MCG/ACT nasal spray, Place 2 sprays into both nostrils daily., Disp: , Rfl:    lansoprazole (PREVACID) 30 MG capsule, Take 1 capsule (30 mg total) by mouth daily at 12 noon., Disp: 30 capsule, Rfl: 2   loratadine (CLARITIN) 10 MG tablet, Take 10 mg by mouth daily., Disp: , Rfl:    magic mouthwash (nystatin, lidocaine, diphenhydrAMINE, alum & mag hydroxide) suspension, Swish and swallow 5 mLs 4 (four) times daily as needed for mouth pain., Disp: 240 mL, Rfl: 1  magnesium chloride (SLOW-MAG) 64 MG TBEC SR tablet, Take 1 tablet (64 mg total) by mouth daily., Disp: 14 tablet, Rfl: 0   Multiple Vitamin (MULTI-VITAMINS) TABS, Take 1 tablet by mouth daily. once daily., Disp: , Rfl:    NON FORMULARY, Take 1 Dose by mouth daily. BONE BROTH, Disp: , Rfl:    pantoprazole (PROTONIX) 40 MG tablet, Take 40 mg by mouth 2 (two) times daily., Disp: , Rfl:    potassium chloride (KLOR-CON M) 10 MEQ tablet, Take 1 tablet (10 mEq total) by mouth daily., Disp: 14 tablet, Rfl: 0   sucralfate (CARAFATE) 1 G tablet, Take 1 g by mouth 4 (four) times daily. , Disp: , Rfl:    traZODone (DESYREL) 150 MG tablet, Take 300 mg by mouth at bedtime. , Disp: , Rfl:    venlafaxine XR (EFFEXOR-XR) 150 MG 24 hr capsule, Take 150 mg by mouth daily with breakfast. , Disp: , Rfl:    oxyCODONE (OXY IR/ROXICODONE) 5 MG immediate release tablet, Take 2 tablets (10 mg total) by mouth 3 (three) times daily as needed for severe pain. (Patient not taking: Reported on 02/02/2022), Disp: 180 tablet, Rfl: 0 No current facility-administered medications for this visit.  Facility-Administered Medications Ordered in Other Visits:    0.9 %  sodium chloride infusion, , Intravenous, Continuous, Sindy Guadeloupe, MD, Stopped at 02/02/22 1019    heparin lock flush 100 UNIT/ML injection, , , ,   Physical exam:  Vitals:   02/02/22 0941  BP: 123/77  Pulse: (!) 106  Resp: 16  Temp: (!) 96.2 F (35.7 C)  TempSrc: Tympanic  SpO2: 96%  Weight: 66 lb (29.9 kg)   Physical Exam Constitutional:      Comments: Patient is thin and frail appearing.  She is sitting in a wheelchair.  Appears in no acute distress  Cardiovascular:     Rate and Rhythm: Normal rate and regular rhythm.     Heart sounds: Normal heart sounds.  Pulmonary:     Effort: Pulmonary effort is normal.     Breath sounds: Normal breath sounds.  Abdominal:     General: Bowel sounds are normal.     Palpations: Abdomen is soft.  Skin:    General: Skin is warm and dry.  Neurological:     Mental Status: She is alert and oriented to person, place, and time.         Latest Ref Rng & Units 02/02/2022    8:57 AM  CMP  Glucose 70 - 99 mg/dL 116   BUN 8 - 23 mg/dL 47   Creatinine 0.44 - 1.00 mg/dL 0.57   Sodium 135 - 145 mmol/L 138   Potassium 3.5 - 5.1 mmol/L 3.1   Chloride 98 - 111 mmol/L 103   CO2 22 - 32 mmol/L 25   Calcium 8.9 - 10.3 mg/dL 8.0   Total Protein 6.5 - 8.1 g/dL 5.6   Total Bilirubin 0.3 - 1.2 mg/dL 0.5   Alkaline Phos 38 - 126 U/L 59   AST 15 - 41 U/L 24   ALT 0 - 44 U/L 33       Latest Ref Rng & Units 02/02/2022    8:57 AM  CBC  WBC 4.0 - 10.5 K/uL 10.5   Hemoglobin 12.0 - 15.0 g/dL 10.5   Hematocrit 36.0 - 46.0 % 32.8   Platelets 150 - 400 K/uL 242       NM Bone Scan Whole Body  Result Date: 01/25/2022 CLINICAL DATA:  RIGHT upper lobe small cell lung cancer post radiation therapy EXAM: NUCLEAR MEDICINE WHOLE BODY BONE SCAN TECHNIQUE: Whole body anterior and posterior images were obtained approximately 3 hours after intravenous injection of radiopharmaceutical. RADIOPHARMACEUTICALS:  20.01 mCi Technetium-12mMDP IV COMPARISON:  None Correlation: CT chest abdomen pelvis 01/24/2022 FINDINGS: Uptake RIGHT paraspinal at high RIGHT thoracic  region at T1 or T2 level; this corresponds to a lytic focus with a fracture in the RIGHT transverse process of T2 on accompanying CT. Uptake at RIGHT lateral cervical spine, shoulders, sternoclavicular joints, wrists, and LEFT hip typically degenerative. RIGHT hip prosthesis. Increased uptake in midthoracic spine at T7 corresponding to compression fracture on CT. No additional worrisome sites of tracer accumulation are identified. Oblique uptake of tracer in LEFT upper quadrant consistent with gastric localization of free pertechnetate. Otherwise expected urinary tract and soft tissue distribution of tracer. IMPRESSION: Focal increased tracer uptake RIGHT transverse process T2 corresponding to a lytic focus with a fracture on CT, consistent with pathologic fracture through a metastasis; the lytic lesion with increased FDG accumulation with seen on most recent PET-CT Uptake at compression fracture T7 vertebral body new since 09/13/2021, could represent an insufficiency fracture or pathologic fracture; no FDG localization at this site on most recent PET-CT. Free pertechnetate localization within stomach. Electronically Signed   By: MLavonia DanaM.D.   On: 01/25/2022 15:23   CT CHEST ABDOMEN PELVIS W CONTRAST  Result Date: 01/25/2022 CLINICAL DATA:  Lung cancer. Remote right upper lobe lung cancer. 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 * Tracking Code: BO * EXAM: CT CHEST, ABDOMEN, AND PELVIS WITH CONTRAST TECHNIQUE: Multidetector CT imaging of the chest, abdomen and pelvis was performed following the standard protocol during bolus administration of intravenous contrast. RADIATION DOSE REDUCTION: This exam was performed according to the departmental dose-optimization program which includes automated exposure control, adjustment of the mA and/or kV according to patient size and/or use of iterative reconstruction technique. CONTRAST:  665mOMNIPAQUE IOHEXOL 300 MG/ML  SOLN  COMPARISON:  11/08/2021 PET and CT of the chest of 09/13/2021. FINDINGS: CT CHEST FINDINGS Cardiovascular: Right Port-A-Cath tip superior caval/atrial junction. Aortic atherosclerosis. Normal heart size, without pericardial effusion. Right coronary artery calcification. No central pulmonary embolism, on this non-dedicated study. Mediastinum/Nodes: No supraclavicular adenopathy. The hypermetabolic high right paratracheal node is decreased in size. 4 mm on 22/6 today versus 9 mm on the prior diagnostic CT. Subcarinal node measures 4 mm on 26/6 versus 8 mm on the prior diagnostic CT. This was also hypermetabolic on the interval PET. No residual soft tissue thickening about the bronchus intermedius/right hilum to correspond to hypermetabolism on prior PET. No left hilar adenopathy. Lungs/Pleura: No pleural fluid. Advanced centrilobular emphysema. Right upper lobectomy. The treated right lower lobe lung lesion measures 2.8 x 2.6 cm on 59/8 versus 3.5 x 2.4 cm on 09/13/2021 when measured in a similar fashion. The left lower lobe subsolid nodule is less apparent today, with only minimal interstitial thickening remaining on 92/8. Musculoskeletal: Bilateral calcified breast implants with suspicion of extracapsular leakage on the right. Osteopenia. No CT correlate for the right T2 transverse process hypermetabolism on prior PET. Right 7th posterolateral rib minimally displaced fracture on 23/6 is unchanged and may relate to radiation induced insufficiency. Moderate T7 compression deformity with minimal ventral canal encroachment is new on sagittal image 65. CT ABDOMEN PELVIS FINDINGS Hepatobiliary: No suspicious liver lesion. Tiny segment 2-3 hepatic cyst. Cholecystectomy, without biliary ductal dilatation. Pancreas: Normal, without mass  or ductal dilatation. Spleen: Normal in size, without focal abnormality. Adrenals/Urinary Tract: Normal adrenal glands. Bilateral, left larger than right fluid density renal lesions of up to  3.7 cm are most consistent with cysts . In the absence of clinically indicated signs/symptoms require(s) no independent follow-up. No hydronephrosis. Degraded evaluation of the pelvis, secondary to beam hardening artifact from right hip arthroplasty. Grossly normal urinary bladder. Stomach/Bowel: Surgical changes about the gastroesophageal junction and gastric body. Rectosigmoid sutures. Colonic stool burden suggests constipation. Normal small bowel. Vascular/Lymphatic: Aortic atherosclerosis. No abdominopelvic adenopathy. Reproductive: Poorly evaluated secondary to beam hardening artifact. No gross adnexal mass. Other: No significant free fluid. No evidence of omental or peritoneal disease. No free intraperitoneal air. Musculoskeletal: Right hip arthroplasty. IMPRESSION: 1. Response to therapy of mediastinal nodal metastasis and right hilar lcal recurrence centered about the bronchus intermedius. 2. Decreased size of treated right lower lobe primary. 3. No new or progressive disease. 4. No acute process or evidence of metastatic disease in the abdomen or pelvis. 5.  Possible constipation. 6. Interval T7 compression deformity, favored to be due to osteopenia. No correlate hypermetabolism on prior PET. 7. Aortic atherosclerosis (ICD10-I70.0), coronary artery atherosclerosis and emphysema (ICD10-J43.9). 8. Degraded evaluation of the pelvis, secondary to beam hardening artifact from right hip arthroplasty. Electronically Signed   By: Abigail Miyamoto M.D.   On: 01/25/2022 08:48     Assessment and plan- Patient is a 77 y.o. female with history of non-small cell lung cancer involving the right middle lobe stage IV T1b N2 M1.  She is s/p concurrent chemo radiation with weekly CarboTaxol and is here to discuss CT scan results and further management  I have reviewed CT chest abdomen pelvis imageIs independently and discussed findings with the patient which does not show any evidence of further progressive disease.  The  right lower lobe lung lesion as well as mediastinal adenopathy appears improved.  There is no CT correlate for the T2 transverse process which was hypermetabolic on PET scan.  Patient as such has possible oligometastatic disease to her T2 spine which has not been biopsy-proven as it was not amenable easily for biopsy.  She has received SBRT to this lesion.  As such I am treating her with a curative intent like a stage III lung cancer.  She has completed concurrent chemoradiation with CarboTaxol and moving onto maintenance durvalumab.  Discussed risks and benefits of durvalumab including all but not limited to autoimmune side effect such as colitis pneumonitis thyroiditis hepatitis and other endocrinopathies.  Treatment will be given with a palliative intent.  NGS testing could not be completed due to inadequate sample.  PD-L1 was less than 1%.  Overall survival benefit was not seen in those subset of patients who received durvalumab but it has been approved for an stage III lung cancer in all patients regardless of PD-L1 status.  I will have a low threshold to stop durvalumab should she develop any toxicities.  I will consider doing a renal biopsy or liquid biopsy should she have progression on this regimen.   Visit Diagnosis 1. Non-small cell cancer of middle lobe of right lung ([place])   2. Goals of care, counseling/discussion      Dr. Randa Evens, MD, MPH [facility] at [facility] [phone] 02/02/2022 12:40 PM

## 2022-02-02 NOTE — Progress Notes (Signed)
OFF PATHWAY REGIMEN - Non-Small Cell Lung  No Change  Continue With Treatment as Ordered.  Original Decision Date/Time: 11/13/2021 12:46   [id number]:Carboplatin AUC 2 + Paclitaxel 50 mg/m2 weekly + RT:   Administer weekly during RT:     Paclitaxel      Carboplatin   **Always confirm dose/schedule in your pharmacy ordering system**  Patient Characteristics: Stage IV Metastatic, Nonsquamous, Awaiting Molecular Test Results and Need to Start Chemotherapy, PS = 0, 1 Therapeutic Status: Stage IV Metastatic Histology: Nonsquamous Cell Broad Molecular Profiling Status: Awaiting Molecular Test Results and Need to Start Chemotherapy ECOG Performance Status: 1 Intent of Therapy: Non-Curative / Palliative Intent, Discussed with Patient

## 2022-02-02 NOTE — Progress Notes (Signed)
DISCONTINUE OFF PATHWAY REGIMEN - Non-Small Cell Lung   [id number]:Carboplatin AUC 2 + Paclitaxel 50 mg/m2 weekly + RT:   Administer weekly during RT:     Paclitaxel      Carboplatin   **Always confirm dose/schedule in your pharmacy ordering system**  REASON: Other Reason PRIOR TREATMENT: Off Pathway: Carboplatin AUC 2 + Paclitaxel 50 mg/m2 weekly + RT TREATMENT RESPONSE: Partial Response (PR)  START OFF PATHWAY REGIMEN - Other   [id number]:Durvalumab 1,500 mg IV D1 q28 Days:   A cycle is every 28 days:     Durvalumab   **Always confirm dose/schedule in your pharmacy ordering system**  Patient Characteristics: Intent of Therapy: Non-Curative / Palliative Intent, Discussed with Patient

## 2022-02-06 ENCOUNTER — Encounter: Payer: Self-pay | Admitting: Oncology

## 2022-02-06 ENCOUNTER — Inpatient Hospital Stay: Payer: Medicare Other

## 2022-02-06 ENCOUNTER — Ambulatory Visit: Payer: Medicare Other

## 2022-02-07 ENCOUNTER — Inpatient Hospital Stay: Payer: Medicare Other

## 2022-02-07 ENCOUNTER — Ambulatory Visit
Admission: RE | Admit: 2022-02-07 | Discharge: 2022-02-07 | Disposition: A | Discharge status: Home / Self Care | Payer: Medicare Other | Source: Ambulatory Visit | Attending: Radiation Oncology | Admitting: Radiation Oncology

## 2022-02-07 VITALS — BP 155/83 | HR 116 | Temp 97.5°F | Ht 60.0 in | Wt <= 1120 oz

## 2022-02-07 DIAGNOSIS — Z5111 Encounter for antineoplastic chemotherapy: Secondary | ICD-10-CM | POA: Diagnosis present

## 2022-02-07 DIAGNOSIS — C342 Malignant neoplasm of middle lobe, bronchus or lung: Secondary | ICD-10-CM

## 2022-02-07 DIAGNOSIS — Z510 Encounter for antineoplastic radiation therapy: Secondary | ICD-10-CM | POA: Diagnosis present

## 2022-02-07 DIAGNOSIS — Z79899 Other long term (current) drug therapy: Secondary | ICD-10-CM | POA: Insufficient documentation

## 2022-02-07 DIAGNOSIS — Z902 Acquired absence of lung [part of]: Secondary | ICD-10-CM | POA: Insufficient documentation

## 2022-02-07 DIAGNOSIS — Z87891 Personal history of nicotine dependence: Secondary | ICD-10-CM | POA: Insufficient documentation

## 2022-02-07 DIAGNOSIS — C3431 Malignant neoplasm of lower lobe, right bronchus or lung: Secondary | ICD-10-CM | POA: Insufficient documentation

## 2022-02-07 NOTE — Progress Notes (Signed)
Radiation Oncology Follow up Note  Name: Carol Shaffer   Date:   02/07/2022 MRN:  [id number] DOB: Feb 08, 1945    This 77 y.o. 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  Clinically she is doing well.  She specifically denies cough hemoptysis or chest tightness.  Does have a whitish discharge from her left I am going to start her on some antibiotic ophthalmic drops..  On her last PET scan she did have hypermetabolic activity in the T2 transverse process which was the only site of disease.  At this time I am planning SBRT to that lesion.  She is fairly asymptomatic.  She had a recent CT scan end of December showing response to therapy with mediastinal nodal metastasis and right hilar recurrence improved also decrease size of treated right lower primary no new or progressive disease.  COMPLICATIONS OF TREATMENT: none  FOLLOW UP COMPLIANCE: keeps appointments   PHYSICAL EXAM:  BP (!) 155/83   Pulse (!) 116   Temp (!) 97.5 F (36.4 C)   Ht 5' (1.524 m)   Wt 67 lb 6.4 oz (30.6 kg)   BMI 13.16 kg/m  Well-developed well-nourished patient in NAD. HEENT reveals PERLA, EOMI, discs not visualized.  Oral cavity is clear. No oral mucosal lesions are identified. Neck is clear without evidence of cervical or supraclavicular adenopathy. Lungs are clear to A&P. Cardiac examination is essentially unremarkable with regular rate and rhythm without murmur rub or thrill. Abdomen is benign with no organomegaly or masses noted. Motor sensory and DTR levels are equal and symmetric in the upper and  lower extremities. Cranial nerves II through XII are grossly intact. Proprioception is intact. No peripheral adenopathy or edema is identified. No motor or sensory levels are noted. Crude visual fields are within normal range.  RADIOLOGY RESULTS: PET CT scan reviewed compatible with above-stated findings  PLAN: Present time elected ahead with SBRT to her T2 transverse process.  Would plan on delivering 30 Gray in 5 fractions.  Low side effect profile of SBRT was reviewed with the patient.  She is being simulated today.  Patient comprehends my recommendations well.  I would like to take this opportunity to thank you for allowing me to participate in the care of your patient.Noreene Filbert, MD

## 2022-02-08 ENCOUNTER — Other Ambulatory Visit: Payer: Self-pay | Admitting: *Deleted

## 2022-02-08 MED ORDER — ERYTHROMYCIN 5 MG/GM OP OINT: 1.0000 | TOPICAL_OINTMENT | Freq: Every day | OPHTHALMIC | 0 refills | Status: DC

## 2022-02-12 ENCOUNTER — Emergency Department: Payer: Medicare Other

## 2022-02-12 ENCOUNTER — Inpatient Hospital Stay
Admission: EM | Admit: 2022-02-12 | Discharge: 2022-02-20 | DRG: 871 | Disposition: A | Discharge status: Other Acute Inpatient Hospital | Payer: Medicare Other | Attending: Internal Medicine | Admitting: Internal Medicine

## 2022-02-12 ENCOUNTER — Inpatient Hospital Stay (HOSPITAL_BASED_OUTPATIENT_CLINIC_OR_DEPARTMENT_OTHER): Payer: Medicare Other | Admitting: Nurse Practitioner

## 2022-02-12 ENCOUNTER — Inpatient Hospital Stay: Payer: Medicare Other

## 2022-02-12 DIAGNOSIS — R000 Tachycardia, unspecified: Secondary | ICD-10-CM | POA: Diagnosis present

## 2022-02-12 DIAGNOSIS — R0602 Shortness of breath: Principal | ICD-10-CM

## 2022-02-12 DIAGNOSIS — J9601 Acute respiratory failure with hypoxia: Secondary | ICD-10-CM | POA: Diagnosis present

## 2022-02-12 DIAGNOSIS — Z66 Do not resuscitate: Secondary | ICD-10-CM | POA: Diagnosis present

## 2022-02-12 DIAGNOSIS — K5900 Constipation, unspecified: Secondary | ICD-10-CM | POA: Diagnosis present

## 2022-02-12 DIAGNOSIS — B9620 Unspecified Escherichia coli [E. coli] as the cause of diseases classified elsewhere: Secondary | ICD-10-CM | POA: Diagnosis not present

## 2022-02-12 DIAGNOSIS — Z85118 Personal history of other malignant neoplasm of bronchus and lung: Secondary | ICD-10-CM

## 2022-02-12 DIAGNOSIS — K259 Gastric ulcer, unspecified as acute or chronic, without hemorrhage or perforation: Secondary | ICD-10-CM | POA: Diagnosis present

## 2022-02-12 DIAGNOSIS — E43 Unspecified severe protein-calorie malnutrition: Secondary | ICD-10-CM | POA: Diagnosis present

## 2022-02-12 DIAGNOSIS — Z923 Personal history of irradiation: Secondary | ICD-10-CM

## 2022-02-12 DIAGNOSIS — R9431 Abnormal electrocardiogram [ECG] [EKG]: Secondary | ICD-10-CM

## 2022-02-12 DIAGNOSIS — Z681 Body mass index (BMI) 19 or less, adult: Secondary | ICD-10-CM | POA: Diagnosis not present

## 2022-02-12 DIAGNOSIS — R64 Cachexia: Secondary | ICD-10-CM | POA: Diagnosis present

## 2022-02-12 DIAGNOSIS — Z8249 Family history of ischemic heart disease and other diseases of the circulatory system: Secondary | ICD-10-CM | POA: Diagnosis not present

## 2022-02-12 DIAGNOSIS — Z96641 Presence of right artificial hip joint: Secondary | ICD-10-CM | POA: Diagnosis present

## 2022-02-12 DIAGNOSIS — E876 Hypokalemia: Secondary | ICD-10-CM | POA: Diagnosis present

## 2022-02-12 DIAGNOSIS — C342 Malignant neoplasm of middle lobe, bronchus or lung: Secondary | ICD-10-CM | POA: Diagnosis present

## 2022-02-12 DIAGNOSIS — J440 Chronic obstructive pulmonary disease with acute lower respiratory infection: Secondary | ICD-10-CM | POA: Diagnosis present

## 2022-02-12 DIAGNOSIS — J439 Emphysema, unspecified: Secondary | ICD-10-CM | POA: Diagnosis present

## 2022-02-12 DIAGNOSIS — I700 Atherosclerosis of aorta: Secondary | ICD-10-CM | POA: Diagnosis present

## 2022-02-12 DIAGNOSIS — Z1152 Encounter for screening for COVID-19: Secondary | ICD-10-CM | POA: Diagnosis not present

## 2022-02-12 DIAGNOSIS — D508 Other iron deficiency anemias: Secondary | ICD-10-CM | POA: Diagnosis not present

## 2022-02-12 DIAGNOSIS — F32A Depression, unspecified: Secondary | ICD-10-CM | POA: Diagnosis present

## 2022-02-12 DIAGNOSIS — R4182 Altered mental status, unspecified: Secondary | ICD-10-CM

## 2022-02-12 DIAGNOSIS — Z8616 Personal history of COVID-19: Secondary | ICD-10-CM

## 2022-02-12 DIAGNOSIS — Y95 Nosocomial condition: Secondary | ICD-10-CM | POA: Diagnosis present

## 2022-02-12 DIAGNOSIS — Z87891 Personal history of nicotine dependence: Secondary | ICD-10-CM

## 2022-02-12 DIAGNOSIS — Z9882 Breast implant status: Secondary | ICD-10-CM

## 2022-02-12 DIAGNOSIS — D509 Iron deficiency anemia, unspecified: Secondary | ICD-10-CM | POA: Diagnosis present

## 2022-02-12 DIAGNOSIS — E872 Acidosis, unspecified: Secondary | ICD-10-CM | POA: Diagnosis present

## 2022-02-12 DIAGNOSIS — J449 Chronic obstructive pulmonary disease, unspecified: Secondary | ICD-10-CM | POA: Diagnosis not present

## 2022-02-12 DIAGNOSIS — R54 Age-related physical debility: Secondary | ICD-10-CM | POA: Diagnosis present

## 2022-02-12 DIAGNOSIS — A419 Sepsis, unspecified organism: Principal | ICD-10-CM | POA: Diagnosis present

## 2022-02-12 DIAGNOSIS — R6520 Severe sepsis without septic shock: Secondary | ICD-10-CM | POA: Diagnosis present

## 2022-02-12 DIAGNOSIS — R509 Fever, unspecified: Secondary | ICD-10-CM

## 2022-02-12 DIAGNOSIS — Z833 Family history of diabetes mellitus: Secondary | ICD-10-CM

## 2022-02-12 DIAGNOSIS — Z7189 Other specified counseling: Secondary | ICD-10-CM | POA: Diagnosis not present

## 2022-02-12 DIAGNOSIS — N390 Urinary tract infection, site not specified: Secondary | ICD-10-CM | POA: Diagnosis present

## 2022-02-12 DIAGNOSIS — J189 Pneumonia, unspecified organism: Secondary | ICD-10-CM | POA: Diagnosis present

## 2022-02-12 DIAGNOSIS — Z5320 Procedure and treatment not carried out because of patient's decision for unspecified reasons: Secondary | ICD-10-CM | POA: Diagnosis present

## 2022-02-12 DIAGNOSIS — Z9221 Personal history of antineoplastic chemotherapy: Secondary | ICD-10-CM

## 2022-02-12 DIAGNOSIS — Z8673 Personal history of transient ischemic attack (TIA), and cerebral infarction without residual deficits: Secondary | ICD-10-CM

## 2022-02-12 LAB — COMPREHENSIVE METABOLIC PANEL
ALT: 32 U/L (ref 0–44)
ALT: 32 U/L (ref 0–44)
AST: 35 U/L (ref 15–41)
AST: 36 U/L (ref 15–41)
Albumin: 2.9 g/dL — ABNORMAL LOW (ref 3.5–5.0)
Albumin: 2.8 g/dL — ABNORMAL LOW (ref 3.5–5.0)
Alkaline Phosphatase: 64 U/L (ref 38–126)
Alkaline Phosphatase: 54 U/L (ref 38–126)
Anion gap: 16 — ABNORMAL HIGH (ref 5–15)
Anion gap: 12 (ref 5–15)
BUN: 41 mg/dL — ABNORMAL HIGH (ref 8–23)
BUN: 51 mg/dL — ABNORMAL HIGH (ref 8–23)
CO2: 22 mmol/L (ref 22–32)
CO2: 21 mmol/L — ABNORMAL LOW (ref 22–32)
Calcium: 8.6 mg/dL — ABNORMAL LOW (ref 8.9–10.3)
Calcium: 8.7 mg/dL — ABNORMAL LOW (ref 8.9–10.3)
Chloride: 99 mmol/L (ref 98–111)
Chloride: 104 mmol/L (ref 98–111)
Creatinine, Ser: 0.84 mg/dL (ref 0.44–1.00)
Creatinine, Ser: 0.74 mg/dL (ref 0.44–1.00)
GFR, Estimated: >60 mL/min (ref >60)
GFR, Estimated: >60 mL/min (ref >60)
Glucose, Bld: 147 mg/dL — ABNORMAL HIGH (ref 70–99)
Glucose, Bld: 200 mg/dL — ABNORMAL HIGH (ref 70–99)
Potassium: 2.9 mmol/L — ABNORMAL LOW (ref 3.5–5.1)
Potassium: 3 mmol/L — ABNORMAL LOW (ref 3.5–5.1)
Sodium: 137 mmol/L (ref 135–145)
Sodium: 137 mmol/L (ref 135–145)
Total Bilirubin: 0.5 mg/dL (ref 0.3–1.2)
Total Bilirubin: 0.8 mg/dL (ref 0.3–1.2)
Total Protein: 5.7 g/dL — ABNORMAL LOW (ref 6.5–8.1)
Total Protein: 5.9 g/dL — ABNORMAL LOW (ref 6.5–8.1)

## 2022-02-12 LAB — CBC WITH DIFFERENTIAL/PLATELET
Abs Immature Granulocytes: 0.14 10*3/uL — ABNORMAL HIGH (ref 0.00–0.07)
Abs Immature Granulocytes: 0.09 10*3/uL — ABNORMAL HIGH (ref 0.00–0.07)
Basophils Absolute: 0 10*3/uL (ref 0.0–0.1)
Basophils Absolute: 0 10*3/uL (ref 0.0–0.1)
Basophils Relative: 0 %
Basophils Relative: 0 %
Eosinophils Absolute: 0 10*3/uL (ref 0.0–0.5)
Eosinophils Absolute: 0 10*3/uL (ref 0.0–0.5)
Eosinophils Relative: 0 %
Eosinophils Relative: 0 %
HCT: 34.6 % — ABNORMAL LOW (ref 36.0–46.0)
HCT: 33.7 % — ABNORMAL LOW (ref 36.0–46.0)
Hemoglobin: 11.1 g/dL — ABNORMAL LOW (ref 12.0–15.0)
Hemoglobin: 10.7 g/dL — ABNORMAL LOW (ref 12.0–15.0)
Immature Granulocytes: 1 %
Immature Granulocytes: 1 %
Lymphocytes Relative: 1 %
Lymphocytes Relative: 1 %
Lymphs Abs: 0.1 10*3/uL — ABNORMAL LOW (ref 0.7–4.0)
Lymphs Abs: 0.2 10*3/uL — ABNORMAL LOW (ref 0.7–4.0)
MCH: 31.4 pg (ref 26.0–34.0)
MCH: 31.2 pg (ref 26.0–34.0)
MCHC: 32.1 g/dL (ref 30.0–36.0)
MCHC: 31.8 g/dL (ref 30.0–36.0)
MCV: 98 fL (ref 80.0–100.0)
MCV: 98.3 fL (ref 80.0–100.0)
Monocytes Absolute: 0.7 10*3/uL (ref 0.1–1.0)
Monocytes Absolute: 0.7 10*3/uL (ref 0.1–1.0)
Monocytes Relative: 6 %
Monocytes Relative: 6 %
Neutro Abs: 11.1 10*3/uL — ABNORMAL HIGH (ref 1.7–7.7)
Neutro Abs: 10.6 10*3/uL — ABNORMAL HIGH (ref 1.7–7.7)
Neutrophils Relative %: 92 %
Neutrophils Relative %: 92 %
Platelets: 230 10*3/uL (ref 150–400)
Platelets: 238 10*3/uL (ref 150–400)
RBC: 3.53 MIL/uL — ABNORMAL LOW (ref 3.87–5.11)
RBC: 3.43 MIL/uL — ABNORMAL LOW (ref 3.87–5.11)
RDW: 21.7 % — ABNORMAL HIGH (ref 11.5–15.5)
RDW: 22 % — ABNORMAL HIGH (ref 11.5–15.5)
Smear Review: NORMAL
WBC: 12.1 10*3/uL — ABNORMAL HIGH (ref 4.0–10.5)
WBC: 11.6 10*3/uL — ABNORMAL HIGH (ref 4.0–10.5)
nRBC: 0 % (ref 0.0–0.2)
nRBC: 0 % (ref 0.0–0.2)

## 2022-02-12 LAB — URINALYSIS, COMPLETE (UACMP) WITH MICROSCOPIC
Bilirubin Urine: NEGATIVE
Glucose, UA: NEGATIVE mg/dL
Hgb urine dipstick: NEGATIVE
Ketones, ur: 20 mg/dL — AB
Leukocytes,Ua: NEGATIVE
Nitrite: NEGATIVE
Protein, ur: 30 mg/dL — AB
Specific Gravity, Urine: 1.017 (ref 1.005–1.030)
pH: 5 (ref 5.0–8.0)

## 2022-02-12 LAB — BLOOD GAS, VENOUS
Acid-Base Excess: 2.8 mmol/L — ABNORMAL HIGH (ref 0.0–2.0)
Bicarbonate: 27.2 mmol/L (ref 20.0–28.0)
O2 Saturation: 61.8 %
Patient temperature: 37
pCO2, Ven: 40 mmHg — ABNORMAL LOW (ref 44–60)
pH, Ven: 7.44 — ABNORMAL HIGH (ref 7.25–7.43)
pO2, Ven: 39 mmHg (ref 32–45)

## 2022-02-12 LAB — TSH: TSH: 1.895 u[IU]/mL (ref 0.350–4.500)

## 2022-02-12 LAB — PROTIME-INR
INR: 1 (ref 0.8–1.2)
Prothrombin Time: 13.4 seconds (ref 11.4–15.2)

## 2022-02-12 LAB — RESP PANEL BY RT-PCR (FLU A&B, COVID) ARPGX2
Influenza A by PCR: NEGATIVE
Influenza B by PCR: NEGATIVE
SARS Coronavirus 2 by RT PCR: NEGATIVE

## 2022-02-12 LAB — LACTIC ACID, PLASMA
Lactic Acid, Venous: 2.5 mmol/L (ref 0.5–1.9)
Lactic Acid, Venous: 1 mmol/L (ref 0.5–1.9)

## 2022-02-12 LAB — MRSA NEXT GEN BY PCR, NASAL: MRSA by PCR Next Gen: NOT DETECTED

## 2022-02-12 LAB — MAGNESIUM: Magnesium: 1.2 mg/dL — ABNORMAL LOW (ref 1.7–2.4)

## 2022-02-12 LAB — STREP PNEUMONIAE URINARY ANTIGEN: Strep Pneumo Urinary Antigen: NEGATIVE

## 2022-02-12 LAB — BRAIN NATRIURETIC PEPTIDE: B Natriuretic Peptide: 77 pg/mL (ref 0.0–100.0)

## 2022-02-12 LAB — APTT: aPTT: 33 seconds (ref 24–36)

## 2022-02-12 LAB — PROCALCITONIN: Procalcitonin: 0.23 ng/mL

## 2022-02-12 LAB — PHOSPHORUS: Phosphorus: 3.4 mg/dL (ref 2.5–4.6)

## 2022-02-12 MED ORDER — VANCOMYCIN HCL 500 MG/100ML IV SOLN: 500.0000 mg | INTRAVENOUS | Status: DC

## 2022-02-12 MED ORDER — LACTATED RINGERS IV SOLN: INTRAVENOUS | Status: DC

## 2022-02-12 MED ORDER — VANCOMYCIN HCL 750 MG/150ML IV SOLN
750.0000 mg | Freq: Once | INTRAVENOUS | Status: AC
  Administered 2022-02-12: 750 mg via INTRAVENOUS
  Filled 2022-02-12: qty 150

## 2022-02-12 MED ORDER — POTASSIUM CHLORIDE CRYS ER 20 MEQ PO TBCR
40.0000 meq | EXTENDED_RELEASE_TABLET | Freq: Once | ORAL | Status: AC
  Administered 2022-02-12: 40 meq via ORAL
  Filled 2022-02-12 (×2): qty 2

## 2022-02-12 MED ORDER — IOHEXOL 350 MG/ML SOLN
75.0000 mL | Freq: Once | INTRAVENOUS | Status: AC | PRN
  Administered 2022-02-12: 75 mL via INTRAVENOUS

## 2022-02-12 MED ORDER — DM-GUAIFENESIN ER 30-600 MG PO TB12
1.0000 | ORAL_TABLET | Freq: Two times a day (BID) | ORAL | Status: DC | PRN
  Administered 2022-02-12: 1 via ORAL
  Filled 2022-02-12: qty 1

## 2022-02-12 MED ORDER — VENLAFAXINE HCL ER 75 MG PO CP24
150.0000 mg | ORAL_CAPSULE | Freq: Every day | ORAL | Status: DC
  Administered 2022-02-13 – 2022-02-20 (×8): 150 mg via ORAL
  Filled 2022-02-12 (×3): qty 2
  Filled 2022-02-12: qty 1
  Filled 2022-02-12 (×4): qty 2

## 2022-02-12 MED ORDER — SUCRALFATE 1 G PO TABS
1.0000 g | ORAL_TABLET | Freq: Four times a day (QID) | ORAL | Status: DC
  Administered 2022-02-12 – 2022-02-20 (×31): 1 g via ORAL
  Filled 2022-02-12 (×31): qty 1

## 2022-02-12 MED ORDER — ALBUTEROL SULFATE (2.5 MG/3ML) 0.083% IN NEBU: 2.5000 mg | INHALATION_SOLUTION | RESPIRATORY_TRACT | Status: DC | PRN

## 2022-02-12 MED ORDER — DIPHENHYDRAMINE HCL 50 MG/ML IJ SOLN: 12.5000 mg | Freq: Three times a day (TID) | INTRAMUSCULAR | Status: DC | PRN

## 2022-02-12 MED ORDER — ENSURE ENLIVE PO LIQD
237.0000 mL | Freq: Two times a day (BID) | ORAL | Status: DC
  Administered 2022-02-13 (×2): 237 mL via ORAL

## 2022-02-12 MED ORDER — ENOXAPARIN SODIUM 40 MG/0.4ML IJ SOSY
40.0000 mg | PREFILLED_SYRINGE | INTRAMUSCULAR | Status: DC
  Administered 2022-02-12: 40 mg via SUBCUTANEOUS
  Filled 2022-02-12: qty 0.4

## 2022-02-12 MED ORDER — SODIUM CHLORIDE 0.9 % IV SOLN
2.0000 g | Freq: Once | INTRAVENOUS | Status: AC
  Administered 2022-02-12: 2 g via INTRAVENOUS
  Filled 2022-02-12: qty 12.5

## 2022-02-12 MED ORDER — IPRATROPIUM-ALBUTEROL 0.5-2.5 (3) MG/3ML IN SOLN
3.0000 mL | RESPIRATORY_TRACT | Status: DC
  Administered 2022-02-12 – 2022-02-13 (×7): 3 mL via RESPIRATORY_TRACT
  Filled 2022-02-12 (×7): qty 3

## 2022-02-12 MED ORDER — HYDROCODONE-ACETAMINOPHEN 5-325 MG PO TABS
1.0000 | ORAL_TABLET | Freq: Four times a day (QID) | ORAL | Status: DC | PRN
  Administered 2022-02-13: 1 via ORAL
  Filled 2022-02-12: qty 1

## 2022-02-12 MED ORDER — ENOXAPARIN SODIUM 30 MG/0.3ML IJ SOSY
30.0000 mg | PREFILLED_SYRINGE | INTRAMUSCULAR | Status: DC
  Administered 2022-02-13 – 2022-02-19 (×7): 30 mg via SUBCUTANEOUS
  Filled 2022-02-12 (×7): qty 0.3

## 2022-02-12 MED ORDER — IPRATROPIUM-ALBUTEROL 0.5-2.5 (3) MG/3ML IN SOLN
3.0000 mL | Freq: Once | RESPIRATORY_TRACT | Status: AC
  Administered 2022-02-12: 3 mL via RESPIRATORY_TRACT
  Filled 2022-02-12: qty 3

## 2022-02-12 MED ORDER — DOCUSATE SODIUM 50 MG/5ML PO LIQD
50.0000 mg | Freq: Every day | ORAL | Status: DC
  Administered 2022-02-12 – 2022-02-20 (×5): 50 mg via ORAL
  Filled 2022-02-12 (×8): qty 10

## 2022-02-12 MED ORDER — MAGNESIUM CHLORIDE 64 MG PO TBEC
1.0000 | DELAYED_RELEASE_TABLET | Freq: Every day | ORAL | Status: DC
  Administered 2022-02-13 – 2022-02-15 (×3): 64 mg via ORAL
  Filled 2022-02-12 (×3): qty 1

## 2022-02-12 MED ORDER — TRAZODONE HCL 100 MG PO TABS
300.0000 mg | ORAL_TABLET | Freq: Every day | ORAL | Status: DC
  Administered 2022-02-12 – 2022-02-19 (×8): 300 mg via ORAL
  Filled 2022-02-12 (×9): qty 3

## 2022-02-12 MED ORDER — MAGNESIUM SULFATE 2 GM/50ML IV SOLN
2.0000 g | Freq: Once | INTRAVENOUS | Status: AC
  Administered 2022-02-12: 2 g via INTRAVENOUS
  Filled 2022-02-12: qty 50

## 2022-02-12 MED ORDER — ADULT MULTIVITAMIN W/MINERALS CH
1.0000 | ORAL_TABLET | Freq: Every day | ORAL | Status: DC
  Administered 2022-02-12 – 2022-02-20 (×9): 1 via ORAL
  Filled 2022-02-12 (×9): qty 1

## 2022-02-12 MED ORDER — TIMOLOL MALEATE 0.5 % OP SOLN
1.0000 [drp] | Freq: Two times a day (BID) | OPHTHALMIC | Status: DC
  Administered 2022-02-13 – 2022-02-20 (×14): 1 [drp] via OPHTHALMIC
  Filled 2022-02-12: qty 5

## 2022-02-12 MED ORDER — VITAMIN D 25 MCG (1000 UNIT) PO TABS
2000.0000 [IU] | ORAL_TABLET | Freq: Every day | ORAL | Status: DC
  Administered 2022-02-12 – 2022-02-20 (×9): 2000 [IU] via ORAL
  Filled 2022-02-12 (×9): qty 2

## 2022-02-12 MED ORDER — METOPROLOL TARTRATE 5 MG/5ML IV SOLN
2.5000 mg | INTRAVENOUS | Status: DC | PRN
  Administered 2022-02-12 – 2022-02-13 (×3): 2.5 mg via INTRAVENOUS
  Filled 2022-02-12 (×3): qty 5

## 2022-02-12 MED ORDER — LACTATED RINGERS IV BOLUS: 1000.0000 mL | Freq: Once | INTRAVENOUS | Status: DC

## 2022-02-12 MED ORDER — VANCOMYCIN HCL IN DEXTROSE 1-5 GM/200ML-% IV SOLN: 1000.0000 mg | Freq: Once | INTRAVENOUS | Status: DC

## 2022-02-12 MED ORDER — BRIMONIDINE TARTRATE 0.2 % OP SOLN
1.0000 [drp] | Freq: Two times a day (BID) | OPHTHALMIC | Status: DC
  Administered 2022-02-13 – 2022-02-20 (×14): 1 [drp] via OPHTHALMIC
  Filled 2022-02-12: qty 5

## 2022-02-12 MED ORDER — POTASSIUM CHLORIDE 10 MEQ/100ML IV SOLN
10.0000 meq | INTRAVENOUS | Status: AC
  Administered 2022-02-12 (×2): 10 meq via INTRAVENOUS
  Filled 2022-02-12 (×2): qty 100

## 2022-02-12 MED ORDER — ERYTHROMYCIN 5 MG/GM OP OINT
1.0000 | TOPICAL_OINTMENT | Freq: Every day | OPHTHALMIC | Status: DC
  Filled 2022-02-12: qty 1

## 2022-02-12 MED ORDER — PANTOPRAZOLE SODIUM 20 MG PO TBEC
20.0000 mg | DELAYED_RELEASE_TABLET | Freq: Every day | ORAL | Status: DC
  Administered 2022-02-12 – 2022-02-20 (×9): 20 mg via ORAL
  Filled 2022-02-12 (×10): qty 1

## 2022-02-12 MED ORDER — BRIMONIDINE TARTRATE-TIMOLOL 0.2-0.5 % OP SOLN: 1.0000 [drp] | Freq: Two times a day (BID) | OPHTHALMIC | Status: DC

## 2022-02-12 MED ORDER — IBUPROFEN 400 MG PO TABS: 200.0000 mg | ORAL_TABLET | Freq: Four times a day (QID) | ORAL | Status: DC | PRN

## 2022-02-12 MED ORDER — SODIUM CHLORIDE 0.9 % IV SOLN
2.0000 g | INTRAVENOUS | Status: AC
  Administered 2022-02-13 – 2022-02-16 (×4): 2 g via INTRAVENOUS
  Filled 2022-02-12 (×4): qty 12.5

## 2022-02-12 MED ORDER — LORATADINE 10 MG PO TABS
10.0000 mg | ORAL_TABLET | Freq: Every day | ORAL | Status: DC
  Administered 2022-02-12 – 2022-02-20 (×9): 10 mg via ORAL
  Filled 2022-02-12 (×9): qty 1

## 2022-02-12 MED ORDER — LACTATED RINGERS IV BOLUS
1000.0000 mL | Freq: Once | INTRAVENOUS | Status: AC
  Administered 2022-02-12: 1000 mL via INTRAVENOUS

## 2022-02-12 MED ORDER — LACTATED RINGERS IV BOLUS
500.0000 mL | Freq: Once | INTRAVENOUS | Status: AC
  Administered 2022-02-12: 500 mL via INTRAVENOUS

## 2022-02-12 MED ORDER — ACETAMINOPHEN 325 MG PO TABS
650.0000 mg | ORAL_TABLET | Freq: Four times a day (QID) | ORAL | Status: DC | PRN
  Administered 2022-02-12: 650 mg via ORAL
  Filled 2022-02-12: qty 2

## 2022-02-12 NOTE — ED Notes (Signed)
Pt to CT

## 2022-02-12 NOTE — Progress Notes (Signed)
Received message from infusion. Patient was not answering door at her home when Zenaida Niece driver came to pick her up. He entered home and she was somewhat confused, wearing brief, and tshirt. She was able to dress and came to clinic. In clinic she was found to be febrile and tachycardic. She reports SOB to nursing. I've recommended patient be evaluated in ER. She has progressive disease on recent imaging. Last chemo on 01/08/22. I have not assessed the patient.

## 2022-02-12 NOTE — ED Notes (Signed)
Physician from cancer center called this writer to inform me that the home health aide went to the pt's home and she was disoriented and altered. Pt then came to the the cancer center were she was febrile and tachycardic.

## 2022-02-12 NOTE — Progress Notes (Signed)
PHARMACIST - PHYSICIAN COMMUNICATION  CONCERNING:  Enoxaparin (Lovenox) for DVT Prophylaxis    RECOMMENDATION: Patient was prescribed enoxaparin 40mg  q24 hours for VTE prophylaxis.   Filed Weights   02/12/22 1052  Weight: 30.5 kg (67 lb 3.8 oz)    Body mass index is 13.13 kg/m.  Estimated Creatinine Clearance: 29.3 mL/min (by C-G formula based on SCr of 0.74 mg/dL).  Patient is candidate for enoxaparin 30mg  every 24 hours based on CrCl <63ml/min or Weight <45kg  DESCRIPTION: Pharmacy has adjusted enoxaparin dose per [facility] policy.  Patient is now receiving enoxaparin 30 mg every 24 hours   31m 02/12/2022 6:31 PM

## 2022-02-12 NOTE — H&P (Signed)
History and Physical    Carol Shaffer XLK:[id number] DOB: 07-05-1945 DOA: 02/12/2022  Referring MD/NP/PA:   PCP: Albina Billet, MD   Patient coming from:  The patient is coming from home.  At baseline, pt is independent for most of ADL.        Chief Complaint: Fever, shortness breath  HPI: Carol Shaffer is a 77 y.o. female with medical history significant of NSCLC (s/p of right lower lobe lobectomy, radiation and chemotherapy), COPD, former smoker, stroke, GERD, depression, who presents with fever, SOB.  Patient states that she has fever, chills, cough, shortness breath for more than 3 days, which has been progressively worsening.  Patient has cough with brownish colored sputum production.  Patient normally is not using oxygen, but was found to have oxygen desaturation to 83% on room air, which initally improved to 92-93% on 4 L oxygen in ED, but deteriorated with worsening shortness of breath, using accessory muscle for breathing, started BiPAP in ED. Patient does not have nausea, vomiting, diarrhea or abdominal pain.  No symptoms of UTI.  She states that she had mild wheezing at home, which has resolved.  Data reviewed independently and ED Course: pt was found to have WBC 11.6, negative PCR for COVID and flu, potassium 3.0, GFR> 60, temperature 101, blood pressure 118/97, heart rate 130s, RR 23.  Patient is admitted to PCU as inpatient.  CTA: 1. Negative for pulmonary embolism. 2. Extensive new septal thickening and parenchymal densities throughout the right lower lobe. Mild septal thickening scattered throughout the left lung. Findings are concerning for infection / inflammation in the right lower lobe but there may also be asymmetric pulmonary edema or multifocal infection in the left lung. 3. Focal consolidated area in the right lower lobe has minimally changed. 4. Concern for enlarged right subcarinal nodal tissue. This could be reactive but recommend close surveillance. 5. Aortic  Atherosclerosis (ICD10-I70.0) and Emphysema (ICD10-J43.9).  CXR: 1. Chronic right hilar and infrahilar density consistent with known treated right lung cancer. 2. Underlying emphysematous changes and pulmonary scarring.  EKG: I have personally reviewed.  Sinus rhythm, QTc 551, bilateral atrial enlargement, low voltage.   Review of Systems:   General: has fevers, chills, no body weight gain,  has fatigue HEENT: no blurry vision, hearing changes or sore throat Respiratory: has dyspnea, coughing, no wheezing CV: no chest pain, no palpitations GI: no nausea, vomiting, abdominal pain, diarrhea, constipation GU: no dysuria, burning on urination, increased urinary frequency, hematuria  Ext: no leg edema Neuro: no unilateral weakness, numbness, or tingling, no vision change or hearing loss Skin: no rash, no skin tear. MSK: No muscle spasm, no deformity, no limitation of range of movement in spin Heme: No easy bruising.  Travel history: No recent long distant travel.   Allergy:  Allergies  Allergen Reactions   Hydromorphone Hcl Itching   Bentyl [Dicyclomine] Nausea And Vomiting   Biaxin [Clarithromycin] Other (See Comments)    hallucinations   Budesonide Other (See Comments)    Ulcer   Erythromycin Nausea And Vomiting   Reglan [Metoclopramide] Other (See Comments)    tremors    Past Medical History:  Diagnosis Date   Arthritis    hands   Avascular necrosis of hip, right (HCC)    Blepharospasm    Cancer ([place]) 2011   Right Upper Lobe Lobectomy   Chronic diarrhea    Chronic diarrhea    Collagenous colitis    Complication of anesthesia    usually wakes  up during procedures  (endoscopy and colonoscopy)   COPD (chronic obstructive pulmonary disease) ([place])    COVID 2022   Depression    Diverticulosis    Gastric outlet obstruction    Headache    every couple of days   Hemorrhoid    History of Crohn's disease    Hypoglycemic disorder    IDA (iron deficiency anemia)     Intestinal adhesions    Multiple gastric ulcers    Multiple gastric ulcers    Nausea and vomiting 07/03/2016   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[place])    Stroke ([place]) 8 yrs ago   double vision left eye    Past Surgical History:  Procedure Laterality Date   APPENDECTOMY  1989   botox injections for blepharospasm     BREAST SURGERY     CATARACT EXTRACTION     COLON RESECTION  2005   COLONOSCOPY  10-29-2008   [name]   COLONOSCOPY WITH PROPOFOL N/A 03/05/2017   Procedure: COLONOSCOPY WITH PROPOFOL;  Surgeon: Toledo, Benay Pike, MD;  Location: [facility];  Service: Gastroenterology;  Laterality: N/A;   COLOSTOMY REVERSAL  2006   ESOPHAGOGASTRODUODENOSCOPY (EGD) WITH PROPOFOL N/A 07/18/2016   Procedure: ESOPHAGOGASTRODUODENOSCOPY (EGD) WITH PROPOFOL;  Surgeon: Robert Bellow, MD;  Location: [facility];  Service: Endoscopy;  Laterality: N/A;   ESOPHAGOGASTRODUODENOSCOPY (EGD) WITH PROPOFOL N/A 08/03/2016   Procedure: ESOPHAGOGASTRODUODENOSCOPY (EGD) WITH PROPOFOL;  Surgeon: Robert Bellow, MD;  Location: [facility];  Service: Endoscopy;  Laterality: N/A;   ESOPHAGOGASTRODUODENOSCOPY (EGD) WITH PROPOFOL N/A 09/01/2018   Procedure: ESOPHAGOGASTRODUODENOSCOPY (EGD) WITH PROPOFOL;  Surgeon: Toledo, Benay Pike, MD;  Location: [facility];  Service: Gastroenterology;  Laterality: N/A;   ESOPHAGOGASTRODUODENOSCOPY (EGD) WITH PROPOFOL N/A 12/01/2018   Procedure: ESOPHAGOGASTRODUODENOSCOPY (EGD) WITH PROPOFOL;  Surgeon: Toledo, Benay Pike, MD;  Location: [facility];  Service: Gastroenterology;  Laterality: N/A;   ESOPHAGOGASTRODUODENOSCOPY (EGD) WITH PROPOFOL N/A 02/23/2019   Procedure: ESOPHAGOGASTRODUODENOSCOPY (EGD) WITH PROPOFOL;  Surgeon: Lucilla Lame, MD;  Location: [facility];  Service: Endoscopy;  Laterality: N/A;   ESOPHAGOSCOPY WITH DILITATION  2012,2015   [facility] [place]   EYE SURGERY     FLEXIBLE BRONCHOSCOPY N/A 10/30/2021   Procedure: FLEXIBLE  BRONCHOSCOPY;  Surgeon: Armando Reichert, MD;  Location: [facility];  Service: Pulmonary;  Laterality: N/A;   GASTRECTOMY     gastric ulcer  1989, 1991   IR IMAGING GUIDED PORT INSERTION  11/14/2021   JOINT REPLACEMENT     right total hip arthroplasty 03/03/02   LAPAROSCOPIC LYSIS OF ADHESIONS     LUNG CANCER SURGERY  2011   LYSIS OF ADHESION     PLACEMENT OF BREAST IMPLANTS  1973   thoracotomy with right upper lobectomy and central compartment node dissection     UPPER GASTROINTESTINAL ENDOSCOPY  10-29-2008   [name]   VIDEO BRONCHOSCOPY WITH ENDOBRONCHIAL ULTRASOUND N/A 10/30/2021   Procedure: VIDEO BRONCHOSCOPY WITH ENDOBRONCHIAL ULTRASOUND;  Surgeon: Armando Reichert, MD;  Location: [facility];  Service: Pulmonary;  Laterality: N/A;    Social History:  reports that she quit smoking about 14 years ago. Her smoking use included cigarettes. She has a 25.00 pack-year smoking history. She has never used smokeless tobacco. She reports current alcohol use. She reports that she does not use drugs.  Family History:  Family History  Problem Relation Age of Onset   Hypertension Mother    Diabetes Father      Prior to Admission medications   Medication Sig Start Date End  Date Taking? Authorizing Provider  erythromycin ophthalmic ointment Place 1 Application into both eyes at bedtime. 02/08/22   Noreene Filbert, MD  HYDROcodone-acetaminophen (NORCO/VICODIN) 5-325 MG tablet Take 1 tablet by mouth every 6 (six) hours as needed for moderate pain. 01/25/22  Yes [provider]  acetaminophen (TYLENOL) 500 MG tablet Take 500 mg by mouth every 6 (six) hours as needed.    [provider]  brimonidine-timolol (COMBIGAN) 0.2-0.5 % ophthalmic solution INT 1 GTT IN OU BID 10/29/17   [provider]  Cholecalciferol (VITAMIN D3) 1000 units CAPS Take 1 capsule by mouth daily.    [provider]  dexamethasone (DECADRON) 2 MG tablet Take 1 tablet (2 mg total) by mouth 2 (two) times  daily with a meal. 01/09/22   Chrystal, Eulas Post, MD  docusate (COLACE) 50 MG/5ML liquid Take by mouth daily.    [provider]  fluticasone (FLONASE) 50 MCG/ACT nasal spray Place 2 sprays into both nostrils daily.    [provider]  lansoprazole (PREVACID) 30 MG capsule Take 1 capsule (30 mg total) by mouth daily at 12 noon. 01/09/22   Noreene Filbert, MD  loratadine (CLARITIN) 10 MG tablet Take 10 mg by mouth daily.    [provider]  magic mouthwash (nystatin, lidocaine, diphenhydrAMINE, alum & mag hydroxide) suspension Swish and swallow 5 mLs 4 (four) times daily as needed for mouth pain. 01/26/22   Hughie Closs, PA-C  magnesium chloride (SLOW-MAG) 64 MG TBEC SR tablet Take 1 tablet (64 mg total) by mouth daily. 01/15/22   Borders, Kirt Boys, NP  Multiple Vitamin (MULTI-VITAMINS) TABS Take 1 tablet by mouth daily. once daily.    [provider]  NON FORMULARY Take 1 Dose by mouth daily. BONE BROTH    [provider]  oxyCODONE (OXY IR/ROXICODONE) 5 MG immediate release tablet Take 2 tablets (10 mg total) by mouth 3 (three) times daily as needed for severe pain. Patient not taking: Reported on 02/02/2022 11/27/21   Sindy Guadeloupe, MD  pantoprazole (PROTONIX) 40 MG tablet Take 40 mg by mouth 2 (two) times daily. 01/31/22   [provider]  potassium chloride (KLOR-CON M) 10 MEQ tablet Take 1 tablet (10 mEq total) by mouth daily. 01/15/22   Borders, Kirt Boys, NP  sucralfate (CARAFATE) 1 G tablet Take 1 g by mouth 4 (four) times daily.  10/28/13   [provider]  traZODone (DESYREL) 150 MG tablet Take 300 mg by mouth at bedtime.  10/28/13   [provider]  venlafaxine XR (EFFEXOR-XR) 150 MG 24 hr capsule Take 150 mg by mouth daily with breakfast.  10/28/13   [provider]    Physical Exam: Vitals:   02/12/22 1200 02/12/22 1230 02/12/22 1605 02/12/22 1729  BP: (!) 118/97 112/69 127/76   Pulse: (!) 133 (!) 132 (!)  130   Resp: (!) 23  (!) 22   Temp:    98.9 F (37.2 C)  TempSrc:    Oral  SpO2: 92% 90% 94%   Weight:      Height:       General: in acute respiratory distress.  Cachectic looking HEENT:       Eyes: PERRL, EOMI, no scleral icterus.       ENT: No discharge from the ears and nose, no pharynx injection, no tonsillar enlargement.        Neck: No JVD, no bruit, no mass felt. Heme: No neck lymph node enlargement. Cardiac: S1/S2, RRR, No  murmurs, No gallops or rubs. Respiratory: Has coarse breathing sound bilaterally GI: Soft, nondistended, nontender, no rebound pain, no organomegaly, BS present. GU: No hematuria Ext: No pitting leg edema bilaterally. 1+DP/PT pulse bilaterally. Musculoskeletal: No joint deformities, No joint redness or warmth, no limitation of ROM in spin. Skin: No rashes.  Neuro: Alert, oriented X3, cranial nerves II-XII grossly intact, moves all extremities normally. Psych: Patient is not psychotic, no suicidal or hemocidal ideation.  Labs on Admission: I have personally reviewed following labs and imaging studies  CBC: Recent Labs  Lab 02/12/22 0850 02/12/22 1108  WBC 12.1* 11.6*  NEUTROABS 11.1* 10.6*  HGB 11.1* 10.7*  HCT 34.6* 33.7*  MCV 98.0 98.3  PLT 230 295   Basic Metabolic Panel: Recent Labs  Lab 02/12/22 0850 02/12/22 1108  NA 137 137  K 2.9* 3.0*  CL 99 104  CO2 22 21*  GLUCOSE 147* 200*  BUN 41* 51*  CREATININE 0.84 0.74  CALCIUM 8.6* 8.7*  MG 1.2*  --    GFR: Estimated Creatinine Clearance: 29.3 mL/min (by C-G formula based on SCr of 0.74 mg/dL). Liver Function Tests: Recent Labs  Lab 02/12/22 0850 02/12/22 1108  AST 35 36  ALT 32 32  ALKPHOS 64 54  BILITOT 0.5 0.8  PROT 5.7* 5.9*  ALBUMIN 2.9* 2.8*   No results for input(s): "LIPASE", "AMYLASE" in the last 168 hours. No results for input(s): "AMMONIA" in the last 168 hours. Coagulation Profile: Recent Labs  Lab 02/12/22 1108  INR 1.0   Cardiac Enzymes: No results  for input(s): "CKTOTAL", "CKMB", "CKMBINDEX", "TROPONINI" in the last 168 hours. BNP (last 3 results) No results for input(s): "PROBNP" in the last 8760 hours. HbA1C: No results for input(s): "HGBA1C" in the last 72 hours. CBG: No results for input(s): "GLUCAP" in the last 168 hours. Lipid Profile: No results for input(s): "CHOL", "HDL", "LDLCALC", "TRIG", "CHOLHDL", "LDLDIRECT" in the last 72 hours. Thyroid Function Tests: Recent Labs    02/12/22 0850  TSH 1.895   Anemia Panel: No results for input(s): "VITAMINB12", "FOLATE", "FERRITIN", "TIBC", "IRON", "RETICCTPCT" in the last 72 hours. Urine analysis:    Component Value Date/Time   COLORURINE YELLOW (A) 02/12/2022 1132   APPEARANCEUR HAZY (A) 02/12/2022 1132   LABSPEC 1.017 02/12/2022 1132   PHURINE 5.0 02/12/2022 1132   GLUCOSEU NEGATIVE 02/12/2022 1132   HGBUR NEGATIVE 02/12/2022 1132   BILIRUBINUR NEGATIVE 02/12/2022 1132   KETONESUR 20 (A) 02/12/2022 1132   PROTEINUR 30 (A) 02/12/2022 1132   NITRITE NEGATIVE 02/12/2022 1132   LEUKOCYTESUR NEGATIVE 02/12/2022 1132   Sepsis Labs: @LABRCNTIP (procalcitonin:4,lacticidven:4) ) Recent Results (from the past 240 hour(s))  Resp Panel by RT-PCR (Flu A&B, Covid) Anterior Nasal Swab     Status: None   Collection Time: 02/12/22 11:08 AM   Specimen: Anterior Nasal Swab  Result Value Ref Range Status   SARS Coronavirus 2 by RT PCR NEGATIVE NEGATIVE Final    Comment: (NOTE) SARS-CoV-2 target nucleic acids are NOT DETECTED.  The SARS-CoV-2 RNA is generally detectable in upper respiratory specimens during the acute phase of infection. The lowest concentration of SARS-CoV-2 viral copies this assay can detect is 138 copies/mL. A negative result does not preclude SARS-Cov-2 infection and should not be used as the sole basis for treatment or other patient management decisions. A negative result may occur with  improper specimen collection/handling, submission of specimen  other than nasopharyngeal swab, presence of viral mutation(s) within the areas targeted by this assay, and inadequate number  of viral copies(<138 copies/mL). A negative result must be combined with clinical observations, patient history, and epidemiological information. The expected result is Negative.  Fact Sheet for Patients:  EntrepreneurPulse.com.au  Fact Sheet for Healthcare Providers:  IncredibleEmployment.be  This test is no t yet approved or cleared by the [place] FDA and  has been authorized for detection and/or diagnosis of SARS-CoV-2 by FDA under an Emergency Use Authorization (EUA). This EUA will remain  in effect (meaning this test can be used) for the duration of the COVID-19 declaration under Section 564(b)(1) of the Act, 21 U.S.C.section 360bbb-3(b)(1), unless the authorization is terminated  or revoked sooner.       Influenza A by PCR NEGATIVE NEGATIVE Final   Influenza B by PCR NEGATIVE NEGATIVE Final    Comment: (NOTE) The Xpert Xpress SARS-CoV-2/FLU/RSV plus assay is intended as an aid in the diagnosis of influenza from Nasopharyngeal swab specimens and should not be used as a sole basis for treatment. Nasal washings and aspirates are unacceptable for Xpert Xpress SARS-CoV-2/FLU/RSV testing.  Fact Sheet for Patients: EntrepreneurPulse.com.au  Fact Sheet for Healthcare Providers: IncredibleEmployment.be  This test is not yet approved or cleared by the [place] FDA and has been authorized for detection and/or diagnosis of SARS-CoV-2 by FDA under an Emergency Use Authorization (EUA). This EUA will remain in effect (meaning this test can be used) for the duration of the COVID-19 declaration under Section 564(b)(1) of the Act, 21 U.S.C. section 360bbb-3(b)(1), unless the authorization is terminated or revoked.  Performed at [facility], [address]  [place], [place] [address]      Radiological Exams on Admission: CT Angio Chest PE W and/or Wo Contrast  Result Date: 02/12/2022 CLINICAL DATA:  77 year old with pulmonary embolism suspected, high probability. Fever and low oxygen level. History of lung cancer. EXAM: CT ANGIOGRAPHY CHEST WITH CONTRAST TECHNIQUE: Multidetector CT imaging of the chest was performed using the standard protocol during bolus administration of intravenous contrast. Multiplanar CT image reconstructions and MIPs were obtained to evaluate the vascular anatomy. RADIATION DOSE REDUCTION: This exam was performed according to the departmental dose-optimization program which includes automated exposure control, adjustment of the mA and/or kV according to patient size and/or use of iterative reconstruction technique. CONTRAST:  75m OMNIPAQUE IOHEXOL 350 MG/ML SOLN COMPARISON:  CT chest, abdomen and pelvis 01/24/2022 FINDINGS: Cardiovascular: Negative for pulmonary embolism. Atherosclerotic calcifications involving the thoracic aorta without aneurysm or dissection. Great vessels are patent. Bilateral proximal vertebral arteries are patent. Right jugular Port-A-Cath tip near the superior cavoatrial junction. Heart size is normal. Mediastinum/Nodes: Thyroid tissue is unremarkable. Concern for increased right subcarinal tissue on image 67/4 measuring up to 1.0 cm in the short axis and this tissue roughly measures 0.5 cm on the previous chest CT. No other significant mediastinal or hilar lymph node enlargement. No axillary lymph node enlargement. Lungs/Pleura: Severe centrilobular emphysema. Postoperative changes compatible with a right upper lobectomy. The focal parenchymal lesion or area of consolidation in the right lower lobe on image 55/5 measures approximately 2.5 x 2.5 cm and minimally changed compared to the recent comparison chest CT. However, there is extensive new septal thickening and parenchymal densities throughout the right lower lobe  concerning for infectious or inflammatory process. Small amount of atelectasis along the posterior left lower lobe. Mild septal thickening in left lung. No large pleural effusions. Upper Abdomen: Bilateral renal cysts that do not require dedicated follow-up. No acute abnormality in the visualized upper abdomen. Musculoskeletal: Old right posterior seventh  rib fracture with some callus formation. Old T7 vertebral body compression fracture. No acute bone abnormality. Bilateral breast implants. Review of the MIP images confirms the above findings. IMPRESSION: 1. Negative for pulmonary embolism. 2. Extensive new septal thickening and parenchymal densities throughout the right lower lobe. Mild septal thickening scattered throughout the left lung. Findings are concerning for infection / inflammation in the right lower lobe but there may also be asymmetric pulmonary edema or multifocal infection in the left lung. 3. Focal consolidated area in the right lower lobe has minimally changed. 4. Concern for enlarged right subcarinal nodal tissue. This could be reactive but recommend close surveillance. 5. Aortic Atherosclerosis (ICD10-I70.0) and Emphysema (ICD10-J43.9). Electronically Signed   By: Markus Daft M.D.   On: 02/12/2022 13:25   DG Chest Port 1 View  Result Date: 02/12/2022 CLINICAL DATA:  Shortness of breath. EXAM: PORTABLE CHEST 1 VIEW COMPARISON:  Chest x-ray 10/30/2021 FINDINGS: The cardiac silhouette, mediastinal and hilar contours are within normal limits and stable. Underlying emphysematous changes and pulmonary scarring. Right hilar and infrahilar density appears stable and is consistent with known treated right lung cancer. No obvious superimposed infiltrate or effusion. IMPRESSION: 1. Chronic right hilar and infrahilar density consistent with known treated right lung cancer. 2. Underlying emphysematous changes and pulmonary scarring. Electronically Signed   By: Marijo Sanes M.D.   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  - Will admit to PCU bed as inpt - BIPAP --> check VBG - IV Vancomycin and cefepime - Mucinex for cough  - Bronchodilators - Urine legionella and S. pneumococcal antigen - Follow up blood culture x2, sputum culture - will get Procalcitonin and trend lactic acid level per sepsis protocol - IVF: 1.5L of LR bolus in ED, followed by 75 mL per hour of NS   COPD (chronic obstructive pulmonary disease) (HCC) -Bronchodilators as above  Non-small cell cancer of middle lobe of right lung [facility]): S/p of right lower lobe lobectomy.  Patient is on radiation and chemotherapy, last chemo was 01/08/2022 -Follow-up with oncology  Depression -Continue home medications  Hypokalemia and hypomagnesemia: Potassium 3.0, magnesium 1.2 -Repleted potassium -Check phosphorus level  Gastric ulcer -Protonix and Carafate  Protein-calorie malnutrition, severe ([place]): Body weight 30.5 kg, BMI 13.13 -Ensure -Nutrition consult     DVT ppx: SQ Lovenox  Code Status: DNR (I discussed with patient and explained the meaning of CODE STATUS. Patient wants to be DNR)  Family Communication: I have tried to call her son who did not pick up the phone, I left a message to him   Disposition Plan:  Anticipate discharge back to previous environment  Consults called:  none  Admission status and  Level of care: Progressive:   as inpt       Dispo: The patient is from: Home              Anticipated d/c is to:  Home              Anticipated d/c date is: 2 days              Patient currently is not medically stable to d/c.    Severity of Illness:  The appropriate patient status for this patient is INPATIENT. Inpatient status is judged to be reasonable and necessary in order to provide the required intensity of service to ensure the patient's safety. The patient's presenting symptoms, physical exam findings, and initial radiographic and laboratory data in the context of their chronic comorbidities is felt to place them at high risk for further clinical deterioration. Furthermore, it is not anticipated that the patient will be medically stable for discharge from the hospital within 2 midnights of admission.   * I certify that at the point of admission it is my clinical judgment that the patient will require inpatient hospital care spanning beyond 2 midnights from the point of admission due to high intensity of service, high risk for further deterioration and high frequency of surveillance required.*       Date of Service 02/12/2022    Ivor Costa [facility]   If 7PM-7AM, please contact night-coverage [email or web address] 02/12/2022, 6:20 PM

## 2022-02-12 NOTE — ED Provider Notes (Signed)
[facility] Provider Note    None    (approximate)   History   Shortness of Breath   HPI  Carol Shaffer is a 77 y.o. female with a history of cancer  Has been experiencing a cough fatigue and shortness of breath developing over the last 3 or so days.  Went to oncology appointment today and her oxygen level was low and referred to the ER  She does believe she has been having some fevers as high as 101.  She got weak and had a fall on Saturday but no injuries.     Physical Exam   Triage Vital Signs: ED Triage Vitals [02/12/22 1047]  Enc Vitals Group     BP (!) 107/55     Pulse Rate (!) 139     Resp (!) 22     Temp 98.9 F (37.2 C)     Temp Source Oral     SpO2 (!) 83 %     Weight      Height      Head Circumference      Peak Flow      Pain Score      Pain Loc      Pain Edu?      Excl. in GC?     Most recent vital signs: Vitals:   02/12/22 1130 02/12/22 1200  BP: 101/73 (!) 118/97  Pulse: (!) 132 (!) 133  Resp: (!) 22 (!) 23  Temp:    SpO2: 91% 92%     General: Awake, no distress.  Appears fatigued but in no acute distress. CV:  Good peripheral perfusion.  Tachycardia.  Heart rate approximately 1 20-1 40 Resp:  Normal effort.  Lung sounds are clear, but she does exhibit occasional dry cough, and for aeration of the bases bilaterally.  No rales noted Abd:  No distention.  Soft nontender nondistended Other:  No lower extremity edema  Right upper chest port is accessed prior to arrival   ED Results / Procedures / Treatments   Labs (all labs ordered are listed, but only abnormal results are displayed) Labs Reviewed  LACTIC ACID, PLASMA - Abnormal; Notable for the following components:      Result Value   Lactic Acid, Venous 2.5 (*)    All other components within normal limits  COMPREHENSIVE METABOLIC PANEL - Abnormal; Notable for the following components:   Potassium 3.0 (*)    CO2 21 (*)    Glucose, Bld 200 (*)    BUN 51  (*)    Calcium 8.7 (*)    Total Protein 5.9 (*)    Albumin 2.8 (*)    All other components within normal limits  CBC WITH DIFFERENTIAL/PLATELET - Abnormal; Notable for the following components:   WBC 11.6 (*)    RBC 3.43 (*)    Hemoglobin 10.7 (*)    HCT 33.7 (*)    RDW 22.0 (*)    Neutro Abs 10.6 (*)    Lymphs Abs 0.2 (*)    Abs Immature Granulocytes 0.09 (*)    All other components within normal limits  URINALYSIS, COMPLETE (UACMP) WITH MICROSCOPIC - Abnormal; Notable for the following components:   Color, Urine YELLOW (*)    APPearance HAZY (*)    Ketones, ur 20 (*)    Protein, ur 30 (*)    Bacteria, UA MANY (*)    All other components within normal limits  RESP PANEL BY RT-PCR (FLU A&B, COVID)  ARPGX2  CULTURE, BLOOD (ROUTINE X 2)  CULTURE, BLOOD (ROUTINE X 2)  URINE CULTURE  LACTIC ACID, PLASMA  PROTIME-INR  APTT  BRAIN NATRIURETIC PEPTIDE     EKG  And interpreted by me at 1115 heart rate 140 QRS 60 QTc 550 Mild slurring of the QT interval and T waves, mild nonspecific abnormality.  Concerning prolongation of the QT interval.  QT.  No obvious frank ischemia but nonspecific T wave abnormality and tachycardia is present.  Sinus tachycardia   RADIOLOGY  Chest x-ray interpreted by me, I do not see any obvious evidence of new infiltrate though some suspicion that there could be infiltrate overlying the right lower lobe.  CT Angio Chest PE W and/or Wo Contrast  Result Date: 02/12/2022 CLINICAL DATA:  76 year old with pulmonary embolism suspected, high probability. Fever and low oxygen level. History of lung cancer. EXAM: CT ANGIOGRAPHY CHEST WITH CONTRAST TECHNIQUE: Multidetector CT imaging of the chest was performed using the standard protocol during bolus administration of intravenous contrast. Multiplanar CT image reconstructions and MIPs were obtained to evaluate the vascular anatomy. RADIATION DOSE REDUCTION: This exam was performed according to the departmental  dose-optimization program which includes automated exposure control, adjustment of the mA and/or kV according to patient size and/or use of iterative reconstruction technique. CONTRAST:  62mL OMNIPAQUE IOHEXOL 350 MG/ML SOLN COMPARISON:  CT chest, abdomen and pelvis 01/24/2022 FINDINGS: Cardiovascular: Negative for pulmonary embolism. Atherosclerotic calcifications involving the thoracic aorta without aneurysm or dissection. Great vessels are patent. Bilateral proximal vertebral arteries are patent. Right jugular Port-A-Cath tip near the superior cavoatrial junction. Heart size is normal. Mediastinum/Nodes: Thyroid tissue is unremarkable. Concern for increased right subcarinal tissue on image 67/4 measuring up to 1.0 cm in the short axis and this tissue roughly measures 0.5 cm on the previous chest CT. No other significant mediastinal or hilar lymph node enlargement. No axillary lymph node enlargement. Lungs/Pleura: Severe centrilobular emphysema. Postoperative changes compatible with a right upper lobectomy. The focal parenchymal lesion or area of consolidation in the right lower lobe on image 55/5 measures approximately 2.5 x 2.5 cm and minimally changed compared to the recent comparison chest CT. However, there is extensive new septal thickening and parenchymal densities throughout the right lower lobe concerning for infectious or inflammatory process. Small amount of atelectasis along the posterior left lower lobe. Mild septal thickening in left lung. No large pleural effusions. Upper Abdomen: Bilateral renal cysts that do not require dedicated follow-up. No acute abnormality in the visualized upper abdomen. Musculoskeletal: Old right posterior seventh rib fracture with some callus formation. Old T7 vertebral body compression fracture. No acute bone abnormality. Bilateral breast implants. Review of the MIP images confirms the above findings. IMPRESSION: 1. Negative for pulmonary embolism. 2. Extensive new septal  thickening and parenchymal densities throughout the right lower lobe. Mild septal thickening scattered throughout the left lung. Findings are concerning for infection / inflammation in the right lower lobe but there may also be asymmetric pulmonary edema or multifocal infection in the left lung. 3. Focal consolidated area in the right lower lobe has minimally changed. 4. Concern for enlarged right subcarinal nodal tissue. This could be reactive but recommend close surveillance. 5. Aortic Atherosclerosis (ICD10-I70.0) and Emphysema (ICD10-J43.9). Electronically Signed   By: Markus Daft M.D.   On: 02/12/2022 13:25   DG Chest Port 1 View  Result Date: 02/12/2022 CLINICAL DATA:  Shortness of breath. EXAM: PORTABLE CHEST 1 VIEW COMPARISON:  Chest x-ray 10/30/2021 FINDINGS: The cardiac silhouette, mediastinal and  hilar contours are within normal limits and stable. Underlying emphysematous changes and pulmonary scarring. Right hilar and infrahilar density appears stable and is consistent with known treated right lung cancer. No obvious superimposed infiltrate or effusion. IMPRESSION: 1. Chronic right hilar and infrahilar density consistent with known treated right lung cancer. 2. Underlying emphysematous changes and pulmonary scarring. Electronically Signed   By: Rudie Meyer M.D.   On: 02/12/2022 11:58         PROCEDURES:  Critical Care performed: Yes, see critical care procedure note(s)  CRITICAL CARE Performed by: Sharyn Creamer   Total critical care time: 30 minutes  Critical care time was exclusive of separately billable procedures and treating other patients.  Critical care was necessary to treat or prevent imminent or life-threatening deterioration.  Critical care was time spent personally by me on the following activities: development of treatment plan with patient and/or surrogate as well as nursing, discussions with consultants, evaluation of patient's response to treatment, examination of  patient, obtaining history from patient or surrogate, ordering and performing treatments and interventions, ordering and review of laboratory studies, ordering and review of radiographic studies, pulse oximetry and re-evaluation of patient's condition.   Procedures   MEDICATIONS ORDERED IN ED: Medications  lactated ringers infusion (has no administration in time range)  ceFEPIme (MAXIPIME) 2 g in sodium chloride 0.9 % 100 mL IVPB (2 g Intravenous New Bag/Given 02/12/22 1410)  vancomycin (VANCOREADY) IVPB 750 mg/150 mL (has no administration in time range)  potassium chloride 10 mEq in 100 mL IVPB (has no administration in time range)  albuterol (PROVENTIL) (2.5 MG/3ML) 0.083% nebulizer solution 2.5 mg (has no administration in time range)  dextromethorphan-guaiFENesin (MUCINEX DM) 30-600 MG per 12 hr tablet 1 tablet (has no administration in time range)  lactated ringers bolus 1,000 mL (1,000 mLs Intravenous New Bag/Given 02/12/22 1157)  ipratropium-albuterol (DUONEB) 0.5-2.5 (3) MG/3ML nebulizer solution 3 mL (3 mLs Nebulization Given 02/12/22 1154)  iohexol (OMNIPAQUE) 350 MG/ML injection 75 mL (75 mLs Intravenous Contrast Given 02/12/22 1252)  ipratropium-albuterol (DUONEB) 0.5-2.5 (3) MG/3ML nebulizer solution 3 mL (3 mLs Nebulization Given 02/12/22 1410)     IMPRESSION / MDM / ASSESSMENT AND PLAN / ED COURSE  I reviewed the triage vital signs and the nursing notes.                              Differential diagnosis includes, but is not limited to, pneumonia, influenza, COVID, viral syndrome, COPD exacerbation though she is not acutely wheezing, pulmonary embolism, ACS, volume overload etc.  Based on her report of fevers chills body aches cough and presentation I am most suspicious for an infectious cause.  Other causation though do need to be excluded and ruled out.  Will start with DuoNeb treatment, though not explicitly wheezing she does carry a history of COPD which may be contributory  though I suspect this is likely not in acute exacerbation based on her relatively comfortable breathing pattern but noted hypoxia without wheezing  Labs notable for leukocytosis.  White count of 12,000.  Additionally elevated BUN of 41.  Mildly elevated anion gap at 16. (Drawn at 8:50 AM)  Patient's presentation is most consistent with acute presentation with potential threat to life or bodily function.   The patient is on the cardiac monitor to evaluate for evidence of arrhythmia and/or significant heart rate changes.  ----------------------------------------- 1:51 PM on 02/12/2022 ----------------------------------------- Upon review of CT scan concern for  multiple potential causes but infectious etiologies are now high including possible bacterial pneumonia.  Code sepsis initiated, broad-spectrum antibiotics.  Lactic acid has improved with fluids.  Work of breathing is comfortable though she does still have a 2 L oxygen requirement.  She is awake and alert.  Started on broad-spectrum antibiotics, reviewed her drug allergies and high concern for structural lung disease in the setting of this patient who is actively being treated for cancer as well.  Will admit for further care and treatment, overall appears to be improving with regard to her work of breathing and heart rate.  Consulted with and discussed case with our hospitalist Dr. Clyde Lundborg Who is accepting of admission       FINAL CLINICAL IMPRESSION(S) / ED DIAGNOSES   Final diagnoses:  Shortness of breath  Pneumonia of both lungs due to infectious organism, unspecified part of lung  Sepsis, due to unspecified organism, unspecified whether acute organ dysfunction present (HCC)  Acute respiratory failure with hypoxia (HCC)  Long QT interval  Hypokalemia     Rx / DC Orders   ED Discharge Orders     None        Note:  This document was prepared using Dragon voice recognition software and may include unintentional dictation  errors.   Sharyn Creamer, MD 02/12/22 [phone]

## 2022-02-12 NOTE — Progress Notes (Signed)
CODE SEPSIS - PHARMACY COMMUNICATION  **Broad Spectrum Antibiotics should be administered within 1 hour of Sepsis diagnosis**  Time Code Sepsis Called/Page Received: 1350  Antibiotics Ordered: vancomycin, cefepime     Time of 1st antibiotic administration: 1410  Additional action taken by pharmacy: N/A    Manfred Shirts ,PharmD Clinical Pharmacist  02/12/2022  2:52 PM

## 2022-02-12 NOTE — ED Notes (Signed)
Provider notified of lactic 2.5

## 2022-02-12 NOTE — Progress Notes (Addendum)
Upon today's assessment, pt stated that she had a fall out of her bed Saturday night around 1am. She stated that her legs "wouldn't work", and she laid on the floor for 6 hours, until she was able to pull herself up off the floor. She complains of generalized soreness, but does not think she she sustained any injuries. She is able to ambulate, but states that she is slow and feels weak. Pt also complains of cough, shortness of breath, and poor appetite. She states " I just don't feel well". VS: t-101, p-139, R-20, BP 109/83, O2 80%. Primary care team was informed of changes and pt will be added to [facility] today to be seen by NP. O2 sats increased to 92% after applying 2L of O2.  1030- Per L. Freida Busman, NP, pt needs to be transferred to ED. Patient's son notified, and pt escorted to ED via wheelchair with 2L of O2. Port remains accessed. Pt did not receive treatment today.

## 2022-02-12 NOTE — ED Triage Notes (Signed)
Pt presents to the ED via Cx center due to abnormal vitals signs. Pt states she had a fever and low oxygen at the cx center. Pt states she doesn't feel well. Pt was going for treatment. Pt's port was accessed by cx staff. Pt A&Ox4

## 2022-02-12 NOTE — Consult Note (Addendum)
Pharmacy Antibiotic Note  Carol Shaffer is a 77 y.o. female admitted on 02/12/2022 with pneumonia.  Pharmacy has been consulted for vancomycin and cefepime dosing.  Assessment: 77 yo F presents to the ED after cough, fatigue and SOB developed over the last 3 or so days. Went to oncology appointment today and her oxygen level was low and referred to the ER    WBC 12.1, Tmax 101, Scr 0.74  Plan: Vancomycin 750 mg IV x 1, then Vancomycin 500 mg IV Q 36 hrs. Goal AUC 400-550. Expected AUC: 529.2, Cmin 12.8 SCr used: 0.8   Cefepime 2 gm IV Q24H  Height: 5' (152.4 cm) Weight: 30.5 kg (67 lb 3.8 oz) IBW/kg (Calculated) : 45.5  Temp (24hrs), Avg:99.6 F (37.6 C), Min:98.9 F (37.2 C), Max:101 F (38.3 C)  Recent Labs  Lab 02/12/22 0850 02/12/22 1108 02/12/22 1132  WBC 12.1* 11.6*  --   CREATININE 0.84 0.74  --   LATICACIDVEN  --  2.5* 1.0    Estimated Creatinine Clearance: 29.3 mL/min (by C-G formula based on SCr of 0.74 mg/dL).    Allergies  Allergen Reactions   Hydromorphone Hcl Itching   Bentyl [Dicyclomine] Nausea And Vomiting   Biaxin [Clarithromycin] Other (See Comments)    hallucinations   Budesonide Other (See Comments)    Ulcer   Erythromycin Nausea And Vomiting   Reglan [Metoclopramide] Other (See Comments)    tremors    Antimicrobials this admission: Vancomycin 1/15 >>  cefepime 1/15 >>   Dose adjustments this admission: N/A  Microbiology results: 1/15 BCx: pending 1/15 UCx: pending  1/15 Sputum: ordered  1/15 MRSA PCR: ordered  Thank you for allowing pharmacy to be a part of this patient's care.  Manfred Shirts 02/12/2022 2:55 PM

## 2022-02-12 NOTE — Progress Notes (Signed)
RT to patient bedside for Bipap placement. Patient found resting comfortably in bed, no distress, but SATs 88% on 6L [place]. Patient states she has been coughing up lots of tan/brown and does not wish to go on Bipap. Patient started on high flow [place] 12L at this time, tolerating well. SATs improved to 94%. Family member at bedside.

## 2022-02-12 NOTE — Sepsis Progress Note (Signed)
Sepsis protocol is being followed by [facility]. 

## 2022-02-13 ENCOUNTER — Other Ambulatory Visit: Payer: Self-pay

## 2022-02-13 DIAGNOSIS — R000 Tachycardia, unspecified: Secondary | ICD-10-CM | POA: Diagnosis present

## 2022-02-13 DIAGNOSIS — J189 Pneumonia, unspecified organism: Secondary | ICD-10-CM | POA: Diagnosis not present

## 2022-02-13 LAB — BASIC METABOLIC PANEL
Anion gap: 8 (ref 5–15)
BUN: 22 mg/dL (ref 8–23)
CO2: 25 mmol/L (ref 22–32)
Calcium: 8 mg/dL — ABNORMAL LOW (ref 8.9–10.3)
Chloride: 103 mmol/L (ref 98–111)
Creatinine, Ser: 0.45 mg/dL (ref 0.44–1.00)
GFR, Estimated: >60 mL/min (ref >60)
Glucose, Bld: 122 mg/dL — ABNORMAL HIGH (ref 70–99)
Potassium: 3.4 mmol/L — ABNORMAL LOW (ref 3.5–5.1)
Sodium: 136 mmol/L (ref 135–145)

## 2022-02-13 LAB — CBC
HCT: 29.7 % — ABNORMAL LOW (ref 36.0–46.0)
Hemoglobin: 9.4 g/dL — ABNORMAL LOW (ref 12.0–15.0)
MCH: 30.6 pg (ref 26.0–34.0)
MCHC: 31.6 g/dL (ref 30.0–36.0)
MCV: 96.7 fL (ref 80.0–100.0)
Platelets: 181 10*3/uL (ref 150–400)
RBC: 3.07 MIL/uL — ABNORMAL LOW (ref 3.87–5.11)
RDW: 21.6 % — ABNORMAL HIGH (ref 11.5–15.5)
WBC: 7.9 10*3/uL (ref 4.0–10.5)
nRBC: 0 % (ref 0.0–0.2)

## 2022-02-13 LAB — T4: T4, Total: 7.5 ug/dL (ref 4.5–12.0)

## 2022-02-13 LAB — MAGNESIUM: Magnesium: 1.3 mg/dL — ABNORMAL LOW (ref 1.7–2.4)

## 2022-02-13 MED ORDER — MORPHINE SULFATE (PF) 2 MG/ML IV SOLN
2.0000 mg | INTRAVENOUS | Status: DC | PRN
  Administered 2022-02-13 – 2022-02-20 (×3): 2 mg via INTRAVENOUS
  Filled 2022-02-13 (×3): qty 1

## 2022-02-13 MED ORDER — DM-GUAIFENESIN ER 30-600 MG PO TB12
1.0000 | ORAL_TABLET | Freq: Two times a day (BID) | ORAL | Status: DC
  Administered 2022-02-13 – 2022-02-20 (×14): 1 via ORAL
  Filled 2022-02-13 (×15): qty 1

## 2022-02-13 MED ORDER — CHLORHEXIDINE GLUCONATE CLOTH 2 % EX PADS
6.0000 | MEDICATED_PAD | Freq: Every day | CUTANEOUS | Status: DC
  Administered 2022-02-13 – 2022-02-20 (×7): 6 via TOPICAL

## 2022-02-13 MED ORDER — MAGNESIUM SULFATE 4 GM/100ML IV SOLN
4.0000 g | Freq: Once | INTRAVENOUS | Status: AC
  Administered 2022-02-13: 4 g via INTRAVENOUS
  Filled 2022-02-13: qty 100

## 2022-02-13 MED ORDER — HYDROCODONE-ACETAMINOPHEN 5-325 MG PO TABS
1.0000 | ORAL_TABLET | Freq: Four times a day (QID) | ORAL | Status: DC | PRN
  Administered 2022-02-13 – 2022-02-20 (×22): 1 via ORAL
  Filled 2022-02-13 (×22): qty 1

## 2022-02-13 MED ORDER — METOPROLOL TARTRATE 25 MG PO TABS
25.0000 mg | ORAL_TABLET | Freq: Two times a day (BID) | ORAL | Status: DC
  Administered 2022-02-13: 25 mg via ORAL
  Filled 2022-02-13: qty 1

## 2022-02-13 MED ORDER — DILTIAZEM HCL 30 MG PO TABS
60.0000 mg | ORAL_TABLET | Freq: Four times a day (QID) | ORAL | Status: DC | PRN
  Administered 2022-02-13 – 2022-02-15 (×3): 60 mg via ORAL
  Filled 2022-02-13 (×5): qty 2

## 2022-02-13 MED ORDER — IPRATROPIUM-ALBUTEROL 0.5-2.5 (3) MG/3ML IN SOLN
3.0000 mL | Freq: Four times a day (QID) | RESPIRATORY_TRACT | Status: DC
  Administered 2022-02-14 – 2022-02-20 (×27): 3 mL via RESPIRATORY_TRACT
  Filled 2022-02-13 (×25): qty 3

## 2022-02-13 MED ORDER — POTASSIUM CHLORIDE 10 MEQ/100ML IV SOLN
10.0000 meq | INTRAVENOUS | Status: AC
  Administered 2022-02-13 (×2): 10 meq via INTRAVENOUS
  Filled 2022-02-13 (×2): qty 100

## 2022-02-13 NOTE — Assessment & Plan Note (Addendum)
S/p of right lower lobe lobectomy.  Patient is on radiation and chemotherapy, last chemo was 01/08/2022

## 2022-02-13 NOTE — Assessment & Plan Note (Addendum)
Bronchodilators.  Steroids started on 1/20.  Today is day 4 for Solu-Medrol will switch over to prednisone taper for tomorrow.  (If the patient does complain of mouth pain and not eating as likely secondary to the steroids and then steroids can be stopped).

## 2022-02-13 NOTE — Plan of Care (Signed)
  Problem: Education: Goal: Knowledge of disease or condition will improve 02/13/2022 2343 by Sheria Lang, RN Outcome: Progressing 02/13/2022 2343 by Sheria Lang, RN Outcome: Progressing Goal: Knowledge of the prescribed therapeutic regimen will improve 02/13/2022 2343 by Sheria Lang, RN Outcome: Progressing 02/13/2022 2343 by Sheria Lang, RN Outcome: Progressing Goal: Individualized Educational Video(s) 02/13/2022 2343 by Sheria Lang, RN Outcome: Progressing 02/13/2022 2343 by Sheria Lang, RN Outcome: Progressing   Problem: Education: Goal: Knowledge of the prescribed therapeutic regimen will improve 02/13/2022 2343 by Sheria Lang, RN Outcome: Progressing 02/13/2022 2343 by Sheria Lang, RN Outcome: Progressing   Problem: Education: Goal: Individualized Educational Video(s) 02/13/2022 2343 by Sheria Lang, RN Outcome: Progressing 02/13/2022 2343 by Sheria Lang, RN Outcome: Progressing   Problem: Activity: Goal: Ability to tolerate increased activity will improve 02/13/2022 2343 by Sheria Lang, RN Outcome: Progressing 02/13/2022 2343 by Sheria Lang, RN Outcome: Progressing Goal: Will verbalize the importance of balancing activity with adequate rest periods 02/13/2022 2343 by Sheria Lang, RN Outcome: Progressing 02/13/2022 2343 by Sheria Lang, RN Outcome: Progressing   Problem: Respiratory: Goal: Ability to maintain a clear airway will improve 02/13/2022 2343 by Sheria Lang, RN Outcome: Progressing 02/13/2022 2343 by Sheria Lang, RN Outcome: Progressing Goal: Levels of oxygenation will improve 02/13/2022 2343 by Sheria Lang, RN Outcome: Progressing 02/13/2022 2343 by Sheria Lang, RN Outcome: Progressing Goal: Ability to maintain adequate ventilation will improve 02/13/2022 2343 by Sheria Lang, RN Outcome: Progressing 02/13/2022 2343 by Sheria Lang, RN Outcome: Progressing

## 2022-02-13 NOTE — Hospital Course (Addendum)
HPI on admission per Dr. Clyde Lundborg: "Carol Shaffer is a 77 y.o. female with medical history significant of NSCLC (s/p of right lower lobe lobectomy, radiation and chemotherapy), COPD, former smoker, stroke, GERD, depression, who presents with fever, SOB.   Patient states that she has fever, chills, cough, shortness breath for more than 3 days, which has been progressively worsening.  Patient has cough with brownish colored sputum production.  Patient normally is not using oxygen, but was found to have oxygen desaturation to 83% on room air, which initally improved to 92-93% on 4 L oxygen in ED, but deteriorated with worsening shortness of breath, using accessory muscle for breathing, started BiPAP in ED. Patient does not have nausea, vomiting, diarrhea or abdominal pain.  No symptoms of UTI.  She states that she had mild wheezing at home, which has resolved.   Data reviewed independently and ED Course: pt was found to have WBC 11.6, negative PCR for COVID and flu, potassium 3.0, GFR> 60, temperature 101, blood pressure 118/97, heart rate 130s, RR 23.  Patient is admitted to PCU as inpatient."  Imaging included CXR and CTA chest which was negative for PE but showed extensive multifocal pneumonia.   1/16: Pt remains very dyspneic with borderline O2 sats on 10-15 L HFNC oxygen.  Sinus tachycardia related to her respiratory issues.  MRSA screen negative, stopped Vancomycin.  Continuing Cefepime. 1/17.  Patient on 10 L high flow nasal cannula. 1/18.  Patient on 13 L high flow nasal cannula this afternoon 1/19.  Patient on 11 L high flow nasal cannula this morning.  Added Tussionex for cough. 1/20.  Patient on 100% nonrebreather this morning but this afternoon on heated high flow nasal cannula 100% FiO2.  Patient agreeable to a dose of Lasix and a dose of Solu-Medrol. 1/21.  Taper down to 80% heated high flow nasal cannula this morning.  Continue Solu-Medrol. 1/22.  Down to 60% heated high flow nasal cannula 35  L flow today.  Continuing Solu-Medrol. 1/23.  Ranging between 49% and 57% FiO2 on heated high flow nasal cannula with 35 L flow.  Patient was accepted to [facility] this afternoon and has accepted the bed.  The patient does not wear oxygen at home and her goal would be to go home.  Replacing electrolytes during the hospital course.

## 2022-02-13 NOTE — Assessment & Plan Note (Addendum)
Continue oral magnesium.  Recommend checking magnesium tomorrow.

## 2022-02-13 NOTE — Assessment & Plan Note (Addendum)
Replaced in normal range.  Continue supplementation.  Recommend checking potassium tomorrow.

## 2022-02-13 NOTE — ED Notes (Signed)
Patient seen.

## 2022-02-13 NOTE — Assessment & Plan Note (Addendum)
With multifocal pneumonia.  Patient with worsening oxygenation on 1/20 heated high flow nasal cannula 100% oxygen and 45 L.  On 1/21 Taper down to 80% FiO2.  On 1/22 Taper down to 60% FiO2.  On 1/23 ranging between 49 and 57% FiO2 on heated high flow nasal cannula 35 L flow.  The patient does not wear oxygen at home.

## 2022-02-13 NOTE — Assessment & Plan Note (Signed)
As above

## 2022-02-13 NOTE — Assessment & Plan Note (Signed)
Continue Protonix, Carafate

## 2022-02-13 NOTE — Assessment & Plan Note (Signed)
- 

## 2022-02-13 NOTE — Progress Notes (Addendum)
Progress Note   Patient: Carol Shaffer YUW:[id number] DOB: 1945-11-19 DOA: 02/12/2022     1 DOS: the patient was seen and examined on 02/13/2022   Brief hospital course: HPI on admission per Dr. Clyde Lundborg: "[name] is a 77 y.o. female with medical history significant of NSCLC (s/p of right lower lobe lobectomy, radiation and chemotherapy), COPD, former smoker, stroke, GERD, depression, who presents with fever, SOB.   Patient states that she has fever, chills, cough, shortness breath for more than 3 days, which has been progressively worsening.  Patient has cough with brownish colored sputum production.  Patient normally is not using oxygen, but was found to have oxygen desaturation to 83% on room air, which initally improved to 92-93% on 4 L oxygen in ED, but deteriorated with worsening shortness of breath, using accessory muscle for breathing, started BiPAP in ED. Patient does not have nausea, vomiting, diarrhea or abdominal pain.  No symptoms of UTI.  She states that she had mild wheezing at home, which has resolved.   Data reviewed independently and ED Course: pt was found to have WBC 11.6, negative PCR for COVID and flu, potassium 3.0, GFR> 60, temperature 101, blood pressure 118/97, heart rate 130s, RR 23.  Patient is admitted to PCU as inpatient."  Imaging included CXR and CTA chest which was negative for PE but showed extensive multifocal pneumonia.   1/16: Pt remains very dyspneic with borderline O2 sats on 10-15 L HFNC oxygen.  Sinus tachycardia related to her respiratory issues.  MRSA screen negative, stopped Vancomycin.  Continuing Cefepime.  Assessment and Plan: * HCAP (healthcare-associated pneumonia) Severe Sepsis - POA with tachycardia, tachypnea, fever and lactic acidosis. 1/16 - remains tachycardic, on 10-15 L/min HFNC O2 --Continue Cefepime --Stop vancomycin, MRSA negative --Scheduled Mucines --Scheduled & PRN bronchodilators --Stop IV fluids, lactate  normalized --Incentive spirometer & flutter valve --Follow sputum and blood cultures  Sinus tachycardia Due to acute respiratory illness and hypoxia, sepsis. No improvement with IV Lopressor pushes. Start PO Cardizem 60 mg Q6H PRN for HR"s > 110 bpm Avoid beta blockers given underlying COPD at risk of exacerbation. Treat respiratory issues as outlined.  Acute respiratory failure with hypoxia (HCC) Due to multifocal pneumonia. Requiring 10-15 L/min HFNC oxygen. She declined BiPAP due to her productive cough.  --Supplement O2 & wean as tolerated --Goal O2 sats 88-93% --Mgmt of pneumonia as outlined --Incentive spirometry  Severe sepsis (HCC) As above  COPD (chronic obstructive pulmonary disease) (HCC) Bronchodilators. Consider steroids if shows signs of exacerbation with PNA.  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  Patient is on radiation and chemotherapy, last chemo was 01/08/2022 -Follow-up with oncology  Depression Continue home meds  Hypomagnesemia Mg 1.3 this AM, giving 4 g IV replacement Monitor Mg & replace PRN  Hypokalemia POA, replacement given on admission. K 3.4 this AM, will give further replacement. Monitor BMP, Mg level & replace PRN  Gastric ulcer Continue Protonix, Carafate  Protein-calorie malnutrition, severe (HCC) Body weight 30.5 kg, BMI 13.13 -Ensure -Nutrition consult          Subjective: Pt seen int he ED holding for a bed.  She continues to be very short of breath and productive cough.  She's having pain related to all the coughing, not relieved by Tylenol.  Otherwise no acute complaints.   Physical Exam: Vitals:   02/13/22 1300 02/13/22 1330 02/13/22 1337 02/13/22 1420  BP: 125/63 129/71 129/71  Pulse: (!) 126 (!) 126 (!) 128   Resp: 19 16    Temp:    98.5 F (36.9 C)  TempSrc:    Oral  SpO2: 90% 93%    Weight:      Height:       General exam: awake, alert, no acute  distress, frail HEENT: wearing HFNC, moist mucus membranes, hearing grossly normal  Respiratory system: Diffuse rhonchi, increased respiratory effort at rest with accessory muscle use, on 10 L/min HFNC O2. Cardiovascular system: normal S1/S2, tachycardic, regular rhythem, no pedal edema.   Gastrointestinal system: soft, NT, ND, no HSM felt, +bowel sounds. Central nervous system: A&O x4. no gross focal neurologic deficits, normal speech Extremities: moves all , no edema, normal tone Skin: dry, intact, normal temperature, normal color, No rashes, lesions or ulcers Psychiatry: normal mood, congruent affect, judgement and insight appear normal   Data Reviewed:  Notable labs --- K 3.4, glucose 122, Ca 8.0, Mg 1.3, Hbg 9.4   Family Communication: None at bedside, will attempt to call  Disposition: Status is: Inpatient Remains inpatient appropriate because: Remains on with high O2 requirements and on IV antibiotics with persistent sepsis physiology with tachycardia and tachypnea   Planned Discharge Destination: Home    Time spent: 40 minutes  Author: Pennie Banter, DO 02/13/2022 3:32 PM  For on call review [email or web address].

## 2022-02-13 NOTE — Assessment & Plan Note (Addendum)
Severe Sepsis - POA with tachycardia, tachypnea, fever and lactic acidosis.  Patient also has acute hypoxic respiratory failure. --Completed Rocephin and continue doxycycline 7-day course (3 more doses) --Incentive spirometer

## 2022-02-13 NOTE — Progress Notes (Signed)
  Chaplain On-Call responded to Spiritual Care Consult Order from Lasandra Beech, RN.  Chaplain met the patient in ED room 13, and provided the Advance Directives documents and education for her.  The patient stated that she will discuss further with her son. Chaplain explained process for completion of the documents in the hospital if she chooses.  Chaplain Evelena Peat M.Div., [facility]

## 2022-02-13 NOTE — Assessment & Plan Note (Addendum)
Underweight with a BMI of 13.13

## 2022-02-13 NOTE — Assessment & Plan Note (Addendum)
Standing dose p.o. Cardizem every 6 hours.

## 2022-02-14 DIAGNOSIS — J9601 Acute respiratory failure with hypoxia: Secondary | ICD-10-CM

## 2022-02-14 DIAGNOSIS — A419 Sepsis, unspecified organism: Secondary | ICD-10-CM | POA: Diagnosis not present

## 2022-02-14 DIAGNOSIS — B9620 Unspecified Escherichia coli [E. coli] as the cause of diseases classified elsewhere: Secondary | ICD-10-CM | POA: Insufficient documentation

## 2022-02-14 DIAGNOSIS — J189 Pneumonia, unspecified organism: Secondary | ICD-10-CM | POA: Diagnosis not present

## 2022-02-14 DIAGNOSIS — R000 Tachycardia, unspecified: Secondary | ICD-10-CM

## 2022-02-14 DIAGNOSIS — J449 Chronic obstructive pulmonary disease, unspecified: Secondary | ICD-10-CM | POA: Diagnosis not present

## 2022-02-14 LAB — CBC
HCT: 29.9 % — ABNORMAL LOW (ref 36.0–46.0)
Hemoglobin: 9.4 g/dL — ABNORMAL LOW (ref 12.0–15.0)
MCH: 30.9 pg (ref 26.0–34.0)
MCHC: 31.4 g/dL (ref 30.0–36.0)
MCV: 98.4 fL (ref 80.0–100.0)
Platelets: 195 10*3/uL (ref 150–400)
RBC: 3.04 MIL/uL — ABNORMAL LOW (ref 3.87–5.11)
RDW: 20.7 % — ABNORMAL HIGH (ref 11.5–15.5)
WBC: 7 10*3/uL (ref 4.0–10.5)
nRBC: 0 % (ref 0.0–0.2)

## 2022-02-14 LAB — BASIC METABOLIC PANEL
Anion gap: 7 (ref 5–15)
BUN: 12 mg/dL (ref 8–23)
CO2: 28 mmol/L (ref 22–32)
Calcium: 8 mg/dL — ABNORMAL LOW (ref 8.9–10.3)
Chloride: 101 mmol/L (ref 98–111)
Creatinine, Ser: 0.39 mg/dL — ABNORMAL LOW (ref 0.44–1.00)
GFR, Estimated: >60 mL/min (ref >60)
Glucose, Bld: 93 mg/dL (ref 70–99)
Potassium: 3.8 mmol/L (ref 3.5–5.1)
Sodium: 136 mmol/L (ref 135–145)

## 2022-02-14 LAB — URINE CULTURE: Culture: >=100000 — AB

## 2022-02-14 LAB — MAGNESIUM: Magnesium: 1.6 mg/dL — ABNORMAL LOW (ref 1.7–2.4)

## 2022-02-14 MED ORDER — MAGNESIUM SULFATE 2 GM/50ML IV SOLN
2.0000 g | Freq: Once | INTRAVENOUS | Status: AC
  Administered 2022-02-14: 2 g via INTRAVENOUS
  Filled 2022-02-14: qty 50

## 2022-02-14 MED ORDER — DOXYCYCLINE HYCLATE 100 MG PO TABS
100.0000 mg | ORAL_TABLET | Freq: Two times a day (BID) | ORAL | Status: DC
  Administered 2022-02-14 – 2022-02-20 (×12): 100 mg via ORAL
  Filled 2022-02-14 (×12): qty 1

## 2022-02-14 NOTE — Progress Notes (Signed)
Pharmacy Antibiotic Note  Carol Shaffer is a 77 y.o. female admitted on 02/12/2022 with pneumonia.  Pharmacy has been consulted for vancomycin and cefepime dosing.  Assessment: 77 yo F presents to the ED after cough, fatigue and SOB developed over the last 3 or so days. Went to oncology appointment today and her oxygen level was low and referred to the ER .  Day#3 of antibiotics. Scr improved, Tmax 100.9, WBC down (12.1->>7.0). Vancomycin discontinued 1/16  Plan:  Continue Cefepime 2 gm IV Q24H  Height: 5' (152.4 cm) Weight: 30.5 kg (67 lb 3.8 oz) IBW/kg (Calculated) : 45.5  Temp (24hrs), Avg:99.1 F (37.3 C), Min:98.3 F (36.8 C), Max:100.9 F (38.3 C)  Recent Labs  Lab 02/12/22 0850 02/12/22 1108 02/12/22 1132 02/13/22 0500 02/14/22 0515  WBC 12.1* 11.6*  --  7.9 7.0  CREATININE 0.84 0.74  --  0.45 0.39*  LATICACIDVEN  --  2.5* 1.0  --   --      Estimated Creatinine Clearance: 29.3 mL/min (A) (by C-G formula based on SCr of 0.39 mg/dL (L)).    Allergies  Allergen Reactions   Hydromorphone Hcl Itching   Bentyl [Dicyclomine] Nausea And Vomiting   Biaxin [Clarithromycin] Other (See Comments)    hallucinations   Budesonide Other (See Comments)    Ulcer   Erythromycin Nausea And Vomiting   Reglan [Metoclopramide] Other (See Comments)    tremors    Antimicrobials this admission: Vancomycin 1/15 >> 1/16 cefepime 1/15 >>   Microbiology results: 1/15 BCx: NG x 2D 1/15 UCx: E/coli 1/15 MRSA PCR: NOT DETECTED  Thank you for allowing pharmacy to be a part of this patient's care.  Temprence Rhines Rodriguez-Guzman PharmD, BCPS 02/14/2022 10:52 AM

## 2022-02-14 NOTE — Evaluation (Signed)
Physical Therapy Evaluation Patient Details Name: Carol Shaffer MRN: [id number] DOB: 23-Jan-1946 Today's Date: 02/14/2022  History of Present Illness  Carol Shaffer is a 77 y.o. female with medical history significant of NSCLC (s/p of right lower lobe lobectomy, radiation and chemotherapy), COPD, former smoker, stroke, GERD, depression, who presents with fever, SOB.  Clinical Impression  Patient received in bed, she is very motivated to get moving and work with PT. Patient required min A for bed mobility and min A for transfers and ambulation of 5 feet with RW. O2 sats down to 82% on 11 liters HFNC, she was able to increase sats to 92% once in recliner with PLB.  Patient will continue to benefit from skilled PT to improve strength and functional independence.      Recommendations for follow up therapy are one component of a multi-disciplinary discharge planning process, led by the attending physician.  Recommendations may be updated based on patient status, additional functional criteria and insurance authorization.  Follow Up Recommendations Skilled nursing-short term rehab (<3 hours/day) Can patient physically be transported by private vehicle: No    Assistance Recommended at Discharge Frequent or constant Supervision/Assistance  Patient can return home with the following  A little help with walking and/or transfers;A little help with bathing/dressing/bathroom;Help with stairs or ramp for entrance;Assist for transportation;Assistance with cooking/housework    Equipment Recommendations None recommended by PT;Other (comment) (TBD)  Recommendations for Other Services       Functional Status Assessment Patient has had a recent decline in their functional status and demonstrates the ability to make significant improvements in function in a reasonable and predictable amount of time.     Precautions / Restrictions Precautions Precautions: Fall Restrictions Weight Bearing Restrictions: No       Mobility  Bed Mobility Overal bed mobility: Needs Assistance Bed Mobility: Supine to Sit     Supine to sit: Min assist          Transfers Overall transfer level: Needs assistance Equipment used: Rolling walker (2 wheels) Transfers: Sit to/from Stand Sit to Stand: Min assist                Ambulation/Gait Ambulation/Gait assistance: Min Chemical engineer (Feet): 5 Feet Assistive device: Rolling walker (2 wheels) Gait Pattern/deviations: Step-through pattern, Decreased step length - right, Decreased step length - left, Decreased stride length Gait velocity: decr     General Gait Details: patient ambulated to recliner from bed. Min A. O2 sats down to 82% on 11 liters HFNC. Recovered to 92% in recliner with PLB.  Stairs            Wheelchair Mobility    Modified Rankin (Stroke Patients Only)       Balance Overall balance assessment: Needs assistance Sitting-balance support: Feet supported Sitting balance-Leahy Scale: Fair     Standing balance support: Bilateral upper extremity supported, During functional activity, Reliant on assistive device for balance Standing balance-Leahy Scale: Fair                               Pertinent Vitals/Pain Pain Assessment Pain Assessment: No/denies pain    Home Living Family/patient expects to be discharged to:: Private residence Living Arrangements: Alone Available Help at Discharge: Family;Available PRN/intermittently Type of Home: House           Home Equipment: Rolling Walker (2 wheels)      Prior Function Prior Level of Function : Independent/Modified Independent  Mobility Comments: does not drive, son is close by. Uses walker for mobility ADLs Comments: mod I     Hand Dominance        Extremity/Trunk Assessment   Upper Extremity Assessment Upper Extremity Assessment: Generalized weakness    Lower Extremity Assessment Lower Extremity Assessment:  Generalized weakness    Cervical / Trunk Assessment Cervical / Trunk Assessment: Normal  Communication   Communication: No difficulties  Cognition Arousal/Alertness: Awake/alert Behavior During Therapy: WFL for tasks assessed/performed Overall Cognitive Status: Within Functional Limits for tasks assessed                                 General Comments: patient very motivated to improve and work with therapy        General Comments      Exercises     Assessment/Plan    PT Assessment Patient needs continued PT services  PT Problem List Decreased strength;Decreased activity tolerance;Decreased balance;Decreased mobility;Cardiopulmonary status limiting activity       PT Treatment Interventions DME instruction;Therapeutic exercise;Gait training;Balance training;Functional mobility training;Therapeutic activities;Patient/family education;Neuromuscular re-education    PT Goals (Current goals can be found in the Care Plan section)  Acute Rehab PT Goals Patient Stated Goal: to return home PT Goal Formulation: With patient Time For Goal Achievement: 02/28/22 Potential to Achieve Goals: Fair    Frequency Min 2X/week     Co-evaluation               AM-PAC PT "6 Clicks" Mobility  Outcome Measure Help needed turning from your back to your side while in a flat bed without using bedrails?: A Little Help needed moving from lying on your back to sitting on the side of a flat bed without using bedrails?: A Little Help needed moving to and from a bed to a chair (including a wheelchair)?: A Little Help needed standing up from a chair using your arms (e.g., wheelchair or bedside chair)?: A Little Help needed to walk in hospital room?: A Little Help needed climbing 3-5 steps with a railing? : A Lot 6 Click Score: 17    End of Session Equipment Utilized During Treatment: Gait belt;Oxygen Activity Tolerance: Patient tolerated treatment well;Other (comment) (limited  by O2 sats and needs) Patient left: in chair;with call bell/phone within reach;with family/visitor present Nurse Communication: Mobility status;Other (comment) (patient's bed wet) PT Visit Diagnosis: Unsteadiness on feet (R26.81);Other abnormalities of gait and mobility (R26.89);Muscle weakness (generalized) (M62.81);Difficulty in walking, not elsewhere classified (R26.2)    Time: 4734-0370 PT Time Calculation (min) (ACUTE ONLY): 21 min   Charges:   PT Evaluation $PT Eval Moderate Complexity: 1 Mod PT Treatments $Gait Training: 8-22 mins        Erskine Steinfeldt, PT, GCS 02/14/22,4:31 PM

## 2022-02-14 NOTE — Assessment & Plan Note (Addendum)
Last hemoglobin 8.7.

## 2022-02-14 NOTE — Progress Notes (Signed)
   02/14/22 1000  Assess: MEWS Score  Pulse Rate (!) 105  ECG Heart Rate (!) 106  Resp 19  SpO2 95 %  Assess: MEWS Score  MEWS Temp 0  MEWS Systolic 0  MEWS Pulse 1  MEWS RR 0  MEWS LOC 0  MEWS Score 1  MEWS Score Color Green  Assess: SIRS CRITERIA  SIRS Temperature  0  SIRS Pulse 1  SIRS Respirations  0  SIRS WBC 0  SIRS Score Sum  1

## 2022-02-14 NOTE — Progress Notes (Addendum)
Progress Note   Patient: Carol Shaffer:[id number] DOB: 06-Feb-1945 DOA: 02/12/2022     2 DOS: the patient was seen and examined on 02/14/2022   Brief hospital course: HPI on admission per Dr. Clyde Lundborg: "Carol Shaffer is a 77 y.o. female with medical history significant of NSCLC (s/p of right lower lobe lobectomy, radiation and chemotherapy), COPD, former smoker, stroke, GERD, depression, who presents with fever, SOB.   Patient states that she has fever, chills, cough, shortness breath for more than 3 days, which has been progressively worsening.  Patient has cough with brownish colored sputum production.  Patient normally is not using oxygen, but was found to have oxygen desaturation to 83% on room air, which initally improved to 92-93% on 4 L oxygen in ED, but deteriorated with worsening shortness of breath, using accessory muscle for breathing, started BiPAP in ED. Patient does not have nausea, vomiting, diarrhea or abdominal pain.  No symptoms of UTI.  She states that she had mild wheezing at home, which has resolved.   Data reviewed independently and ED Course: pt was found to have WBC 11.6, negative PCR for COVID and flu, potassium 3.0, GFR> 60, temperature 101, blood pressure 118/97, heart rate 130s, RR 23.  Patient is admitted to PCU as inpatient."  Imaging included CXR and CTA chest which was negative for PE but showed extensive multifocal pneumonia.   1/16: Pt remains very dyspneic with borderline O2 sats on 10-15 L HFNC oxygen.  Sinus tachycardia related to her respiratory issues.  MRSA screen negative, stopped Vancomycin.  Continuing Cefepime. 1/17.  Patient on 10 L high flow nasal cannula.  Assessment and Plan: * Multifocal pneumonia Severe Sepsis - POA with tachycardia, tachypnea, fever and lactic acidosis.  Patient also has acute hypoxic respiratory failure. --Continue Cefepime, and doxycycline --Incentive spirometer --Follow sputum cultures --Blood cultures negative for 2  days.  Severe sepsis (HCC) As above  Acute respiratory failure with hypoxia (HCC) With multifocal pneumonia.  Patient on 10 L high flow nasal cannula.  Continue nebulizers.  Patient refused steroids.  COPD (chronic obstructive pulmonary disease) (HCC) Bronchodilators. Patient refused steroids.  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  Patient is on radiation and chemotherapy, last chemo was 01/08/2022 -[facility] will let Dr. Smith Robert know that the patient is here.  Depression Continue home meds  Hypomagnesemia Will give IV magnesium again today.  Hypokalemia Replaced  Gastric ulcer Continue Protonix, Carafate  Protein-calorie malnutrition, severe (HCC) Underweight with a BMI of 13.13    E. coli UTI Follow-up sensitivities  Iron deficiency anemia Last hemoglobin 9.4.        Subjective: Patient felt like she was not going to make it when she came into the hospital.  Today she feels like she is going to make it.  Coughing up greenish/brownish phlegm.  Shortness of breath.  Admitted with pneumonia.  Physical Exam: Vitals:   02/14/22 1158 02/14/22 1317 02/14/22 1411 02/14/22 1456  BP: 116/64     Pulse: 100   (!) 104  Resp: 12  20 18   Temp:      TempSrc:      SpO2: 93% 94% 92% 92%  Weight:      Height:       Physical Exam HENT:     Head: Normocephalic.     Mouth/Throat:  Pharynx: No oropharyngeal exudate.  Eyes:     General: Lids are normal.     Conjunctiva/sclera: Conjunctivae normal.  Cardiovascular:     Rate and Rhythm: Normal rate and regular rhythm.     Heart sounds: Normal heart sounds, S1 normal and S2 normal.  Pulmonary:     Breath sounds: Examination of the right-middle field reveals decreased breath sounds and wheezing. Examination of the left-middle field reveals decreased breath sounds and wheezing. Examination  of the right-lower field reveals decreased breath sounds and rhonchi. Examination of the left-lower field reveals decreased breath sounds and rhonchi. Decreased breath sounds, wheezing and rhonchi present. No rales.  Abdominal:     Palpations: Abdomen is soft.     Tenderness: There is no abdominal tenderness.  Musculoskeletal:     Right lower leg: No swelling.     Left lower leg: No swelling.  Skin:    General: Skin is warm.     Findings: No rash.  Neurological:     Mental Status: She is alert and oriented to person, place, and time.     Data Reviewed: Creatinine 0.39, hemoglobin 9.4, platelet count 195, white blood cell count 7.0  Family Communication: Left message for patient's son  Disposition: Status is: Inpatient Remains inpatient appropriate because: Patient on 10 L bubble high flow nasal cannula.  Planned Discharge Destination: To be determined    Time spent: 28 minutes  Author: Alford Highland, MD 02/14/2022 3:36 PM  For on call review [email or web address].

## 2022-02-14 NOTE — Progress Notes (Signed)
PT Cancellation Note  Patient Details Name: Carol Shaffer MRN: [id number] DOB: 04/16/1945   Cancelled Treatment:    Reason Eval/Treat Not Completed: Medical issues which prohibited therapy. Patient currently on 10 liters HFNC. Will continue to follow and evaluate for PT needs when able to tolerate.     [name] 02/14/2022, 1:25 PM

## 2022-02-14 NOTE — Progress Notes (Signed)
Initial Nutrition Assessment  DOCUMENTATION CODES:   Underweight, Severe malnutrition in context of chronic illness  INTERVENTION:   -D/c Ensure Enlive po BID, each supplement provides 350 kcal and 20 grams of protein; per pt, she does not tolerate this well -MVI with minerals daily -Continue with regular diet -Encourage adequate oral intake; pt may consume outside food as desired -Snacks TID between meals -RD updated [facility] on pt progress  NUTRITION DIAGNOSIS:   Severe Malnutrition related to chronic illness (COPD) as evidenced by percent weight loss, moderate fat depletion, severe fat depletion, severe muscle depletion, moderate muscle depletion.  GOAL:   Patient will meet greater than or equal to 90% of their needs  MONITOR:   PO intake, Supplement acceptance  REASON FOR ASSESSMENT:   Malnutrition Screening Tool, Consult Assessment of nutrition requirement/status  ASSESSMENT:   Pt with medical history significant of NSCLC (s/p of right lower lobe lobectomy, radiation and chemotherapy), COPD, former smoker, stroke, GERD, depression, who presents with fever, SOB.  Pt admitted with HCAP and severe sepsis.   Reviewed I/O's: +1.4 L x 24 hours and +870 ml since admission  Spoke with pt at bedside, who was pleasant and in good spirits today. Pt reports she is hungry and eager to eat breakfast. Per pt, she has a great appetite. She does not follow a consistent meal schedule, but grazes throughout the day. Per pt, she was diagnosed with hyperglycemia in the past and shares that in her experience, this is best managed by eating foods without sugar. Pt consumes a lot of vegetables and protein in her diet, especially scrambled and boiled eggs. Per pt, she drink 4 Fairlife drinks daily. Pt shares that she does not tolerate Boost and Ensure products well due to the sugar content. Offered Ensure Max supplement, however, pt politely declined. Pt follows with [facility] and  she reports that these visits are helpful to her.    Per pt, she admits to losing "a ton" of weight. Pt shares that her lowest wt was 60#, but she is steadily gaining back with improved oral intake and supplements. Reviewed wt hx; pt has experienced a 9.8% wt loss over the past 2 months, which is significant for time frame.   Discussed importance of good meal and supplement intake to promote healing. Encouraged visitors to bring outside food and supplements if able. Also ordered between meal nourishments per pt request. Pt refused all offer of supplements available on formulary, as she does not tolerate them well.   Medications reviewed and include vitamin D, magnesium chloride, and carafate.   Labs reviewed.    NUTRITION - FOCUSED PHYSICAL EXAM:  Flowsheet Row Most Recent Value  Orbital Region Severe depletion  Upper Arm Region Severe depletion  Thoracic and Lumbar Region Severe depletion  Buccal Region Moderate depletion  Temple Region Moderate depletion  Clavicle Bone Region Severe depletion  Clavicle and Acromion Bone Region Severe depletion  Scapular Bone Region Severe depletion  Dorsal Hand Severe depletion  Patellar Region Severe depletion  Anterior Thigh Region Severe depletion  Posterior Calf Region Severe depletion  Edema (RD Assessment) None  Hair Reviewed  Eyes Reviewed  Mouth Reviewed  Skin Reviewed  Nails Reviewed       Diet Order:   Diet Order             Diet regular Room service appropriate? Yes; Fluid consistency: Thin  Diet effective now  EDUCATION NEEDS:   Education needs have been addressed  Skin:  Skin Assessment: Reviewed RN Assessment  Last BM:  02/11/22  Height:   Ht Readings from Last 1 Encounters:  02/12/22 5' (1.524 m)    Weight:   Wt Readings from Last 1 Encounters:  02/12/22 30.5 kg    Ideal Body Weight:  45.5 kg  BMI:  Body mass index is 13.13 kg/m.  Estimated Nutritional Needs:   Kcal:   1250-1450  Protein:  60-75 grams  Fluid:  > 1.2 L    Levada Schilling, RD, LDN, CDCES Registered Dietitian II Certified Diabetes Care and Education Specialist Please refer to [facility] for RD and/or RD on-call/weekend/after hours pager

## 2022-02-14 NOTE — Progress Notes (Signed)
   02/14/22 0424  Assess: MEWS Score  Temp (!) 100.9 F (38.3 C)  BP 126/62  MAP (mmHg) 80  Pulse Rate (!) 114  Resp 20  SpO2 92 %  O2 Device HFNC  O2 Flow Rate (L/min) 1 L/min  Assess: MEWS Score  MEWS Temp 1  MEWS Systolic 0  MEWS Pulse 2  MEWS RR 0  MEWS LOC 0  MEWS Score 3  MEWS Score Color Yellow  Assess: if the MEWS score is Yellow or Red  Were vital signs taken at a resting state? Yes  Focused Assessment No change from prior assessment  Does the patient meet 2 or more of the SIRS criteria? No  Treat  MEWS Interventions Consulted Respiratory Therapy  Pain Scale 0-10  Pain Score 0  Take Vital Signs  Increase Vital Sign Frequency   (no previously yellow)  Notify: Charge Nurse/RN  Name of Charge Nurse/RN Notified christen, RN  Date Charge Nurse/RN Notified 02/14/22  Time Charge Nurse/RN Notified [phone]  Document  Progress note created (see row info) Yes  Assess: SIRS CRITERIA  SIRS Temperature  0  SIRS Pulse 1  SIRS Respirations  0  SIRS WBC 0  SIRS Score Sum  1

## 2022-02-14 NOTE — Assessment & Plan Note (Addendum)
Completed treatment with Rocephin

## 2022-02-15 DIAGNOSIS — N390 Urinary tract infection, site not specified: Secondary | ICD-10-CM

## 2022-02-15 DIAGNOSIS — D508 Other iron deficiency anemias: Secondary | ICD-10-CM

## 2022-02-15 DIAGNOSIS — J449 Chronic obstructive pulmonary disease, unspecified: Secondary | ICD-10-CM | POA: Diagnosis not present

## 2022-02-15 DIAGNOSIS — J189 Pneumonia, unspecified organism: Secondary | ICD-10-CM | POA: Diagnosis not present

## 2022-02-15 DIAGNOSIS — B9620 Unspecified Escherichia coli [E. coli] as the cause of diseases classified elsewhere: Secondary | ICD-10-CM

## 2022-02-15 DIAGNOSIS — A419 Sepsis, unspecified organism: Secondary | ICD-10-CM | POA: Diagnosis not present

## 2022-02-15 DIAGNOSIS — J9601 Acute respiratory failure with hypoxia: Secondary | ICD-10-CM | POA: Diagnosis not present

## 2022-02-15 LAB — HEMOGLOBIN: Hemoglobin: 9.5 g/dL — ABNORMAL LOW (ref 12.0–15.0)

## 2022-02-15 LAB — LEGIONELLA PNEUMOPHILA SEROGP 1 UR AG: L. pneumophila Serogp 1 Ur Ag: NEGATIVE

## 2022-02-15 LAB — MAGNESIUM: Magnesium: 1.5 mg/dL — ABNORMAL LOW (ref 1.7–2.4)

## 2022-02-15 LAB — POTASSIUM: Potassium: 3.2 mmol/L — ABNORMAL LOW (ref 3.5–5.1)

## 2022-02-15 MED ORDER — MAGNESIUM CHLORIDE 64 MG PO TBEC
1.0000 | DELAYED_RELEASE_TABLET | Freq: Two times a day (BID) | ORAL | Status: DC
  Administered 2022-02-16 – 2022-02-20 (×10): 64 mg via ORAL
  Filled 2022-02-15 (×12): qty 1

## 2022-02-15 MED ORDER — LACTULOSE 10 GM/15ML PO SOLN
30.0000 g | Freq: Two times a day (BID) | ORAL | Status: DC
  Administered 2022-02-15 – 2022-02-18 (×6): 30 g via ORAL
  Filled 2022-02-15 (×10): qty 60

## 2022-02-15 MED ORDER — MAGNESIUM SULFATE 4 GM/100ML IV SOLN
4.0000 g | Freq: Once | INTRAVENOUS | Status: AC
  Administered 2022-02-15: 4 g via INTRAVENOUS
  Filled 2022-02-15: qty 100

## 2022-02-15 MED ORDER — POTASSIUM CHLORIDE CRYS ER 20 MEQ PO TBCR
40.0000 meq | EXTENDED_RELEASE_TABLET | Freq: Once | ORAL | Status: AC
  Administered 2022-02-15: 40 meq via ORAL
  Filled 2022-02-15: qty 2

## 2022-02-15 MED ORDER — DILTIAZEM HCL 30 MG PO TABS
60.0000 mg | ORAL_TABLET | Freq: Four times a day (QID) | ORAL | Status: DC
  Administered 2022-02-15 – 2022-02-20 (×22): 60 mg via ORAL
  Filled 2022-02-15 (×22): qty 2

## 2022-02-15 NOTE — Progress Notes (Signed)
TOC acknowledges SNF recommendation. Patient currently on 15L HFNC, will need to wean to 6L or below to be appropriate for SNF. Pending medical improvement at this time.  Darolyn Rua, [place], MSW, [place] [phone]

## 2022-02-15 NOTE — Progress Notes (Signed)
PT Cancellation Note  Patient Details Name: RAYLI WIEDERHOLD MRN: [id number] DOB: 05-29-1945   Cancelled Treatment:    Reason Eval/Treat Not Completed: Medical issues which prohibited therapy. Holding PT this am, patient HR in 120s at rest and O2 needs have increased since yesterday. Will continue to monitor and see as appropriate.     Nuvia Hileman 02/15/2022, 10:12 AM

## 2022-02-15 NOTE — Care Management Important Message (Signed)
Important Message  Patient Details  Name: Carol Shaffer MRN: [id number] Date of Birth: [birth year]   Medicare Important Message Given:  Yes     [name] 02/15/2022, 1:13 PM

## 2022-02-15 NOTE — Progress Notes (Signed)
Progress Note   Patient: Carol Shaffer AOU:[id number] DOB: 1945-12-21 DOA: 02/12/2022     3 DOS: the patient was seen and examined on 02/15/2022   Brief hospital course: HPI on admission per Dr. Clyde Lundborg: "Carol Shaffer is a 77 y.o. female with medical history significant of NSCLC (s/p of right lower lobe lobectomy, radiation and chemotherapy), COPD, former smoker, stroke, GERD, depression, who presents with fever, SOB.   Patient states that she has fever, chills, cough, shortness breath for more than 3 days, which has been progressively worsening.  Patient has cough with brownish colored sputum production.  Patient normally is not using oxygen, but was found to have oxygen desaturation to 83% on room air, which initally improved to 92-93% on 4 L oxygen in ED, but deteriorated with worsening shortness of breath, using accessory muscle for breathing, started BiPAP in ED. Patient does not have nausea, vomiting, diarrhea or abdominal pain.  No symptoms of UTI.  She states that she had mild wheezing at home, which has resolved.   Data reviewed independently and ED Course: pt was found to have WBC 11.6, negative PCR for COVID and flu, potassium 3.0, GFR> 60, temperature 101, blood pressure 118/97, heart rate 130s, RR 23.  Patient is admitted to PCU as inpatient."  Imaging included CXR and CTA chest which was negative for PE but showed extensive multifocal pneumonia.   1/16: Pt remains very dyspneic with borderline O2 sats on 10-15 L HFNC oxygen.  Sinus tachycardia related to her respiratory issues.  MRSA screen negative, stopped Vancomycin.  Continuing Cefepime. 1/17.  Patient on 10 L high flow nasal cannula. 1/18.  Patient on 13 L high flow nasal cannula this afternoon  Assessment and Plan: * Multifocal pneumonia Severe Sepsis - POA with tachycardia, tachypnea, fever and lactic acidosis.  Patient also has acute hypoxic respiratory failure. --Continue Cefepime, and doxycycline --Incentive  spirometer --Follow sputum cultures --Blood cultures negative for 2 days.  Severe sepsis (HCC) As above  Acute respiratory failure with hypoxia (HCC) With multifocal pneumonia.  Patient on 13 L high flow nasal cannula.  Continue nebulizers.  Patient refused steroids again today.  COPD (chronic obstructive pulmonary disease) (HCC) Bronchodilators. Patient refused steroids.  Sinus tachycardia Standing dose p.o. Cardizem every 6 hours.   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  Patient is on radiation and chemotherapy, last chemo was 01/08/2022   Depression Continue home meds  Hypomagnesemia Will give IV magnesium again today and continue oral supplementation also.  Hypokalemia Replace today orally  Gastric ulcer Continue Protonix, Carafate  Protein-calorie malnutrition, severe (HCC) Underweight with a BMI of 13.13    E. coli UTI Follow-up sensitivities  Iron deficiency anemia Last hemoglobin 9.5.        Subjective: Patient still with some shortness of breath and cough.  Brought up some phlegm while is in the room which had a little blood tinge in it.  Admitted with pneumonia.  Currently on high flow nasal cannula.  Physical Exam: Vitals:   02/15/22 1134 02/15/22 1320 02/15/22 1439 02/15/22 1741  BP: 118/60  117/62 118/63  Pulse: (!) 108 (!) 106 (!) 103   Resp: 19 20 (!) 21   Temp: 98.5 F (36.9 C)  98.7 F (37.1 C)   TempSrc: Oral  Oral   SpO2: 96% 90% 94%   Weight:      Height:       Physical Exam HENT:  Head: Normocephalic.     Mouth/Throat:     Pharynx: No oropharyngeal exudate.  Eyes:     General: Lids are normal.     Conjunctiva/sclera: Conjunctivae normal.  Cardiovascular:     Rate and Rhythm: Normal rate and regular rhythm.     Heart sounds: Normal heart sounds, S1 normal and S2 normal.  Pulmonary:     Breath sounds: Examination of the right-middle field reveals decreased breath  sounds and wheezing. Examination of the left-middle field reveals decreased breath sounds and wheezing. Examination of the right-lower field reveals decreased breath sounds and rhonchi. Examination of the left-lower field reveals decreased breath sounds and rhonchi. Decreased breath sounds, wheezing and rhonchi present. No rales.  Abdominal:     Palpations: Abdomen is soft.     Tenderness: There is no abdominal tenderness.  Musculoskeletal:     Right lower leg: No swelling.     Left lower leg: No swelling.  Skin:    General: Skin is warm.     Findings: No rash.  Neurological:     Mental Status: She is alert and oriented to person, place, and time.     Data Reviewed: Hemoglobin 9.5, potassium 3.2, magnesium 1.5 Family Communication: Updated patient's son on the phone  Disposition: Status is: Inpatient Remains inpatient appropriate because: Patient still on high flow nasal cannula.  Patient does not want steroids secondary to stomach issues and not eating when she takes them.  May take longer to progress.  Planned Discharge Destination: Rehab    Time spent: 28 minutes  Author: Alford Highland, MD 02/15/2022 5:59 PM  For on call review [email or web address].

## 2022-02-15 NOTE — Consult Note (Signed)
Hematology/Oncology Consult note [facility] [phone] [phone]  Patient Care Team: Albina Billet, MD as PCP - General (Internal Medicine) Marden Noble, MD (Internal Medicine) Bary Castilla, Forest Gleason, MD (General Surgery) Telford Nab, RN as Oncology Nurse Navigator Noreene Filbert, MD as Referring Physician (Radiation Oncology) Sindy Guadeloupe, MD as Consulting Physician (Oncology)   Name of the patient: Carol Shaffer  [id number]  December 17, 1945    Reason for referral-acute hypoxic respiratory failure   Requesting physician: Dr. Leslye Peer  Date of visit: 02/15/2022  History of presenting illness-patient is a 77 year old female with history of stage III non-small cell lung cancer treated with concurrent chemoradiation and was going to start maintenance immunotherapy as an outpatient.  Last dose of chemotherapy given on 01/08/2022.  On the day patient was supposed to receive first dose of immunotherapy patient was found to be febrile tachycardic and short of breath and therefore sent to the ER for further evaluation.  She was found to have multifocal pneumonia requiring IV antibiotics.  She is currently on 13 L of high flow oxygen.   ECOG PS- 2-3  Pain scale- 0   Review of systems- Review of Systems  Constitutional:  Positive for malaise/fatigue. Negative for chills, fever and weight loss.  HENT:  Negative for congestion, ear discharge and nosebleeds.   Eyes:  Negative for blurred vision.  Respiratory:  Positive for shortness of breath. Negative for cough, hemoptysis, sputum production and wheezing.   Cardiovascular:  Negative for chest pain, palpitations, orthopnea and claudication.  Gastrointestinal:  Negative for abdominal pain, blood in stool, constipation, diarrhea, heartburn, melena, nausea and vomiting.  Genitourinary:  Negative for dysuria, flank pain, frequency, hematuria and urgency.  Musculoskeletal:  Negative for back pain, joint  pain and myalgias.  Skin:  Negative for rash.  Neurological:  Negative for dizziness, tingling, focal weakness, seizures, weakness and headaches.  Endo/Heme/Allergies:  Does not bruise/bleed easily.  Psychiatric/Behavioral:  Negative for depression and suicidal ideas. The patient does not have insomnia.     Allergies  Allergen Reactions   Hydromorphone Hcl Itching   Bentyl [Dicyclomine] Nausea And Vomiting   Biaxin [Clarithromycin] Other (See Comments)    hallucinations   Budesonide Other (See Comments)    Ulcer   Erythromycin Nausea And Vomiting   Reglan [Metoclopramide] Other (See Comments)    tremors    Patient Active Problem List   Diagnosis Date Noted   E. coli UTI 02/14/2022   Sinus tachycardia 02/13/2022   Multifocal pneumonia 02/12/2022   Severe sepsis ([place]) 02/12/2022   COPD (chronic obstructive pulmonary disease) ([place]) 02/12/2022   Depression 02/12/2022   Hypokalemia 02/12/2022   Protein-calorie malnutrition, severe ([place]) 02/12/2022   Gastric ulcer 02/12/2022   Acute respiratory failure with hypoxia ([place]) 02/12/2022   Hypomagnesemia 02/12/2022   Goals of care, counseling/discussion 11/03/2021   NSCLC of right lung ([place])    Non-small cell cancer of middle lobe of right lung ([place]) 06/29/2019   History of lung cancer 09/23/2018   Right lower lobe pulmonary nodule 09/23/2018   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   Weight loss, unintentional 09/17/2018   Generalized abdominal pain 09/11/2018   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[place]) 09/11/2018   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   Abdominal pain, LUQ 08/13/2018   Abnormal computed tomography of small intestine 08/13/2018   Positive fecal occult blood test 11/17/2017   Iron deficiency anemia 08/08/2017   Collagenous colitis 05/07/2017   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/07/2017   Nausea and vomiting 07/03/2016  Gastric outlet obstruction 11/11/2013   Lung cancer ([place]) 12/08/2010     Past Medical History:   Diagnosis Date   Arthritis    hands   Avascular necrosis of hip, right ([place])    Blepharospasm    Cancer ([place]) 2011   Right Upper Lobe Lobectomy   Chronic diarrhea    Chronic diarrhea    Collagenous colitis    Complication of anesthesia    usually wakes up during procedures  (endoscopy and colonoscopy)   COPD (chronic obstructive pulmonary disease) ([place])    COVID 2022   Depression    Diverticulosis    Gastric outlet obstruction    Headache    every couple of days   Hemorrhoid    History of Crohn's disease    Hypoglycemic disorder    IDA (iron deficiency anemia)    Intestinal adhesions    Multiple gastric ulcers    Multiple gastric ulcers    Nausea and vomiting 07/03/2016   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[place])    Stroke ([place]) 8 yrs ago   double vision left eye     Past Surgical History:  Procedure Laterality Date   APPENDECTOMY  1989   botox injections for blepharospasm     BREAST SURGERY     CATARACT EXTRACTION     COLON RESECTION  2005   COLONOSCOPY  10-29-2008   [name]   COLONOSCOPY WITH PROPOFOL N/A 03/05/2017   Procedure: COLONOSCOPY WITH PROPOFOL;  Surgeon: Toledo, Benay Pike, MD;  Location: [facility];  Service: Gastroenterology;  Laterality: N/A;   COLOSTOMY REVERSAL  2006   ESOPHAGOGASTRODUODENOSCOPY (EGD) WITH PROPOFOL N/A 07/18/2016   Procedure: ESOPHAGOGASTRODUODENOSCOPY (EGD) WITH PROPOFOL;  Surgeon: Robert Bellow, MD;  Location: [facility];  Service: Endoscopy;  Laterality: N/A;   ESOPHAGOGASTRODUODENOSCOPY (EGD) WITH PROPOFOL N/A 08/03/2016   Procedure: ESOPHAGOGASTRODUODENOSCOPY (EGD) WITH PROPOFOL;  Surgeon: Robert Bellow, MD;  Location: [facility];  Service: Endoscopy;  Laterality: N/A;   ESOPHAGOGASTRODUODENOSCOPY (EGD) WITH PROPOFOL N/A 09/01/2018   Procedure: ESOPHAGOGASTRODUODENOSCOPY (EGD) WITH PROPOFOL;  Surgeon: Toledo, Benay Pike, MD;  Location: [facility];  Service: Gastroenterology;  Laterality: N/A;    ESOPHAGOGASTRODUODENOSCOPY (EGD) WITH PROPOFOL N/A 12/01/2018   Procedure: ESOPHAGOGASTRODUODENOSCOPY (EGD) WITH PROPOFOL;  Surgeon: Toledo, Benay Pike, MD;  Location: [facility];  Service: Gastroenterology;  Laterality: N/A;   ESOPHAGOGASTRODUODENOSCOPY (EGD) WITH PROPOFOL N/A 02/23/2019   Procedure: ESOPHAGOGASTRODUODENOSCOPY (EGD) WITH PROPOFOL;  Surgeon: Lucilla Lame, MD;  Location: [place];  Service: Endoscopy;  Laterality: N/A;   ESOPHAGOSCOPY WITH DILITATION  2012,2015   [facility] [place]   EYE SURGERY     FLEXIBLE BRONCHOSCOPY N/A 10/30/2021   Procedure: FLEXIBLE BRONCHOSCOPY;  Surgeon: Armando Reichert, MD;  Location: [facility];  Service: Pulmonary;  Laterality: N/A;   GASTRECTOMY     gastric ulcer  1989, 1991   IR IMAGING GUIDED PORT INSERTION  11/14/2021   JOINT REPLACEMENT     right total hip arthroplasty 03/03/02   LAPAROSCOPIC LYSIS OF ADHESIONS     LUNG CANCER SURGERY  2011   LYSIS OF ADHESION     PLACEMENT OF BREAST IMPLANTS  1973   thoracotomy with right upper lobectomy and central compartment node dissection     UPPER GASTROINTESTINAL ENDOSCOPY  10-29-2008   [name]   VIDEO BRONCHOSCOPY WITH ENDOBRONCHIAL ULTRASOUND N/A 10/30/2021   Procedure: VIDEO BRONCHOSCOPY WITH ENDOBRONCHIAL ULTRASOUND;  Surgeon: Armando Reichert, MD;  Location: [facility];  Service: Pulmonary;  Laterality: N/A;    Social History  Socioeconomic History   Marital status: Widowed    Spouse name: Not on file   Number of children: 2   Years of education: Not on file   Highest education level: Not on file  Occupational History   Occupation: retired    Comment: Pharmacist, hospital  Tobacco Use   Smoking status: Former    Packs/day: 1.00    Years: 25.00    Total pack years: 25.00    Types: Cigarettes    Quit date: 01/30/2008    Years since quitting: 14.0   Smokeless tobacco: Never  Vaping Use   Vaping Use: Never used  Substance and Sexual Activity   Alcohol use: Yes    Comment: occ. for holiday    Drug use: No   Sexual activity: Not Currently  Other Topics Concern   Not on file  Social History Narrative   Lives alone    Social Determinants of Health   Financial Resource Strain: Not on file  Food Insecurity: No Food Insecurity (02/13/2022)   Hunger Vital Sign    Worried About Running Out of Food in the Last Year: Never true    Ran Out of Food in the Last Year: Never true  Transportation Needs: Unmet Transportation Needs (02/13/2022)   PRAPARE - Hydrologist (Medical): Yes    Lack of Transportation (Non-Medical): Yes  Physical Activity: Not on file  Stress: Not on file  Social Connections: Not on file  Intimate Partner Violence: Not At Risk (02/13/2022)   Humiliation, Afraid, Rape, and Kick questionnaire    Fear of Current or Ex-Partner: No    Emotionally Abused: No    Physically Abused: No    Sexually Abused: No     Family History  Problem Relation Age of Onset   Hypertension Mother    Diabetes Father      Current Facility-Administered Medications:    acetaminophen (TYLENOL) tablet 650 mg, 650 mg, Oral, Q6H PRN, Ivor Costa, MD, 650 mg at 02/12/22 1736   albuterol (PROVENTIL) (2.5 MG/3ML) 0.083% nebulizer solution 2.5 mg, 2.5 mg, Nebulization, Q4H PRN, Ivor Costa, MD   brimonidine (ALPHAGAN) 0.2 % ophthalmic solution 1 drop, 1 drop, Both Eyes, BID, 1 drop at 02/15/22 1028 **AND** timolol (TIMOPTIC) 0.5 % ophthalmic solution 1 drop, 1 drop, Both Eyes, BID, Darrick Penna, RPH, 1 drop at 02/15/22 1028   ceFEPIme (MAXIPIME) 2 g in sodium chloride 0.9 % 100 mL IVPB, 2 g, Intravenous, Q24H, Alison Murray, RPH, Last Rate: 200 mL/hr at 02/15/22 1438, 2 g at 02/15/22 1438   Chlorhexidine Gluconate Cloth 2 % PADS 6 each, 6 each, Topical, Daily, Ezekiel Slocumb, DO, 6 each at 02/15/22 1023   cholecalciferol (VITAMIN D3) 25 MCG (1000 UNIT) tablet 2,000 Units, 2,000 Units, Oral, Daily, Ivor Costa, MD, 2,000 Units at 02/15/22 1015    dextromethorphan-guaiFENesin (Derma DM) 30-600 MG per 12 hr tablet 1 tablet, 1 tablet, Oral, BID, Nicole Kindred A, DO, 1 tablet at 02/15/22 1017   diltiazem (CARDIZEM) tablet 60 mg, 60 mg, Oral, Q6H, Wieting, Richard, MD, 60 mg at 02/15/22 1741   diphenhydrAMINE (BENADRYL) injection 12.5 mg, 12.5 mg, Intravenous, Q8H PRN, Ivor Costa, MD   docusate (COLACE) 50 MG/5ML liquid 50 mg, 50 mg, Oral, Daily, Ivor Costa, MD, 50 mg at 02/15/22 1026   doxycycline (VIBRA-TABS) tablet 100 mg, 100 mg, Oral, Q12H, Wieting, Richard, MD, 100 mg at 02/15/22 1019   enoxaparin (LOVENOX) injection 30 mg, 30 mg, Subcutaneous, Q24H,  Benita Gutter, RPH, 30 mg at 02/14/22 2126   erythromycin ophthalmic ointment 1 Application, 1 Application, Both Eyes, QHS, Ivor Costa, MD   HYDROcodone-acetaminophen (NORCO/VICODIN) 5-325 MG per tablet 1 tablet, 1 tablet, Oral, Q6H PRN, Nicole Kindred A, DO, 1 tablet at 02/15/22 1440   ipratropium-albuterol (DUONEB) 0.5-2.5 (3) MG/3ML nebulizer solution 3 mL, 3 mL, Nebulization, Q6H, Griffith, Kelly A, DO, 3 mL at 02/15/22 1320   lactulose (CHRONULAC) 10 GM/15ML solution 30 g, 30 g, Oral, BID, Wieting, Richard, MD, 30 g at 02/15/22 1019   loratadine (CLARITIN) tablet 10 mg, 10 mg, Oral, Daily, Ivor Costa, MD, 10 mg at 02/15/22 1018   magnesium chloride (SLOW-MAG) 64 MG SR tablet 64 mg, 1 tablet, Oral, BID WC, Wieting, Richard, MD   morphine (PF) 2 MG/ML injection 2 mg, 2 mg, Intravenous, Q4H PRN, Nicole Kindred A, DO, 2 mg at 02/13/22 1733   multivitamin with minerals tablet 1 tablet, 1 tablet, Oral, Daily, Ivor Costa, MD, 1 tablet at 02/15/22 1018   pantoprazole (PROTONIX) EC tablet 20 mg, 20 mg, Oral, Daily, Ivor Costa, MD, 20 mg at 02/15/22 1741   sucralfate (CARAFATE) tablet 1 g, 1 g, Oral, QID, Ivor Costa, MD, 1 g at 02/15/22 1741   traZODone (DESYREL) tablet 300 mg, 300 mg, Oral, QHS, Niu, Soledad Gerlach, MD, 300 mg at 02/14/22 2126   venlafaxine XR (EFFEXOR-XR) 24 hr capsule 150 mg, 150  mg, Oral, Q breakfast, Ivor Costa, MD, 150 mg at 02/15/22 1015  Facility-Administered Medications Ordered in Other Encounters:    heparin lock flush 100 UNIT/ML injection, , , ,    Physical exam:  Vitals:   02/15/22 1134 02/15/22 1320 02/15/22 1439 02/15/22 1741  BP: 118/60  117/62 118/63  Pulse: (!) 108 (!) 106 (!) 103   Resp: 19 20 (!) 21   Temp: 98.5 F (36.9 C)  98.7 F (37.1 C)   TempSrc: Oral  Oral   SpO2: 96% 90% 94%   Weight:      Height:       Physical Exam Cardiovascular:     Rate and Rhythm: Regular rhythm. Tachycardia present.     Heart sounds: Normal heart sounds.  Pulmonary:     Comments: Reading increased.  She is on 13 L of high flow oxygen.  Breath sounds decreased bilaterally over bases Abdominal:     General: Bowel sounds are normal.     Palpations: Abdomen is soft.  Skin:    General: Skin is warm and dry.  Neurological:     Mental Status: She is alert and oriented to person, place, and time.           Latest Ref Rng & Units 02/15/2022    6:00 AM  CMP  Potassium 3.5 - 5.1 mmol/L 3.2       Latest Ref Rng & Units 02/15/2022    6:00 AM  CBC  Hemoglobin 12.0 - 15.0 g/dL 9.5     @IMAGES @  CT Angio Chest PE W and/or Wo Contrast  Result Date: 02/12/2022 CLINICAL DATA:  77 year old with pulmonary embolism suspected, high probability. Fever and low oxygen level. History of lung cancer. EXAM: CT ANGIOGRAPHY CHEST WITH CONTRAST TECHNIQUE: Multidetector CT imaging of the chest was performed using the standard protocol during bolus administration of intravenous contrast. Multiplanar CT image reconstructions and MIPs were obtained to evaluate the vascular anatomy. RADIATION DOSE REDUCTION: This exam was performed according to the departmental dose-optimization program which includes automated exposure control, adjustment of the  mA and/or kV according to patient size and/or use of iterative reconstruction technique. CONTRAST:  73m OMNIPAQUE IOHEXOL 350 MG/ML  SOLN COMPARISON:  CT chest, abdomen and pelvis 01/24/2022 FINDINGS: Cardiovascular: Negative for pulmonary embolism. Atherosclerotic calcifications involving the thoracic aorta without aneurysm or dissection. Great vessels are patent. Bilateral proximal vertebral arteries are patent. Right jugular Port-A-Cath tip near the superior cavoatrial junction. Heart size is normal. Mediastinum/Nodes: Thyroid tissue is unremarkable. Concern for increased right subcarinal tissue on image 67/4 measuring up to 1.0 cm in the short axis and this tissue roughly measures 0.5 cm on the previous chest CT. No other significant mediastinal or hilar lymph node enlargement. No axillary lymph node enlargement. Lungs/Pleura: Severe centrilobular emphysema. Postoperative changes compatible with a right upper lobectomy. The focal parenchymal lesion or area of consolidation in the right lower lobe on image 55/5 measures approximately 2.5 x 2.5 cm and minimally changed compared to the recent comparison chest CT. However, there is extensive new septal thickening and parenchymal densities throughout the right lower lobe concerning for infectious or inflammatory process. Small amount of atelectasis along the posterior left lower lobe. Mild septal thickening in left lung. No large pleural effusions. Upper Abdomen: Bilateral renal cysts that do not require dedicated follow-up. No acute abnormality in the visualized upper abdomen. Musculoskeletal: Old right posterior seventh rib fracture with some callus formation. Old T7 vertebral body compression fracture. No acute bone abnormality. Bilateral breast implants. Review of the MIP images confirms the above findings. IMPRESSION: 1. Negative for pulmonary embolism. 2. Extensive new septal thickening and parenchymal densities throughout the right lower lobe. Mild septal thickening scattered throughout the left lung. Findings are concerning for infection / inflammation in the right lower lobe but there may  also be asymmetric pulmonary edema or multifocal infection in the left lung. 3. Focal consolidated area in the right lower lobe has minimally changed. 4. Concern for enlarged right subcarinal nodal tissue. This could be reactive but recommend close surveillance. 5. Aortic Atherosclerosis (ICD10-I70.0) and Emphysema (ICD10-J43.9). Electronically Signed   By: AMarkus DaftM.D.   On: 02/12/2022 13:25   DG Chest Port 1 View  Result Date: 02/12/2022 CLINICAL DATA:  Shortness of breath. EXAM: PORTABLE CHEST 1 VIEW COMPARISON:  Chest x-ray 10/30/2021 FINDINGS: The cardiac silhouette, mediastinal and hilar contours are within normal limits and stable. Underlying emphysematous changes and pulmonary scarring. Right hilar and infrahilar density appears stable and is consistent with known treated right lung cancer. No obvious superimposed infiltrate or effusion. IMPRESSION: 1. Chronic right hilar and infrahilar density consistent with known treated right lung cancer. 2. Underlying emphysematous changes and pulmonary scarring. Electronically Signed   By: PMarijo SanesM.D.   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 RADIOPHARMACEUTICALS:  20.01 mCi Technetium-942mDP IV COMPARISON:  None Correlation: CT chest abdomen pelvis 01/24/2022 FINDINGS: Uptake RIGHT paraspinal at high RIGHT thoracic region at T1 or T2 level; this corresponds to a lytic focus with a fracture in the RIGHT transverse process of T2 on accompanying CT. Uptake at RIGHT lateral cervical spine, shoulders, sternoclavicular joints, wrists, and LEFT hip typically degenerative. RIGHT hip prosthesis. Increased uptake in midthoracic spine at T7 corresponding to compression  fracture on CT. No additional worrisome sites of tracer accumulation  are identified. Oblique uptake of tracer in LEFT upper quadrant consistent with gastric localization of free pertechnetate. Otherwise expected urinary tract and soft tissue distribution of tracer. IMPRESSION: Focal increased tracer uptake RIGHT transverse process T2 corresponding to a lytic focus with a fracture on CT, consistent with pathologic fracture through a metastasis; the lytic lesion with increased FDG accumulation with seen on most recent PET-CT Uptake at compression fracture T7 vertebral body new since 09/13/2021, could represent an insufficiency fracture or pathologic fracture; no FDG localization at this site on most recent PET-CT. Free pertechnetate localization within stomach. Electronically Signed   By: Lavonia Dana M.D.   On: 01/25/2022 15:23   CT CHEST ABDOMEN PELVIS W CONTRAST  Result Date: 01/25/2022 CLINICAL DATA:  Lung cancer. Remote right upper lobe lung cancer. 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 * Tracking Code: BO * EXAM: CT CHEST, ABDOMEN, AND PELVIS WITH CONTRAST TECHNIQUE: Multidetector CT imaging of the chest, abdomen and pelvis was performed following the standard protocol during bolus administration of intravenous contrast. RADIATION DOSE REDUCTION: This exam was performed according to the departmental dose-optimization program which includes automated exposure control, adjustment of the mA and/or kV according to patient size and/or use of iterative reconstruction technique. CONTRAST:  8m OMNIPAQUE IOHEXOL 300 MG/ML  SOLN COMPARISON:  11/08/2021 PET and CT of the chest of 09/13/2021. FINDINGS: CT CHEST FINDINGS Cardiovascular: Right Port-A-Cath tip superior caval/atrial junction. Aortic atherosclerosis. Normal heart size, without pericardial effusion. Right coronary artery calcification. No central pulmonary embolism, on this non-dedicated study. Mediastinum/Nodes: No  supraclavicular adenopathy. The hypermetabolic high right paratracheal node is decreased in size. 4 mm on 22/6 today versus 9 mm on the prior diagnostic CT. Subcarinal node measures 4 mm on 26/6 versus 8 mm on the prior diagnostic CT. This was also hypermetabolic on the interval PET. No residual soft tissue thickening about the bronchus intermedius/right hilum to correspond to hypermetabolism on prior PET. No left hilar adenopathy. Lungs/Pleura: No pleural fluid. Advanced centrilobular emphysema. Right upper lobectomy. The treated right lower lobe lung lesion measures 2.8 x 2.6 cm on 59/8 versus 3.5 x 2.4 cm on 09/13/2021 when measured in a similar fashion. The left lower lobe subsolid nodule is less apparent today, with only minimal interstitial thickening remaining on 92/8. Musculoskeletal: Bilateral calcified breast implants with suspicion of extracapsular leakage on the right. Osteopenia. No CT correlate for the right T2 transverse process hypermetabolism on prior PET. Right 7th posterolateral rib minimally displaced fracture on 23/6 is unchanged and may relate to radiation induced insufficiency. Moderate T7 compression deformity with minimal ventral canal encroachment is new on sagittal image 65. CT ABDOMEN PELVIS FINDINGS Hepatobiliary: No suspicious liver lesion. Tiny segment 2-3 hepatic cyst. Cholecystectomy, without biliary ductal dilatation. Pancreas: Normal, without mass or ductal dilatation. Spleen: Normal in size, without focal abnormality. Adrenals/Urinary Tract: Normal adrenal glands. Bilateral, left larger than right fluid density renal lesions of up to 3.7 cm are most consistent with cysts . In the absence of clinically indicated signs/symptoms require(s) no independent follow-up. No hydronephrosis. Degraded evaluation of the pelvis, secondary to beam hardening artifact from right hip arthroplasty. Grossly normal urinary bladder. Stomach/Bowel: Surgical changes about the gastroesophageal junction  and gastric body. Rectosigmoid sutures. Colonic stool burden suggests constipation. Normal small bowel. Vascular/Lymphatic: Aortic atherosclerosis. No abdominopelvic adenopathy. Reproductive: Poorly evaluated secondary to beam hardening artifact. No gross adnexal mass. Other: No significant free fluid. No evidence of omental or peritoneal disease. No free intraperitoneal air. Musculoskeletal:  Right hip arthroplasty. IMPRESSION: 1. Response to therapy of mediastinal nodal metastasis and right hilar lcal recurrence centered about the bronchus intermedius. 2. Decreased size of treated right lower lobe primary. 3. No new or progressive disease. 4. No acute process or evidence of metastatic disease in the abdomen or pelvis. 5.  Possible constipation. 6. Interval T7 compression deformity, favored to be due to osteopenia. No correlate hypermetabolism on prior PET. 7. Aortic atherosclerosis (ICD10-I70.0), coronary artery atherosclerosis and emphysema (ICD10-J43.9). 8. Degraded evaluation of the pelvis, secondary to beam hardening artifact from right hip arthroplasty. Electronically Signed   By: Abigail Miyamoto M.D.   On: 01/25/2022 08:48    Assessment and plan- Patient is a 77 y.o. female with history of stage IV non-small cell lung cancer s/p concurrent chemoradiation with last dose given on 01/08/2022 admitted for acute hypoxic respiratory failure secondary to healthcare associated pneumonia  Healthcare associated pneumonia: Patient is currently on cefepime and doxycycline.  Symptomatically patient reports feeling better but she is still on 13 L of high flow oxygen.  She cannot be discharged until her oxygen requirements come down to 6 L.  At present patient wants to continue her present line of treatment and hopes to get better from her pneumonia.  Although patient has stage IV lung cancer this was based on isolatedUptake in the T2 vertebral area but no other evidence of distant metastatic disease.  My plan was to  treat her with the potential curative intent.  She did have good response to concurrent chemoradiation and was supposed to start maintenance immunotherapy which she never did.  If overall her condition does not improve over the next week or so palliative care and will need to be involved but presently patient desires aggressive treatment    Visit Diagnosis 1. Shortness of breath   2. Pneumonia of both lungs due to infectious organism, unspecified part of lung   3. Sepsis, due to unspecified organism, unspecified whether acute organ dysfunction present ([place])   4. Acute respiratory failure with hypoxia ([facility])   5. Long QT interval   6. Hypokalemia     Dr. Randa Evens, MD, MPH [facility] [phone] 02/15/2022

## 2022-02-16 DIAGNOSIS — J9601 Acute respiratory failure with hypoxia: Secondary | ICD-10-CM | POA: Diagnosis not present

## 2022-02-16 DIAGNOSIS — J189 Pneumonia, unspecified organism: Secondary | ICD-10-CM | POA: Diagnosis not present

## 2022-02-16 DIAGNOSIS — J449 Chronic obstructive pulmonary disease, unspecified: Secondary | ICD-10-CM | POA: Diagnosis not present

## 2022-02-16 DIAGNOSIS — C342 Malignant neoplasm of middle lobe, bronchus or lung: Secondary | ICD-10-CM

## 2022-02-16 DIAGNOSIS — A419 Sepsis, unspecified organism: Secondary | ICD-10-CM | POA: Diagnosis not present

## 2022-02-16 LAB — BASIC METABOLIC PANEL
Anion gap: 7 (ref 5–15)
BUN: 14 mg/dL (ref 8–23)
CO2: 29 mmol/L (ref 22–32)
Calcium: 7.8 mg/dL — ABNORMAL LOW (ref 8.9–10.3)
Chloride: 101 mmol/L (ref 98–111)
Creatinine, Ser: 0.48 mg/dL (ref 0.44–1.00)
GFR, Estimated: >60 mL/min (ref >60)
Glucose, Bld: 113 mg/dL — ABNORMAL HIGH (ref 70–99)
Potassium: 3.6 mmol/L (ref 3.5–5.1)
Sodium: 137 mmol/L (ref 135–145)

## 2022-02-16 LAB — EXPECTORATED SPUTUM ASSESSMENT W GRAM STAIN, RFLX TO RESP C

## 2022-02-16 LAB — MAGNESIUM: Magnesium: 1.5 mg/dL — ABNORMAL LOW (ref 1.7–2.4)

## 2022-02-16 MED ORDER — HYDROCOD POLI-CHLORPHE POLI ER 10-8 MG/5ML PO SUER
5.0000 mL | Freq: Two times a day (BID) | ORAL | Status: DC | PRN
  Administered 2022-02-16 – 2022-02-18 (×3): 5 mL via ORAL
  Filled 2022-02-16 (×3): qty 5

## 2022-02-16 MED ORDER — SODIUM CHLORIDE 0.9 % IV SOLN
1.0000 g | INTRAVENOUS | Status: AC
  Administered 2022-02-17 – 2022-02-18 (×2): 1 g via INTRAVENOUS
  Filled 2022-02-16 (×2): qty 10

## 2022-02-16 MED ORDER — MAGNESIUM SULFATE 4 GM/100ML IV SOLN
4.0000 g | Freq: Once | INTRAVENOUS | Status: AC
  Administered 2022-02-16: 4 g via INTRAVENOUS
  Filled 2022-02-16: qty 100

## 2022-02-16 NOTE — Progress Notes (Signed)
Progress Note   Patient: Carol Shaffer SOA:[id number] DOB: 03/17/1945 DOA: 02/12/2022     4 DOS: the patient was seen and examined on 02/16/2022   Brief hospital course: HPI on admission per Dr. Clyde Lundborg: "Carol Shaffer is a 77 y.o. female with medical history significant of NSCLC (s/p of right lower lobe lobectomy, radiation and chemotherapy), COPD, former smoker, stroke, GERD, depression, who presents with fever, SOB.   Patient states that she has fever, chills, cough, shortness breath for more than 3 days, which has been progressively worsening.  Patient has cough with brownish colored sputum production.  Patient normally is not using oxygen, but was found to have oxygen desaturation to 83% on room air, which initally improved to 92-93% on 4 L oxygen in ED, but deteriorated with worsening shortness of breath, using accessory muscle for breathing, started BiPAP in ED. Patient does not have nausea, vomiting, diarrhea or abdominal pain.  No symptoms of UTI.  She states that she had mild wheezing at home, which has resolved.   Data reviewed independently and ED Course: pt was found to have WBC 11.6, negative PCR for COVID and flu, potassium 3.0, GFR> 60, temperature 101, blood pressure 118/97, heart rate 130s, RR 23.  Patient is admitted to PCU as inpatient."  Imaging included CXR and CTA chest which was negative for PE but showed extensive multifocal pneumonia.   1/16: Pt remains very dyspneic with borderline O2 sats on 10-15 L HFNC oxygen.  Sinus tachycardia related to her respiratory issues.  MRSA screen negative, stopped Vancomycin.  Continuing Cefepime. 1/17.  Patient on 10 L high flow nasal cannula. 1/18.  Patient on 13 L high flow nasal cannula this afternoon 1/19.  Patient on 11 L high flow nasal cannula this morning.  Added Tussionex for cough.  Assessment and Plan: * Multifocal pneumonia Severe Sepsis - POA with tachycardia, tachypnea, fever and lactic acidosis.  Patient also has acute  hypoxic respiratory failure. --Continue Cefepime, and doxycycline --Incentive spirometer --Follow sputum cultures   Severe sepsis (HCC) As above  Acute respiratory failure with hypoxia (HCC) With multifocal pneumonia.  Patient on 11 L high flow nasal cannula this am.  Continue nebulizers.  Declined steroids  COPD (chronic obstructive pulmonary disease) (HCC) Bronchodilators.   Sinus tachycardia Standing dose p.o. Cardizem every 6 hours.   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  Patient is on radiation and chemotherapy, last chemo was 01/08/2022   Depression Continue home meds  Hypomagnesemia Will give IV magnesium again today and continue oral supplementation also.  Hypokalemia Replace today orally  Gastric ulcer Continue Protonix, Carafate  Protein-calorie malnutrition, severe (HCC) Underweight with a BMI of 13.13    E. coli UTI Follow-up sensitivities  Iron deficiency anemia Last hemoglobin 9.5.        Subjective: Patient states she is feeling better but was still on high flow nasal cannula 11 L this morning.  Coughing quite a bit.  Wanted some cough medication.  Physical Exam: Vitals:   02/16/22 1202 02/16/22 1321 02/16/22 1356 02/16/22 1400  BP: 120/72     Pulse: (!) 103     Resp: 18 20  18   Temp: 98.9 F (37.2 C)     TempSrc: Oral     SpO2: (!) 87%  (!) 89%   Weight:      Height:       Physical Exam HENT:     Head: Normocephalic.  Mouth/Throat:     Pharynx: No oropharyngeal exudate.  Eyes:     General: Lids are normal.     Conjunctiva/sclera: Conjunctivae normal.  Cardiovascular:     Rate and Rhythm: Normal rate and regular rhythm.     Heart sounds: Normal heart sounds, S1 normal and S2 normal.  Pulmonary:     Breath sounds: Examination of the right-middle field reveals decreased breath sounds and wheezing. Examination of the left-middle field reveals decreased breath sounds and  wheezing. Examination of the right-lower field reveals decreased breath sounds and rhonchi. Examination of the left-lower field reveals decreased breath sounds and rhonchi. Decreased breath sounds, wheezing and rhonchi present. No rales.  Abdominal:     Palpations: Abdomen is soft.     Tenderness: There is no abdominal tenderness.  Musculoskeletal:     Right lower leg: No swelling.     Left lower leg: No swelling.  Skin:    General: Skin is warm.     Findings: No rash.  Neurological:     Mental Status: She is alert and oriented to person, place, and time.     Data Reviewed: Magnesium 1.5, potassium 3.7, last hemoglobin 9.5  Family Communication: Spoke with son on the phone  Disposition: Status is: Inpatient Remains inpatient appropriate because: Still on high flow nasal cannula  Planned Discharge Destination: Rehab versus home health depending on progress    Time spent: 28 minutes  Author: Alford Highland, MD 02/16/2022 2:38 PM  For on call review [email or web address].

## 2022-02-16 NOTE — Progress Notes (Signed)
Physical Therapy Treatment Patient Details Name: Carol Shaffer MRN: [id number] DOB: Apr 30, 1945 Today's Date: 02/16/2022   History of Present Illness Carol Shaffer is a 77 y.o. female with medical history significant of NSCLC (s/p of right lower lobe lobectomy, radiation and chemotherapy), COPD, former smoker, stroke, GERD, depression, who presents with fever, SOB.    PT Comments    Patient received in bed, she is motivated to participate in PT this morning. Reports she is feeling well. Reports she has had some sob this morning. Patient requires min guard for bed mobility, transfers with min A and ambulated to recliner with min A and RW. Patient requires increased time and rest between activities due to O2 desaturation and fatigue. Patient performed B LE strengthening exercises and endurance exercises with good tolerance. Patient will continue to benefit from skilled PT to improve endurance and strength for safe return home. She could likely go home if she had 24/7 supervision. Otherwise will likely need short term rehab. Will continue to follow.            Recommendations for follow up therapy are one component of a multi-disciplinary discharge planning process, led by the attending physician.  Recommendations may be updated based on patient status, additional functional criteria and insurance authorization.  Follow Up Recommendations  Skilled nursing-short term rehab (<3 hours/day) Can patient physically be transported by private vehicle: Yes   Assistance Recommended at Discharge Intermittent Supervision/Assistance  Patient can return home with the following A little help with walking and/or transfers;A little help with bathing/dressing/bathroom;Help with stairs or ramp for entrance;Assist for transportation;Assistance with cooking/housework   Equipment Recommendations  Rolling walker (2 wheels)    Recommendations for Other Services       Precautions / Restrictions  Precautions Precautions: Fall Restrictions Weight Bearing Restrictions: No     Mobility  Bed Mobility Overal bed mobility: Modified Independent Bed Mobility: Supine to Sit     Supine to sit: Min guard          Transfers Overall transfer level: Needs assistance Equipment used: Rolling walker (2 wheels) Transfers: Sit to/from Stand Sit to Stand: Min assist                Ambulation/Gait Ambulation/Gait assistance: Min guard Gait Distance (Feet): 5 Feet Assistive device: Rolling walker (2 wheels) Gait Pattern/deviations: Step-through pattern, Decreased step length - right, Decreased step length - left, Decreased stride length, Narrow base of support Gait velocity: decr     General Gait Details: ambulates well from bed to recliner 5 feet. Limited by O2 needs at this time. Saturations drop with mobility and exercise.   Stairs             Wheelchair Mobility    Modified Rankin (Stroke Patients Only)       Balance Overall balance assessment: Needs assistance Sitting-balance support: Feet supported Sitting balance-Leahy Scale: Good     Standing balance support: Bilateral upper extremity supported, During functional activity, Reliant on assistive device for balance Standing balance-Leahy Scale: Fair                              Cognition Arousal/Alertness: Awake/alert Behavior During Therapy: WFL for tasks assessed/performed Overall Cognitive Status: Within Functional Limits for tasks assessed                                 General  Comments: patient very motivated to improve and work with therapy        Exercises Other Exercises Other Exercises: STS 2x5 reps, standing marching 2x10 reps, supine SLR, heel slides, hip abd/add x 10 reps. Seated LAQ and marching x 10 reps. Requires brief rest between exercises due to fatigue and O2 desaturation.    General Comments        Pertinent Vitals/Pain Pain Assessment Pain  Assessment: No/denies pain    Home Living                          Prior Function            PT Goals (current goals can now be found in the care plan section) Acute Rehab PT Goals Patient Stated Goal: to return home PT Goal Formulation: With patient Time For Goal Achievement: 02/28/22 Potential to Achieve Goals: Fair Progress towards PT goals: Progressing toward goals    Frequency    Min 2X/week      PT Plan Current plan remains appropriate    Co-evaluation              AM-PAC PT "6 Clicks" Mobility   Outcome Measure  Help needed turning from your back to your side while in a flat bed without using bedrails?: A Little Help needed moving from lying on your back to sitting on the side of a flat bed without using bedrails?: A Little Help needed moving to and from a bed to a chair (including a wheelchair)?: A Little Help needed standing up from a chair using your arms (e.g., wheelchair or bedside chair)?: A Little Help needed to walk in hospital room?: A Little Help needed climbing 3-5 steps with a railing? : A Lot 6 Click Score: 17    End of Session Equipment Utilized During Treatment: Oxygen Activity Tolerance: Patient tolerated treatment well Patient left: in chair;with call bell/phone within reach Nurse Communication: Mobility status PT Visit Diagnosis: Unsteadiness on feet (R26.81);Other abnormalities of gait and mobility (R26.89);Muscle weakness (generalized) (M62.81);Difficulty in walking, not elsewhere classified (R26.2)     Time: 1030-1100 PT Time Calculation (min) (ACUTE ONLY): 30 min  Charges:  $Therapeutic Exercise: 8-22 mins $Therapeutic Activity: 8-22 mins                     Geoff Dacanay, PT, GCS 02/16/22,11:15 AM

## 2022-02-17 ENCOUNTER — Inpatient Hospital Stay: Payer: Medicare Other

## 2022-02-17 DIAGNOSIS — R000 Tachycardia, unspecified: Secondary | ICD-10-CM | POA: Diagnosis not present

## 2022-02-17 DIAGNOSIS — Z7189 Other specified counseling: Secondary | ICD-10-CM

## 2022-02-17 DIAGNOSIS — J9601 Acute respiratory failure with hypoxia: Secondary | ICD-10-CM | POA: Diagnosis not present

## 2022-02-17 DIAGNOSIS — A419 Sepsis, unspecified organism: Secondary | ICD-10-CM | POA: Diagnosis not present

## 2022-02-17 DIAGNOSIS — J189 Pneumonia, unspecified organism: Secondary | ICD-10-CM | POA: Diagnosis not present

## 2022-02-17 LAB — CULTURE, BLOOD (ROUTINE X 2)
Culture: NO GROWTH
Culture: NO GROWTH
Special Requests: ADEQUATE
Special Requests: ADEQUATE

## 2022-02-17 LAB — PHOSPHORUS: Phosphorus: 1.1 mg/dL — ABNORMAL LOW (ref 2.5–4.6)

## 2022-02-17 LAB — POTASSIUM: Potassium: 4.5 mmol/L (ref 3.5–5.1)

## 2022-02-17 LAB — MAGNESIUM: Magnesium: 2 mg/dL (ref 1.7–2.4)

## 2022-02-17 MED ORDER — METHYLPREDNISOLONE SODIUM SUCC 40 MG IJ SOLR
40.0000 mg | Freq: Once | INTRAMUSCULAR | Status: AC
  Administered 2022-02-17: 40 mg via INTRAVENOUS
  Filled 2022-02-17: qty 1

## 2022-02-17 MED ORDER — SODIUM PHOSPHATES 45 MMOLE/15ML IV SOLN
30.0000 mmol | Freq: Once | INTRAVENOUS | Status: AC
  Administered 2022-02-17: 30 mmol via INTRAVENOUS
  Filled 2022-02-17: qty 10

## 2022-02-17 MED ORDER — FUROSEMIDE 10 MG/ML IJ SOLN
40.0000 mg | Freq: Once | INTRAMUSCULAR | Status: AC
  Administered 2022-02-17: 40 mg via INTRAVENOUS
  Filled 2022-02-17: qty 4

## 2022-02-17 NOTE — Progress Notes (Signed)
Progress Note   Patient: Carol Shaffer ZDG:[id number] DOB: 12-24-1945 DOA: 02/12/2022     5 DOS: the patient was seen and examined on 02/17/2022   Brief hospital course: HPI on admission per Dr. Blaine Hamper: "Carol Shaffer is a 77 y.o. female with medical history significant of NSCLC (s/p of right lower lobe lobectomy, radiation and chemotherapy), COPD, former smoker, stroke, GERD, depression, who presents with fever, SOB.   Patient states that she has fever, chills, cough, shortness breath for more than 3 days, which has been progressively worsening.  Patient has cough with brownish colored sputum production.  Patient normally is not using oxygen, but was found to have oxygen desaturation to 83% on room air, which initally improved to 92-93% on 4 L oxygen in ED, but deteriorated with worsening shortness of breath, using accessory muscle for breathing, started BiPAP in ED. Patient does not have nausea, vomiting, diarrhea or abdominal pain.  No symptoms of UTI.  She states that she had mild wheezing at home, which has resolved.   Data reviewed independently and ED Course: pt was found to have WBC 11.6, negative PCR for COVID and flu, potassium 3.0, GFR> 60, temperature 101, blood pressure 118/97, heart rate 130s, RR 23.  Patient is admitted to PCU as inpatient."  Imaging included CXR and CTA chest which was negative for PE but showed extensive multifocal pneumonia.   1/16: Pt remains very dyspneic with borderline O2 sats on 10-15 L HFNC oxygen.  Sinus tachycardia related to her respiratory issues.  MRSA screen negative, stopped Vancomycin.  Continuing Cefepime. 1/17.  Patient on 10 L high flow nasal cannula. 1/18.  Patient on 13 L high flow nasal cannula this afternoon 1/19.  Patient on 11 L high flow nasal cannula this morning.  Added Tussionex for cough. 1/20.  Patient on 100% nonrebreather this morning but this afternoon on heated high flow nasal cannula 100% FiO2.  Patient agreeable to a dose of Lasix  and a dose of Solu-Medrol.  Assessment and Plan: * Multifocal pneumonia Severe Sepsis - POA with tachycardia, tachypnea, fever and lactic acidosis.  Patient also has acute hypoxic respiratory failure. --Continue Rocephin and doxycycline 7-day course --Incentive spirometer --Follow sputum cultures   Severe sepsis ([place]) As above  Acute respiratory failure with hypoxia (HCC) With multifocal pneumonia.  Patient with worsening oxygenation now on 100% heated high flow nasal cannula 100% oxygen and 45 L flow.  Patient agreeable to 1 dose of steroid and 1 dose of Lasix.  Reviewed chest x-ray and shows increased interval diffuse coarsened interstitial markings concerning for either edema or multifocal infection.  COPD (chronic obstructive pulmonary disease) (HCC) Bronchodilators.   Sinus tachycardia Standing dose p.o. Cardizem every 6 hours.   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  Patient is on radiation and chemotherapy, last chemo was 01/08/2022   Depression Continue home meds  Hypomagnesemia Replaced in the normal range.  Continue oral supplementation  Hypokalemia Replaced in normal range.  Continue supplementation.  Gastric ulcer Continue Protonix, Carafate  Protein-calorie malnutrition, severe (HCC) Underweight with a BMI of 13.13    Hypophosphatemia Replace IV phosphorus today.  E. coli UTI Follow-up sensitivities  DNR (do not resuscitate) discussion Confirmed DNR status with increasing oxygen requirements.  Iron deficiency anemia Last hemoglobin 9.5.        Subjective: Patient seen and examined this morning and again this afternoon.  This morning she was on 100% nonrebreather.  This  afternoon was on heated high flow nasal cannula 100% oxygen 45 L flow.  Patient feels okay with regards to her breathing and talking in complete sentences.  Still coughing but a little less than yesterday.  Admitted with  pneumonia.  Physical Exam: Vitals:   02/17/22 0751 02/17/22 0831 02/17/22 1131 02/17/22 1330  BP: (!) 102/57  119/64   Pulse: (!) 104  (!) 104   Resp: 18  18   Temp: 98.1 F (36.7 C)  98 F (36.7 C)   TempSrc:      SpO2: 92% 94% 90% 91%  Weight:      Height:       Physical Exam HENT:     Head: Normocephalic.     Mouth/Throat:     Pharynx: No oropharyngeal exudate.  Eyes:     General: Lids are normal.     Conjunctiva/sclera: Conjunctivae normal.  Cardiovascular:     Rate and Rhythm: Normal rate and regular rhythm.     Heart sounds: Normal heart sounds, S1 normal and S2 normal.  Pulmonary:     Breath sounds: Examination of the right-lower field reveals decreased breath sounds and rhonchi. Examination of the left-lower field reveals decreased breath sounds and rhonchi. Decreased breath sounds and rhonchi present. No wheezing or rales.  Abdominal:     Palpations: Abdomen is soft.     Tenderness: There is no abdominal tenderness.  Musculoskeletal:     Right lower leg: No swelling.     Left lower leg: No swelling.  Skin:    General: Skin is warm.     Findings: No rash.  Neurological:     Mental Status: She is alert and oriented to person, place, and time.     Data Reviewed: Chest x-ray with interval increase in diffuse coarsened interstitial markings in both lungs concerning for either pulmonary edema or worsening multifocal infection Potassium 4.5, phosphorus 1.1, magnesium 2.0  Family Communication: Spoke with son at the bedside this afternoon  Disposition: Status is: Inpatient Remains inpatient appropriate because: Now on heated high flow nasal cannula 100% FiO2  Planned Discharge Destination: To be determined    Time spent: 30 minutes  Author: Alford Highland, MD 02/17/2022 3:40 PM  For on call review [email or web address].

## 2022-02-17 NOTE — Progress Notes (Signed)
CROSS COVER NOTE  NAME: WENDELL FIEBIG MRN: [id number] DOB : 1946/01/03     HPI/Events of Note   77 year old with hx emphysema, stroke, gerd depression and NSCLC with past RLL lobectomy, chemo (last dose 12 /11)and radiation. Sent to ER from oncology services 1/15 for fever and tachycardia on day she was to receive first immunotherapy meds. She was admitted for multifocal pneumonia.  She  has had significant increase in oxygen requirements overnight but no distress  Assessment and  Interventions   Assessment: Review of prior chest imaging CTA 02/12/2022 1. Negative for pulmonary embolism. 2. Extensive new septal thickening and parenchymal densities throughout the right lower lobe. Mild septal thickening scattered throughout the left lung. Findings are concerning for infection / inflammation in the right lower lobe but there may also be asymmetric pulmonary edema or multifocal infection in the left lung. 3. Focal consolidated area in the right lower lobe has minimally changed. 4. Concern for enlarged right subcarinal nodal tissue. This could be reactive but recommend close surveillance. 5. Aortic Atherosclerosis (ICD10-I70.0) and Emphysema (ICD10-J43.9).  Plan: Changed routine chest xray to stat Deep breath, cough incentive and flutter valve therapies as likely atelectasis huge part in overnight decompensation Phos this am 1.1, sodium s      Donnie Mesa NP [facility]

## 2022-02-17 NOTE — Progress Notes (Signed)
Pts O2 sats have deteriorated throughout the shift.  I switched her to a heated HFNC on 100% @ 45 lpm with a non-rebr mask above that.  Her sat improved to 93%.  She has crackles predominately on the L.  Pt appears comfortable.  Discussed with Herbert Seta, pt's RN.

## 2022-02-17 NOTE — Assessment & Plan Note (Addendum)
Replaced.  Recommend checking phosphorus tomorrow.

## 2022-02-17 NOTE — Assessment & Plan Note (Deleted)
Confirmed DNR status with increasing oxygen requirements.

## 2022-02-18 DIAGNOSIS — J9601 Acute respiratory failure with hypoxia: Secondary | ICD-10-CM | POA: Diagnosis not present

## 2022-02-18 DIAGNOSIS — J189 Pneumonia, unspecified organism: Secondary | ICD-10-CM | POA: Diagnosis not present

## 2022-02-18 DIAGNOSIS — A419 Sepsis, unspecified organism: Secondary | ICD-10-CM | POA: Diagnosis not present

## 2022-02-18 DIAGNOSIS — J449 Chronic obstructive pulmonary disease, unspecified: Secondary | ICD-10-CM | POA: Diagnosis not present

## 2022-02-18 LAB — MAGNESIUM: Magnesium: 1.6 mg/dL — ABNORMAL LOW (ref 1.7–2.4)

## 2022-02-18 LAB — COMPREHENSIVE METABOLIC PANEL
ALT: 24 U/L (ref 0–44)
AST: 31 U/L (ref 15–41)
Albumin: 2.1 g/dL — ABNORMAL LOW (ref 3.5–5.0)
Alkaline Phosphatase: 62 U/L (ref 38–126)
Anion gap: 8 (ref 5–15)
BUN: 36 mg/dL — ABNORMAL HIGH (ref 8–23)
CO2: 31 mmol/L (ref 22–32)
Calcium: 7.9 mg/dL — ABNORMAL LOW (ref 8.9–10.3)
Chloride: 97 mmol/L — ABNORMAL LOW (ref 98–111)
Creatinine, Ser: 0.58 mg/dL (ref 0.44–1.00)
GFR, Estimated: >60 mL/min (ref >60)
Glucose, Bld: 186 mg/dL — ABNORMAL HIGH (ref 70–99)
Potassium: 4 mmol/L (ref 3.5–5.1)
Sodium: 136 mmol/L (ref 135–145)
Total Bilirubin: 0.5 mg/dL (ref 0.3–1.2)
Total Protein: 5.5 g/dL — ABNORMAL LOW (ref 6.5–8.1)

## 2022-02-18 LAB — CBC
HCT: 27.3 % — ABNORMAL LOW (ref 36.0–46.0)
Hemoglobin: 8.6 g/dL — ABNORMAL LOW (ref 12.0–15.0)
MCH: 31.2 pg (ref 26.0–34.0)
MCHC: 31.5 g/dL (ref 30.0–36.0)
MCV: 98.9 fL (ref 80.0–100.0)
Platelets: 204 10*3/uL (ref 150–400)
RBC: 2.76 MIL/uL — ABNORMAL LOW (ref 3.87–5.11)
RDW: 19.3 % — ABNORMAL HIGH (ref 11.5–15.5)
WBC: 5.2 10*3/uL (ref 4.0–10.5)
nRBC: 0 % (ref 0.0–0.2)

## 2022-02-18 LAB — PHOSPHORUS: Phosphorus: 3.6 mg/dL (ref 2.5–4.6)

## 2022-02-18 LAB — BRAIN NATRIURETIC PEPTIDE: B Natriuretic Peptide: 129.6 pg/mL — ABNORMAL HIGH (ref 0.0–100.0)

## 2022-02-18 MED ORDER — METHYLPREDNISOLONE SODIUM SUCC 40 MG IJ SOLR
40.0000 mg | Freq: Every day | INTRAMUSCULAR | Status: DC
  Administered 2022-02-18 – 2022-02-20 (×3): 40 mg via INTRAVENOUS
  Filled 2022-02-18 (×3): qty 1

## 2022-02-18 MED ORDER — MAGNESIUM SULFATE 2 GM/50ML IV SOLN
2.0000 g | Freq: Once | INTRAVENOUS | Status: AC
  Administered 2022-02-18: 2 g via INTRAVENOUS
  Filled 2022-02-18: qty 50

## 2022-02-18 NOTE — Progress Notes (Signed)
Physical Therapy Treatment Patient Details Name: Carol Shaffer MRN: [id number] DOB: 06-09-1945 Today's Date: 02/18/2022   History of Present Illness Carol Shaffer is a 77 y.o. female with medical history significant of NSCLC (s/p of right lower lobe lobectomy, radiation and chemotherapy), COPD, former smoker, stroke, GERD, depression, who presents with fever, SOB.    PT Comments    Pt excited to participate in therapy this am.  Stated she did not get up yesterday but wanting to do so today.  Stated lines and tubes prevented transfer to chair.  Chair was moved to the left side of bed to allow for getting up today.  No assist with bed mobility.  Steady in sitting.  She is able to stand and transfer to recliner at bedside with min guard/assist.  O2 sats 96% at rest.  They do drop briefly to 84% with mobility while talking but when cues to stop and deep breath/rest they quickly return to baseline.  She stated she is doing her HEP on her own and is encouraged to continue to do so.  Pt wishes to remain in chair so further session is deferred to allow her to remain in chair to tolerance.     Recommendations for follow up therapy are one component of a multi-disciplinary discharge planning process, led by the attending physician.  Recommendations may be updated based on patient status, additional functional criteria and insurance authorization.  Follow Up Recommendations  Skilled nursing-short term rehab (<3 hours/day)     Assistance Recommended at Discharge Intermittent Supervision/Assistance  Patient can return home with the following A little help with walking and/or transfers;A little help with bathing/dressing/bathroom;Help with stairs or ramp for entrance;Assist for transportation;Assistance with cooking/housework   Equipment Recommendations  Rolling walker (2 wheels)    Recommendations for Other Services       Precautions / Restrictions Precautions Precautions: Fall Restrictions Weight  Bearing Restrictions: No     Mobility  Bed Mobility Overal bed mobility: Modified Independent               Patient Response: Cooperative  Transfers Overall transfer level: Needs assistance Equipment used: Rolling walker (2 wheels) Transfers: Sit to/from Stand Sit to Stand: Min assist                Ambulation/Gait Ambulation/Gait assistance: Min guard Gait Distance (Feet): 3 Feet Assistive device: Rolling walker (2 wheels) Gait Pattern/deviations: Step-through pattern, Decreased step length - right, Decreased step length - left, Decreased stride length, Narrow base of support Gait velocity: decr     General Gait Details: limited by O2, lines and cords along with dec O2 sats to 84% with transfer but sats do return to baseline with deep breaths   Stairs             Wheelchair Mobility    Modified Rankin (Stroke Patients Only)       Balance Overall balance assessment: Needs assistance Sitting-balance support: Feet supported       Standing balance support: Bilateral upper extremity supported, During functional activity, Reliant on assistive device for balance Standing balance-Leahy Scale: Fair                              Cognition Arousal/Alertness: Awake/alert Behavior During Therapy: WFL for tasks assessed/performed Overall Cognitive Status: Within Functional Limits for tasks assessed  Exercises Other Exercises Other Exercises: reports doing her own HEP in supine    General Comments        Pertinent Vitals/Pain Pain Assessment Pain Assessment: No/denies pain    Home Living                          Prior Function            PT Goals (current goals can now be found in the care plan section) Progress towards PT goals: Progressing toward goals    Frequency    Min 2X/week      PT Plan Current plan remains appropriate    Co-evaluation               AM-PAC PT "6 Clicks" Mobility   Outcome Measure  Help needed turning from your back to your side while in a flat bed without using bedrails?: None Help needed moving from lying on your back to sitting on the side of a flat bed without using bedrails?: None Help needed moving to and from a bed to a chair (including a wheelchair)?: A Little Help needed standing up from a chair using your arms (e.g., wheelchair or bedside chair)?: A Little Help needed to walk in hospital room?: A Little Help needed climbing 3-5 steps with a railing? : A Lot 6 Click Score: 19    End of Session Equipment Utilized During Treatment: Oxygen Activity Tolerance: Patient tolerated treatment well Patient left: in chair;with call bell/phone within reach;with chair alarm set;with family/visitor present Nurse Communication: Mobility status PT Visit Diagnosis: Unsteadiness on feet (R26.81);Other abnormalities of gait and mobility (R26.89);Muscle weakness (generalized) (M62.81);Difficulty in walking, not elsewhere classified (R26.2)     Time: 3199-1906 PT Time Calculation (min) (ACUTE ONLY): 20 min  Charges:  $Therapeutic Activity: 8-22 mins                   Danielle Dess, PTA 02/18/22, 12:17 PM

## 2022-02-18 NOTE — Progress Notes (Signed)
Progress Note   Patient: Carol Shaffer WFU:[id number] DOB: 09-10-1945 DOA: 02/12/2022     6 DOS: the patient was seen and examined on 02/18/2022   Brief hospital course: HPI on admission per Dr. Clyde Lundborg: "Carol Shaffer is a 77 y.o. female with medical history significant of NSCLC (s/p of right lower lobe lobectomy, radiation and chemotherapy), COPD, former smoker, stroke, GERD, depression, who presents with fever, SOB.   Patient states that she has fever, chills, cough, shortness breath for more than 3 days, which has been progressively worsening.  Patient has cough with brownish colored sputum production.  Patient normally is not using oxygen, but was found to have oxygen desaturation to 83% on room air, which initally improved to 92-93% on 4 L oxygen in ED, but deteriorated with worsening shortness of breath, using accessory muscle for breathing, started BiPAP in ED. Patient does not have nausea, vomiting, diarrhea or abdominal pain.  No symptoms of UTI.  She states that she had mild wheezing at home, which has resolved.   Data reviewed independently and ED Course: pt was found to have WBC 11.6, negative PCR for COVID and flu, potassium 3.0, GFR> 60, temperature 101, blood pressure 118/97, heart rate 130s, RR 23.  Patient is admitted to PCU as inpatient."  Imaging included CXR and CTA chest which was negative for PE but showed extensive multifocal pneumonia.   1/16: Pt remains very dyspneic with borderline O2 sats on 10-15 L HFNC oxygen.  Sinus tachycardia related to her respiratory issues.  MRSA screen negative, stopped Vancomycin.  Continuing Cefepime. 1/17.  Patient on 10 L high flow nasal cannula. 1/18.  Patient on 13 L high flow nasal cannula this afternoon 1/19.  Patient on 11 L high flow nasal cannula this morning.  Added Tussionex for cough. 1/20.  Patient on 100% nonrebreather this morning but this afternoon on heated high flow nasal cannula 100% FiO2.  Patient agreeable to a dose of Lasix  and a dose of Solu-Medrol. 1/21.  Taper down to 80% heated high flow nasal cannula this morning.  Continue Solu-Medrol.  Assessment and Plan: * Multifocal pneumonia Severe Sepsis - POA with tachycardia, tachypnea, fever and lactic acidosis.  Patient also has acute hypoxic respiratory failure. -- Completed Rocephin and continue doxycycline 7-day course --Incentive spirometer    Severe sepsis (HCC) As above  Acute respiratory failure with hypoxia (HCC) With multifocal pneumonia.  Patient with worsening oxygenation on 1/20 heated high flow nasal cannula 100% oxygen and 45 L.  On 1/21 Taper down to 80% FiO2.  Started steroids yesterday.  COPD (chronic obstructive pulmonary disease) (HCC) Bronchodilators.  Steroids started on 1/20.   Sinus tachycardia Standing dose p.o. Cardizem every 6 hours.   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  Patient is on radiation and chemotherapy, last chemo was 01/08/2022   Depression Continue home meds  Hypomagnesemia Replace IV magnesium today  Hypokalemia Replaced in normal range.  Continue supplementation.  Gastric ulcer Continue Protonix, Carafate  Protein-calorie malnutrition, severe (HCC) Underweight with a BMI of 13.13    Hypophosphatemia Replaced yesterday.  E. coli UTI Treated with Rocephin  DNR (do not resuscitate) discussion Confirmed DNR status with increasing oxygen requirements.  Iron deficiency anemia Last hemoglobin 8.6.        Subjective: Patient feels like she is breathing better.  Did not have any issue with the steroids last night.  Was on 100% heated high flow nasal cannula  this morning and tapered down to 80% when I saw her.  Admitted with pneumonia.  Physical Exam: Vitals:   02/18/22 0623 02/18/22 0735 02/18/22 0850 02/18/22 1226  BP:  (!) 104/47  112/65  Pulse:  97  100  Resp: 13 18    Temp:  98.2 F (36.8 C)  97.9 F (36.6 C)  TempSrc:  Oral     SpO2: 100% 96% 95% 97%  Weight:      Height:       Physical Exam HENT:     Head: Normocephalic.     Mouth/Throat:     Pharynx: No oropharyngeal exudate.  Eyes:     General: Lids are normal.     Conjunctiva/sclera: Conjunctivae normal.  Cardiovascular:     Rate and Rhythm: Normal rate and regular rhythm.     Heart sounds: Normal heart sounds, S1 normal and S2 normal.  Pulmonary:     Breath sounds: Examination of the right-lower field reveals decreased breath sounds. Examination of the left-lower field reveals decreased breath sounds. Decreased breath sounds present. No wheezing, rhonchi or rales.  Abdominal:     Palpations: Abdomen is soft.     Tenderness: There is no abdominal tenderness.  Musculoskeletal:     Right lower leg: No swelling.     Left lower leg: No swelling.  Skin:    General: Skin is warm.     Findings: No rash.  Neurological:     Mental Status: She is alert and oriented to person, place, and time.     Data Reviewed: Magnesium 1.6, creatinine 0.58, hemoglobin 8.6  Family Communication: Left message for patient's son  Disposition: Status is: Inpatient Remains inpatient appropriate because: Patient on heated high flow nasal cannula Taper down to 80% FiO2  Planned Discharge Destination: To be determined    Time spent: 28 minutes  Author: Alford Highland, MD 02/18/2022 1:31 PM  For on call review [email or web address].

## 2022-02-19 ENCOUNTER — Ambulatory Visit: Payer: Medicare Other

## 2022-02-19 ENCOUNTER — Inpatient Hospital Stay: Payer: Medicare Other

## 2022-02-19 DIAGNOSIS — J189 Pneumonia, unspecified organism: Secondary | ICD-10-CM | POA: Diagnosis not present

## 2022-02-19 DIAGNOSIS — J9601 Acute respiratory failure with hypoxia: Secondary | ICD-10-CM | POA: Diagnosis not present

## 2022-02-19 DIAGNOSIS — A419 Sepsis, unspecified organism: Secondary | ICD-10-CM | POA: Diagnosis not present

## 2022-02-19 DIAGNOSIS — J449 Chronic obstructive pulmonary disease, unspecified: Secondary | ICD-10-CM | POA: Diagnosis not present

## 2022-02-19 LAB — CBC
HCT: 28 % — ABNORMAL LOW (ref 36.0–46.0)
Hemoglobin: 8.7 g/dL — ABNORMAL LOW (ref 12.0–15.0)
MCH: 30.7 pg (ref 26.0–34.0)
MCHC: 31.1 g/dL (ref 30.0–36.0)
MCV: 98.9 fL (ref 80.0–100.0)
Platelets: 217 10*3/uL (ref 150–400)
RBC: 2.83 MIL/uL — ABNORMAL LOW (ref 3.87–5.11)
RDW: 19.2 % — ABNORMAL HIGH (ref 11.5–15.5)
WBC: 9.3 10*3/uL (ref 4.0–10.5)
nRBC: 0 % (ref 0.0–0.2)

## 2022-02-19 LAB — CULTURE, RESPIRATORY W GRAM STAIN

## 2022-02-19 LAB — BASIC METABOLIC PANEL
Anion gap: 7 (ref 5–15)
BUN: 34 mg/dL — ABNORMAL HIGH (ref 8–23)
CO2: 32 mmol/L (ref 22–32)
Calcium: 8.3 mg/dL — ABNORMAL LOW (ref 8.9–10.3)
Chloride: 99 mmol/L (ref 98–111)
Creatinine, Ser: 0.49 mg/dL (ref 0.44–1.00)
GFR, Estimated: >60 mL/min (ref >60)
Glucose, Bld: 107 mg/dL — ABNORMAL HIGH (ref 70–99)
Potassium: 4.4 mmol/L (ref 3.5–5.1)
Sodium: 138 mmol/L (ref 135–145)

## 2022-02-19 LAB — MAGNESIUM: Magnesium: 1.8 mg/dL (ref 1.7–2.4)

## 2022-02-19 MED ORDER — MAGNESIUM SULFATE 2 GM/50ML IV SOLN
2.0000 g | Freq: Once | INTRAVENOUS | Status: AC
  Administered 2022-02-19: 2 g via INTRAVENOUS
  Filled 2022-02-19: qty 50

## 2022-02-19 NOTE — Progress Notes (Signed)
TOC acknowledges SNF recommendation. Patient currently on 35L heated HFNC, will need to wean to 6L or below to be appropriate for SNF. Pending medical improvement at this time.   Darolyn Rua, [place], MSW, [place] [phone]

## 2022-02-19 NOTE — Progress Notes (Signed)
Physical Therapy Treatment Patient Details Name: Carol Shaffer MRN: [id number] DOB: 06-24-1945 Today's Date: 02/19/2022   History of Present Illness Carol Shaffer is a 77 y.o. female with medical history significant of NSCLC (s/p of right lower lobe lobectomy, radiation and chemotherapy), COPD, former smoker, stroke, GERD, depression, who presents with fever, SOB.    PT Comments    Pt ready for session.  Stated she does not want to get in the chair like yesterday because it was too challenging getting back with lines/tubes but is excited to participate in session.  She is able to move supine <-> sit with supervision with guidance for tubes.  She is steady in sitting and sits +30 minutes edge of bed during session.  She is able to stand x 5 for varied lengths while doing standing ex x n10 with walker support.  Seated rest breaks as needed and BLE AROM x 10.  Stands up to 2 minutes max during attempts.  Sats remain generally in high 90's with activity today and O2 support.  Overall progressing well and pleased with her progress today in session.  Returned to supine due to fatigue.     Recommendations for follow up therapy are one component of a multi-disciplinary discharge planning process, led by the attending physician.  Recommendations may be updated based on patient status, additional functional criteria and insurance authorization.  Follow Up Recommendations  Skilled nursing-short term rehab (<3 hours/day)     Assistance Recommended at Discharge Intermittent Supervision/Assistance  Patient can return home with the following A little help with walking and/or transfers;A little help with bathing/dressing/bathroom;Help with stairs or ramp for entrance;Assist for transportation;Assistance with cooking/housework   Equipment Recommendations  Rolling walker (2 wheels)    Recommendations for Other Services       Precautions / Restrictions Precautions Precautions: Fall Restrictions Weight  Bearing Restrictions: No     Mobility  Bed Mobility Overal bed mobility: Modified Independent               Patient Response: Cooperative  Transfers Overall transfer level: Modified independent Equipment used: Rolling walker (2 wheels) Transfers: Sit to/from Stand Sit to Stand: Min guard                Ambulation/Gait         Gait velocity: decr     General Gait Details: limited by O2 tubing.  sats remain >90% during session.  mostly in high 90's with activity.   Stairs             Wheelchair Mobility    Modified Rankin (Stroke Patients Only)       Balance Overall balance assessment: Needs assistance Sitting-balance support: Feet supported Sitting balance-Leahy Scale: Good Sitting balance - Comments: uses walker at times for support in front of her for security and energy conservation   Standing balance support: Bilateral upper extremity supported, During functional activity, Reliant on assistive device for balance Standing balance-Leahy Scale: Fair                              Cognition Arousal/Alertness: Awake/alert Behavior During Therapy: WFL for tasks assessed/performed Overall Cognitive Status: Within Functional Limits for tasks assessed                                          Exercises  Other Exercises Other Exercises: standing ex with BUE support and BLE AROM in sitting    General Comments        Pertinent Vitals/Pain Pain Assessment Pain Assessment: No/denies pain    Home Living                          Prior Function            PT Goals (current goals can now be found in the care plan section) Progress towards PT goals: Progressing toward goals    Frequency    Min 2X/week      PT Plan Current plan remains appropriate    Co-evaluation              AM-PAC PT "6 Clicks" Mobility   Outcome Measure  Help needed turning from your back to your side while in a  flat bed without using bedrails?: None Help needed moving from lying on your back to sitting on the side of a flat bed without using bedrails?: None Help needed moving to and from a bed to a chair (including a wheelchair)?: A Little Help needed standing up from a chair using your arms (e.g., wheelchair or bedside chair)?: A Little Help needed to walk in hospital room?: A Little Help needed climbing 3-5 steps with a railing? : A Lot 6 Click Score: 19    End of Session Equipment Utilized During Treatment: Oxygen Activity Tolerance: Patient tolerated treatment well Patient left: in bed;with bed alarm set;with call bell/phone within reach Nurse Communication: Mobility status PT Visit Diagnosis: Unsteadiness on feet (R26.81);Other abnormalities of gait and mobility (R26.89);Muscle weakness (generalized) (M62.81);Difficulty in walking, not elsewhere classified (R26.2)     Time: 9135-1670 PT Time Calculation (min) (ACUTE ONLY): 40 min  Charges:  $Therapeutic Exercise: 8-22 mins $Therapeutic Activity: 23-37 mins                   Danielle Dess, PTA 02/19/22, 2:53 PM

## 2022-02-19 NOTE — Care Management Important Message (Signed)
Important Message  Patient Details  Name: Carol Shaffer MRN: [id number] Date of Birth: 02-Dec-1945   Medicare Important Message Given:  Yes     [name] 02/19/2022, 10:57 AM

## 2022-02-19 NOTE — Progress Notes (Signed)
Progress Note   Patient: Carol Shaffer IOP:[id number] DOB: [birth year] DOA: 02/12/2022     7 DOS: the patient was seen and examined on 02/19/2022   Brief hospital course: HPI on admission per Dr. Clyde Lundborg: "Carol Shaffer is a 77 y.o. female with medical history significant of NSCLC (s/p of right lower lobe lobectomy, radiation and chemotherapy), COPD, former smoker, stroke, GERD, depression, who presents with fever, SOB.   Patient states that she has fever, chills, cough, shortness breath for more than 3 days, which has been progressively worsening.  Patient has cough with brownish colored sputum production.  Patient normally is not using oxygen, but was found to have oxygen desaturation to 83% on room air, which initally improved to 92-93% on 4 L oxygen in ED, but deteriorated with worsening shortness of breath, using accessory muscle for breathing, started BiPAP in ED. Patient does not have nausea, vomiting, diarrhea or abdominal pain.  No symptoms of UTI.  She states that she had mild wheezing at home, which has resolved.   Data reviewed independently and ED Course: pt was found to have WBC 11.6, negative PCR for COVID and flu, potassium 3.0, GFR> 60, temperature 101, blood pressure 118/97, heart rate 130s, RR 23.  Patient is admitted to PCU as inpatient."  Imaging included CXR and CTA chest which was negative for PE but showed extensive multifocal pneumonia.   1/16: Pt remains very dyspneic with borderline O2 sats on 10-15 L HFNC oxygen.  Sinus tachycardia related to her respiratory issues.  MRSA screen negative, stopped Vancomycin.  Continuing Cefepime. 1/17.  Patient on 10 L high flow nasal cannula. 1/18.  Patient on 13 L high flow nasal cannula this afternoon 1/19.  Patient on 11 L high flow nasal cannula this morning.  Added Tussionex for cough. 1/20.  Patient on 100% nonrebreather this morning but this afternoon on heated high flow nasal cannula 100% FiO2.  Patient agreeable to a dose of Lasix  and a dose of Solu-Medrol. 1/21.  Taper down to 80% heated high flow nasal cannula this morning.  Continue Solu-Medrol. 1/22.  Down to 60% heated high flow nasal cannula 35 L flow today.  Continuing Solu-Medrol.  Assessment and Plan: * Multifocal pneumonia Severe Sepsis - POA with tachycardia, tachypnea, fever and lactic acidosis.  Patient also has acute hypoxic respiratory failure. -- Completed Rocephin and continue doxycycline 7-day course --Incentive spirometer    Severe sepsis (HCC) As above  Acute respiratory failure with hypoxia (HCC) With multifocal pneumonia.  Patient with worsening oxygenation on 1/20 heated high flow nasal cannula 100% oxygen and 45 L.  On 1/21 Taper down to 80% FiO2.  Started steroids yesterday.  COPD (chronic obstructive pulmonary disease) (HCC) Bronchodilators.  Steroids started on 1/20.   Sinus tachycardia Standing dose p.o. Cardizem every 6 hours.   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  Patient is on radiation and chemotherapy, last chemo was 01/08/2022   Depression Continue home meds  Hypomagnesemia Replace IV magnesium today  Hypokalemia Replaced in normal range.  Continue supplementation.  Gastric ulcer Continue Protonix, Carafate  Protein-calorie malnutrition, severe (HCC) Underweight with a BMI of 13.13    Hypophosphatemia Replaced yesterday.  E. coli UTI Treated with Rocephin  DNR (do not resuscitate) discussion Confirmed DNR status with increasing oxygen requirements.  Iron deficiency anemia Last hemoglobin 8.6.        Subjective: Patient feeling better.  She states she is breathing better.  Willing to do what ever it takes to improve.  She states she sat in the chair yesterday for quite a few hours.  Admitted with pneumonia and acute respiratory failure.  Physical Exam: Vitals:   02/19/22 0802 02/19/22 0837 02/19/22 1252 02/19/22 1416  BP:  115/64 101/60    Pulse:  94 100   Resp:  18 18   Temp:  98.6 F (37 C) 97.8 F (36.6 C)   TempSrc:  Oral    SpO2: 97% 93% (!) 89% 92%  Weight:      Height:       Physical Exam HENT:     Head: Normocephalic.     Mouth/Throat:     Pharynx: No oropharyngeal exudate.  Eyes:     General: Lids are normal.     Conjunctiva/sclera: Conjunctivae normal.  Cardiovascular:     Rate and Rhythm: Normal rate and regular rhythm.     Heart sounds: Normal heart sounds, S1 normal and S2 normal.  Pulmonary:     Breath sounds: Examination of the right-lower field reveals decreased breath sounds. Examination of the left-lower field reveals decreased breath sounds. Decreased breath sounds present. No wheezing, rhonchi or rales.  Abdominal:     Palpations: Abdomen is soft.     Tenderness: There is no abdominal tenderness.  Musculoskeletal:     Right lower leg: No swelling.     Left lower leg: No swelling.  Skin:    General: Skin is warm.     Findings: No rash.  Neurological:     Mental Status: She is alert and oriented to person, place, and time.     Data Reviewed: Hemoglobin 8.7, creatinine 0.49, magnesium 1.8  Family Communication: Updated patient's son on the phone  Disposition: Status is: Inpatient Remains inpatient appropriate because: Still on heated high flow nasal cannula 60% FiO2 and 35 L flow  Planned Discharge Destination: Rehab versus LTAC    Time spent: 28 minutes  Author: Alford Highland, MD 02/19/2022 3:37 PM  For on call review [email or web address].

## 2022-02-20 ENCOUNTER — Ambulatory Visit: Payer: Medicare Other

## 2022-02-20 DIAGNOSIS — A419 Sepsis, unspecified organism: Secondary | ICD-10-CM | POA: Diagnosis not present

## 2022-02-20 DIAGNOSIS — J9601 Acute respiratory failure with hypoxia: Secondary | ICD-10-CM | POA: Diagnosis not present

## 2022-02-20 DIAGNOSIS — J189 Pneumonia, unspecified organism: Secondary | ICD-10-CM | POA: Diagnosis not present

## 2022-02-20 DIAGNOSIS — J449 Chronic obstructive pulmonary disease, unspecified: Secondary | ICD-10-CM | POA: Diagnosis not present

## 2022-02-20 MED ORDER — DOXYCYCLINE HYCLATE 100 MG PO TABS: 100.0000 mg | ORAL_TABLET | Freq: Two times a day (BID) | ORAL | 0 refills | Status: AC

## 2022-02-20 MED ORDER — IPRATROPIUM-ALBUTEROL 0.5-2.5 (3) MG/3ML IN SOLN: 3.0000 mL | Freq: Four times a day (QID) | RESPIRATORY_TRACT | 0 refills | Status: DC

## 2022-02-20 MED ORDER — HYDROCODONE-ACETAMINOPHEN 5-325 MG PO TABS: 1.0000 | ORAL_TABLET | Freq: Four times a day (QID) | ORAL | 0 refills | Status: DC | PRN

## 2022-02-20 MED ORDER — DM-GUAIFENESIN ER 30-600 MG PO TB12: 1.0000 | ORAL_TABLET | Freq: Two times a day (BID) | ORAL | 0 refills | Status: DC

## 2022-02-20 MED ORDER — LACTULOSE 10 GM/15ML PO SOLN: 30.0000 g | Freq: Every day | ORAL | 0 refills | Status: AC | PRN

## 2022-02-20 MED ORDER — PREDNISONE 10 MG PO TABS: ORAL_TABLET | ORAL | 0 refills | Status: DC

## 2022-02-20 MED ORDER — DILTIAZEM HCL 60 MG PO TABS: 60.0000 mg | ORAL_TABLET | Freq: Four times a day (QID) | ORAL | 0 refills | Status: DC

## 2022-02-20 MED ORDER — HYDROCOD POLI-CHLORPHE POLI ER 10-8 MG/5ML PO SUER: 5.0000 mL | Freq: Two times a day (BID) | ORAL | 0 refills | Status: DC | PRN

## 2022-02-20 MED ORDER — HEPARIN SOD (PORK) LOCK FLUSH 100 UNIT/ML IV SOLN
500.0000 [IU] | INTRAVENOUS | Status: AC | PRN
  Administered 2022-02-20: 500 [IU]

## 2022-02-20 NOTE — TOC Initial Note (Signed)
Transition of Care [facility]) - Initial/Assessment Note    Patient Details  Name: Carol Shaffer MRN: [id number] Date of Birth: 27-Jan-1946  Transition of Care [facility]) CM/SW Contact:    Darolyn Rua, LCSW Phone Number: 02/20/2022, 11:23 AM  Clinical Narrative:                  CSW spoke with patient regarding potential for LTAC if agreeable. Patient is agreeable for referral to be sent to [facility] in [place], CSW has coordinated with Mickle Asper with [facility] for referral. They will review if they can offer a bed.   Pending bed acceptance at [facility] at this time.   Expected Discharge Plan:  (TBD) Barriers to Discharge: Continued Medical Work up   Patient Goals and CMS Choice Patient states their goals for this hospitalization and ongoing recovery are:: to go home CMS Medicare.gov Compare Post Acute Care list provided to:: Patient Choice offered to / list presented to : Patient      Expected Discharge Plan and Services       Living arrangements for the past 2 months: Single Family Home                                      Prior Living Arrangements/Services Living arrangements for the past 2 months: Single Family Home Lives with:: Self                   Activities of Daily Living Home Assistive Devices/Equipment: Environmental consultant (specify type) ADL Screening (condition at time of admission) Patient's cognitive ability adequate to safely complete daily activities?: Yes Is the patient deaf or have difficulty hearing?: No Does the patient have difficulty seeing, even when wearing glasses/contacts?: Yes (macular degeneration; glaucoma; hx stroke) Does the patient have difficulty concentrating, remembering, or making decisions?: No Patient able to express need for assistance with ADLs?: Yes Does the patient have difficulty dressing or bathing?: No Independently performs ADLs?: Yes (appropriate for developmental age) Does the patient have difficulty walking or climbing  stairs?: Yes (at this current time due to pneumonia) Weakness of Legs: Both Weakness of Arms/Hands: Both  Permission Sought/Granted                  Emotional Assessment              Admission diagnosis:  Shortness of breath [R06.02] Hypokalemia [E87.6] Acute respiratory failure with hypoxia (HCC) [J96.01] HCAP (healthcare-associated pneumonia) [J18.9] 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   Weight loss, unintentional 09/17/2018   Generalized abdominal pain 09/11/2018   Stenosis  of gastrointestinal structure (HCC) 09/11/2018   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   Abdominal pain, LUQ 08/13/2018   Abnormal computed tomography of small intestine 08/13/2018   Positive fecal occult blood test 11/17/2017   Iron deficiency anemia 08/08/2017   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 [place] [address] Phone: [phone] Fax: [phone]     Social Determinants of Health (SDOH) Social History: SDOH Screenings   Food Insecurity: No Food Insecurity (02/13/2022)  Housing: Low Risk  (02/13/2022)  Transportation Needs: Unmet Transportation Needs (02/13/2022)  Utilities: Not At Risk (02/13/2022)  Tobacco Use: Medium Risk (02/12/2022)   SDOH Interventions: Food Insecurity Interventions: Intervention Not Indicated Housing Interventions: Intervention Not Indicated Transportation Interventions: [facility] Zenaida Niece ([facility] Only) Utilities Interventions: Intervention Not Indicated   Readmission Risk Interventions     No data to display

## 2022-02-20 NOTE — Progress Notes (Signed)
Report called to Dava Najjar., RN of [facility].  All questions answered.  Patient packed up, IV team consulted to heparin lock pt.'s port, pt.'s belongings packed up.  Awaiting transport

## 2022-02-20 NOTE — Progress Notes (Addendum)
Physical Therapy Treatment Patient Details Name: Carol Shaffer MRN: [id number] DOB: 07-03-1945 Today's Date: 02/20/2022   History of Present Illness Carol Shaffer is a 77 y.o. female with medical history significant of NSCLC (s/p of right lower lobe lobectomy, radiation and chemotherapy), COPD, former smoker, stroke, GERD, depression, who presents with fever, SOB.    PT Comments    Pt sitting EOB eating breakfast upon arrival. Stated she has been sitting for a long time.  Tech in stated she walked a short distance by bed limited by O2 tubing about 6' prior to my arrival.  Pt does ask to get washed up.  She is able to walk 3' to sink for standing bathing with seated rests as needed.  Assisted with clothing change.  She does have some decreased sats to 85% at times and rest is encouraged as needed with deep breathing to recover.    Pt with overall improvement and tolerance to session today.  LTAC has been discussed with pt given continued high O2 needs.  She does not want to go to [facility] but is open to [facility] as son is a Runner, broadcasting/film/video in [employer] and they both live in [place].  Relayed to team.    If pt's O2 needs decrease to a level that SNF can manage, will adjust recommendations back to SNF. Discussed with team.   Recommendations for follow up therapy are one component of a multi-disciplinary discharge planning process, led by the attending physician.  Recommendations may be updated based on patient status, additional functional criteria and insurance authorization.  Follow Up Recommendations  PT at [facility]-term acute care hospital     Assistance Recommended at Discharge Intermittent Supervision/Assistance  Patient can return home with the following A little help with walking and/or transfers;A little help with bathing/dressing/bathroom;Help with stairs or ramp for entrance;Assist for transportation;Assistance with cooking/housework   Equipment Recommendations  Rolling walker (2  wheels)    Recommendations for Other Services       Precautions / Restrictions Precautions Precautions: Fall Restrictions Weight Bearing Restrictions: No     Mobility  Bed Mobility Overal bed mobility: Modified Independent               Patient Response: Cooperative  Transfers Overall transfer level: Modified independent Equipment used: Rolling walker (2 wheels) Transfers: Sit to/from Stand                  Ambulation/Gait Ambulation/Gait assistance: Land (Feet): 3 Feet Assistive device: Rolling walker (2 wheels) Gait Pattern/deviations: Step-through pattern, Decreased step length - right, Decreased step length - left, Decreased stride length, Narrow base of support Gait velocity: decr     General Gait Details: limited by O2 tubing.   Stairs             Wheelchair Mobility    Modified Rankin (Stroke Patients Only)       Balance Overall balance assessment: Needs assistance Sitting-balance support: Feet supported Sitting balance-Leahy Scale: Good     Standing balance support: Bilateral upper extremity supported, During functional activity, Reliant on assistive device for balance Standing balance-Leahy Scale: Fair                              Cognition Arousal/Alertness: Awake/alert Behavior During Therapy: WFL for tasks assessed/performed Overall Cognitive Status: Within Functional Limits for tasks assessed  Exercises Other Exercises Other Exercises: standing for bathing at sink    General Comments        Pertinent Vitals/Pain Pain Assessment Pain Assessment: No/denies pain    Home Living                          Prior Function            PT Goals (current goals can now be found in the care plan section) Progress towards PT goals: Progressing toward goals    Frequency    Min 2X/week      PT Plan Discharge plan  needs to be updated    Co-evaluation              AM-PAC PT "6 Clicks" Mobility   Outcome Measure  Help needed turning from your back to your side while in a flat bed without using bedrails?: None Help needed moving from lying on your back to sitting on the side of a flat bed without using bedrails?: None Help needed moving to and from a bed to a chair (including a wheelchair)?: A Little Help needed standing up from a chair using your arms (e.g., wheelchair or bedside chair)?: A Little Help needed to walk in hospital room?: A Little Help needed climbing 3-5 steps with a railing? : A Lot 6 Click Score: 19    End of Session Equipment Utilized During Treatment: Oxygen Activity Tolerance: Patient tolerated treatment well Patient left: in bed;with bed alarm set;with call bell/phone within reach;with family/visitor present Nurse Communication: Mobility status PT Visit Diagnosis: Unsteadiness on feet (R26.81);Other abnormalities of gait and mobility (R26.89);Muscle weakness (generalized) (M62.81);Difficulty in walking, not elsewhere classified (R26.2)     Time: 6490-4250 PT Time Calculation (min) (ACUTE ONLY): 27 min  Charges:  $Therapeutic Activity: 23-37 mins                   Danielle Dess, PTA 02/20/22, 11:31 AM

## 2022-02-20 NOTE — TOC Transition Note (Addendum)
Transition of Care [facility]) - CM/SW Discharge Note   Patient Details  Name: Carol Shaffer MRN: [id number] Date of Birth: [birth year]  Transition of Care [facility]) CM/SW Contact:  Darolyn Rua, LCSW Phone Number: 02/20/2022, 2:43 PM   Clinical Narrative:     Patient to [facility].  MD to call [phone] to give MD handoff.   RN will also need to call report to same number 30 min prior to transfer.   The accepting doc is Dr Sheryle Hail.   d/c summary to be faxed to [phone] ATTN Robin   Room 6200  [facility] is able to transport at 10:30 (phone: [phone]) pm this evening, confirmed with Candise Bowens with [facility] in [place] this is acceptable. Treatment team made aware. [facility] aware of patient's insurance and oxygen needs.   Final next level of care: Long Term Acute Care (LTAC) Barriers to Discharge: No Barriers Identified   Patient Goals and CMS Choice CMS Medicare.gov Compare Post Acute Care list provided to:: Patient Choice offered to / list presented to : Patient  Discharge Placement                      Patient and family notified of of transfer: 02/20/22  Discharge Plan and Services Additional resources added to the After Visit Summary for                                       Social Determinants of Health (SDOH) Interventions SDOH Screenings   Food Insecurity: No Food Insecurity (02/13/2022)  Housing: Low Risk  (02/13/2022)  Transportation Needs: Unmet Transportation Needs (02/13/2022)  Utilities: Not At Risk (02/13/2022)  Tobacco Use: Medium Risk (02/12/2022)     Readmission Risk Interventions     No data to display

## 2022-02-20 NOTE — Discharge Summary (Signed)
Physician Discharge Summary   Patient: Carol Shaffer MRN: [id number] DOB: [birth year]  Admit date:     02/12/2022  Discharge date: 02/20/22  Discharge Physician: Alford Highland   PCP: Jaclyn Shaggy, MD   Recommendations at discharge:   [facility]-up with [facility] and OT [facility]  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 coli UTI   Hypophosphatemia   Hospital Course: HPI on admission per Dr. Clyde Lundborg: "Carol Shaffer is a 77 y.o. female with medical history significant of NSCLC (s/p of right lower lobe lobectomy, radiation and chemotherapy), COPD, former smoker, stroke, GERD, depression, who presents with fever, SOB.   Patient states that she has fever, chills, cough, shortness breath for more than 3 days, which has been progressively worsening.  Patient has cough with brownish colored sputum production.  Patient normally is not using oxygen, but was found to have oxygen desaturation to 83% on room air, which initally improved to 92-93% on 4 L oxygen in ED, but deteriorated with worsening shortness of breath, using accessory muscle for breathing, started BiPAP in ED. Patient does not have nausea, vomiting, diarrhea or abdominal pain.  No symptoms of UTI.  She states that she had mild wheezing at home, which has resolved.   Data reviewed independently and ED Course: pt was found to have WBC 11.6, negative PCR for COVID and flu, potassium 3.0, GFR> 60, temperature 101, blood pressure 118/97, heart rate 130s, RR 23.  Patient is admitted to PCU as inpatient."  Imaging included CXR and CTA  chest which was negative for PE but showed extensive multifocal pneumonia.   1/16: Pt remains very dyspneic with borderline O2 sats on 10-15 L HFNC oxygen.  Sinus tachycardia related to her respiratory issues.  MRSA screen negative, stopped Vancomycin.  Continuing Cefepime. 1/17.  Patient on 10 L high flow nasal cannula. 1/18.  Patient on 13 L high flow nasal cannula this afternoon 1/19.  Patient on 11 L high flow nasal cannula this morning.  Added Tussionex for cough. 1/20.  Patient on 100% nonrebreather this morning but this afternoon on heated high flow nasal cannula 100% FiO2.  Patient agreeable to a dose of Lasix and a dose of Solu-Medrol. 1/21.  Taper down to 80% heated high flow nasal cannula this morning.  Continue Solu-Medrol. 1/22.  Down to 60% heated high flow nasal cannula 35 L flow today.  Continuing Solu-Medrol. 1/23.  Ranging between 49% and 57% FiO2 on heated high flow nasal cannula with 35 L flow.  [facility] was accepted to [facility] this afternoon and has accepted the bed.  The patient does not wear oxygen at home and her goal would be to go home.  Replacing electrolytes during the hospital course.  Assessment and Plan: * Multifocal pneumonia Severe Sepsis - POA with tachycardia, tachypnea, fever and lactic acidosis.  Patient also has acute hypoxic respiratory failure. --Completed Rocephin and continue doxycycline 7-day course (3 more doses) --Incentive spirometer    Severe sepsis (HCC) As above  Acute respiratory failure with hypoxia (HCC) With multifocal pneumonia.  Patient with worsening  oxygenation on 1/20 heated high flow nasal cannula 100% oxygen and 45 L.  On 1/21 Taper down to 80% FiO2.  On 1/22 Taper down to 60% FiO2.  On 1/23 ranging between 49 and 57% FiO2 on heated high flow nasal cannula 35 L flow.  The patient does not wear oxygen at home.  COPD (chronic obstructive pulmonary disease) (HCC) Bronchodilators.  Steroids started on 1/20.  Today is day 4 for  Solu-Medrol will switch over to prednisone taper for tomorrow.  (If the patient does complain of mouth pain and not eating as likely secondary to the steroids and then steroids can be stopped).   Sinus tachycardia Standing dose p.o. Cardizem every 6 hours.   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  Patient is on radiation and chemotherapy, last chemo was 01/08/2022   Depression Continue home meds  Hypomagnesemia Continue oral magnesium.  Recommend checking magnesium tomorrow.  Hypokalemia Replaced in normal range.  Continue supplementation.  Recommend checking potassium tomorrow.  Gastric ulcer Continue Protonix, Carafate  Protein-calorie malnutrition, severe (HCC) Underweight with a BMI of 13.13    Hypophosphatemia Replaced.  Recommend checking phosphorus tomorrow.  E. coli UTI Completed treatment with Rocephin  Iron deficiency anemia Last hemoglobin 8.7.  Constipation on Colace.  Lactulose as needed.       Consultants: Oncology Procedures performed: none Disposition: [facility] [place] Diet recommendation:  Regular DISCHARGE MEDICATION: Allergies as of 02/20/2022       Reactions   Hydromorphone Hcl Itching   Bentyl [dicyclomine] Nausea And Vomiting   Biaxin [clarithromycin] Other (See Comments)   hallucinations   Budesonide Other (See Comments)   Ulcer   Erythromycin Nausea And Vomiting   Reglan [metoclopramide] Other (See Comments)   tremors        Medication List     STOP taking these medications    dexamethasone 2 MG tablet Commonly known as: DECADRON   lansoprazole 30 MG capsule Commonly known as: PREVACID   NON FORMULARY   oxyCODONE 5 MG immediate release tablet Commonly known as: Oxy IR/ROXICODONE       TAKE these medications    acetaminophen 500 MG tablet Commonly known as: TYLENOL Take 500 mg by mouth every 6 (six) hours as needed.   chlorpheniramine-HYDROcodone 10-8  MG/5ML Commonly known as: TUSSIONEX Take 5 mLs by mouth every 12 (twelve) hours as needed for cough.   Combigan 0.2-0.5 % ophthalmic solution Generic drug: brimonidine-timolol INT 1 GTT IN OU BID   dextromethorphan-guaiFENesin 30-600 MG 12hr tablet Commonly known as: MUCINEX DM Take 1 tablet by mouth 2 (two) times daily.   diltiazem 60 MG tablet Commonly known as: CARDIZEM Take 1 tablet (60 mg total) by mouth every 6 (six) hours.   docusate 50 MG/5ML liquid Commonly known as: COLACE Take by mouth daily.   doxycycline 100 MG tablet Commonly known as: VIBRA-TABS Take 1 tablet (100 mg total) by mouth every 12 (twelve) hours for 3 doses.   erythromycin ophthalmic ointment Place 1 Application into both eyes at bedtime.   fluticasone 50 MCG/ACT nasal spray Commonly known as: FLONASE Place 2 sprays into both nostrils daily.   HYDROcodone-acetaminophen 5-325 MG tablet Commonly known as: NORCO/VICODIN Take 1 tablet by mouth every 6 (six) hours as needed for moderate pain.   ipratropium-albuterol 0.5-2.5 (3) MG/3ML Soln Commonly known as: DUONEB Take 3 mLs by nebulization every 6 (six) hours.   lactulose 10 GM/15ML solution Commonly known as: CHRONULAC  Take 45 mLs (30 g total) by mouth daily as needed for severe constipation.   loratadine 10 MG tablet Commonly known as: CLARITIN Take 10 mg by mouth daily.   magic mouthwash (nystatin, lidocaine, diphenhydrAMINE, alum & mag hydroxide) suspension Swish and swallow 5 mLs 4 (four) times daily as needed for mouth pain.   magnesium chloride 64 MG Tbec SR tablet Commonly known as: SLOW-MAG Take 1 tablet (64 mg total) by mouth daily.   Multi-Vitamins Tabs Take 1 tablet by mouth daily. once daily.   pantoprazole 40 MG tablet Commonly known as: PROTONIX Take 40 mg by mouth 2 (two) times daily.   potassium chloride 10 MEQ tablet Commonly known as: KLOR-CON M Take 1 tablet (10 mEq total) by mouth daily.   predniSONE 10 MG  tablet Commonly known as: DELTASONE 4 tabs po day1; 3 tabs po day2; 2 tabs po day3,4; 1 tab po day5,6, 1/2 tab po day7,8   sucralfate 1 g tablet Commonly known as: CARAFATE Take 1 g by mouth 4 (four) times daily.   traZODone 150 MG tablet Commonly known as: DESYREL Take 300 mg by mouth at bedtime.   venlafaxine XR 150 MG 24 hr capsule Commonly known as: EFFEXOR-XR Take 150 mg by mouth daily with breakfast.   Vitamin D3 1000 units Caps Take 2,000 Units by mouth daily.        Follow-up Information     [facility] Follow up in 1 day(s).                 Discharge Exam: Filed Weights   02/12/22 1052  Weight: 30.5 kg   Physical Exam HENT:     Head: Normocephalic.     Mouth/Throat:     Pharynx: No oropharyngeal exudate.  Eyes:     General: Lids are normal.     Conjunctiva/sclera: Conjunctivae normal.  Cardiovascular:     Rate and Rhythm: Normal rate and regular rhythm.     Heart sounds: Normal heart sounds, S1 normal and S2 normal.  Pulmonary:     Breath sounds: Examination of the right-lower field reveals decreased breath sounds. Examination of the left-lower field reveals decreased breath sounds. Decreased breath sounds present. No wheezing, rhonchi or rales.  Abdominal:     Palpations: Abdomen is soft.     Tenderness: There is no abdominal tenderness.  Musculoskeletal:     Right lower leg: No swelling.     Left lower leg: No swelling.  Skin:    General: Skin is warm.     Findings: No rash.  Neurological:     Mental Status: She is alert and oriented to person, place, and time.      Condition at discharge: fair  The results of significant diagnostics from this hospitalization (including imaging, microbiology, ancillary and laboratory) are listed below for reference.   Imaging Studies: DG Chest Port 1 View  Result Date: 02/17/2022 CLINICAL DATA:  Evaluate pneumonia. Emphysema, stroke and non-small cell lung cancer. Status post right lower  lobectomy. EXAM: PORTABLE CHEST 1 VIEW COMPARISON:  02/12/22. FINDINGS: There is a right chest wall port a catheter with tip in the projection of the cavoatrial junction. Stable cardiomediastinal contours. Post treatment changes within the right lung compatible with known treated lung cancer. There is associated volume loss and tenting of the right hemidiaphragm. Since the previous exam there is been interval increase in diffuse coarsened interstitial markings throughout both lungs with an upper lung zone predominance. Capsular calcifications of bilateral breast implants  again noted. IMPRESSION: Interval increase in diffuse coarsened interstitial markings throughout both lungs with an upper lung zone predominance. Imaging findings are concerning for either pulmonary edema or worsening multifocal infection. Electronically Signed   By: Kerby Moors M.D.   On: 02/17/2022 07:31   CT Angio Chest PE W and/or Wo Contrast  Result Date: 02/12/2022 CLINICAL DATA:  77 year old with pulmonary embolism suspected, high probability. Fever and low oxygen level. History of lung cancer. EXAM: CT ANGIOGRAPHY CHEST WITH CONTRAST TECHNIQUE: Multidetector CT imaging of the chest was performed using the standard protocol during bolus administration of intravenous contrast. Multiplanar CT image reconstructions and MIPs were obtained to evaluate the vascular anatomy. RADIATION DOSE REDUCTION: This exam was performed according to the departmental dose-optimization program which includes automated exposure control, adjustment of the mA and/or kV according to patient size and/or use of iterative reconstruction technique. CONTRAST:  21mL OMNIPAQUE IOHEXOL 350 MG/ML SOLN COMPARISON:  CT chest, abdomen and pelvis 01/24/2022 FINDINGS: Cardiovascular: Negative for pulmonary embolism. Atherosclerotic calcifications involving the thoracic aorta without aneurysm or dissection. Great vessels are patent. Bilateral proximal vertebral arteries are  patent. Right jugular Port-A-Cath tip near the superior cavoatrial junction. Heart size is normal. Mediastinum/Nodes: Thyroid tissue is unremarkable. Concern for increased right subcarinal tissue on image 67/4 measuring up to 1.0 cm in the short axis and this tissue roughly measures 0.5 cm on the previous chest CT. No other significant mediastinal or hilar lymph node enlargement. No axillary lymph node enlargement. Lungs/Pleura: Severe centrilobular emphysema. Postoperative changes compatible with a right upper lobectomy. The focal parenchymal lesion or area of consolidation in the right lower lobe on image 55/5 measures approximately 2.5 x 2.5 cm and minimally changed compared to the recent comparison chest CT. However, there is extensive new septal thickening and parenchymal densities throughout the right lower lobe concerning for infectious or inflammatory process. Small amount of atelectasis along the posterior left lower lobe. Mild septal thickening in left lung. No large pleural effusions. Upper Abdomen: Bilateral renal cysts that do not require dedicated follow-up. No acute abnormality in the visualized upper abdomen. Musculoskeletal: Old right posterior seventh rib fracture with some callus formation. Old T7 vertebral body compression fracture. No acute bone abnormality. Bilateral breast implants. Review of the MIP images confirms the above findings. IMPRESSION: 1. Negative for pulmonary embolism. 2. Extensive new septal thickening and parenchymal densities throughout the right lower lobe. Mild septal thickening scattered throughout the left lung. Findings are concerning for infection / inflammation in the right lower lobe but there may also be asymmetric pulmonary edema or multifocal infection in the left lung. 3. Focal consolidated area in the right lower lobe has minimally changed. 4. Concern for enlarged right subcarinal nodal tissue. This could be reactive but recommend close surveillance. 5. Aortic  Atherosclerosis (ICD10-I70.0) and Emphysema (ICD10-J43.9). Electronically Signed   By: Markus Daft M.D.   On: 02/12/2022 13:25   DG Chest Port 1 View  Result Date: 02/12/2022 CLINICAL DATA:  Shortness of breath. EXAM: PORTABLE CHEST 1 VIEW COMPARISON:  Chest x-ray 10/30/2021 FINDINGS: The cardiac silhouette, mediastinal and hilar contours are within normal limits and stable. Underlying emphysematous changes and pulmonary scarring. Right hilar and infrahilar density appears stable and is consistent with known treated right lung cancer. No obvious superimposed infiltrate or effusion. IMPRESSION: 1. Chronic right hilar and infrahilar density consistent with known treated right lung cancer. 2. Underlying emphysematous changes and pulmonary scarring. Electronically Signed   By: Marijo Sanes M.D.   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 RADIOPHARMACEUTICALS:  20.01 mCi Technetium-73m MDP IV COMPARISON:  None Correlation: CT chest abdomen pelvis 01/24/2022 FINDINGS: Uptake RIGHT paraspinal at high RIGHT thoracic region at T1 or T2 level; this corresponds to a lytic focus with a fracture in the RIGHT transverse process of T2 on accompanying CT. Uptake at RIGHT lateral cervical spine, shoulders, sternoclavicular joints, wrists, and LEFT hip typically degenerative. RIGHT hip prosthesis. Increased uptake in midthoracic spine at T7 corresponding to compression fracture on CT. No additional worrisome sites of tracer accumulation are identified. Oblique uptake of tracer in LEFT upper quadrant consistent with gastric localization of free pertechnetate. Otherwise expected urinary tract and soft tissue distribution of tracer.  IMPRESSION: Focal increased tracer uptake RIGHT transverse process T2 corresponding to a lytic focus with a fracture on CT, consistent with pathologic fracture through a metastasis; the lytic lesion with increased FDG accumulation with seen on most recent PET-CT Uptake at compression fracture T7 vertebral body new since 09/13/2021, could represent an insufficiency fracture or pathologic fracture; no FDG localization at this site on most recent PET-CT. Free pertechnetate localization within stomach. Electronically Signed   By: Ulyses Southward M.D.   On: 01/25/2022 15:23   CT CHEST ABDOMEN PELVIS W CONTRAST  Result Date: 01/25/2022 CLINICAL DATA:  Lung cancer. Remote right upper lobe lung cancer. 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 * Tracking Code: BO * EXAM: CT CHEST, ABDOMEN, AND PELVIS WITH CONTRAST TECHNIQUE: Multidetector CT imaging of the chest, abdomen and pelvis was performed following the standard protocol during bolus administration of intravenous contrast. RADIATION DOSE REDUCTION: This exam was performed according to the departmental dose-optimization program which includes automated exposure control, adjustment of the mA and/or kV according to patient size and/or use of iterative reconstruction technique. CONTRAST:  65mL OMNIPAQUE IOHEXOL 300 MG/ML  SOLN COMPARISON:  11/08/2021 PET and CT of the chest of 09/13/2021. FINDINGS: CT CHEST FINDINGS Cardiovascular: Right Port-A-Cath tip superior caval/atrial junction. Aortic atherosclerosis. Normal heart size, without pericardial effusion. Right coronary artery calcification. No central pulmonary embolism, on this non-dedicated study. Mediastinum/Nodes: No supraclavicular adenopathy. The hypermetabolic high right paratracheal node is decreased in size. 4 mm on 22/6 today versus 9 mm on the prior diagnostic CT. Subcarinal node measures 4 mm on 26/6 versus 8 mm on the prior diagnostic CT. This was also hypermetabolic on  the interval PET. No residual soft tissue thickening about the bronchus intermedius/right hilum to correspond to hypermetabolism on prior PET. No left hilar adenopathy. Lungs/Pleura: No pleural fluid. Advanced centrilobular emphysema. Right upper lobectomy. The treated right lower lobe lung lesion measures 2.8 x 2.6 cm on 59/8 versus 3.5 x 2.4 cm on 09/13/2021 when measured in a similar fashion. The left lower lobe subsolid nodule is less apparent today, with only minimal interstitial thickening remaining on 92/8. Musculoskeletal: Bilateral calcified breast implants with suspicion of extracapsular leakage on the right. Osteopenia. No CT correlate for the right T2 transverse process hypermetabolism on prior PET. Right 7th posterolateral rib minimally displaced fracture on 23/6 is unchanged and may relate to radiation induced insufficiency. Moderate T7 compression deformity with minimal ventral canal encroachment is new on sagittal image 65. CT ABDOMEN PELVIS FINDINGS Hepatobiliary: No suspicious liver  lesion. Tiny segment 2-3 hepatic cyst. Cholecystectomy, without biliary ductal dilatation. Pancreas: Normal, without mass or ductal dilatation. Spleen: Normal in size, without focal abnormality. Adrenals/Urinary Tract: Normal adrenal glands. Bilateral, left larger than right fluid density renal lesions of up to 3.7 cm are most consistent with cysts . In the absence of clinically indicated signs/symptoms require(s) no independent follow-up. No hydronephrosis. Degraded evaluation of the pelvis, secondary to beam hardening artifact from right hip arthroplasty. Grossly normal urinary bladder. Stomach/Bowel: Surgical changes about the gastroesophageal junction and gastric body. Rectosigmoid sutures. Colonic stool burden suggests constipation. Normal small bowel. Vascular/Lymphatic: Aortic atherosclerosis. No abdominopelvic adenopathy. Reproductive: Poorly evaluated secondary to beam hardening artifact. No gross adnexal mass.  Other: No significant free fluid. No evidence of omental or peritoneal disease. No free intraperitoneal air. Musculoskeletal: Right hip arthroplasty. IMPRESSION: 1. Response to therapy of mediastinal nodal metastasis and right hilar lcal recurrence centered about the bronchus intermedius. 2. Decreased size of treated right lower lobe primary. 3. No new or progressive disease. 4. No acute process or evidence of metastatic disease in the abdomen or pelvis. 5.  Possible constipation. 6. Interval T7 compression deformity, favored to be due to osteopenia. No correlate hypermetabolism on prior PET. 7. Aortic atherosclerosis (ICD10-I70.0), coronary artery atherosclerosis and emphysema (ICD10-J43.9). 8. Degraded evaluation of the pelvis, secondary to beam hardening artifact from right hip arthroplasty. Electronically Signed   By: Jeronimo Greaves M.D.   On: 01/25/2022 08:48    Microbiology: Results for orders placed or performed during the hospital encounter of 02/12/22  Blood Culture (routine x 2)     Status: None   Collection Time: 02/12/22 11:08 AM   Specimen: BLOOD  Result Value Ref Range Status   Specimen Description BLOOD  LEFT ARM  Final   Special Requests   Final    BOTTLES DRAWN AEROBIC AND ANAEROBIC Blood Culture adequate volume   Culture   Final    NO GROWTH 5 DAYS Performed at [facility], [address] [place], [place] [address]    Report Status 02/17/2022 FINAL  Final  Resp Panel by RT-PCR (Flu A&B, Covid) Anterior Nasal Swab     Status: None   Collection Time: 02/12/22 11:08 AM   Specimen: Anterior Nasal Swab  Result Value Ref Range Status   SARS Coronavirus 2 by RT PCR NEGATIVE NEGATIVE Final    Comment: (NOTE) SARS-CoV-2 target nucleic acids are NOT DETECTED.  The SARS-CoV-2 RNA is generally detectable in upper respiratory specimens during the acute phase of infection. The lowest concentration of SARS-CoV-2 viral copies this assay can detect is 138 copies/mL. A negative  result does not preclude SARS-Cov-2 infection and should not be used as the sole basis for treatment or other patient management decisions. A negative result may occur with  improper specimen collection/handling, submission of specimen other than nasopharyngeal swab, presence of viral mutation(s) within the areas targeted by this assay, and inadequate number of viral copies(<138 copies/mL). A negative result must be combined with clinical observations, patient history, and epidemiological information. The expected result is Negative.  Fact Sheet for Patients:  BloggerCourse.com  Fact Sheet for Healthcare Providers:  SeriousBroker.it  This test is no t yet approved or cleared by the [place] FDA and  has been authorized for detection and/or diagnosis of SARS-CoV-2 by FDA under an Emergency Use Authorization (EUA). This EUA will remain  in effect (meaning this test can be used) for the duration of the COVID-19 declaration under Section 564(b)(1) of the Act,  21 U.S.C.section 360bbb-3(b)(1), unless the authorization is terminated  or revoked sooner.       Influenza A by PCR NEGATIVE NEGATIVE Final   Influenza B by PCR NEGATIVE NEGATIVE Final    Comment: (NOTE) The Xpert Xpress SARS-CoV-2/FLU/RSV plus assay is intended as an aid in the diagnosis of influenza from Nasopharyngeal swab specimens and should not be used as a sole basis for treatment. Nasal washings and aspirates are unacceptable for Xpert Xpress SARS-CoV-2/FLU/RSV testing.  Fact Sheet for Patients: EntrepreneurPulse.com.au  Fact Sheet for Healthcare Providers: IncredibleEmployment.be  This test is not yet approved or cleared by the [place] FDA and has been authorized for detection and/or diagnosis of SARS-CoV-2 by FDA under an Emergency Use Authorization (EUA). This EUA will remain in effect (meaning this test can be used)  for the duration of the COVID-19 declaration under Section 564(b)(1) of the Act, 21 U.S.C. section 360bbb-3(b)(1), unless the authorization is terminated or revoked.  Performed at [facility], [address] [place], [place] [address]   Urine Culture     Status: Abnormal   Collection Time: 02/12/22 11:32 AM   Specimen: In/Out Cath Urine  Result Value Ref Range Status   Specimen Description   Final    IN/OUT CATH URINE Performed at [facility], [address] [place], [place] [address]    Special Requests   Final    NONE Performed at [facility], [address] [place], [place] [address]    Culture >=100,000 COLONIES/mL ESCHERICHIA COLI (A)  Final   Report Status 02/14/2022 FINAL  Final   Organism ID, Bacteria ESCHERICHIA COLI (A)  Final      Susceptibility   Escherichia coli - MIC*    AMPICILLIN <=2 SENSITIVE Sensitive     CEFAZOLIN <=4 SENSITIVE Sensitive     CEFEPIME <=0.12 SENSITIVE Sensitive     CEFTRIAXONE <=0.25 SENSITIVE Sensitive     CIPROFLOXACIN <=0.25 SENSITIVE Sensitive     GENTAMICIN <=1 SENSITIVE Sensitive     IMIPENEM <=0.25 SENSITIVE Sensitive     NITROFURANTOIN <=16 SENSITIVE Sensitive     TRIMETH/SULFA <=20 SENSITIVE Sensitive     AMPICILLIN/SULBACTAM <=2 SENSITIVE Sensitive     PIP/TAZO <=4 SENSITIVE Sensitive     * >=100,000 COLONIES/mL ESCHERICHIA COLI  Blood Culture (routine x 2)     Status: None   Collection Time: 02/12/22 11:33 AM   Specimen: BLOOD  Result Value Ref Range Status   Specimen Description BLOOD PORT  Final   Special Requests   Final    BOTTLES DRAWN AEROBIC AND ANAEROBIC Blood Culture adequate volume   Culture   Final    NO GROWTH 5 DAYS Performed at [facility], [address], [place], [place] [address]    Report Status 02/17/2022 FINAL  Final  MRSA Next Gen by PCR, Nasal     Status: None   Collection Time: 02/12/22  6:56 PM   Specimen: Nasal Mucosa; Nasal Swab  Result Value Ref  Range Status   MRSA by PCR Next Gen NOT DETECTED NOT DETECTED Final    Comment: (NOTE) The GeneXpert MRSA Assay (FDA approved for NASAL specimens only), is one component of a comprehensive MRSA colonization surveillance program. It is not intended to diagnose MRSA infection nor to guide or monitor treatment for MRSA infections. Test performance is not FDA approved in patients less than 33 years old. Performed at [facility] [address], [place], [place] [address]   Expectorated Sputum Assessment  w Gram Stain, Rflx to Resp Cult     Status: None   Collection Time: 02/14/22  8:25 AM   Specimen: Sputum  Result Value Ref Range Status   Specimen Description SPUTUM  Final   Special Requests Immunocompromised  Final   Sputum evaluation   Final    THIS SPECIMEN IS ACCEPTABLE FOR SPUTUM CULTURE Performed at [facility], [address], [place], [place] [address]    Report Status 02/16/2022 FINAL  Final  Culture, Respiratory w Gram Stain     Status: None   Collection Time: 02/14/22  8:25 AM   Specimen: SPU  Result Value Ref Range Status   Specimen Description   Final    SPUTUM Performed at [facility], [address] [place], [place] [address]    Special Requests   Final    Immunocompromised Reflexed from [phone] Performed at [facility], [address] [place], [place] [address]    Gram Stain   Final    ABUNDANT SQUAMOUS EPITHELIAL CELLS PRESENT ABUNDANT WBC PRESENT, PREDOMINANTLY PMN FEW YEAST WITH PSEUDOHYPHAE FEW GRAM POSITIVE COCCI IN PAIRS Performed at [facility], [address], [place], [place] [address]    Culture MODERATE CANDIDA ALBICANS  Final   Report Status 02/19/2022 FINAL  Final    Labs: CBC: Recent Labs  Lab 02/14/22 0515 02/15/22 0600 02/18/22 0623 02/19/22 0607  WBC 7.0  --  5.2 9.3  HGB 9.4* 9.5* 8.6* 8.7*  HCT 29.9*  --  27.3* 28.0*  MCV 98.4  --  98.9 98.9  PLT 195  --  204 217   Basic  Metabolic Panel: Recent Labs  Lab 02/14/22 0515 02/15/22 0600 02/16/22 0825 02/17/22 0445 02/18/22 0623 02/19/22 0607  NA 136  --  137  --  136 138  K 3.8 3.2* 3.6 4.5 4.0 4.4  CL 101  --  101  --  97* 99  CO2 28  --  29  --  31 32  GLUCOSE 93  --  113*  --  186* 107*  BUN 12  --  14  --  36* 34*  CREATININE 0.39*  --  0.48  --  0.58 0.49  CALCIUM 8.0*  --  7.8*  --  7.9* 8.3*  MG 1.6* 1.5* 1.5* 2.0 1.6* 1.8  PHOS  --   --   --  1.1* 3.6  --    Liver Function Tests: Recent Labs  Lab 02/18/22 0623  AST 31  ALT 24  ALKPHOS 62  BILITOT 0.5  PROT 5.5*  ALBUMIN 2.1*     Discharge time spent: greater than 30 minutes.  Signed: Alford Highland, MD [facility] 02/20/2022

## 2022-02-21 ENCOUNTER — Encounter: Payer: Self-pay | Admitting: Oncology

## 2022-02-21 ENCOUNTER — Ambulatory Visit: Payer: Medicare Other

## 2022-02-21 ENCOUNTER — Inpatient Hospital Stay: Payer: Medicare Other

## 2022-02-26 ENCOUNTER — Encounter: Payer: Self-pay | Admitting: Oncology

## 2022-02-26 ENCOUNTER — Ambulatory Visit: Payer: Medicare Other

## 2022-02-26 ENCOUNTER — Inpatient Hospital Stay: Payer: Medicare Other

## 2022-02-28 ENCOUNTER — Ambulatory Visit: Payer: Medicare Other

## 2022-02-28 ENCOUNTER — Inpatient Hospital Stay: Payer: Medicare Other

## 2022-02-28 ENCOUNTER — Ambulatory Visit: Payer: Medicare Other | Admitting: Radiation Oncology

## 2022-03-05 ENCOUNTER — Inpatient Hospital Stay: Payer: Medicare Other

## 2022-03-05 ENCOUNTER — Ambulatory Visit: Payer: Medicare Other

## 2022-03-07 ENCOUNTER — Ambulatory Visit: Payer: Medicare Other | Admitting: Oncology

## 2022-03-07 ENCOUNTER — Ambulatory Visit: Payer: Medicare Other

## 2022-03-07 ENCOUNTER — Other Ambulatory Visit: Payer: Self-pay | Admitting: Oncology

## 2022-03-07 ENCOUNTER — Other Ambulatory Visit: Payer: Medicare Other

## 2022-03-07 DIAGNOSIS — C342 Malignant neoplasm of middle lobe, bronchus or lung: Secondary | ICD-10-CM

## 2022-03-08 ENCOUNTER — Other Ambulatory Visit: Payer: Self-pay

## 2022-03-11 ENCOUNTER — Other Ambulatory Visit: Payer: Self-pay | Admitting: *Deleted

## 2022-03-11 DIAGNOSIS — C342 Malignant neoplasm of middle lobe, bronchus or lung: Secondary | ICD-10-CM

## 2022-03-12 ENCOUNTER — Inpatient Hospital Stay: Payer: Medicare Other

## 2022-03-12 ENCOUNTER — Inpatient Hospital Stay (HOSPITAL_BASED_OUTPATIENT_CLINIC_OR_DEPARTMENT_OTHER): Payer: Medicare Other | Admitting: Oncology

## 2022-03-12 ENCOUNTER — Ambulatory Visit: Payer: Medicare Other

## 2022-03-12 ENCOUNTER — Inpatient Hospital Stay: Payer: Medicare Other | Attending: Oncology

## 2022-03-12 ENCOUNTER — Encounter: Payer: Self-pay | Admitting: Oncology

## 2022-03-12 VITALS — BP 112/67 | HR 115 | Temp 98.6°F | Resp 19 | Wt <= 1120 oz

## 2022-03-12 DIAGNOSIS — D508 Other iron deficiency anemias: Secondary | ICD-10-CM | POA: Diagnosis not present

## 2022-03-12 DIAGNOSIS — C342 Malignant neoplasm of middle lobe, bronchus or lung: Secondary | ICD-10-CM

## 2022-03-12 DIAGNOSIS — Z902 Acquired absence of lung [part of]: Secondary | ICD-10-CM | POA: Insufficient documentation

## 2022-03-12 DIAGNOSIS — Z9884 Bariatric surgery status: Secondary | ICD-10-CM | POA: Diagnosis not present

## 2022-03-12 DIAGNOSIS — Z5112 Encounter for antineoplastic immunotherapy: Secondary | ICD-10-CM

## 2022-03-12 DIAGNOSIS — C771 Secondary and unspecified malignant neoplasm of intrathoracic lymph nodes: Secondary | ICD-10-CM | POA: Diagnosis present

## 2022-03-12 DIAGNOSIS — Z87891 Personal history of nicotine dependence: Secondary | ICD-10-CM | POA: Diagnosis not present

## 2022-03-12 DIAGNOSIS — K912 Postsurgical malabsorption, not elsewhere classified: Secondary | ICD-10-CM | POA: Insufficient documentation

## 2022-03-12 DIAGNOSIS — Z79899 Other long term (current) drug therapy: Secondary | ICD-10-CM | POA: Insufficient documentation

## 2022-03-12 DIAGNOSIS — Z9221 Personal history of antineoplastic chemotherapy: Secondary | ICD-10-CM | POA: Insufficient documentation

## 2022-03-12 DIAGNOSIS — Z923 Personal history of irradiation: Secondary | ICD-10-CM | POA: Insufficient documentation

## 2022-03-12 LAB — CBC WITH DIFFERENTIAL/PLATELET
Abs Immature Granulocytes: 0.05 10*3/uL (ref 0.00–0.07)
Basophils Absolute: 0 10*3/uL (ref 0.0–0.1)
Basophils Relative: 0 %
Eosinophils Absolute: 0 10*3/uL (ref 0.0–0.5)
Eosinophils Relative: 0 %
HCT: 29.7 % — ABNORMAL LOW (ref 36.0–46.0)
Hemoglobin: 9.1 g/dL — ABNORMAL LOW (ref 12.0–15.0)
Immature Granulocytes: 1 %
Lymphocytes Relative: 6 %
Lymphs Abs: 0.4 10*3/uL — ABNORMAL LOW (ref 0.7–4.0)
MCH: 28.2 pg (ref 26.0–34.0)
MCHC: 30.6 g/dL (ref 30.0–36.0)
MCV: 92 fL (ref 80.0–100.0)
Monocytes Absolute: 0.7 10*3/uL (ref 0.1–1.0)
Monocytes Relative: 12 %
Neutro Abs: 4.4 10*3/uL (ref 1.7–7.7)
Neutrophils Relative %: 81 %
Platelets: 293 10*3/uL (ref 150–400)
RBC: 3.23 MIL/uL — ABNORMAL LOW (ref 3.87–5.11)
RDW: 18.5 % — ABNORMAL HIGH (ref 11.5–15.5)
WBC: 5.5 10*3/uL (ref 4.0–10.5)
nRBC: 0 % (ref 0.0–0.2)

## 2022-03-12 LAB — COMPREHENSIVE METABOLIC PANEL
ALT: 24 U/L (ref 0–44)
AST: 22 U/L (ref 15–41)
Albumin: 2.8 g/dL — ABNORMAL LOW (ref 3.5–5.0)
Alkaline Phosphatase: 72 U/L (ref 38–126)
Anion gap: 11 (ref 5–15)
BUN: 30 mg/dL — ABNORMAL HIGH (ref 8–23)
CO2: 27 mmol/L (ref 22–32)
Calcium: 8.1 mg/dL — ABNORMAL LOW (ref 8.9–10.3)
Chloride: 97 mmol/L — ABNORMAL LOW (ref 98–111)
Creatinine, Ser: 0.4 mg/dL — ABNORMAL LOW (ref 0.44–1.00)
GFR, Estimated: >60 mL/min (ref >60)
Glucose, Bld: 125 mg/dL — ABNORMAL HIGH (ref 70–99)
Potassium: 3.6 mmol/L (ref 3.5–5.1)
Sodium: 135 mmol/L (ref 135–145)
Total Bilirubin: 0.3 mg/dL (ref 0.3–1.2)
Total Protein: 6 g/dL — ABNORMAL LOW (ref 6.5–8.1)

## 2022-03-12 MED ORDER — SODIUM CHLORIDE 0.9 % IV SOLN
Freq: Once | INTRAVENOUS | Status: AC
  Filled 2022-03-12: qty 250

## 2022-03-12 MED ORDER — SODIUM CHLORIDE 0.9% FLUSH
10.0000 mL | INTRAVENOUS | Status: DC | PRN
  Administered 2022-03-12: 10 mL
  Filled 2022-03-12: qty 10

## 2022-03-12 MED ORDER — SODIUM CHLORIDE 0.9 % IV SOLN
1500.0000 mg | Freq: Once | INTRAVENOUS | Status: AC
  Administered 2022-03-12: 1500 mg via INTRAVENOUS
  Filled 2022-03-12: qty 30

## 2022-03-12 MED ORDER — HEPARIN SOD (PORK) LOCK FLUSH 100 UNIT/ML IV SOLN
500.0000 [IU] | Freq: Once | INTRAVENOUS | Status: AC | PRN
  Administered 2022-03-12: 500 [IU]
  Filled 2022-03-12: qty 5

## 2022-03-12 NOTE — Patient Instructions (Signed)
Carol Shaffer  Discharge Instructions: Thank you for choosing [facility] to provide your oncology and hematology care.  If you have a lab appointment with the [facility], please go directly to the [facility] and check in at the registration area.  Wear comfortable clothing and clothing appropriate for easy access to any Portacath or PICC line.   We strive to give you quality time with your provider. You may need to reschedule your appointment if you arrive late (15 or more minutes).  Arriving late affects you and other patients whose appointments are after yours.  Also, if you miss three or more appointments without notifying the office, you may be dismissed from the clinic at the provider's discretion.      For prescription refill requests, have your pharmacy contact our office and allow 72 hours for refills to be completed.    Today you received the following chemotherapy and/or immunotherapy agents imfinzi    To help prevent nausea and vomiting after your treatment, we encourage you to take your nausea medication as directed.  BELOW ARE SYMPTOMS THAT SHOULD BE REPORTED IMMEDIATELY: *FEVER GREATER THAN 100.4 F (38 C) OR HIGHER *CHILLS OR SWEATING *NAUSEA AND VOMITING THAT IS NOT CONTROLLED WITH YOUR NAUSEA MEDICATION *UNUSUAL SHORTNESS OF BREATH *UNUSUAL BRUISING OR BLEEDING *URINARY PROBLEMS (pain or burning when urinating, or frequent urination) *BOWEL PROBLEMS (unusual diarrhea, constipation, pain near the anus) TENDERNESS IN MOUTH AND THROAT WITH OR WITHOUT PRESENCE OF ULCERS (sore throat, sores in mouth, or a toothache) UNUSUAL RASH, SWELLING OR PAIN  UNUSUAL VAGINAL DISCHARGE OR ITCHING   Items with * indicate a potential emergency and should be followed up as soon as possible or go to the Emergency Department if any problems should occur.  Please show the CHEMOTHERAPY ALERT CARD or IMMUNOTHERAPY ALERT CARD at check-in to the  Emergency Department and triage nurse.  Should you have questions after your visit or need to cancel or reschedule your appointment, please contact [facility]  [phone] and follow the prompts.  Office hours are 8:00 a.m. to 4:30 p.m. Monday - Friday. Please note that voicemails left after 4:00 p.m. may not be returned until the following business day.  We are closed weekends and major holidays. You have access to a nurse at all times for urgent questions. Please call the main number to the clinic [phone] and follow the prompts.  For any non-urgent questions, you may also contact your provider using MyChart. We now offer e-Visits for anyone 37 and older to request care online for non-urgent symptoms. For details visit [email or web address].   Also download the MyChart app! Go to the app store, search "MyChart", open the app, select [facility], and log in with your MyChart username and password.  Durvalumab Injection What is this medication? DURVALUMAB (dur VAL ue mab) treats some types of cancer. It works by helping your immune system slow or stop the spread of cancer cells. It is a monoclonal antibody. This medicine may be used for other purposes; ask your health care provider or pharmacist if you have questions. COMMON BRAND NAME(S): IMFINZI What should I tell my care team before I take this medication? They need to know if you have any of these conditions: Allogeneic stem cell transplant (uses someone else's stem cells) Autoimmune diseases, such as Crohn disease, ulcerative colitis, lupus History of chest radiation Nervous system problems, such as Guillain-Barre syndrome, myasthenia gravis Organ transplant An  unusual or allergic reaction to durvalumab, other medications, foods, dyes, or preservatives Pregnant or trying to get pregnant Breast-feeding How should I use this medication? This medication is infused into a vein. It is given by your  care team in a hospital or clinic setting. A special MedGuide will be given to you before each treatment. Be sure to read this information carefully each time. Talk to your care team about the use of this medication in children. Special care may be needed. Overdosage: If you think you have taken too much of this medicine contact a poison control center or emergency room at once. NOTE: This medicine is only for you. Do not share this medicine with others. What if I miss a dose? Keep appointments for follow-up doses. It is important not to miss your dose. Call your care team if you are unable to keep an appointment. What may interact with this medication? Interactions have not been studied. This list may not describe all possible interactions. Give your health care provider a list of all the medicines, herbs, non-prescription drugs, or dietary supplements you use. Also tell them if you smoke, drink alcohol, or use illegal drugs. Some items may interact with your medicine. What should I watch for while using this medication? Your condition will be monitored carefully while you are receiving this medication. You may need blood work while taking this medication. This medication may cause serious skin reactions. They can happen weeks to months after starting the medication. Contact your care team right away if you notice fevers or flu-like symptoms with a rash. The rash may be red or purple and then turn into blisters or peeling of the skin. You may also notice a red rash with swelling of the face, lips, or lymph nodes in your neck or under your arms. Tell your care team right away if you have any change in your eyesight. Talk to your care team if you may be pregnant. Serious birth defects can occur if you take this medication during pregnancy and for 3 months after the last dose. You will need a negative pregnancy test before starting this medication. Contraception is recommended while taking this  medication and for 3 months after the last dose. Your care team can help you find the option that works for you. Do not breastfeed while taking this medication and for 3 months after the last dose. What side effects may I notice from receiving this medication? Side effects that you should report to your care team as soon as possible: Allergic reactions--skin rash, itching, hives, swelling of the face, lips, tongue, or throat Dry cough, shortness of breath or trouble breathing Eye pain, redness, irritation, or discharge with blurry or decreased vision Heart muscle inflammation--unusual weakness or fatigue, shortness of breath, chest pain, fast or irregular heartbeat, dizziness, swelling of the ankles, feet, or hands Hormone gland problems--headache, sensitivity to light, unusual weakness or fatigue, dizziness, fast or irregular heartbeat, increased sensitivity to cold or heat, excessive sweating, constipation, hair loss, increased thirst or amount of urine, tremors or shaking, irritability Infusion reactions--chest pain, shortness of breath or trouble breathing, feeling faint or lightheaded Kidney injury (glomerulonephritis)--decrease in the amount of urine, red or dark brown urine, foamy or bubbly urine, swelling of the ankles, hands, or feet Liver injury--right upper belly pain, loss of appetite, nausea, light-colored stool, dark yellow or brown urine, yellowing skin or eyes, unusual weakness or fatigue Pain, tingling, or numbness in the hands or feet, muscle weakness, change in vision,  confusion or trouble speaking, loss of balance or coordination, trouble walking, seizures Rash, fever, and swollen lymph nodes Redness, blistering, peeling, or loosening of the skin, including inside the mouth Sudden or severe stomach pain, bloody diarrhea, fever, nausea, vomiting Side effects that usually do not require medical attention (report these to your care team if they continue or are bothersome): Bone,  joint, or muscle pain Diarrhea Fatigue Loss of appetite Nausea Skin rash This list may not describe all possible side effects. Call your doctor for medical advice about side effects. You may report side effects to FDA at 1-800-FDA-1088. Where should I keep my medication? This medication is given in a hospital or clinic. It will not be stored at home. NOTE: This sheet is a summary. It may not cover all possible information. If you have questions about this medicine, talk to your doctor, pharmacist, or health care provider.  2023 Elsevier/Gold Standard (2021-05-08 00:00:00)

## 2022-03-12 NOTE — Progress Notes (Signed)
Hematology/Oncology Consult note [facility]  Telephone:([phone] [phone]  Patient Care Team: Albina Billet, MD as PCP - General (Internal Medicine) Marden Noble, MD (Internal Medicine) Bary Castilla, Forest Gleason, MD (General Surgery) Telford Nab, RN as Oncology Nurse Navigator Noreene Filbert, MD as Referring Physician (Radiation Oncology) Sindy Guadeloupe, MD as Consulting Physician (Oncology)   Name of the patient: Carol Shaffer  [id number]  [birth year]   Date of visit: 03/12/22  Diagnosis- non-small cell lung cancer involving the right middle lobe stage IV T1b N2 M1       Chief complaint/ Reason for visit-on treatment assessment prior to cycle 1 of maintenance durvalumab  Heme/Onc history:  Patient is a 77 year old female with a history oflung cancer in 2012 s/p right upper lobe resection.  Low dose chest CT on 09/04/2018 revealed multiple small pulmonary nodules are noted in the lungs bilaterally. In addition, there was a 1.53 cm concerning nodule in the right lower lobe. There was diffuse bronchial wall thickening with mild centrilobular and paraseptal emphysema; imaging findings suggestive of underlying COPD.PET scan on 09/16/2018 revealed low level FDG uptake associated with the right lower lobe lung nodule (SUV 1.57). This was a nonspecific finding. Given the size of this nodule and the low level uptake, findings may reflect inflammatory or infectious nodule. Malignancy was not entirely excluded.  Low dose chest CT on 12/04/2018 revealed a 15.3 mm RLL nodule unchanged in size.   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  There were some ground-glass attenuation components, but is predominantly solid in appearance with some internal air bronchograms, with macrolobulated and spiculated borders, clearly  increased in size.   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  Tumor was positive for CK7 and TTF-1 and negative for p40.  She is not a surgical candidate.   She received 6000 cGy SBRT to the RLL from 08/05/2019 - 08/17/2019.   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  History of iron deficiency anemia and she has undergone work-up in the past.  She has a history of gastric bypass surgery.    Patient hadCT scan of breast 2023 which showed narrowing and occlusion of the right middle lobe bronchus which was looking more prominent as compared to prior scans.  There was also concern for paratracheal adenopathy which had increased in size as compared to prior scans.  Patient was seen by pulmonary and underwent bronchoscopy guided biopsy.  Bronchial biopsies of the right middle lobe did not show any malignancy however cytology from station 7 and station 4R lymph node was consistent with adenocarcinoma.  PET scan also showed hypermetabolism in the T2 vertebral body lesion concerning for metastatic disease   Patient completed concurrent chemoradiation with a curative intent.  Repeat scan showed partial response and plan is to proceed with maintenance durvalumab.  There was not adequate sample to perform NGS testing.  PD-L1 less than 1%.    Interval history- Patient was admitted to the hospital in January 2024 for healthcare associated pneumonia requiring prolonged hospitalization and rehab stay.  She is currently on 2 L of oxygen.  She lives alone and needs help with her IADLs.  She has not had any recent falls.  She is using as needed Norco for her chest wall pain about 3-4  times a day  ECOG PS- 2 Pain scale- 3 Opioid associated constipation- no  Review of systems- Review of Systems  Constitutional:  Positive for malaise/fatigue.  Respiratory:         Chest wall pain  Musculoskeletal:  Positive for back  pain.      Allergies  Allergen Reactions   Hydromorphone Hcl Itching   Bentyl [Dicyclomine] Nausea And Vomiting   Biaxin [Clarithromycin] Other (See Comments)    hallucinations   Budesonide Other (See Comments)    Ulcer   Erythromycin Nausea And Vomiting   Reglan [Metoclopramide] Other (See Comments)    tremors     Past Medical History:  Diagnosis Date   Arthritis    hands   Avascular necrosis of hip, right ([place])    Blepharospasm    Cancer ([place]) 2011   Right Upper Lobe Lobectomy   Chronic diarrhea    Chronic diarrhea    Collagenous colitis    Complication of anesthesia    usually wakes up during procedures  (endoscopy and colonoscopy)   COPD (chronic obstructive pulmonary disease) ([place])    COVID 2022   Depression    Diverticulosis    Gastric outlet obstruction    Headache    every couple of days   Hemorrhoid    History of Crohn's disease    Hypoglycemic disorder    IDA (iron deficiency anemia)    Intestinal adhesions    Multiple gastric ulcers    Multiple gastric ulcers    Nausea and vomiting 07/03/2016   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[place])    Stroke ([place]) 8 yrs ago   double vision left eye     Past Surgical History:  Procedure Laterality Date   APPENDECTOMY  1989   botox injections for blepharospasm     BREAST SURGERY     CATARACT EXTRACTION     COLON RESECTION  2005   COLONOSCOPY  10-29-2008   [name]   COLONOSCOPY WITH PROPOFOL N/A 03/05/2017   Procedure: COLONOSCOPY WITH PROPOFOL;  Surgeon: Toledo, Benay Pike, MD;  Location: [facility] ENDOSCOPY;  Service: Gastroenterology;  Laterality: N/A;   COLOSTOMY REVERSAL  2006   ESOPHAGOGASTRODUODENOSCOPY (EGD) WITH PROPOFOL N/A 07/18/2016   Procedure: ESOPHAGOGASTRODUODENOSCOPY (EGD) WITH PROPOFOL;  Surgeon: Robert Bellow, MD;  Location: [facility] ENDOSCOPY;  Service: Endoscopy;  Laterality: N/A;   ESOPHAGOGASTRODUODENOSCOPY (EGD) WITH PROPOFOL N/A 08/03/2016   Procedure: ESOPHAGOGASTRODUODENOSCOPY (EGD)  WITH PROPOFOL;  Surgeon: Robert Bellow, MD;  Location: [facility] ENDOSCOPY;  Service: Endoscopy;  Laterality: N/A;   ESOPHAGOGASTRODUODENOSCOPY (EGD) WITH PROPOFOL N/A 09/01/2018   Procedure: ESOPHAGOGASTRODUODENOSCOPY (EGD) WITH PROPOFOL;  Surgeon: Toledo, Benay Pike, MD;  Location: [facility] END[facility];  Service: Gastroenterology;  Laterality: N/A;   ESOPHAGOGASTRODUODENOSCOPY (EGD) WITH PROPOFOL N/A 12/01/2018   Procedure: ESOPHAGOGASTRODUODENOSCOPY (EGD) WITH PROPOFOL;  Surgeon: Toledo, Benay Pike, MD;  Location: [facility] END[facility];  Service: Gastroenterology;  Laterality: N/A;   ESOPHAGOGASTRODUODENOSCOPY (EGD) WITH PROPOFOL N/A 02/23/2019   Procedure: ESOPHAGOGASTRODUODENOSCOPY (EGD) WITH PROPOFOL;  Surgeon: Lucilla Lame, MD;  Location: [facility];  Service: Endoscopy;  Laterality: N/A;   ESOPHAGOSCOPY WITH DILITATION  2012,2015   [facility] [place]   EYE SURGERY     FLEXIBLE BRONCHOSCOPY N/A 10/30/2021   Procedure: FLEXIBLE BRONCHOSCOPY;  Surgeon: Armando Reichert, MD;  Location: [facility];  Service: Pulmonary;  Laterality: N/A;   GASTRECTOMY     gastric ulcer  1989, 1991   IR IMAGING GUIDED PORT INSERTION  11/14/2021   JOINT REPLACEMENT     right total  hip arthroplasty 03/03/02   LAPAROSCOPIC LYSIS OF ADHESIONS     LUNG CANCER SURGERY  2011   LYSIS OF ADHESION     PLACEMENT OF BREAST IMPLANTS  1973   thoracotomy with right upper lobectomy and central compartment node dissection     UPPER GASTROINTESTINAL ENDOSCOPY  10-29-2008   [name]   VIDEO BRONCHOSCOPY WITH ENDOBRONCHIAL ULTRASOUND N/A 10/30/2021   Procedure: VIDEO BRONCHOSCOPY WITH ENDOBRONCHIAL ULTRASOUND;  Surgeon: Armando Reichert, MD;  Location: [facility];  Service: Pulmonary;  Laterality: N/A;    Social History   Socioeconomic History   Marital status: Widowed    Spouse name: Not on file   Number of children: 2   Years of education: Not on file   Highest education level: Not on file  Occupational History   Occupation: retired     Comment: Pharmacist, hospital  Tobacco Use   Smoking status: Former    Packs/day: 1.00    Years: 25.00    Total pack years: 25.00    Types: Cigarettes    Quit date: 01/30/2008    Years since quitting: 14.1   Smokeless tobacco: Never  Vaping Use   Vaping Use: Never used  Substance and Sexual Activity   Alcohol use: Yes    Comment: occ. for holiday   Drug use: No   Sexual activity: Not Currently  Other Topics Concern   Not on file  Social History Narrative   Lives alone    Social Determinants of Health   Financial Resource Strain: Not on file  Food Insecurity: No Food Insecurity (02/13/2022)   Hunger Vital Sign    Worried About Running Out of Food in the Last Year: Never true    Ran Out of Food in the Last Year: Never true  Transportation Needs: Unmet Transportation Needs (03/12/2022)   PRAPARE - Hydrologist (Medical): Yes    Lack of Transportation (Non-Medical): Yes  Physical Activity: Not on file  Stress: Not on file  Social Connections: Not on file  Intimate Partner Violence: Not At Risk (02/13/2022)   Humiliation, Afraid, Rape, and Kick questionnaire    Fear of Current or Ex-Partner: No    Emotionally Abused: No    Physically Abused: No    Sexually Abused: No    Family History  Problem Relation Age of Onset   Hypertension Mother    Diabetes Father      Current Outpatient Medications:    acetaminophen (TYLENOL) 500 MG tablet, Take 500 mg by mouth every 6 (six) hours as needed., Disp: , Rfl:    Cholecalciferol (VITAMIN D3) 1000 units CAPS, Take 2,000 Units by mouth daily., Disp: , Rfl:    diltiazem (CARDIZEM) 60 MG tablet, Take 1 tablet (60 mg total) by mouth every 6 (six) hours., Disp: 180 tablet, Rfl: 0   docusate (COLACE) 50 MG/5ML liquid, Take by mouth daily., Disp: , Rfl:    fluticasone (FLONASE) 50 MCG/ACT nasal spray, Place 2 sprays into both nostrils daily., Disp: , Rfl:    HYDROcodone-acetaminophen (NORCO/VICODIN) 5-325 MG tablet, Take 1  tablet by mouth every 6 (six) hours as needed for moderate pain., Disp: 15 tablet, Rfl: 0   ipratropium-albuterol (DUONEB) 0.5-2.5 (3) MG/3ML SOLN, Take 3 mLs by nebulization every 6 (six) hours., Disp: 360 mL, Rfl: 0   lactulose (CHRONULAC) 10 GM/15ML solution, Take 45 mLs (30 g total) by mouth daily as needed for severe constipation., Disp: 946 mL, Rfl: 0   loratadine (CLARITIN) 10 MG  tablet, Take 10 mg by mouth daily., Disp: , Rfl:    magnesium chloride (SLOW-MAG) 64 MG TBEC SR tablet, Take 1 tablet (64 mg total) by mouth daily., Disp: 14 tablet, Rfl: 0   Multiple Vitamin (MULTI-VITAMINS) TABS, Take 1 tablet by mouth daily. once daily., Disp: , Rfl:    pantoprazole (PROTONIX) 40 MG tablet, Take 40 mg by mouth 2 (two) times daily., Disp: , Rfl:    potassium chloride (KLOR-CON M) 10 MEQ tablet, Take 1 tablet (10 mEq total) by mouth daily., Disp: 14 tablet, Rfl: 0   sucralfate (CARAFATE) 1 G tablet, Take 1 g by mouth 4 (four) times daily. , Disp: , Rfl:    traZODone (DESYREL) 150 MG tablet, Take 300 mg by mouth at bedtime. , Disp: , Rfl:    venlafaxine XR (EFFEXOR-XR) 150 MG 24 hr capsule, Take 150 mg by mouth daily with breakfast. , Disp: , Rfl:    brimonidine-timolol (COMBIGAN) 0.2-0.5 % ophthalmic solution, INT 1 GTT IN OU BID, Disp: , Rfl:    chlorpheniramine-HYDROcodone (TUSSIONEX) 10-8 MG/5ML, Take 5 mLs by mouth every 12 (twelve) hours as needed for cough., Disp: 70 mL, Rfl: 0   dextromethorphan-guaiFENesin (MUCINEX DM) 30-600 MG 12hr tablet, Take 1 tablet by mouth 2 (two) times daily. (Patient not taking: Reported on 03/12/2022), Disp: 20 tablet, Rfl: 0   erythromycin ophthalmic ointment, Place 1 Application into both eyes at bedtime., Disp: 3.5 g, Rfl: 0   magic mouthwash (nystatin, lidocaine, diphenhydrAMINE, alum & mag hydroxide) suspension, Swish and swallow 5 mLs 4 (four) times daily as needed for mouth pain. (Patient not taking: Reported on 03/12/2022), Disp: 240 mL, Rfl: 1 No current  facility-administered medications for this visit.  Facility-Administered Medications Ordered in Other Visits:    heparin lock flush 100 UNIT/ML injection, , , ,    sodium chloride flush (NS) 0.9 % injection 10 mL, 10 mL, Intracatheter, PRN, Sindy Guadeloupe, MD, 10 mL at 03/12/22 1149  Physical exam:  Vitals:   03/12/22 0913  BP: 112/67  Pulse: (!) 115  Resp: 19  Temp: 98.6 F (37 C)  SpO2: 96%  Weight: 65 lb 12.8 oz (29.8 kg)   Physical Exam Constitutional:      Comments: Sitting in a wheelchair.  Appears in no acute distress.  On 2 L of oxygen  Cardiovascular:     Rate and Rhythm: Normal rate and regular rhythm.     Heart sounds: Normal heart sounds.  Pulmonary:     Effort: Pulmonary effort is normal.     Breath sounds: Normal breath sounds.  Abdominal:     General: Bowel sounds are normal.     Palpations: Abdomen is soft.  Skin:    General: Skin is warm and dry.  Neurological:     Mental Status: She is alert and oriented to person, place, and time.         Latest Ref Rng & Units 03/12/2022    9:02 AM  CMP  Glucose 70 - 99 mg/dL 125   BUN 8 - 23 mg/dL 30   Creatinine 0.44 - 1.00 mg/dL 0.40   Sodium 135 - 145 mmol/L 135   Potassium 3.5 - 5.1 mmol/L 3.6   Chloride 98 - 111 mmol/L 97   CO2 22 - 32 mmol/L 27   Calcium 8.9 - 10.3 mg/dL 8.1   Total Protein 6.5 - 8.1 g/dL 6.0   Total Bilirubin 0.3 - 1.2 mg/dL 0.3   Alkaline Phos 38 - 126  U/L 72   AST 15 - 41 U/L 22   ALT 0 - 44 U/L 24       Latest Ref Rng & Units 03/12/2022    9:02 AM  CBC  WBC 4.0 - 10.5 K/uL 5.5   Hemoglobin 12.0 - 15.0 g/dL 9.1   Hematocrit 36.0 - 46.0 % 29.7   Platelets 150 - 400 K/uL 293     No images are attached to the encounter.  DG Chest Port 1 View  Result Date: 02/17/2022 CLINICAL DATA:  Evaluate pneumonia. Emphysema, stroke and non-small cell lung cancer. Status post right lower lobectomy. EXAM: PORTABLE CHEST 1 VIEW COMPARISON:  02/12/22. FINDINGS: There is a right chest wall  port a catheter with tip in the projection of the cavoatrial junction. Stable cardiomediastinal contours. Post treatment changes within the right lung compatible with known treated lung cancer. There is associated volume loss and tenting of the right hemidiaphragm. Since the previous exam there is been interval increase in diffuse coarsened interstitial markings throughout both lungs with an upper lung zone predominance. Capsular calcifications of bilateral breast implants again noted. IMPRESSION: Interval increase in diffuse coarsened interstitial markings throughout both lungs with an upper lung zone predominance. Imaging findings are concerning for either pulmonary edema or worsening multifocal infection. Electronically Signed   By: Kerby Moors M.D.   On: 02/17/2022 07:31   CT Angio Chest PE W and/or Wo Contrast  Result Date: 02/12/2022 CLINICAL DATA:  77 year old with pulmonary embolism suspected, high probability. Fever and low oxygen level. History of lung cancer. EXAM: CT ANGIOGRAPHY CHEST WITH CONTRAST TECHNIQUE: Multidetector CT imaging of the chest was performed using the standard protocol during bolus administration of intravenous contrast. Multiplanar CT image reconstructions and MIPs were obtained to evaluate the vascular anatomy. RADIATION DOSE REDUCTION: This exam was performed according to the departmental dose-optimization program which includes automated exposure control, adjustment of the mA and/or kV according to patient size and/or use of iterative reconstruction technique. CONTRAST:  8mL OMNIPAQUE IOHEXOL 350 MG/ML SOLN COMPARISON:  CT chest, abdomen and pelvis 01/24/2022 FINDINGS: Cardiovascular: Negative for pulmonary embolism. Atherosclerotic calcifications involving the thoracic aorta without aneurysm or dissection. Great vessels are patent. Bilateral proximal vertebral arteries are patent. Right jugular Port-A-Cath tip near the superior cavoatrial junction. Heart size is normal.  Mediastinum/Nodes: Thyroid tissue is unremarkable. Concern for increased right subcarinal tissue on image 67/4 measuring up to 1.0 cm in the short axis and this tissue roughly measures 0.5 cm on the previous chest CT. No other significant mediastinal or hilar lymph node enlargement. No axillary lymph node enlargement. Lungs/Pleura: Severe centrilobular emphysema. Postoperative changes compatible with a right upper lobectomy. The focal parenchymal lesion or area of consolidation in the right lower lobe on image 55/5 measures approximately 2.5 x 2.5 cm and minimally changed compared to the recent comparison chest CT. However, there is extensive new septal thickening and parenchymal densities throughout the right lower lobe concerning for infectious or inflammatory process. Small amount of atelectasis along the posterior left lower lobe. Mild septal thickening in left lung. No large pleural effusions. Upper Abdomen: Bilateral renal cysts that do not require dedicated follow-up. No acute abnormality in the visualized upper abdomen. Musculoskeletal: Old right posterior seventh rib fracture with some callus formation. Old T7 vertebral body compression fracture. No acute bone abnormality. Bilateral breast implants. Review of the MIP images confirms the above findings. IMPRESSION: 1. Negative for pulmonary embolism. 2. Extensive new septal thickening and parenchymal densities throughout the right lower  lobe. Mild septal thickening scattered throughout the left lung. Findings are concerning for infection / inflammation in the right lower lobe but there may also be asymmetric pulmonary edema or multifocal infection in the left lung. 3. Focal consolidated area in the right lower lobe has minimally changed. 4. Concern for enlarged right subcarinal nodal tissue. This could be reactive but recommend close surveillance. 5. Aortic Atherosclerosis (ICD10-I70.0) and Emphysema (ICD10-J43.9). Electronically Signed   By: Markus Daft M.D.    On: 02/12/2022 13:25   DG Chest Port 1 View  Result Date: 02/12/2022 CLINICAL DATA:  Shortness of breath. EXAM: PORTABLE CHEST 1 VIEW COMPARISON:  Chest x-ray 10/30/2021 FINDINGS: The cardiac silhouette, mediastinal and hilar contours are within normal limits and stable. Underlying emphysematous changes and pulmonary scarring. Right hilar and infrahilar density appears stable and is consistent with known treated right lung cancer. No obvious superimposed infiltrate or effusion. IMPRESSION: 1. Chronic right hilar and infrahilar density consistent with known treated right lung cancer. 2. Underlying emphysematous changes and pulmonary scarring. Electronically Signed   By: Marijo Sanes M.D.   On: 02/12/2022 11:58     Assessment and plan- Patient is a 77 y.o. female with history of non-small cell lung cancer involving the right middle lobe stage IV T1b N2 M1.  She is s/p concurrent chemoradiation with weekly CarboTaxol.  Check scan showed partial response and she is here for on treatment assessment prior to cycle 1 of maintenance durvalumab  Counts okay to proceed with cycle 1 of maintenance durvalumab today and I will see her back in 4 weeks for cycle 2.  She has recovered well from her recent episode of healthcare associated pneumonia.  She is on 2 L of oxygen recently and we will see if we can eventually wean her off oxygen.  I will be doing ambulatory oxygen saturations in the office today.  Lytic lesion noted on T2 vertebral body: I will reach out to radiation oncology to see if she has received palliative radiation for this.  I will continue to monitor this area with repeat scans in the future.  I will consider bisphosphonate discussion at my next visit   Visit Diagnosis 1. Encounter for antineoplastic immunotherapy   2. Non-small cell cancer of middle lobe of right lung ([place])      Dr. Randa Evens, MD, MPH [facility] [phone] 03/12/2022 12:54 PM

## 2022-03-13 ENCOUNTER — Telehealth: Payer: Self-pay | Admitting: *Deleted

## 2022-03-13 DIAGNOSIS — C342 Malignant neoplasm of middle lobe, bronchus or lung: Secondary | ICD-10-CM

## 2022-03-13 NOTE — Telephone Encounter (Signed)
Home health nurse Izora Gala called requesting that we send an order to [employer] for O2 humidifier bottle to be used on patient O2 at home. She is having nasal irritation and bleeding. She is asking this be done ASAP [employer] fax number [phone]

## 2022-03-14 ENCOUNTER — Other Ambulatory Visit: Payer: Medicare Other

## 2022-03-14 ENCOUNTER — Other Ambulatory Visit: Payer: Self-pay

## 2022-03-14 ENCOUNTER — Encounter: Payer: Self-pay | Admitting: Oncology

## 2022-03-14 NOTE — Telephone Encounter (Signed)
Order has been faxed

## 2022-03-15 ENCOUNTER — Inpatient Hospital Stay: Payer: Medicare Other

## 2022-03-15 ENCOUNTER — Ambulatory Visit
Admission: RE | Admit: 2022-03-15 | Discharge: 2022-03-15 | Disposition: A | Discharge status: Home / Self Care | Payer: Medicare Other | Source: Ambulatory Visit | Attending: Radiation Oncology | Admitting: Radiation Oncology

## 2022-03-15 ENCOUNTER — Encounter: Payer: Self-pay | Admitting: Radiation Oncology

## 2022-03-15 VITALS — BP 104/84 | HR 99 | Temp 96.2°F | Resp 14 | Ht 60.0 in | Wt <= 1120 oz

## 2022-03-15 DIAGNOSIS — C342 Malignant neoplasm of middle lobe, bronchus or lung: Secondary | ICD-10-CM | POA: Diagnosis present

## 2022-03-15 NOTE — Progress Notes (Signed)
Radiation Oncology Follow up Note  Name: Carol Shaffer   Date:   03/15/2022 MRN:  [id number] DOB: 02/06/1945    This 77 y.o. female presents to the clinic today for reevaluation of T2 metastatic disease which was originally planned for SBRT although delayed.  Secondary to pneumonia rib requiring prolonged hospitalization and rehab  REFERRING PROVIDER: Albina Billet, MD  HPI: Patient is a 77 year old female originally planned for SBRT to her T2 vertebral body which was PET positive consistent with metastatic disease.  Unfortunately she was hospitalized with pneumonia and had a prolonged hospitalization and rehab stay which has postponed that treatment.  She is seen today to reevaluate her and proceed with SBRT.  She is doing fairly well she is on nasal oxygen wheelchair-bound..  COMPLICATIONS OF TREATMENT: none  FOLLOW UP COMPLIANCE: keeps appointments   PHYSICAL EXAM:  BP 104/84   Pulse 99   Temp (!) 96.2 F (35.7 C)   Resp 14   Ht 5' (1.524 m)   Wt 67 lb 8 oz (30.6 kg)   BMI 13.18 kg/m  [name] female wheelchair-bound on nasal oxygen in NAD.  Well-developed well-nourished patient in NAD. HEENT reveals PERLA, EOMI, discs not visualized.  Oral cavity is clear. No oral mucosal lesions are identified. Neck is clear without evidence of cervical or supraclavicular adenopathy. Lungs are clear to A&P. Cardiac examination is essentially unremarkable with regular rate and rhythm without murmur rub or thrill. Abdomen is benign with no organomegaly or masses noted. Motor sensory and DTR levels are equal and symmetric in the upper and lower extremities. Cranial nerves II through XII are grossly intact. Proprioception is intact. No peripheral adenopathy or edema is identified. No motor or sensory levels are noted. Crude visual fields are within normal range.  RADIOLOGY RESULTS: CT scan and PET scan reviewed  PLAN: At this time elect to go ahead with SBRT to her T2 vertebral body 30 Gray in  5 fractions.  Unfortunately it has been 2 months since her prior simulation we have to restimulate her and replan her treatments.  I have personally 7 ordered that for next week.  Very low side effect profile of SBRT was explained to the patient.  She comprehends my recommendations well.  I would like to take this opportunity to thank you for allowing me to participate in the care of your patient.Noreene Filbert, MD

## 2022-03-15 NOTE — Addendum Note (Signed)
Addended by: Telford Nab on: 03/15/2022 08:02 AM   Modules accepted: Orders

## 2022-03-16 ENCOUNTER — Other Ambulatory Visit: Payer: Self-pay

## 2022-03-22 ENCOUNTER — Other Ambulatory Visit: Payer: Self-pay

## 2022-03-22 ENCOUNTER — Ambulatory Visit: Payer: Medicare Other | Admitting: Nurse Practitioner

## 2022-03-22 ENCOUNTER — Inpatient Hospital Stay: Payer: Medicare Other

## 2022-03-22 ENCOUNTER — Other Ambulatory Visit: Payer: Medicare Other

## 2022-03-22 ENCOUNTER — Ambulatory Visit
Admission: RE | Admit: 2022-03-22 | Discharge: 2022-03-22 | Disposition: A | Discharge status: Home / Self Care | Payer: Medicare Other | Source: Ambulatory Visit | Attending: Radiation Oncology | Admitting: Radiation Oncology

## 2022-03-22 DIAGNOSIS — C342 Malignant neoplasm of middle lobe, bronchus or lung: Secondary | ICD-10-CM | POA: Diagnosis not present

## 2022-03-29 DIAGNOSIS — C342 Malignant neoplasm of middle lobe, bronchus or lung: Secondary | ICD-10-CM | POA: Diagnosis not present

## 2022-04-02 ENCOUNTER — Inpatient Hospital Stay: Payer: Medicare Other | Attending: Oncology

## 2022-04-02 ENCOUNTER — Ambulatory Visit
Admission: RE | Admit: 2022-04-02 | Discharge: 2022-04-02 | Disposition: A | Discharge status: Home / Self Care | Payer: Medicare Other | Source: Ambulatory Visit | Attending: Radiation Oncology | Admitting: Radiation Oncology

## 2022-04-02 ENCOUNTER — Other Ambulatory Visit: Payer: Self-pay

## 2022-04-02 ENCOUNTER — Encounter: Payer: Self-pay | Admitting: *Deleted

## 2022-04-02 DIAGNOSIS — Z87891 Personal history of nicotine dependence: Secondary | ICD-10-CM | POA: Insufficient documentation

## 2022-04-02 DIAGNOSIS — C342 Malignant neoplasm of middle lobe, bronchus or lung: Secondary | ICD-10-CM | POA: Insufficient documentation

## 2022-04-02 DIAGNOSIS — Z923 Personal history of irradiation: Secondary | ICD-10-CM | POA: Insufficient documentation

## 2022-04-02 DIAGNOSIS — Z510 Encounter for antineoplastic radiation therapy: Secondary | ICD-10-CM | POA: Insufficient documentation

## 2022-04-02 DIAGNOSIS — R197 Diarrhea, unspecified: Secondary | ICD-10-CM | POA: Diagnosis not present

## 2022-04-02 DIAGNOSIS — C771 Secondary and unspecified malignant neoplasm of intrathoracic lymph nodes: Secondary | ICD-10-CM | POA: Insufficient documentation

## 2022-04-02 DIAGNOSIS — Z9221 Personal history of antineoplastic chemotherapy: Secondary | ICD-10-CM | POA: Diagnosis not present

## 2022-04-02 DIAGNOSIS — D801 Nonfamilial hypogammaglobulinemia: Secondary | ICD-10-CM | POA: Diagnosis not present

## 2022-04-02 DIAGNOSIS — Z9884 Bariatric surgery status: Secondary | ICD-10-CM | POA: Insufficient documentation

## 2022-04-02 DIAGNOSIS — Z79899 Other long term (current) drug therapy: Secondary | ICD-10-CM | POA: Insufficient documentation

## 2022-04-02 DIAGNOSIS — C7951 Secondary malignant neoplasm of bone: Secondary | ICD-10-CM | POA: Insufficient documentation

## 2022-04-02 DIAGNOSIS — Z5112 Encounter for antineoplastic immunotherapy: Secondary | ICD-10-CM | POA: Insufficient documentation

## 2022-04-02 LAB — RAD ONC ARIA SESSION SUMMARY
Course Elapsed Days: 0
Plan Fractions Treated to Date: 1
Plan Prescribed Dose Per Fraction: 6 Gy
Plan Total Fractions Prescribed: 5
Plan Total Prescribed Dose: 30 Gy
Reference Point Dosage Given to Date: 6 Gy
Reference Point Session Dosage Given: 6 Gy
Session Number: 1

## 2022-04-04 ENCOUNTER — Other Ambulatory Visit: Payer: Self-pay

## 2022-04-04 ENCOUNTER — Ambulatory Visit
Admission: RE | Admit: 2022-04-04 | Discharge: 2022-04-04 | Disposition: A | Discharge status: Home / Self Care | Payer: Medicare Other | Source: Ambulatory Visit | Attending: Radiation Oncology | Admitting: Radiation Oncology

## 2022-04-04 ENCOUNTER — Inpatient Hospital Stay: Payer: Medicare Other

## 2022-04-04 ENCOUNTER — Encounter: Payer: Self-pay | Admitting: *Deleted

## 2022-04-04 DIAGNOSIS — Z510 Encounter for antineoplastic radiation therapy: Secondary | ICD-10-CM | POA: Diagnosis not present

## 2022-04-04 LAB — RAD ONC ARIA SESSION SUMMARY
Course Elapsed Days: 2
Plan Fractions Treated to Date: 2
Plan Prescribed Dose Per Fraction: 6 Gy
Plan Total Fractions Prescribed: 5
Plan Total Prescribed Dose: 30 Gy
Reference Point Dosage Given to Date: 12 Gy
Reference Point Session Dosage Given: 6 Gy
Session Number: 2

## 2022-04-09 ENCOUNTER — Inpatient Hospital Stay: Payer: Medicare Other

## 2022-04-09 ENCOUNTER — Encounter: Payer: Self-pay | Admitting: Nurse Practitioner

## 2022-04-09 ENCOUNTER — Other Ambulatory Visit: Payer: Self-pay

## 2022-04-09 ENCOUNTER — Ambulatory Visit: Payer: Medicare Other

## 2022-04-09 ENCOUNTER — Inpatient Hospital Stay: Payer: Medicare Other | Admitting: Medical Oncology

## 2022-04-09 ENCOUNTER — Inpatient Hospital Stay (HOSPITAL_BASED_OUTPATIENT_CLINIC_OR_DEPARTMENT_OTHER): Payer: Medicare Other | Admitting: Nurse Practitioner

## 2022-04-09 VITALS — BP 95/63 | HR 86 | Temp 97.6°F | Resp 18 | Ht 60.0 in | Wt <= 1120 oz

## 2022-04-09 DIAGNOSIS — C342 Malignant neoplasm of middle lobe, bronchus or lung: Secondary | ICD-10-CM

## 2022-04-09 DIAGNOSIS — M899 Disorder of bone, unspecified: Secondary | ICD-10-CM | POA: Diagnosis not present

## 2022-04-09 DIAGNOSIS — Z510 Encounter for antineoplastic radiation therapy: Secondary | ICD-10-CM | POA: Diagnosis not present

## 2022-04-09 DIAGNOSIS — Z5112 Encounter for antineoplastic immunotherapy: Secondary | ICD-10-CM | POA: Diagnosis not present

## 2022-04-09 LAB — COMPREHENSIVE METABOLIC PANEL
ALT: 24 U/L (ref 0–44)
AST: 27 U/L (ref 15–41)
Albumin: 3.2 g/dL — ABNORMAL LOW (ref 3.5–5.0)
Alkaline Phosphatase: 87 U/L (ref 38–126)
Anion gap: 6 (ref 5–15)
BUN: 27 mg/dL — ABNORMAL HIGH (ref 8–23)
CO2: 24 mmol/L (ref 22–32)
Calcium: 8.2 mg/dL — ABNORMAL LOW (ref 8.9–10.3)
Chloride: 106 mmol/L (ref 98–111)
Creatinine, Ser: 0.61 mg/dL (ref 0.44–1.00)
GFR, Estimated: >60 mL/min (ref >60)
Glucose, Bld: 121 mg/dL — ABNORMAL HIGH (ref 70–99)
Potassium: 4 mmol/L (ref 3.5–5.1)
Sodium: 136 mmol/L (ref 135–145)
Total Bilirubin: 0.4 mg/dL (ref 0.3–1.2)
Total Protein: 5.6 g/dL — ABNORMAL LOW (ref 6.5–8.1)

## 2022-04-09 LAB — CBC WITH DIFFERENTIAL/PLATELET
Abs Immature Granulocytes: 0.06 10*3/uL (ref 0.00–0.07)
Basophils Absolute: 0 10*3/uL (ref 0.0–0.1)
Basophils Relative: 1 %
Eosinophils Absolute: 0.1 10*3/uL (ref 0.0–0.5)
Eosinophils Relative: 2 %
HCT: 35.1 % — ABNORMAL LOW (ref 36.0–46.0)
Hemoglobin: 10.7 g/dL — ABNORMAL LOW (ref 12.0–15.0)
Immature Granulocytes: 1 %
Lymphocytes Relative: 27 %
Lymphs Abs: 1.9 10*3/uL (ref 0.7–4.0)
MCH: 26.6 pg (ref 26.0–34.0)
MCHC: 30.5 g/dL (ref 30.0–36.0)
MCV: 87.3 fL (ref 80.0–100.0)
Monocytes Absolute: 1.3 10*3/uL — ABNORMAL HIGH (ref 0.1–1.0)
Monocytes Relative: 18 %
Neutro Abs: 3.6 10*3/uL (ref 1.7–7.7)
Neutrophils Relative %: 51 %
Platelets: 284 10*3/uL (ref 150–400)
RBC: 4.02 MIL/uL (ref 3.87–5.11)
RDW: 18.4 % — ABNORMAL HIGH (ref 11.5–15.5)
WBC: 6.9 10*3/uL (ref 4.0–10.5)
nRBC: 0 % (ref 0.0–0.2)

## 2022-04-09 MED ORDER — SODIUM CHLORIDE 0.9% FLUSH
10.0000 mL | INTRAVENOUS | Status: DC | PRN
  Administered 2022-04-09: 10 mL via INTRAVENOUS
  Filled 2022-04-09: qty 10

## 2022-04-09 MED ORDER — SODIUM CHLORIDE 0.9 % IV SOLN
1500.0000 mg | Freq: Once | INTRAVENOUS | Status: AC
  Administered 2022-04-09: 1500 mg via INTRAVENOUS
  Filled 2022-04-09: qty 30

## 2022-04-09 MED ORDER — HEPARIN SOD (PORK) LOCK FLUSH 100 UNIT/ML IV SOLN
500.0000 [IU] | Freq: Once | INTRAVENOUS | Status: AC | PRN
  Administered 2022-04-09: 500 [IU]
  Filled 2022-04-09: qty 5

## 2022-04-09 MED ORDER — SODIUM CHLORIDE 0.9 % IV SOLN
Freq: Once | INTRAVENOUS | Status: AC
  Filled 2022-04-09: qty 250

## 2022-04-09 NOTE — Progress Notes (Signed)
Hematology/Oncology Consult Note [facility]  Telephone:([phone] [phone]  Patient Care Team: Albina Billet, MD as PCP - General (Internal Medicine) Marden Noble, MD (Internal Medicine) Bary Castilla, Forest Gleason, MD (General Surgery) Telford Nab, RN as Oncology Nurse Navigator Noreene Filbert, MD as Referring Physician (Radiation Oncology) Sindy Guadeloupe, MD as Consulting Physician (Oncology)   Name of the patient: Carol Shaffer  TA:[id number]  05/22/1945   Date of visit: 04/09/22  Diagnosis- non-small cell lung cancer involving the right middle lobe stage IV T1b N2 M1       Chief complaint/ Reason for visit-on treatment assessment prior to cycle 2 of maintenance durvalumab  Heme/Onc history:  Patient is a 77 year old female with a history of lung cancer in 2012 s/p right upper lobe resection.  Low dose chest CT on 09/04/2018 revealed multiple small pulmonary nodules are noted in the lungs bilaterally. In addition, there was a 1.53 cm concerning nodule in the right lower lobe. There was diffuse bronchial wall thickening with mild centrilobular and paraseptal emphysema; imaging findings suggestive of underlying COPD.PET scan on 09/16/2018 revealed low level FDG uptake associated with the right lower lobe lung nodule (SUV 1.57). This was a nonspecific finding. Given the size of this nodule and the low level uptake, findings may reflect inflammatory or infectious nodule. Malignancy was not entirely excluded.  Low dose chest CT on 12/04/2018 revealed a 15.3 mm RLL nodule unchanged in size.   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  There were some ground-glass attenuation components, but is predominantly solid in appearance with some internal air bronchograms, with macrolobulated and spiculated borders, clearly increased  in size.   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  Tumor was positive for CK7 and TTF-1 and negative for p40.  She is not a surgical candidate.   She received 6000 cGy SBRT to the RLL from 08/05/2019 - 08/17/2019.   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  History of iron deficiency anemia and she has undergone work-up in the past.  She has a history of gastric bypass surgery.    Patient hadCT scan of breast 2023 which showed narrowing and occlusion of the right middle lobe bronchus which was looking more prominent as compared to prior scans.  There was also concern for paratracheal adenopathy which had increased in size as compared to prior scans.  Patient was seen by pulmonary and underwent bronchoscopy guided biopsy.  Bronchial biopsies of the right middle lobe did not show any malignancy however cytology from station 7 and station 4R lymph node was consistent with adenocarcinoma.  PET scan also showed hypermetabolism in the T2 vertebral body lesion concerning for metastatic disease   Patient completed concurrent chemoradiation with a curative intent.  Repeat scan showed partial response and plan is to proceed with maintenance durvalumab.  There was not adequate sample to perform NGS testing.  PD-L1 less than 1%.    Interval history- Patient is 77 year old female who returns to clinic for consideration of cycle 2 of maintenance durvalumab. She continues SBRT to T2 vertebral body. Her back pain has resolved. She is tolerating treatment well. She has some diarrhea but unsure if related to her treatment and not bothersome. She is eager to get off of oxygen and has been able  to take short bouts off oxygen at home. Sleeps with oxygen. No falls or fractures. Uses a walker at home. No rash, cough. Denies other complaints.   ECOG PS- 2 Pain scale- 0 Opioid associated constipation- no  Review of systems-  Review of Systems  Constitutional:  Positive for malaise/fatigue. Negative for chills, fever and weight loss.  HENT:  Negative for hearing loss, nosebleeds, sore throat and tinnitus.   Eyes:  Negative for blurred vision and double vision.  Respiratory:  Negative for cough, hemoptysis, shortness of breath and wheezing.        Chest wall pain  Cardiovascular:  Negative for chest pain, palpitations and leg swelling.  Gastrointestinal:  Negative for abdominal pain, blood in stool, constipation, diarrhea, melena, nausea and vomiting.  Genitourinary:  Negative for dysuria and urgency.  Musculoskeletal:  Negative for back pain, falls, joint pain and myalgias.  Skin:  Negative for itching and rash.  Neurological:  Negative for dizziness, tingling, sensory change, loss of consciousness, weakness and headaches.  Endo/Heme/Allergies:  Negative for environmental allergies. Does not bruise/bleed easily.  Psychiatric/Behavioral:  Negative for depression. The patient is not nervous/anxious and does not have insomnia.       Allergies  Allergen Reactions   Hydromorphone Hcl Itching   Bentyl [Dicyclomine] Nausea And Vomiting   Biaxin [Clarithromycin] Other (See Comments)    hallucinations   Budesonide Other (See Comments)    Ulcer   Erythromycin Nausea And Vomiting   Reglan [Metoclopramide] Other (See Comments)    tremors     Past Medical History:  Diagnosis Date   Arthritis    hands   Avascular necrosis of hip, right ([place])    Blepharospasm    Cancer ([place]) 2011   Right Upper Lobe Lobectomy   Chronic diarrhea    Chronic diarrhea    Collagenous colitis    Complication of anesthesia    usually wakes up during procedures  (endoscopy and colonoscopy)   COPD (chronic obstructive pulmonary disease) ([place])    COVID 2022   Depression    Diverticulosis    Gastric outlet obstruction    Headache    every couple of days   Hemorrhoid    History of Crohn's disease    Hypoglycemic disorder    IDA  (iron deficiency anemia)    Intestinal adhesions    Multiple gastric ulcers    Multiple gastric ulcers    Nausea and vomiting 07/03/2016   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[place])    Stroke ([place]) 8 yrs ago   double vision left eye     Past Surgical History:  Procedure Laterality Date   APPENDECTOMY  1989   botox injections for blepharospasm     BREAST SURGERY     CATARACT EXTRACTION     COLON RESECTION  2005   COLONOSCOPY  10-29-2008   [name]   COLONOSCOPY WITH PROPOFOL N/A 03/05/2017   Procedure: COLONOSCOPY WITH PROPOFOL;  Surgeon: Toledo, Benay Pike, MD;  Location: [facility];  Service: Gastroenterology;  Laterality: N/A;   COLOSTOMY REVERSAL  2006   ESOPHAGOGASTRODUODENOSCOPY (EGD) WITH PROPOFOL N/A 07/18/2016   Procedure: ESOPHAGOGASTRODUODENOSCOPY (EGD) WITH PROPOFOL;  Surgeon: Robert Bellow, MD;  Location: [facility];  Service: Endoscopy;  Laterality: N/A;   ESOPHAGOGASTRODUODENOSCOPY (EGD) WITH PROPOFOL N/A 08/03/2016   Procedure: ESOPHAGOGASTRODUODENOSCOPY (EGD) WITH PROPOFOL;  Surgeon: Robert Bellow, MD;  Location: [facility];  Service: Endoscopy;  Laterality: N/A;   ESOPHAGOGASTRODUODENOSCOPY (EGD) WITH PROPOFOL N/A 09/01/2018   Procedure: ESOPHAGOGASTRODUODENOSCOPY (EGD)  WITH PROPOFOL;  Surgeon: Toledo, Benay Pike, MD;  Location: [facility];  Service: Gastroenterology;  Laterality: N/A;   ESOPHAGOGASTRODUODENOSCOPY (EGD) WITH PROPOFOL N/A 12/01/2018   Procedure: ESOPHAGOGASTRODUODENOSCOPY (EGD) WITH PROPOFOL;  Surgeon: Toledo, Benay Pike, MD;  Location: [facility];  Service: Gastroenterology;  Laterality: N/A;   ESOPHAGOGASTRODUODENOSCOPY (EGD) WITH PROPOFOL N/A 02/23/2019   Procedure: ESOPHAGOGASTRODUODENOSCOPY (EGD) WITH PROPOFOL;  Surgeon: Lucilla Lame, MD;  Location: [facility];  Service: Endoscopy;  Laterality: N/A;   ESOPHAGOSCOPY WITH DILITATION  2012,2015   [facility] [place]   EYE SURGERY     FLEXIBLE BRONCHOSCOPY N/A 10/30/2021    Procedure: FLEXIBLE BRONCHOSCOPY;  Surgeon: Armando Reichert, MD;  Location: [facility];  Service: Pulmonary;  Laterality: N/A;   GASTRECTOMY     gastric ulcer  1989, 1991   IR IMAGING GUIDED PORT INSERTION  11/14/2021   JOINT REPLACEMENT     right total hip arthroplasty 03/03/02   LAPAROSCOPIC LYSIS OF ADHESIONS     LUNG CANCER SURGERY  2011   LYSIS OF ADHESION     PLACEMENT OF BREAST IMPLANTS  1973   thoracotomy with right upper lobectomy and central compartment node dissection     UPPER GASTROINTESTINAL ENDOSCOPY  10-29-2008   [name]   VIDEO BRONCHOSCOPY WITH ENDOBRONCHIAL ULTRASOUND N/A 10/30/2021   Procedure: VIDEO BRONCHOSCOPY WITH ENDOBRONCHIAL ULTRASOUND;  Surgeon: Armando Reichert, MD;  Location: [facility];  Service: Pulmonary;  Laterality: N/A;    Social History   Socioeconomic History   Marital status: Widowed    Spouse name: Not on file   Number of children: 2   Years of education: Not on file   Highest education level: Not on file  Occupational History   Occupation: retired    Comment: Pharmacist, hospital  Tobacco Use   Smoking status: Former    Packs/day: 1.00    Years: 25.00    Total pack years: 25.00    Types: Cigarettes    Quit date: 01/30/2008    Years since quitting: 14.2   Smokeless tobacco: Never  Vaping Use   Vaping Use: Never used  Substance and Sexual Activity   Alcohol use: Yes    Comment: occ. for holiday   Drug use: No   Sexual activity: Not Currently  Other Topics Concern   Not on file  Social History Narrative   Lives alone    Social Determinants of Health   Financial Resource Strain: Not on file  Food Insecurity: No Food Insecurity (02/13/2022)   Hunger Vital Sign    Worried About Running Out of Food in the Last Year: Never true    Ran Out of Food in the Last Year: Never true  Transportation Needs: Unmet Transportation Needs (04/09/2022)   PRAPARE - Hydrologist (Medical): Yes    Lack of Transportation (Non-Medical): Yes   Physical Activity: Not on file  Stress: Not on file  Social Connections: Not on file  Intimate Partner Violence: Not At Risk (02/13/2022)   Humiliation, Afraid, Rape, and Kick questionnaire    Fear of Current or Ex-Partner: No    Emotionally Abused: No    Physically Abused: No    Sexually Abused: No    Family History  Problem Relation Age of Onset   Hypertension Mother    Diabetes Father      Current Outpatient Medications:    acetaminophen (TYLENOL) 500 MG tablet, Take 500 mg by mouth every 6 (six) hours as needed., Disp: , Rfl:  brimonidine-timolol (COMBIGAN) 0.2-0.5 % ophthalmic solution, INT 1 GTT IN OU BID, Disp: , Rfl:    Cholecalciferol (VITAMIN D3) 1000 units CAPS, Take 2,000 Units by mouth daily., Disp: , Rfl:    diltiazem (CARDIZEM) 60 MG tablet, Take 1 tablet (60 mg total) by mouth every 6 (six) hours., Disp: 180 tablet, Rfl: 0   docusate (COLACE) 50 MG/5ML liquid, Take by mouth daily., Disp: , Rfl:    fluticasone (FLONASE) 50 MCG/ACT nasal spray, Place 2 sprays into both nostrils daily., Disp: , Rfl:    HYDROcodone-acetaminophen (NORCO/VICODIN) 5-325 MG tablet, Take 1 tablet by mouth every 6 (six) hours as needed for moderate pain., Disp: 15 tablet, Rfl: 0   ipratropium-albuterol (DUONEB) 0.5-2.5 (3) MG/3ML SOLN, Take 3 mLs by nebulization every 6 (six) hours., Disp: 360 mL, Rfl: 0   loratadine (CLARITIN) 10 MG tablet, Take 10 mg by mouth daily., Disp: , Rfl:    magic mouthwash (nystatin, lidocaine, diphenhydrAMINE, alum & mag hydroxide) suspension, Swish and swallow 5 mLs 4 (four) times daily as needed for mouth pain., Disp: 240 mL, Rfl: 1   magnesium chloride (SLOW-MAG) 64 MG TBEC SR tablet, Take 1 tablet (64 mg total) by mouth daily., Disp: 14 tablet, Rfl: 0   Multiple Vitamin (MULTI-VITAMINS) TABS, Take 1 tablet by mouth daily. once daily., Disp: , Rfl:    pantoprazole (PROTONIX) 40 MG tablet, Take 40 mg by mouth 2 (two) times daily., Disp: , Rfl:    potassium  chloride (KLOR-CON M) 10 MEQ tablet, Take 1 tablet (10 mEq total) by mouth daily., Disp: 14 tablet, Rfl: 0   sucralfate (CARAFATE) 1 G tablet, Take 1 g by mouth 4 (four) times daily. , Disp: , Rfl:    traZODone (DESYREL) 150 MG tablet, Take 300 mg by mouth at bedtime. , Disp: , Rfl:    venlafaxine XR (EFFEXOR-XR) 150 MG 24 hr capsule, Take 150 mg by mouth daily with breakfast. , Disp: , Rfl:  No current facility-administered medications for this visit.  Facility-Administered Medications Ordered in Other Visits:    heparin lock flush 100 UNIT/ML injection, , , ,   Physical exam:  Vitals:   04/09/22 0947  BP: 95/63  Pulse: 86  Resp: 18  Temp: 97.6 F (36.4 C)  TempSrc: Tympanic  SpO2: 97%  Weight: 67 lb (30.4 kg)  Height: 5' (1.524 m)   Physical Exam Constitutional:      Appearance: She is not ill-appearing.     Comments: Thin build. Sitting in a wheelchair.  Appears in no acute distress.  On 2 L of oxygen but able to go to room air with SpO2 of 95% at rest.   Cardiovascular:     Rate and Rhythm: Normal rate and regular rhythm.     Pulses: Normal pulses.  Pulmonary:     Effort: Pulmonary effort is normal.     Breath sounds: Normal breath sounds.  Abdominal:     General: There is no distension.     Palpations: Abdomen is soft.     Tenderness: There is no abdominal tenderness.  Skin:    General: Skin is warm and dry.  Neurological:     Mental Status: She is alert and oriented to person, place, and time.  Psychiatric:        Mood and Affect: Mood normal.        Behavior: Behavior normal.         Latest Ref Rng & Units 04/09/2022    9:09 AM  CMP  Glucose 70 - 99 mg/dL 121   BUN 8 - 23 mg/dL 27   Creatinine 0.44 - 1.00 mg/dL 0.61   Sodium 135 - 145 mmol/L 136   Potassium 3.5 - 5.1 mmol/L 4.0   Chloride 98 - 111 mmol/L 106   CO2 22 - 32 mmol/L 24   Calcium 8.9 - 10.3 mg/dL 8.2   Total Protein 6.5 - 8.1 g/dL 5.6   Total Bilirubin 0.3 - 1.2 mg/dL 0.4   Alkaline Phos  38 - 126 U/L 87   AST 15 - 41 U/L 27   ALT 0 - 44 U/L 24       Latest Ref Rng & Units 04/09/2022    9:09 AM  CBC  WBC 4.0 - 10.5 K/uL 6.9   Hemoglobin 12.0 - 15.0 g/dL 10.7   Hematocrit 36.0 - 46.0 % 35.1   Platelets 150 - 400 K/uL 284    No images are attached to the encounter.  No results found.   Assessment and plan- Patient is a 77 y.o. female with   History of non-small cell lung cancer involving the right middle lobe - stage IV T1b N2 M1. S/p concurrent chemotherapy with carbo-taxol and radiation. CT showed partial response. She has initiated maintenance durvalumab. Labs reviewed and acceptable for treatment today. Proceed with cycle 2 of maintenance durvalumab. Will check thyroid labs at next visit. Reviewed risk of immune related toxicity and immune mediated adverse reactions.  Healthcare associated pneumonia- s/p hospitalization. She has been on 2L of oxygen. Able to wean today to room air without desat. We reviewed that she may require some oxygen to maintain SpO2 > 93% if ambulating, exerting herself, etc. She is able to monitor her oxygen levels at home and will continue intermittent oxygen as needed.  Lytic lesion- to T2 vertebral body. She is currently receiving palliative radiation to T2. Tolerating well and has improved her pain. We discussed the role of bisphonates today as well as possible osteonecrosis of the jaw and need for dental clearance. She does have a dentist and will reach out to them for appointment and dental work. Once we have clearance, we can get add to her treatment plan. She will continue calcium and vitamin D in interim.  Port-a-cath: in place and functioning appropriately  Disposition:  Proceed with durvalumab today Rtc in 4 weeks for port/labs (cbc, cmp, tsh, t4), Dr Janese Banks, +/- cycle 3 durvalumab  Thank you fro allowing me to participate in the care of this very pleasant patient.    Visit Diagnosis 1. Encounter for antineoplastic immunotherapy   2.  Non-small cell cancer of middle lobe of right lung ([place])   3. Bone lesion    Beckey Rutter, DNP, AGNP-C, [place] at [facility] [phone] (clinic) 04/09/2022

## 2022-04-09 NOTE — Patient Instructions (Signed)
[facility]  Discharge Instructions: Thank you for choosing [facility] to provide your oncology and hematology care.  If you have a lab appointment with the Cancer Center, please go directly to the Cancer Center and check in at the registration area.  Wear comfortable clothing and clothing appropriate for easy access to any Portacath or PICC line.   We strive to give you quality time with your provider. You may need to reschedule your appointment if you arrive late (15 or more minutes).  Arriving late affects you and other patients whose appointments are after yours.  Also, if you miss three or more appointments without notifying the office, you may be dismissed from the clinic at the provider's discretion.      For prescription refill requests, have your pharmacy contact our office and allow 72 hours for refills to be completed.    Today you received the following chemotherapy and/or immunotherapy agents IMFINZI      To help prevent nausea and vomiting after your treatment, we encourage you to take your nausea medication as directed.  BELOW ARE SYMPTOMS THAT SHOULD BE REPORTED IMMEDIATELY: *FEVER GREATER THAN 100.4 F (38 C) OR HIGHER *CHILLS OR SWEATING *NAUSEA AND VOMITING THAT IS NOT CONTROLLED WITH YOUR NAUSEA MEDICATION *UNUSUAL SHORTNESS OF BREATH *UNUSUAL BRUISING OR BLEEDING *URINARY PROBLEMS (pain or burning when urinating, or frequent urination) *BOWEL PROBLEMS (unusual diarrhea, constipation, pain near the anus) TENDERNESS IN MOUTH AND THROAT WITH OR WITHOUT PRESENCE OF ULCERS (sore throat, sores in mouth, or a toothache) UNUSUAL RASH, SWELLING OR PAIN  UNUSUAL VAGINAL DISCHARGE OR ITCHING   Items with * indicate a potential emergency and should be followed up as soon as possible or go to the Emergency Department if any problems should occur.  Please show the CHEMOTHERAPY ALERT CARD or IMMUNOTHERAPY ALERT CARD at check-in to  the Emergency Department and triage nurse.  Should you have questions after your visit or need to cancel or reschedule your appointment, please contact [facility]  [phone] and follow the prompts.  Office hours are 8:00 a.m. to 4:30 p.m. Monday - Friday. Please note that voicemails left after 4:00 p.m. may not be returned until the following business day.  We are closed weekends and major holidays. You have access to a nurse at all times for urgent questions. Please call the main number to the clinic [phone] and follow the prompts.  For any non-urgent questions, you may also contact your provider using MyChart. We now offer e-Visits for anyone 18 and older to request care online for non-urgent symptoms. For details visit [email or web address].   Also download the MyChart app! Go to the app store, search "MyChart", open the app, select [facility], and log in with your MyChart username and password.  Durvalumab Injection What is this medication? DURVALUMAB (dur VAL ue mab) treats some types of cancer. It works by helping your immune system slow or stop the spread of cancer cells. It is a monoclonal antibody. This medicine may be used for other purposes; ask your health care provider or pharmacist if you have questions. COMMON BRAND NAME(S): IMFINZI What should I tell my care team before I take this medication? They need to know if you have any of these conditions: Allogeneic stem cell transplant (uses someone else's stem cells) Autoimmune diseases, such as Crohn disease, ulcerative colitis, lupus History of chest radiation Nervous system problems, such as Guillain-Barre syndrome, myasthenia gravis Organ   transplant An unusual or allergic reaction to durvalumab, other medications, foods, dyes, or preservatives Pregnant or trying to get pregnant Breast-feeding How should I use this medication? This medication is infused into a vein. It is given by  your care team in a hospital or clinic setting. A special MedGuide will be given to you before each treatment. Be sure to read this information carefully each time. Talk to your care team about the use of this medication in children. Special care may be needed. Overdosage: If you think you have taken too much of this medicine contact a poison control center or emergency room at once. NOTE: This medicine is only for you. Do not share this medicine with others. What if I miss a dose? Keep appointments for follow-up doses. It is important not to miss your dose. Call your care team if you are unable to keep an appointment. What may interact with this medication? Interactions have not been studied. This list may not describe all possible interactions. Give your health care provider a list of all the medicines, herbs, non-prescription drugs, or dietary supplements you use. Also tell them if you smoke, drink alcohol, or use illegal drugs. Some items may interact with your medicine. What should I watch for while using this medication? Your condition will be monitored carefully while you are receiving this medication. You may need blood work while taking this medication. This medication may cause serious skin reactions. They can happen weeks to months after starting the medication. Contact your care team right away if you notice fevers or flu-like symptoms with a rash. The rash may be red or purple and then turn into blisters or peeling of the skin. You may also notice a red rash with swelling of the face, lips, or lymph nodes in your neck or under your arms. Tell your care team right away if you have any change in your eyesight. Talk to your care team if you may be pregnant. Serious birth defects can occur if you take this medication during pregnancy and for 3 months after the last dose. You will need a negative pregnancy test before starting this medication. Contraception is recommended while taking this  medication and for 3 months after the last dose. Your care team can help you find the option that works for you. Do not breastfeed while taking this medication and for 3 months after the last dose. What side effects may I notice from receiving this medication? Side effects that you should report to your care team as soon as possible: Allergic reactions--skin rash, itching, hives, swelling of the face, lips, tongue, or throat Dry cough, shortness of breath or trouble breathing Eye pain, redness, irritation, or discharge with blurry or decreased vision Heart muscle inflammation--unusual weakness or fatigue, shortness of breath, chest pain, fast or irregular heartbeat, dizziness, swelling of the ankles, feet, or hands Hormone gland problems--headache, sensitivity to light, unusual weakness or fatigue, dizziness, fast or irregular heartbeat, increased sensitivity to cold or heat, excessive sweating, constipation, hair loss, increased thirst or amount of urine, tremors or shaking, irritability Infusion reactions--chest pain, shortness of breath or trouble breathing, feeling faint or lightheaded Kidney injury (glomerulonephritis)--decrease in the amount of urine, red or dark brown urine, foamy or bubbly urine, swelling of the ankles, hands, or feet Liver injury--right upper belly pain, loss of appetite, nausea, light-colored stool, dark yellow or brown urine, yellowing skin or eyes, unusual weakness or fatigue Pain, tingling, or numbness in the hands or feet, muscle weakness, change   in vision, confusion or trouble speaking, loss of balance or coordination, trouble walking, seizures Rash, fever, and swollen lymph nodes Redness, blistering, peeling, or loosening of the skin, including inside the mouth Sudden or severe stomach pain, bloody diarrhea, fever, nausea, vomiting Side effects that usually do not require medical attention (report these to your care team if they continue or are bothersome): Bone,  joint, or muscle pain Diarrhea Fatigue Loss of appetite Nausea Skin rash This list may not describe all possible side effects. Call your doctor for medical advice about side effects. You may report side effects to FDA at 1-800-FDA-1088. Where should I keep my medication? This medication is given in a hospital or clinic. It will not be stored at home. NOTE: This sheet is a summary. It may not cover all possible information. If you have questions about this medicine, talk to your doctor, pharmacist, or health care provider.  2023 Elsevier/Gold Standard (2021-05-19 00:00:00)     

## 2022-04-10 ENCOUNTER — Encounter: Payer: Self-pay | Admitting: *Deleted

## 2022-04-10 ENCOUNTER — Ambulatory Visit
Admission: RE | Admit: 2022-04-10 | Discharge: 2022-04-10 | Disposition: A | Discharge status: Home / Self Care | Payer: Medicare Other | Source: Ambulatory Visit | Attending: Radiation Oncology | Admitting: Radiation Oncology

## 2022-04-10 ENCOUNTER — Other Ambulatory Visit: Payer: Self-pay

## 2022-04-10 ENCOUNTER — Inpatient Hospital Stay: Payer: Medicare Other

## 2022-04-10 DIAGNOSIS — Z510 Encounter for antineoplastic radiation therapy: Secondary | ICD-10-CM | POA: Diagnosis not present

## 2022-04-10 LAB — RAD ONC ARIA SESSION SUMMARY
Course Elapsed Days: 8
Plan Fractions Treated to Date: 3
Plan Prescribed Dose Per Fraction: 6 Gy
Plan Total Fractions Prescribed: 5
Plan Total Prescribed Dose: 30 Gy
Reference Point Dosage Given to Date: 18 Gy
Reference Point Session Dosage Given: 6 Gy
Session Number: 3

## 2022-04-11 ENCOUNTER — Ambulatory Visit: Payer: Medicare Other

## 2022-04-12 ENCOUNTER — Encounter: Payer: Self-pay | Admitting: Oncology

## 2022-04-12 ENCOUNTER — Encounter: Payer: Self-pay | Admitting: *Deleted

## 2022-04-12 ENCOUNTER — Ambulatory Visit
Admission: RE | Admit: 2022-04-12 | Discharge: 2022-04-12 | Disposition: A | Discharge status: Home / Self Care | Payer: Medicare Other | Source: Ambulatory Visit | Attending: Radiation Oncology | Admitting: Radiation Oncology

## 2022-04-12 ENCOUNTER — Other Ambulatory Visit: Payer: Self-pay

## 2022-04-12 ENCOUNTER — Inpatient Hospital Stay: Payer: Medicare Other

## 2022-04-12 DIAGNOSIS — Z510 Encounter for antineoplastic radiation therapy: Secondary | ICD-10-CM | POA: Diagnosis not present

## 2022-04-12 LAB — RAD ONC ARIA SESSION SUMMARY
Course Elapsed Days: 10
Plan Fractions Treated to Date: 4
Plan Prescribed Dose Per Fraction: 6 Gy
Plan Total Fractions Prescribed: 5
Plan Total Prescribed Dose: 30 Gy
Reference Point Dosage Given to Date: 24 Gy
Reference Point Session Dosage Given: 6 Gy
Session Number: 4

## 2022-04-16 ENCOUNTER — Ambulatory Visit: Payer: Medicare Other

## 2022-04-16 ENCOUNTER — Inpatient Hospital Stay: Payer: Medicare Other

## 2022-04-18 ENCOUNTER — Ambulatory Visit
Admission: RE | Admit: 2022-04-18 | Discharge: 2022-04-18 | Disposition: A | Discharge status: Home / Self Care | Payer: Medicare Other | Source: Ambulatory Visit | Attending: Radiation Oncology | Admitting: Radiation Oncology

## 2022-04-18 ENCOUNTER — Other Ambulatory Visit: Payer: Self-pay | Admitting: *Deleted

## 2022-04-18 ENCOUNTER — Other Ambulatory Visit: Payer: Medicare Other

## 2022-04-18 ENCOUNTER — Other Ambulatory Visit: Payer: Self-pay

## 2022-04-18 ENCOUNTER — Encounter: Payer: Self-pay | Admitting: *Deleted

## 2022-04-18 ENCOUNTER — Inpatient Hospital Stay: Payer: Medicare Other

## 2022-04-18 ENCOUNTER — Telehealth: Payer: Self-pay | Admitting: *Deleted

## 2022-04-18 ENCOUNTER — Encounter: Payer: Medicare Other | Admitting: Hospice and Palliative Medicine

## 2022-04-18 DIAGNOSIS — C342 Malignant neoplasm of middle lobe, bronchus or lung: Secondary | ICD-10-CM

## 2022-04-18 DIAGNOSIS — R197 Diarrhea, unspecified: Secondary | ICD-10-CM

## 2022-04-18 DIAGNOSIS — Z510 Encounter for antineoplastic radiation therapy: Secondary | ICD-10-CM | POA: Diagnosis not present

## 2022-04-18 LAB — RAD ONC ARIA SESSION SUMMARY
Course Elapsed Days: 16
Plan Fractions Treated to Date: 5
Plan Prescribed Dose Per Fraction: 6 Gy
Plan Total Fractions Prescribed: 5
Plan Total Prescribed Dose: 30 Gy
Reference Point Dosage Given to Date: 30 Gy
Reference Point Session Dosage Given: 6 Gy
Session Number: 5

## 2022-04-18 IMAGING — CT CT CHEST W/O CM
1 series · 15 of 34 positions shown, 19 images · non-contrast
Comparison: 08/25/2020

CLINICAL DATA: Lung cancer restaging. Remote history of right upper
lobe cancer in 2732. More recently with biopsy-proven non-small cell
carcinoma of the right lower lobe, status post SBRT 08/17/2019.



[Series 2: thorax · axial · 0.51mm/px · z∈[-608,-366]mm · 15 of 143 slices shown, 19 images]
[im 11/143  mediastinal]
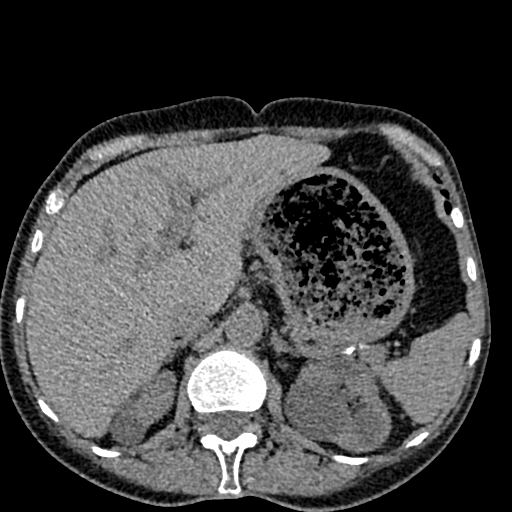
[im 11/143  lung]
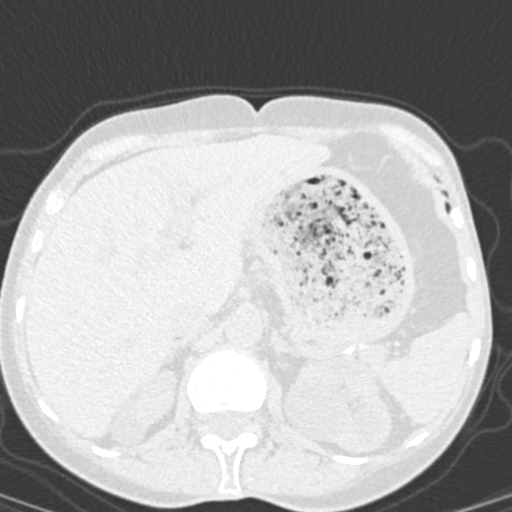
[im 22/143  lung]
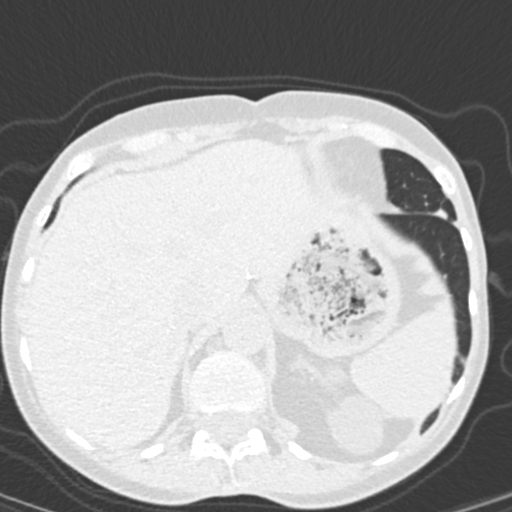
[im 29/143  lung]
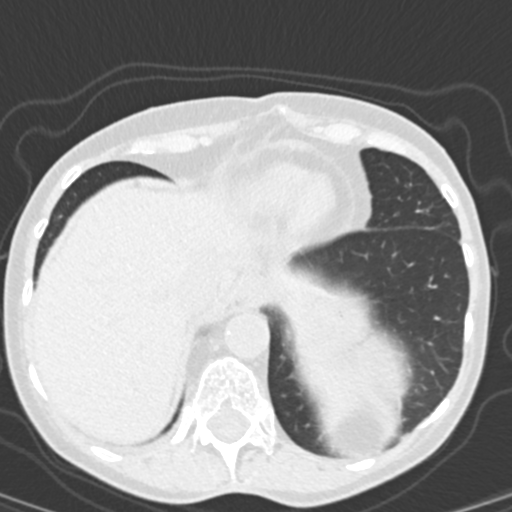
[im 37/143  lung]
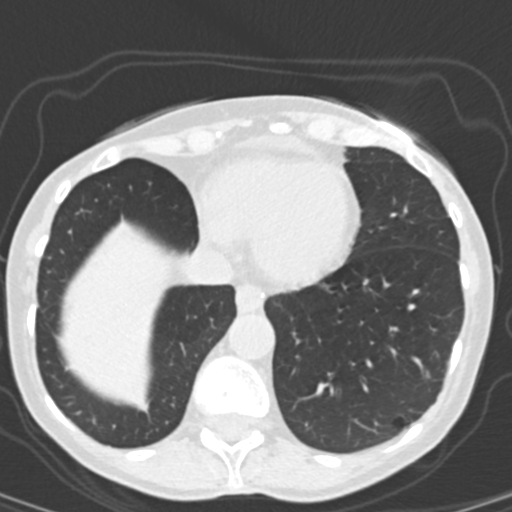
[im 48/143  mediastinal]
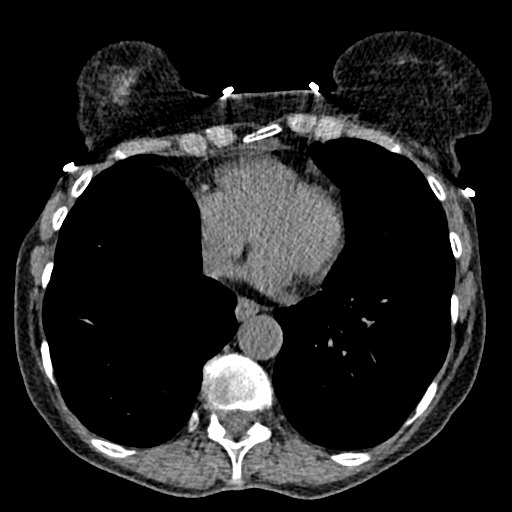
[im 48/143  lung]
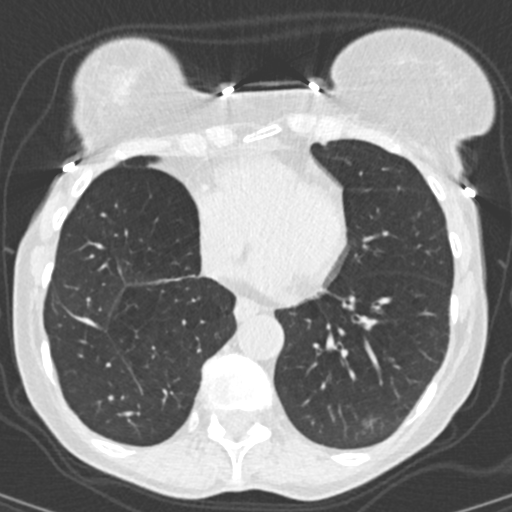
[im 57/143  lung]
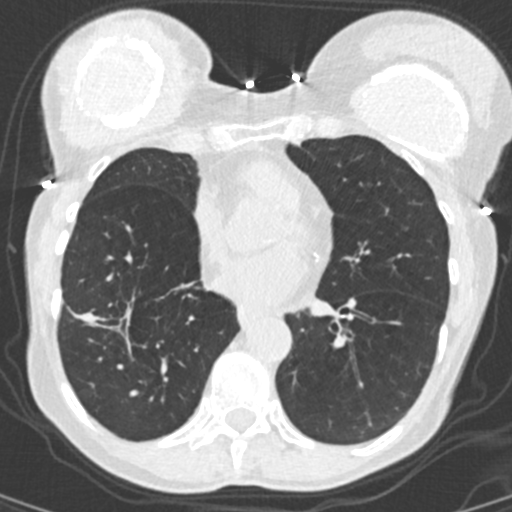
[im 64/143  lung]
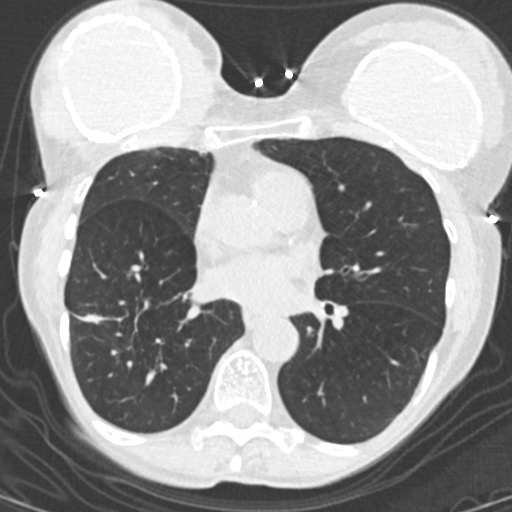
[im 74/143  lung]
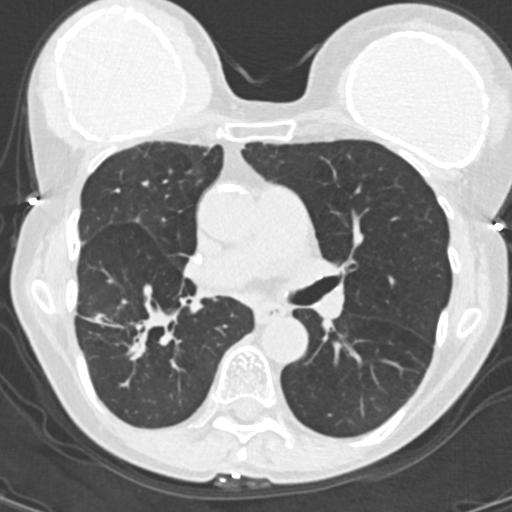
[im 79/143  mediastinal]
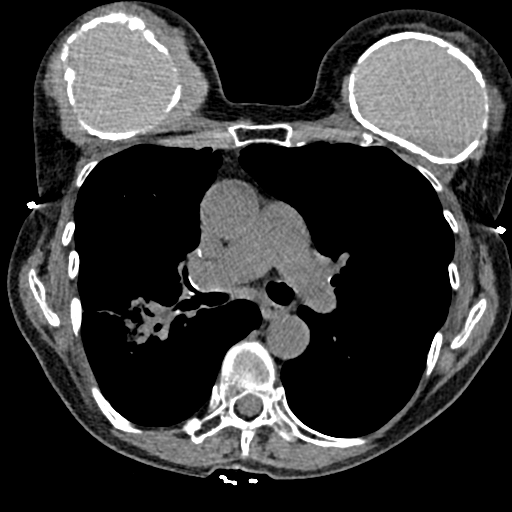
[im 79/143  lung]
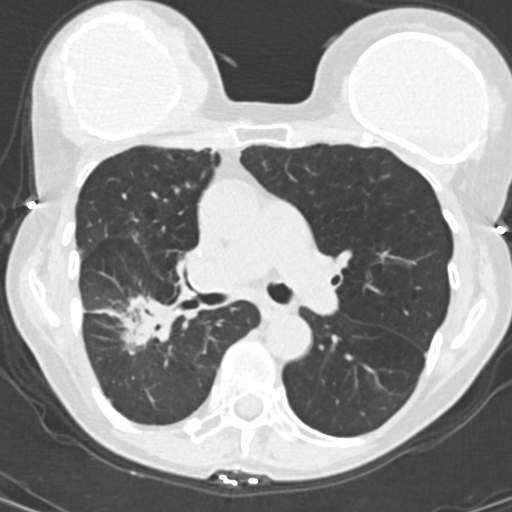
[im 86/143  lung]
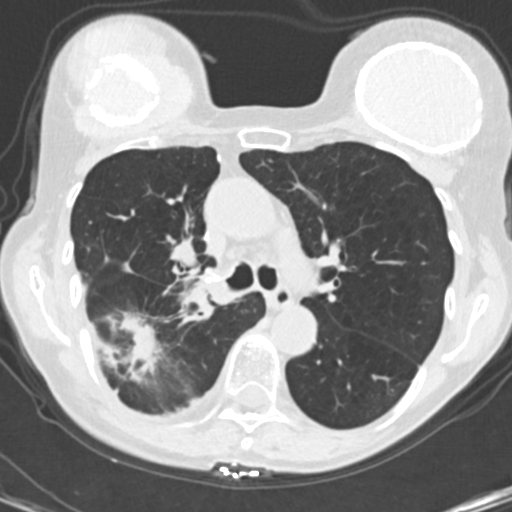
[im 95/143  lung]
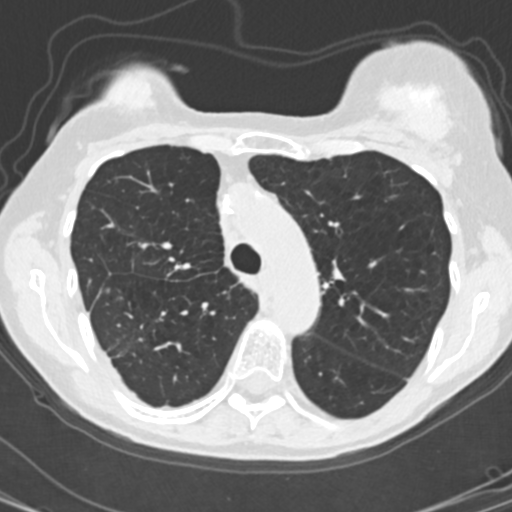
[im 106/143  lung]
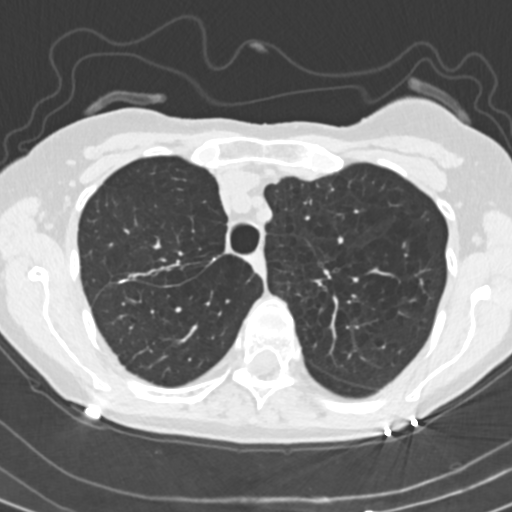
[im 114/143  mediastinal]
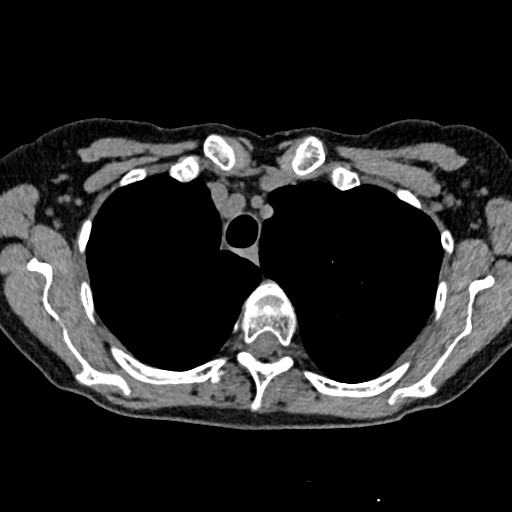
[im 114/143  lung]
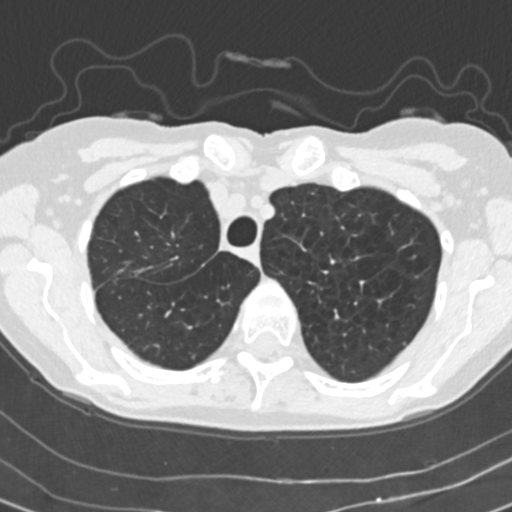
[im 121/143  lung]
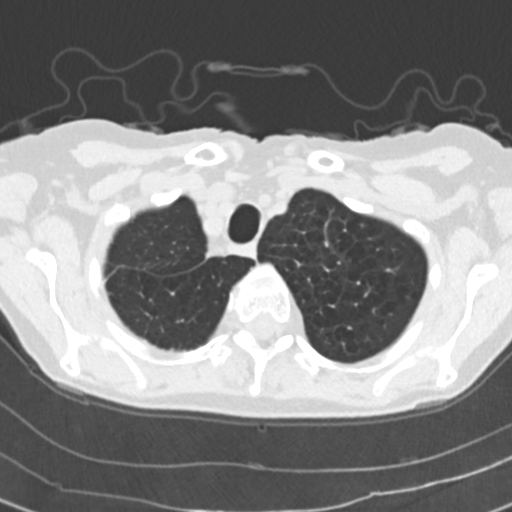
[im 132/143  lung]
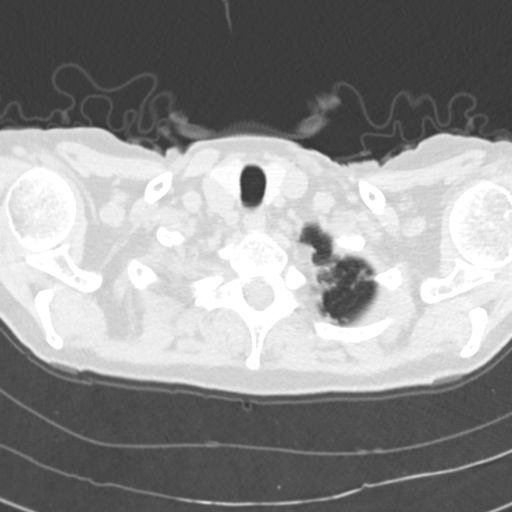

[15 of 34 positions shown; findings below may reference images not displayed]

FINDINGS: Cardiovascular: The heart size is normal. No substantial pericardial
effusion. Coronary artery calcification is evident. Mild
atherosclerotic calcification is noted in the wall of the thoracic
aorta.

Mediastinum/Nodes: No mediastinal lymphadenopathy. No evidence for
gross left hilar lymphadenopathy although assessment is limited by
the lack of intravenous contrast on the current study. Soft tissue
fullness in the right hilum is not well evaluated on today's
noncontrast study but overall appearance is similar to prior study.
The esophagus has normal imaging features. There is no axillary
lymphadenopathy.

Lungs/Pleura: Centrilobular and paraseptal emphysema evident. Volume
loss right hemithorax is stable consistent with prior remote lung
resection. Consolidative focal masslike opacity in the right lower
lobe is seen in the region of the pulmonary lesion noted on previous
CT of 06/03/2019 and today is consistent with post treatment
scarring, stable since 08/25/2020.

Previously characterized curvilinear subpleural nodule anterior
right lower lobe is similar today at 6 mm (84/2)

8 mm sub solid nodule posterior left lower lobe on 94/2 is stable.

There is no evidence of pleural effusion.

Upper Abdomen: Multiple renal cysts noted bilaterally.

Musculoskeletal: No worrisome lytic or sclerotic osseous
abnormality.
IMPRESSION: 1. Stable exam. No new or progressive interval findings.
2. Stable post treatment scarring in the right lower lobe.
3. Stable bilateral pulmonary nodules. Continued attention on
follow-up recommended.
4. Aortic Atherosclerosis (MD53Y-QQF.F) and Emphysema ([id number]-[id number].X).

## 2022-04-18 NOTE — Telephone Encounter (Signed)
Confirmed with patient the smc apt tomorrow at 130 and Lucianne Lei transportation pick up at 12 pm. Pt gave verbal understanding of the plan.  Per v/o Dr. Janese Banks - we will hold off on sending script for lomotil until pt is evaluated by Lauren tomorrow.

## 2022-04-18 NOTE — Telephone Encounter (Signed)
Per Dr. Janese Banks, patient needs evaluation in smc tomorrow for uncontrolled diarrhea.- pt needs script for lomotil and stool studies.

## 2022-04-19 ENCOUNTER — Inpatient Hospital Stay (HOSPITAL_BASED_OUTPATIENT_CLINIC_OR_DEPARTMENT_OTHER): Payer: Medicare Other | Admitting: Nurse Practitioner

## 2022-04-19 ENCOUNTER — Encounter: Payer: Self-pay | Admitting: Nurse Practitioner

## 2022-04-19 ENCOUNTER — Inpatient Hospital Stay: Payer: Medicare Other

## 2022-04-19 ENCOUNTER — Other Ambulatory Visit: Payer: Self-pay

## 2022-04-19 VITALS — BP 96/58 | HR 87 | Temp 98.6°F | Ht 60.0 in | Wt <= 1120 oz

## 2022-04-19 DIAGNOSIS — R197 Diarrhea, unspecified: Secondary | ICD-10-CM

## 2022-04-19 DIAGNOSIS — Z510 Encounter for antineoplastic radiation therapy: Secondary | ICD-10-CM | POA: Diagnosis not present

## 2022-04-19 DIAGNOSIS — C342 Malignant neoplasm of middle lobe, bronchus or lung: Secondary | ICD-10-CM

## 2022-04-19 LAB — CMP (CANCER CENTER ONLY)
ALT: 26 U/L (ref 0–44)
AST: 30 U/L (ref 15–41)
Albumin: 3.2 g/dL — ABNORMAL LOW (ref 3.5–5.0)
Alkaline Phosphatase: 87 U/L (ref 38–126)
Anion gap: 7 (ref 5–15)
BUN: 49 mg/dL — ABNORMAL HIGH (ref 8–23)
CO2: 21 mmol/L — ABNORMAL LOW (ref 22–32)
Calcium: 8.2 mg/dL — ABNORMAL LOW (ref 8.9–10.3)
Chloride: 104 mmol/L (ref 98–111)
Creatinine: 0.57 mg/dL (ref 0.44–1.00)
GFR, Estimated: >60 mL/min (ref >60)
Glucose, Bld: 98 mg/dL (ref 70–99)
Potassium: 4.3 mmol/L (ref 3.5–5.1)
Sodium: 132 mmol/L — ABNORMAL LOW (ref 135–145)
Total Bilirubin: 0.4 mg/dL (ref 0.3–1.2)
Total Protein: 5.8 g/dL — ABNORMAL LOW (ref 6.5–8.1)

## 2022-04-19 LAB — CBC WITH DIFFERENTIAL (CANCER CENTER ONLY)
Abs Immature Granulocytes: 0.05 10*3/uL (ref 0.00–0.07)
Basophils Absolute: 0.1 10*3/uL (ref 0.0–0.1)
Basophils Relative: 1 %
Eosinophils Absolute: 0 10*3/uL (ref 0.0–0.5)
Eosinophils Relative: 0 %
HCT: 35.4 % — ABNORMAL LOW (ref 36.0–46.0)
Hemoglobin: 11 g/dL — ABNORMAL LOW (ref 12.0–15.0)
Immature Granulocytes: 1 %
Lymphocytes Relative: 30 %
Lymphs Abs: 2.9 10*3/uL (ref 0.7–4.0)
MCH: 26.8 pg (ref 26.0–34.0)
MCHC: 31.1 g/dL (ref 30.0–36.0)
MCV: 86.1 fL (ref 80.0–100.0)
Monocytes Absolute: 1.4 10*3/uL — ABNORMAL HIGH (ref 0.1–1.0)
Monocytes Relative: 14 %
Neutro Abs: 5.3 10*3/uL (ref 1.7–7.7)
Neutrophils Relative %: 54 %
Platelet Count: 267 10*3/uL (ref 150–400)
RBC: 4.11 MIL/uL (ref 3.87–5.11)
RDW: 18.4 % — ABNORMAL HIGH (ref 11.5–15.5)
WBC Count: 9.7 10*3/uL (ref 4.0–10.5)
nRBC: 0 % (ref 0.0–0.2)

## 2022-04-19 LAB — MAGNESIUM: Magnesium: 1.7 mg/dL (ref 1.7–2.4)

## 2022-04-19 MED ORDER — SODIUM CHLORIDE 0.9% FLUSH
10.0000 mL | Freq: Once | INTRAVENOUS | Status: AC
  Administered 2022-04-19: 10 mL via INTRAVENOUS
  Filled 2022-04-19: qty 10

## 2022-04-19 MED ORDER — HEPARIN SOD (PORK) LOCK FLUSH 100 UNIT/ML IV SOLN
500.0000 [IU] | Freq: Once | INTRAVENOUS | Status: AC
  Administered 2022-04-19: 500 [IU] via INTRAVENOUS
  Filled 2022-04-19: qty 5

## 2022-04-19 MED ORDER — SODIUM CHLORIDE 0.9 % IV SOLN
INTRAVENOUS | Status: DC
  Filled 2022-04-19 (×2): qty 250

## 2022-04-19 NOTE — Progress Notes (Signed)
[facility]. [facility] [address] [place], [place] [address] [phone] (phone) [phone] (fax)  Patient Care Team: Albina Billet, MD as PCP - General (Internal Medicine) Marden Noble, MD (Internal Medicine) Bary Castilla, Forest Gleason, MD (General Surgery) Telford Nab, RN as Oncology Nurse Navigator Noreene Filbert, MD as Referring Physician (Radiation Oncology) Sindy Guadeloupe, MD as Consulting Physician (Oncology)   Name of the patient: Carol Shaffer  TA:[id number]  [birth year]   Date of visit: 04/19/22  Diagnosis- NSCLC  Chief complaint/ Reason for visit- diarrhea  Heme/Onc history:  Oncology History  Non-small cell cancer of middle lobe of right lung ([place])  06/29/2019 Initial Diagnosis   Non-small cell cancer of middle lobe of right lung ([place])   11/03/2021 Cancer Staging   Staging form: Lung, AJCC 8th Edition - Clinical stage from 11/03/2021: Stage IV (cT1b, cN2, cM1) - Signed by Sindy Guadeloupe, MD on 11/13/2021 Histopathologic type: Adenocarcinoma, NOS   11/27/2021 - 01/08/2022 Chemotherapy   Patient is on Treatment Plan : LUNG Carboplatin + Paclitaxel + XRT q7d     03/12/2022 -  Chemotherapy   Patient is on Treatment Plan : LUNG NSCLC Durvalumab (1500) q28d       Interval history- Patient is very pleasant 77 year old female with above history of lung cancer, currently receiving durvaluamb who [facility] for complaints of diarrhea. She has had symptoms for 1 week since her last treatment. She took lomotil and symptoms have mostly resolved but she'd like a refill. No abdominal pain, nausea, or vomiting. No fevers or chills. Estimates 3-7 episodes of watery diarrhea per day. No black or bloody stools  Review of systems- Review of Systems  Constitutional:  Positive for malaise/fatigue. Negative for chills, fever and weight  loss.  HENT:  Negative for hearing loss, nosebleeds, sore throat and tinnitus.   Eyes:  Negative for blurred vision and double vision.  Respiratory:  Negative for cough, hemoptysis, shortness of breath and wheezing.   Cardiovascular:  Negative for chest pain, palpitations and leg swelling.  Gastrointestinal:  Positive for diarrhea. Negative for abdominal pain, blood in stool, constipation, melena, nausea and vomiting.  Genitourinary:  Negative for dysuria and urgency.  Musculoskeletal:  Negative for back pain, falls, joint pain and myalgias.  Skin:  Negative for itching and rash.  Neurological:  Negative for dizziness, tingling, sensory change, loss of consciousness, weakness and headaches.  Endo/Heme/Allergies:  Negative for environmental allergies. Does not bruise/bleed easily.  Psychiatric/Behavioral:  Negative for depression. The patient is not nervous/anxious and does not have insomnia.     Current treatment- durvalumab  Allergies  Allergen Reactions   Hydromorphone Hcl Itching   Bentyl [Dicyclomine] Nausea And Vomiting   Biaxin [Clarithromycin] Other (See Comments)    hallucinations   Budesonide Other (See Comments)    Ulcer   Erythromycin Nausea And Vomiting   Reglan [Metoclopramide] Other (See Comments)    tremors    Past Medical History:  Diagnosis Date   Arthritis    hands   Avascular necrosis of hip, right (HCC)    Blepharospasm    Cancer ([place]) 2011   Right Upper Lobe Lobectomy   Chronic diarrhea    Chronic diarrhea    Collagenous colitis    Complication of anesthesia    usually wakes up during procedures  (endoscopy and colonoscopy)   COPD (chronic obstructive pulmonary  disease) ([place])    COVID 2022   Depression    Diverticulosis    Gastric outlet obstruction    Headache    every couple of days   Hemorrhoid    History of Crohn's disease    Hypoglycemic disorder    IDA (iron deficiency anemia)    Intestinal adhesions    Multiple gastric ulcers     Multiple gastric ulcers    Nausea and vomiting 07/03/2016   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[place])    Stroke ([place]) 8 yrs ago   double vision left eye    Past Surgical History:  Procedure Laterality Date   APPENDECTOMY  1989   botox injections for blepharospasm     BREAST SURGERY     CATARACT EXTRACTION     COLON RESECTION  2005   COLONOSCOPY  10-29-2008   [name]   COLONOSCOPY WITH PROPOFOL N/A 03/05/2017   Procedure: COLONOSCOPY WITH PROPOFOL;  Surgeon: Toledo, Benay Pike, MD;  Location: [facility] END[facility];  Service: Gastroenterology;  Laterality: N/A;   COLOSTOMY REVERSAL  2006   ESOPHAGOGASTRODUODENOSCOPY (EGD) WITH PROPOFOL N/A 07/18/2016   Procedure: ESOPHAGOGASTRODUODENOSCOPY (EGD) WITH PROPOFOL;  Surgeon: Robert Bellow, MD;  Location: [facility] END[facility];  Service: Endoscopy;  Laterality: N/A;   ESOPHAGOGASTRODUODENOSCOPY (EGD) WITH PROPOFOL N/A 08/03/2016   Procedure: ESOPHAGOGASTRODUODENOSCOPY (EGD) WITH PROPOFOL;  Surgeon: Robert Bellow, MD;  Location: [facility];  Service: Endoscopy;  Laterality: N/A;   ESOPHAGOGASTRODUODENOSCOPY (EGD) WITH PROPOFOL N/A 09/01/2018   Procedure: ESOPHAGOGASTRODUODENOSCOPY (EGD) WITH PROPOFOL;  Surgeon: Toledo, Benay Pike, MD;  Location: [facility];  Service: Gastroenterology;  Laterality: N/A;   ESOPHAGOGASTRODUODENOSCOPY (EGD) WITH PROPOFOL N/A 12/01/2018   Procedure: ESOPHAGOGASTRODUODENOSCOPY (EGD) WITH PROPOFOL;  Surgeon: Toledo, Benay Pike, MD;  Location: [facility];  Service: Gastroenterology;  Laterality: N/A;   ESOPHAGOGASTRODUODENOSCOPY (EGD) WITH PROPOFOL N/A 02/23/2019   Procedure: ESOPHAGOGASTRODUODENOSCOPY (EGD) WITH PROPOFOL;  Surgeon: Lucilla Lame, MD;  Location: [facility];  Service: Endoscopy;  Laterality: N/A;   ESOPHAGOSCOPY WITH DILITATION  2012,2015   [facility] [place]   EYE SURGERY     FLEXIBLE BRONCHOSCOPY N/A 10/30/2021   Procedure: FLEXIBLE BRONCHOSCOPY;  Surgeon: Armando Reichert, MD;  Location: [facility];   Service: Pulmonary;  Laterality: N/A;   GASTRECTOMY     gastric ulcer  1989, 1991   IR IMAGING GUIDED PORT INSERTION  11/14/2021   JOINT REPLACEMENT     right total hip arthroplasty 03/03/02   LAPAROSCOPIC LYSIS OF ADHESIONS     LUNG CANCER SURGERY  2011   LYSIS OF ADHESION     PLACEMENT OF BREAST IMPLANTS  1973   thoracotomy with right upper lobectomy and central compartment node dissection     UPPER GASTROINTESTINAL ENDOSCOPY  10-29-2008   [name]   VIDEO BRONCHOSCOPY WITH ENDOBRONCHIAL ULTRASOUND N/A 10/30/2021   Procedure: VIDEO BRONCHOSCOPY WITH ENDOBRONCHIAL ULTRASOUND;  Surgeon: Armando Reichert, MD;  Location: [facility];  Service: Pulmonary;  Laterality: N/A;    Social History   Socioeconomic History   Marital status: Widowed    Spouse name: Not on file   Number of children: 2   Years of education: Not on file   Highest education level: Not on file  Occupational History   Occupation: retired    Comment: Pharmacist, hospital  Tobacco Use   Smoking status: Former    Packs/day: 1.00    Years: 25.00    Additional pack years: 0.00    Total pack years: 25.00    Types: Cigarettes  Quit date: 01/30/2008    Years since quitting: 14.2   Smokeless tobacco: Never  Vaping Use   Vaping Use: Never used  Substance and Sexual Activity   Alcohol use: Yes    Comment: occ. for holiday   Drug use: No   Sexual activity: Not Currently  Other Topics Concern   Not on file  Social History Narrative   Lives alone    Social Determinants of Health   Financial Resource Strain: Not on file  Food Insecurity: No Food Insecurity (02/13/2022)   Hunger Vital Sign    Worried About Running Out of Food in the Last Year: Never true    Ran Out of Food in the Last Year: Never true  Transportation Needs: Unmet Transportation Needs (04/19/2022)   PRAPARE - Hydrologist (Medical): Yes    Lack of Transportation (Non-Medical): Yes  Physical Activity: Not on file  Stress: Not on file   Social Connections: Not on file  Intimate Partner Violence: Not At Risk (02/13/2022)   Humiliation, Afraid, Rape, and Kick questionnaire    Fear of Current or Ex-Partner: No    Emotionally Abused: No    Physically Abused: No    Sexually Abused: No    Family History  Problem Relation Age of Onset   Hypertension Mother    Diabetes Father     Current Outpatient Medications:    acetaminophen (TYLENOL) 500 MG tablet, Take 500 mg by mouth every 6 (six) hours as needed., Disp: , Rfl:    brimonidine-timolol (COMBIGAN) 0.2-0.5 % ophthalmic solution, INT 1 GTT IN OU BID, Disp: , Rfl:    Cholecalciferol (VITAMIN D3) 1000 units CAPS, Take 2,000 Units by mouth daily., Disp: , Rfl:    diltiazem (CARDIZEM) 60 MG tablet, Take 1 tablet (60 mg total) by mouth every 6 (six) hours., Disp: 180 tablet, Rfl: 0   fluticasone (FLONASE) 50 MCG/ACT nasal spray, Place 2 sprays into both nostrils daily., Disp: , Rfl:    HYDROcodone-acetaminophen (NORCO/VICODIN) 5-325 MG tablet, Take 1 tablet by mouth every 6 (six) hours as needed for moderate pain., Disp: 15 tablet, Rfl: 0   ipratropium-albuterol (DUONEB) 0.5-2.5 (3) MG/3ML SOLN, Take 3 mLs by nebulization every 6 (six) hours., Disp: 360 mL, Rfl: 0   loratadine (CLARITIN) 10 MG tablet, Take 10 mg by mouth daily., Disp: , Rfl:    magnesium chloride (SLOW-MAG) 64 MG TBEC SR tablet, Take 1 tablet (64 mg total) by mouth daily., Disp: 14 tablet, Rfl: 0   Multiple Vitamin (MULTI-VITAMINS) TABS, Take 1 tablet by mouth daily. once daily., Disp: , Rfl:    pantoprazole (PROTONIX) 40 MG tablet, Take 40 mg by mouth 2 (two) times daily., Disp: , Rfl:    potassium chloride (KLOR-CON M) 10 MEQ tablet, Take 1 tablet (10 mEq total) by mouth daily., Disp: 14 tablet, Rfl: 0   sucralfate (CARAFATE) 1 G tablet, Take 1 g by mouth 4 (four) times daily. , Disp: , Rfl:    traZODone (DESYREL) 150 MG tablet, Take 300 mg by mouth at bedtime. , Disp: , Rfl:    venlafaxine XR (EFFEXOR-XR) 150  MG 24 hr capsule, Take 150 mg by mouth daily with breakfast. , Disp: , Rfl:    docusate (COLACE) 50 MG/5ML liquid, Take by mouth daily. (Patient not taking: Reported on 04/19/2022), Disp: , Rfl:    magic mouthwash (nystatin, lidocaine, diphenhydrAMINE, alum & mag hydroxide) suspension, Swish and swallow 5 mLs 4 (four) times daily as needed  for mouth pain. (Patient not taking: Reported on 04/19/2022), Disp: 240 mL, Rfl: 1 No current facility-administered medications for this visit.  Facility-Administered Medications Ordered in Other Visits:    heparin lock flush 100 UNIT/ML injection, , , ,    heparin lock flush 100 unit/mL, 500 Units, Intravenous, Once, Verlon Au, NP  Physical exam:  Vitals:   04/19/22 1400  BP: (!) 96/58  Pulse: 87  Temp: 98.6 F (37 C)  TempSrc: Tympanic  SpO2: 91%  Weight: 68 lb 12.8 oz (31.2 kg)  Height: 5' (1.524 m)   Physical Exam Constitutional:      Appearance: She is not ill-appearing.  Cardiovascular:     Rate and Rhythm: Normal rate and regular rhythm.  Pulmonary:     Effort: No respiratory distress.     Breath sounds: No wheezing.  Abdominal:     General: There is no distension.     Tenderness: There is no abdominal tenderness. There is no guarding.  Skin:    General: Skin is warm and dry.     Coloration: Skin is not pale.  Neurological:     Mental Status: She is alert and oriented to person, place, and time.  Psychiatric:        Mood and Affect: Mood normal.        Behavior: Behavior normal.        Latest Ref Rng & Units 04/09/2022    9:09 AM  CMP  Glucose 70 - 99 mg/dL 121   BUN 8 - 23 mg/dL 27   Creatinine 0.44 - 1.00 mg/dL 0.61   Sodium 135 - 145 mmol/L 136   Potassium 3.5 - 5.1 mmol/L 4.0   Chloride 98 - 111 mmol/L 106   CO2 22 - 32 mmol/L 24   Calcium 8.9 - 10.3 mg/dL 8.2   Total Protein 6.5 - 8.1 g/dL 5.6   Total Bilirubin 0.3 - 1.2 mg/dL 0.4   Alkaline Phos 38 - 126 U/L 87   AST 15 - 41 U/L 27   ALT 0 - 44 U/L 24        Latest Ref Rng & Units 04/09/2022    9:09 AM  CBC  WBC 4.0 - 10.5 K/uL 6.9   Hemoglobin 12.0 - 15.0 g/dL 10.7   Hematocrit 36.0 - 46.0 % 35.1   Platelets 150 - 400 K/uL 284    No results found.  Assessment and plan- Patient is a 77 y.o. female diagnosed with NSCLC s/p concurrent chemo and radiation, now receiving maintenance durvalumab, cycle 2 on 04/09/22, who [facility] for   Diarrhea- etiology unclear- infectious vs immune mediated AE. Recommend IV fluids today and check stool studies and c diff. Hold off on anti-diarrheals until infection ruled out. If negative, refill lomotil. If ongoing symptoms, consider workup for colitis and steroids.   Disposition:  Follow up with Dr Janese Banks as scheduled or rtc sooner if symptoms don't improve or worsen.    Visit Diagnosis 1. Diarrhea, unspecified type    Patient expressed understanding and was in agreement with this plan. She also understands that She can call clinic at any time with any questions, concerns, or complaints.   Thank you for allowing me to participate in the care of this very pleasant patient.   Beckey Rutter, DNP, AGNP-C, [facility]

## 2022-04-20 ENCOUNTER — Inpatient Hospital Stay: Payer: Medicare Other

## 2022-04-20 VITALS — BP 104/69 | HR 96 | Temp 98.4°F | Resp 20

## 2022-04-20 DIAGNOSIS — C342 Malignant neoplasm of middle lobe, bronchus or lung: Secondary | ICD-10-CM

## 2022-04-20 DIAGNOSIS — Z510 Encounter for antineoplastic radiation therapy: Secondary | ICD-10-CM | POA: Diagnosis not present

## 2022-04-20 MED ORDER — HEPARIN SOD (PORK) LOCK FLUSH 100 UNIT/ML IV SOLN
500.0000 [IU] | Freq: Once | INTRAVENOUS | Status: AC
  Administered 2022-04-20: 500 [IU] via INTRAVENOUS
  Filled 2022-04-20: qty 5

## 2022-04-20 MED ORDER — SODIUM CHLORIDE 0.9 % IV SOLN
Freq: Once | INTRAVENOUS | Status: AC
  Filled 2022-04-20: qty 250

## 2022-04-20 MED ORDER — SODIUM CHLORIDE 0.9% FLUSH
10.0000 mL | Freq: Once | INTRAVENOUS | Status: AC
  Administered 2022-04-20: 10 mL via INTRAVENOUS
  Filled 2022-04-20: qty 10

## 2022-04-24 ENCOUNTER — Encounter: Payer: Self-pay | Admitting: Oncology

## 2022-04-25 ENCOUNTER — Other Ambulatory Visit: Payer: Self-pay | Admitting: Lab

## 2022-04-25 DIAGNOSIS — Z510 Encounter for antineoplastic radiation therapy: Secondary | ICD-10-CM | POA: Diagnosis not present

## 2022-04-25 DIAGNOSIS — R197 Diarrhea, unspecified: Secondary | ICD-10-CM

## 2022-04-25 LAB — C DIFFICILE QUICK SCREEN W PCR REFLEX
C Diff antigen: NEGATIVE
C Diff interpretation: NOT DETECTED
C Diff toxin: NEGATIVE

## 2022-04-27 ENCOUNTER — Telehealth: Payer: Self-pay | Admitting: Nurse Practitioner

## 2022-04-27 DIAGNOSIS — R197 Diarrhea, unspecified: Secondary | ICD-10-CM

## 2022-04-27 LAB — GI PATHOGEN PANEL BY PCR, STOOL

## 2022-04-27 MED ORDER — LOPERAMIDE HCL 2 MG PO CAPS: ORAL_CAPSULE | ORAL | 0 refills | Status: DC

## 2022-04-27 MED ORDER — DIPHENOXYLATE-ATROPINE 2.5-0.025 MG PO TABS: 2.0000 | ORAL_TABLET | Freq: Four times a day (QID) | ORAL | 0 refills | Status: DC | PRN

## 2022-04-27 NOTE — Telephone Encounter (Signed)
Notified patient that stool studies were negative for infection. Still having multiple episodes of diarrhea. She declines steroids for now d/t previous poor tolerance. Will trial lomotil and imodium over the weekend. If not better, consider steroids & workup for immunotherapy related diarrhea.

## 2022-05-01 ENCOUNTER — Other Ambulatory Visit: Payer: Self-pay

## 2022-05-04 ENCOUNTER — Other Ambulatory Visit: Payer: Self-pay

## 2022-05-07 ENCOUNTER — Other Ambulatory Visit: Payer: Self-pay | Admitting: *Deleted

## 2022-05-07 ENCOUNTER — Inpatient Hospital Stay (HOSPITAL_BASED_OUTPATIENT_CLINIC_OR_DEPARTMENT_OTHER): Payer: Medicare Other | Admitting: Oncology

## 2022-05-07 ENCOUNTER — Encounter: Payer: Self-pay | Admitting: Oncology

## 2022-05-07 ENCOUNTER — Inpatient Hospital Stay: Payer: Medicare Other

## 2022-05-07 ENCOUNTER — Inpatient Hospital Stay: Payer: Medicare Other | Attending: Oncology

## 2022-05-07 VITALS — BP 99/66 | HR 99 | Temp 98.1°F | Resp 16 | Wt <= 1120 oz

## 2022-05-07 DIAGNOSIS — C342 Malignant neoplasm of middle lobe, bronchus or lung: Secondary | ICD-10-CM

## 2022-05-07 DIAGNOSIS — R197 Diarrhea, unspecified: Secondary | ICD-10-CM | POA: Insufficient documentation

## 2022-05-07 DIAGNOSIS — Z923 Personal history of irradiation: Secondary | ICD-10-CM | POA: Diagnosis not present

## 2022-05-07 DIAGNOSIS — Z5112 Encounter for antineoplastic immunotherapy: Secondary | ICD-10-CM | POA: Diagnosis not present

## 2022-05-07 DIAGNOSIS — D801 Nonfamilial hypogammaglobulinemia: Secondary | ICD-10-CM | POA: Diagnosis present

## 2022-05-07 DIAGNOSIS — K529 Noninfective gastroenteritis and colitis, unspecified: Secondary | ICD-10-CM | POA: Diagnosis not present

## 2022-05-07 DIAGNOSIS — Z95828 Presence of other vascular implants and grafts: Secondary | ICD-10-CM

## 2022-05-07 DIAGNOSIS — J432 Centrilobular emphysema: Secondary | ICD-10-CM | POA: Insufficient documentation

## 2022-05-07 DIAGNOSIS — Z7952 Long term (current) use of systemic steroids: Secondary | ICD-10-CM | POA: Diagnosis not present

## 2022-05-07 DIAGNOSIS — Z87891 Personal history of nicotine dependence: Secondary | ICD-10-CM | POA: Diagnosis not present

## 2022-05-07 DIAGNOSIS — K5289 Other specified noninfective gastroenteritis and colitis: Secondary | ICD-10-CM | POA: Insufficient documentation

## 2022-05-07 DIAGNOSIS — Z79899 Other long term (current) drug therapy: Secondary | ICD-10-CM | POA: Diagnosis not present

## 2022-05-07 DIAGNOSIS — E876 Hypokalemia: Secondary | ICD-10-CM

## 2022-05-07 LAB — TSH: TSH: 3.683 u[IU]/mL (ref 0.350–4.500)

## 2022-05-07 LAB — CBC WITH DIFFERENTIAL/PLATELET
Abs Immature Granulocytes: 0.05 10*3/uL (ref 0.00–0.07)
Basophils Absolute: 0.1 10*3/uL (ref 0.0–0.1)
Basophils Relative: 1 %
Eosinophils Absolute: 0.1 10*3/uL (ref 0.0–0.5)
Eosinophils Relative: 1 %
HCT: 39.1 % (ref 36.0–46.0)
Hemoglobin: 12.1 g/dL (ref 12.0–15.0)
Immature Granulocytes: 1 %
Lymphocytes Relative: 37 %
Lymphs Abs: 3.2 10*3/uL (ref 0.7–4.0)
MCH: 26.4 pg (ref 26.0–34.0)
MCHC: 30.9 g/dL (ref 30.0–36.0)
MCV: 85.2 fL (ref 80.0–100.0)
Monocytes Absolute: 0.9 10*3/uL (ref 0.1–1.0)
Monocytes Relative: 11 %
Neutro Abs: 4.3 10*3/uL (ref 1.7–7.7)
Neutrophils Relative %: 49 %
Platelets: 284 10*3/uL (ref 150–400)
RBC: 4.59 MIL/uL (ref 3.87–5.11)
RDW: 19.2 % — ABNORMAL HIGH (ref 11.5–15.5)
WBC: 8.6 10*3/uL (ref 4.0–10.5)
nRBC: 0 % (ref 0.0–0.2)

## 2022-05-07 LAB — COMPREHENSIVE METABOLIC PANEL
ALT: 28 U/L (ref 0–44)
AST: 32 U/L (ref 15–41)
Albumin: 3 g/dL — ABNORMAL LOW (ref 3.5–5.0)
Alkaline Phosphatase: 85 U/L (ref 38–126)
Anion gap: 6 (ref 5–15)
BUN: 15 mg/dL (ref 8–23)
CO2: 24 mmol/L (ref 22–32)
Calcium: 8.3 mg/dL — ABNORMAL LOW (ref 8.9–10.3)
Chloride: 106 mmol/L (ref 98–111)
Creatinine, Ser: 0.69 mg/dL (ref 0.44–1.00)
GFR, Estimated: >60 mL/min (ref >60)
Glucose, Bld: 125 mg/dL — ABNORMAL HIGH (ref 70–99)
Potassium: 3.4 mmol/L — ABNORMAL LOW (ref 3.5–5.1)
Sodium: 136 mmol/L (ref 135–145)
Total Bilirubin: 0.4 mg/dL (ref 0.3–1.2)
Total Protein: 5.8 g/dL — ABNORMAL LOW (ref 6.5–8.1)

## 2022-05-07 MED ORDER — HEPARIN SOD (PORK) LOCK FLUSH 100 UNIT/ML IV SOLN
500.0000 [IU] | Freq: Once | INTRAVENOUS | Status: AC
  Administered 2022-05-07: 500 [IU] via INTRAVENOUS
  Filled 2022-05-07: qty 5

## 2022-05-07 MED ORDER — PREDNISONE 10 MG PO TABS: 30.0000 mg | ORAL_TABLET | Freq: Every day | ORAL | 0 refills | Status: DC

## 2022-05-07 MED ORDER — POTASSIUM CHLORIDE IN NACL 20-0.9 MEQ/L-% IV SOLN
Freq: Once | INTRAVENOUS | Status: AC
  Filled 2022-05-07: qty 1000

## 2022-05-07 NOTE — Progress Notes (Signed)
Pt in for follow up reports she is still having uncontrolled diarrhea is taking imodium and "prescription sent in last week".. pt was actually taking imodium which she thought was a "new med".  NP had actually sent in script for lomotil as well but pt did not get from [facility] and did not understand she was still taking imodium.  RN printed med list circled medication and explained difference in meds.  RN also called [facility] to verify lomotil was still available.

## 2022-05-07 NOTE — Progress Notes (Signed)
Hematology/Oncology Consult note [facility]  Telephone:([phone] [phone]  Patient Care Team: Jaclyn Shaggy, MD as PCP - General (Internal Medicine) Derwood Kaplan, MD (Internal Medicine) Lemar Livings, Merrily Pew, MD (General Surgery) Glory Buff, RN as Oncology Nurse Navigator Carmina Miller, MD as Referring Physician (Radiation Oncology) Creig Hines, MD as Consulting Physician (Oncology)   Name of the patient: Carol Shaffer  [id number]  April 18, 1945   Date of visit: 05/07/22  Diagnosis-  non-small cell lung cancer involving the right middle lobe stage IV T1b N2 M1       Chief complaint/ Reason for visit-on treatment assessment prior to cycle 3 of maintenance durvalumab  Heme/Onc history: Patient is a 77 year old female with a history oflung cancer in 2012 s/p right upper lobe resection.  Low dose chest CT on 09/04/2018 revealed multiple small pulmonary nodules are noted in the lungs bilaterally. In addition, there was a 1.53 cm concerning nodule in the right lower lobe. There was diffuse bronchial wall thickening with mild centrilobular and paraseptal emphysema; imaging findings suggestive of underlying COPD.PET scan on 09/16/2018 revealed low level FDG uptake associated with the right lower lobe lung nodule (SUV 1.57). This was a nonspecific finding. Given the size of this nodule and the low level uptake, findings may reflect inflammatory or infectious nodule. Malignancy was not entirely excluded.  Low dose chest CT on 12/04/2018 revealed a 15.3 mm RLL nodule unchanged in size.   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  There were some ground-glass attenuation components, but is predominantly solid in appearance with some internal air bronchograms, with macrolobulated and spiculated borders, clearly  increased in size.   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  Tumor was positive for CK7 and TTF-1 and negative for p40.  She is not a surgical candidate.   She received 6000 cGy SBRT to the RLL from 08/05/2019 - 08/17/2019.   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  History of iron deficiency anemia and she has undergone work-up in the past.  She has a history of gastric bypass surgery.    Patient hadCT scan of breast 2023 which showed narrowing and occlusion of the right middle lobe bronchus which was looking more prominent as compared to prior scans.  There was also concern for paratracheal adenopathy which had increased in size as compared to prior scans.  Patient was seen by pulmonary and underwent bronchoscopy guided biopsy.  Bronchial biopsies of the right middle lobe did not show any malignancy however cytology from station 7 and station 4R lymph node was consistent with adenocarcinoma.  PET scan also showed hypermetabolism in the T2 vertebral body lesion concerning for metastatic disease   Patient completed concurrent chemoradiation with a curative intent.  Repeat scan showed partial response and plan is to proceed with maintenance durvalumab.  There was not adequate sample to perform NGS testing.  PD-L1 less than 1%.  Interval history-patient has been having diarrhea for the last 10 days.  She has not picked up her Lomotil prescription yet and says that Imodium has not been helping.  She is having about 4-5 loose bowel movements per day and is having to wear incontinence pads.  She has lost 4 pounds in the last 1 week.  Denies any significant abdominal pain  or blood in stools.  She is still able to eat and drink normally.  ECOG PS- 2 Pain scale- 0 Opioid associated constipation- no  Review of systems- Review of Systems  Constitutional:  Negative for chills, fever, malaise/fatigue and weight  loss.  HENT:  Negative for congestion, ear discharge and nosebleeds.   Eyes:  Negative for blurred vision.  Respiratory:  Negative for cough, hemoptysis, sputum production, shortness of breath and wheezing.   Cardiovascular:  Negative for chest pain, palpitations, orthopnea and claudication.  Gastrointestinal:  Positive for diarrhea. Negative for abdominal pain, blood in stool, constipation, heartburn, melena, nausea and vomiting.  Genitourinary:  Negative for dysuria, flank pain, frequency, hematuria and urgency.  Musculoskeletal:  Negative for back pain, joint pain and myalgias.  Skin:  Negative for rash.  Neurological:  Negative for dizziness, tingling, focal weakness, seizures, weakness and headaches.  Endo/Heme/Allergies:  Does not bruise/bleed easily.  Psychiatric/Behavioral:  Negative for depression and suicidal ideas. The patient does not have insomnia.       Allergies  Allergen Reactions   Hydromorphone Hcl Itching   Bentyl [Dicyclomine] Nausea And Vomiting   Biaxin [Clarithromycin] Other (See Comments)    hallucinations   Budesonide Other (See Comments)    Ulcer   Erythromycin Nausea And Vomiting   Reglan [Metoclopramide] Other (See Comments)    tremors     Past Medical History:  Diagnosis Date   Arthritis    hands   Avascular necrosis of hip, right    Blepharospasm    Cancer 2011   Right Upper Lobe Lobectomy   Chronic diarrhea    Chronic diarrhea    Collagenous colitis    Complication of anesthesia    usually wakes up during procedures  (endoscopy and colonoscopy)   COPD (chronic obstructive pulmonary disease)    COVID 2022   Depression    Diverticulosis    Gastric outlet obstruction    Headache    every couple of days   Hemorrhoid    History of Crohn's disease    Hypoglycemic disorder    IDA (iron deficiency anemia)    Intestinal adhesions    Multiple gastric ulcers    Multiple gastric ulcers    Nausea and vomiting 07/03/2016   Stenosis of  gastrointestinal structure    Stroke 8 yrs ago   double vision left eye     Past Surgical History:  Procedure Laterality Date   APPENDECTOMY  1989   botox injections for blepharospasm     BREAST SURGERY     CATARACT EXTRACTION     COLON RESECTION  2005   COLONOSCOPY  10-29-2008   Dr Lemar Livings   COLONOSCOPY WITH PROPOFOL N/A 03/05/2017   Procedure: COLONOSCOPY WITH PROPOFOL;  Surgeon: Toledo, Boykin Nearing, MD;  Location: [facility];  Service: Gastroenterology;  Laterality: N/A;   COLOSTOMY REVERSAL  2006   ESOPHAGOGASTRODUODENOSCOPY (EGD) WITH PROPOFOL N/A 07/18/2016   Procedure: ESOPHAGOGASTRODUODENOSCOPY (EGD) WITH PROPOFOL;  Surgeon: Earline Mayotte, MD;  Location: [facility];  Service: Endoscopy;  Laterality: N/A;   ESOPHAGOGASTRODUODENOSCOPY (EGD) WITH PROPOFOL N/A 08/03/2016   Procedure: ESOPHAGOGASTRODUODENOSCOPY (EGD) WITH PROPOFOL;  Surgeon: Earline Mayotte, MD;  Location: [facility];  Service: Endoscopy;  Laterality: N/A;   ESOPHAGOGASTRODUODENOSCOPY (EGD) WITH PROPOFOL N/A 09/01/2018   Procedure: ESOPHAGOGASTRODUODENOSCOPY (EGD) WITH PROPOFOL;  Surgeon: Toledo, Boykin Nearing, MD;  Location: [facility];  Service: Gastroenterology;  Laterality: N/A;   ESOPHAGOGASTRODUODENOSCOPY (EGD) WITH PROPOFOL N/A 12/01/2018   Procedure: ESOPHAGOGASTRODUODENOSCOPY (EGD) WITH PROPOFOL;  Surgeon:  Toledo, Boykin Nearingeodoro K, MD;  Location: [facility];  Service: Gastroenterology;  Laterality: N/A;   ESOPHAGOGASTRODUODENOSCOPY (EGD) WITH PROPOFOL N/A 02/23/2019   Procedure: ESOPHAGOGASTRODUODENOSCOPY (EGD) WITH PROPOFOL;  Surgeon: Midge MiniumWohl, Darren, MD;  [facility];  Service: Endoscopy;  Laterality: N/A;   ESOPHAGOSCOPY WITH DILITATION  2012,2015   [facility]   EYE SURGERY     FLEXIBLE BRONCHOSCOPY N/A 10/30/2021   Procedure: FLEXIBLE BRONCHOSCOPY;  Surgeon: Raechel Chutegayli, Khabib, MD;  Location: [facility];  Service: Pulmonary;  Laterality: N/A;   GASTRECTOMY     gastric ulcer  1989,  1991   IR IMAGING GUIDED PORT INSERTION  11/14/2021   JOINT REPLACEMENT     right total hip arthroplasty 03/03/02   LAPAROSCOPIC LYSIS OF ADHESIONS     LUNG CANCER SURGERY  2011   LYSIS OF ADHESION     PLACEMENT OF BREAST IMPLANTS  1973   thoracotomy with right upper lobectomy and central compartment node dissection     UPPER GASTROINTESTINAL ENDOSCOPY  10-29-2008   Dr Lemar LivingsByrnett   VIDEO BRONCHOSCOPY WITH ENDOBRONCHIAL ULTRASOUND N/A 10/30/2021   Procedure: VIDEO BRONCHOSCOPY WITH ENDOBRONCHIAL ULTRASOUND;  Surgeon: Raechel Chutegayli, Khabib, MD;  Location: [facility];  Service: Pulmonary;  Laterality: N/A;    Social History   Socioeconomic History   Marital status: Widowed    Spouse name: Not on file   Number of children: 2   Years of education: Not on file   Highest education level: Not on file  Occupational History   Occupation: retired    Comment: Runner, broadcasting/film/videoteacher  Tobacco Use   Smoking status: Former    Packs/day: 1.00    Years: 25.00    Additional pack years: 0.00    Total pack years: 25.00    Types: Cigarettes    Quit date: 01/30/2008    Years since quitting: 14.2   Smokeless tobacco: Never  Vaping Use   Vaping Use: Never used  Substance and Sexual Activity   Alcohol use: Yes    Comment: occ. for holiday   Drug use: No   Sexual activity: Not Currently  Other Topics Concern   Not on file  Social History Narrative   Lives alone    Social Determinants of Health   Financial Resource Strain: Not on file  Food Insecurity: No Food Insecurity (02/13/2022)   Hunger Vital Sign    Worried About Running Out of Food in the Last Year: Never true    Ran Out of Food in the Last Year: Never true  Transportation Needs: Unmet Transportation Needs (05/07/2022)   PRAPARE - Administrator, Civil ServiceTransportation    Lack of Transportation (Medical): Yes    Lack of Transportation (Non-Medical): Yes  Physical Activity: Not on file  Stress: Not on file  Social Connections: Not on file  Intimate Partner Violence: Not At Risk  (02/13/2022)   Humiliation, Afraid, Rape, and Kick questionnaire    Fear of Current or Ex-Partner: No    Emotionally Abused: No    Physically Abused: No    Sexually Abused: No    Family History  Problem Relation Age of Onset   Hypertension Mother    Diabetes Father      Current Outpatient Medications:    acetaminophen (TYLENOL) 500 MG tablet, Take 500 mg by mouth every 6 (six) hours as needed., Disp: , Rfl:    brimonidine-timolol (COMBIGAN) 0.2-0.5 % ophthalmic solution, INT 1 GTT IN OU BID, Disp: , Rfl:    Cholecalciferol (VITAMIN D3) 1000 units CAPS, Take 2,000 Units by  mouth daily., Disp: , Rfl:    diltiazem (CARDIZEM) 60 MG tablet, Take 1 tablet (60 mg total) by mouth every 6 (six) hours., Disp: 180 tablet, Rfl: 0   diphenoxylate-atropine (LOMOTIL) 2.5-0.025 MG tablet, Take 2 tablets by mouth 4 (four) times daily as needed for diarrhea or loose stools., Disp: 120 tablet, Rfl: 0   fluticasone (FLONASE) 50 MCG/ACT nasal spray, Place 2 sprays into both nostrils daily., Disp: , Rfl:    HYDROcodone-acetaminophen (NORCO/VICODIN) 5-325 MG tablet, Take 1 tablet by mouth every 6 (six) hours as needed for moderate pain., Disp: 15 tablet, Rfl: 0   ipratropium-albuterol (DUONEB) 0.5-2.5 (3) MG/3ML SOLN, Take 3 mLs by nebulization every 6 (six) hours., Disp: 360 mL, Rfl: 0   loperamide (IMODIUM) 2 MG capsule, Take 2 capsules (4 mg) at first loose stool then 1 capsule (2 mg) with each subsequent loose stool. Do not exceed 8 capsules (16 mg) in 24 hour period., Disp: 30 capsule, Rfl: 0   loratadine (CLARITIN) 10 MG tablet, Take 10 mg by mouth daily., Disp: , Rfl:    magnesium chloride (SLOW-MAG) 64 MG TBEC SR tablet, Take 1 tablet (64 mg total) by mouth daily., Disp: 14 tablet, Rfl: 0   Multiple Vitamin (MULTI-VITAMINS) TABS, Take 1 tablet by mouth daily. once daily., Disp: , Rfl:    pantoprazole (PROTONIX) 40 MG tablet, Take 40 mg by mouth 2 (two) times daily., Disp: , Rfl:    potassium chloride  (KLOR-CON M) 10 MEQ tablet, Take 1 tablet (10 mEq total) by mouth daily., Disp: 14 tablet, Rfl: 0   sucralfate (CARAFATE) 1 G tablet, Take 1 g by mouth 4 (four) times daily. , Disp: , Rfl:    traZODone (DESYREL) 150 MG tablet, Take 300 mg by mouth at bedtime. , Disp: , Rfl:    venlafaxine XR (EFFEXOR-XR) 150 MG 24 hr capsule, Take 150 mg by mouth daily with breakfast. , Disp: , Rfl:    docusate (COLACE) 50 MG/5ML liquid, Take by mouth daily. (Patient not taking: Reported on 05/07/2022), Disp: , Rfl:    magic mouthwash (nystatin, lidocaine, diphenhydrAMINE, alum & mag hydroxide) suspension, Swish and swallow 5 mLs 4 (four) times daily as needed for mouth pain. (Patient not taking: Reported on 04/19/2022), Disp: 240 mL, Rfl: 1 No current facility-administered medications for this visit.  Facility-Administered Medications Ordered in Other Visits:    heparin lock flush 100 UNIT/ML injection, , , ,   Physical exam:  Vitals:   05/07/22 0944  BP: 99/66  Pulse: 99  Resp: 16  Temp: 98.1 F (36.7 C)  TempSrc: Tympanic  SpO2: 94%  Weight: 64 lb 9.6 oz (29.3 kg)   Physical Exam Cardiovascular:     Rate and Rhythm: Normal rate and regular rhythm.     Heart sounds: Normal heart sounds.  Pulmonary:     Effort: Pulmonary effort is normal.     Breath sounds: Normal breath sounds.  Abdominal:     General: Bowel sounds are normal. There is no distension.     Palpations: Abdomen is soft.     Tenderness: There is no abdominal tenderness.  Skin:    General: Skin is warm and dry.  Neurological:     Mental Status: She is alert and oriented to person, place, and time.         Latest Ref Rng & Units 05/07/2022    9:09 AM  CMP  Glucose 70 - 99 mg/dL 098   BUN 8 - 23 mg/dL 15  Creatinine 0.44 - 1.00 mg/dL 9.60   Sodium 454 - 098 mmol/L 136   Potassium 3.5 - 5.1 mmol/L 3.4   Chloride 98 - 111 mmol/L 106   CO2 22 - 32 mmol/L 24   Calcium 8.9 - 10.3 mg/dL 8.3   Total Protein 6.5 - 8.1 g/dL 5.8    Total Bilirubin 0.3 - 1.2 mg/dL 0.4   Alkaline Phos 38 - 126 U/L 85   AST 15 - 41 U/L 32   ALT 0 - 44 U/L 28       Latest Ref Rng & Units 05/07/2022    9:09 AM  CBC  WBC 4.0 - 10.5 K/uL 8.6   Hemoglobin 12.0 - 15.0 g/dL 11.9   Hematocrit 14.7 - 46.0 % 39.1   Platelets 150 - 400 K/uL 284     Assessment and plan- Patient is a 77 y.o. female with history of non-small cell lung cancer involving the right middle lobe stage IV T1b N2 M1.  She is s/p concurrent chemoradiation with weekly CarboTaxol.  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  Her stool studies have been negativeFor C. difficile and other infections.  Imodium has not helped.  I have therefore reached out to Dr. Servando Snare to set up a flexible sigmoidoscopy with biopsies for her.  I am holding off on giving her durvalumab today and I will be starting her on prednisone 1 mg/kg for the next 10 days.  I will see her back in 1 week and see how she is doing.  Patient only weighs 67 pounds and therefore will be getting 30 mg of prednisone along with pantoprazole.  Will plan to give her 1 L of IV fluids today with 20 mEq of IV potassium and I will bring her back next week for labs and possible fluids.  Patient will need repeat surveillance scans 3 weeks from now   Visit Diagnosis 1. Encounter for antineoplastic immunotherapy   2. Non-small cell cancer of middle lobe of right lung   3. Colitis      Dr. Owens Shark, MD, MPH [facility] at [facility] [phone] 05/07/2022 9:46 AM

## 2022-05-08 ENCOUNTER — Other Ambulatory Visit: Payer: Self-pay

## 2022-05-09 ENCOUNTER — Other Ambulatory Visit: Payer: Self-pay

## 2022-05-09 LAB — T4: T4, Total: 8.9 ug/dL (ref 4.5–12.0)

## 2022-05-11 ENCOUNTER — Inpatient Hospital Stay: Payer: Medicare Other

## 2022-05-11 NOTE — Progress Notes (Signed)
Nutrition Follow-up:  Patient identified on Malnutrition Screening report for weight loss  Patient with non small cell lung cancer, stage IV.  Patient has completed concurrent chemotherapy and radiation.  Was started on durvalumab, currently on hold due to diarrhea  Spoke with patient via phone.  Reports that she has lost weight due to 22 days of having diarrhea.  Reports that she has been taking the lomotil and predinsone and diarrhea resolved about 3 days after starting medication.  She says that she has been able to eat and gained 4 lbs according to home scale.  States that today she has eaten cheerios, peanut butter crackers, rice with butter, stromboli with cheese mushrooms and black olives, and apple juice.  She says that she has been drinking Fairlife shakes 3 a day.     Medications: lomotil, predinsone  Labs: K 3.4, glucose 125  Anthropometrics:   Weight 64 lb 9.6 oz on 4/8  Reports weight of 65 lb on home scale 71 lb on 12/4 Most she has ever weighed was 93 lb   NUTRITION DIAGNOSIS: Inadequate oral intake improving   INTERVENTION:  Now that diarrhea has resolved encouraged high calorie, high protein foods in diet. Patient is familiar with strategies as we have discussed in the past  Continue Fairlife shakes at least 3 per day.  Unable to tolerate other shakes Patient has RD contact information   MONITORING, EVALUATION, GOAL: weight trends, intake   NEXT VISIT: as needed  Guilherme Schwenke B. Freida Busman, RD, LDN Registered Dietitian [phone]

## 2022-05-15 ENCOUNTER — Encounter: Payer: Self-pay | Admitting: *Deleted

## 2022-05-15 ENCOUNTER — Other Ambulatory Visit: Payer: Self-pay | Admitting: *Deleted

## 2022-05-15 ENCOUNTER — Inpatient Hospital Stay: Payer: Medicare Other

## 2022-05-15 ENCOUNTER — Inpatient Hospital Stay (HOSPITAL_BASED_OUTPATIENT_CLINIC_OR_DEPARTMENT_OTHER): Payer: Medicare Other | Admitting: Oncology

## 2022-05-15 ENCOUNTER — Encounter: Payer: Self-pay | Admitting: Oncology

## 2022-05-15 ENCOUNTER — Telehealth: Payer: Self-pay

## 2022-05-15 VITALS — BP 118/68 | HR 93 | Temp 97.2°F | Resp 16 | Ht 60.0 in | Wt <= 1120 oz

## 2022-05-15 DIAGNOSIS — C342 Malignant neoplasm of middle lobe, bronchus or lung: Secondary | ICD-10-CM | POA: Diagnosis not present

## 2022-05-15 DIAGNOSIS — K529 Noninfective gastroenteritis and colitis, unspecified: Secondary | ICD-10-CM

## 2022-05-15 DIAGNOSIS — E860 Dehydration: Secondary | ICD-10-CM

## 2022-05-15 LAB — BASIC METABOLIC PANEL
Anion gap: 8 (ref 5–15)
BUN: 45 mg/dL — ABNORMAL HIGH (ref 8–23)
CO2: 24 mmol/L (ref 22–32)
Calcium: 8.1 mg/dL — ABNORMAL LOW (ref 8.9–10.3)
Chloride: 104 mmol/L (ref 98–111)
Creatinine, Ser: 0.53 mg/dL (ref 0.44–1.00)
GFR, Estimated: >60 mL/min (ref >60)
Glucose, Bld: 151 mg/dL — ABNORMAL HIGH (ref 70–99)
Potassium: 4.3 mmol/L (ref 3.5–5.1)
Sodium: 136 mmol/L (ref 135–145)

## 2022-05-15 LAB — MAGNESIUM: Magnesium: 1.7 mg/dL (ref 1.7–2.4)

## 2022-05-15 MED ORDER — IPRATROPIUM-ALBUTEROL 20-100 MCG/ACT IN AERS: 1.0000 | INHALATION_SPRAY | Freq: Four times a day (QID) | RESPIRATORY_TRACT | 1 refills | Status: DC

## 2022-05-15 MED ORDER — SODIUM CHLORIDE 0.9 % IV SOLN
INTRAVENOUS | Status: DC
  Filled 2022-05-15 (×2): qty 250

## 2022-05-15 MED ORDER — SODIUM CHLORIDE 0.9% FLUSH
10.0000 mL | Freq: Once | INTRAVENOUS | Status: AC
  Administered 2022-05-15: 10 mL via INTRAVENOUS
  Filled 2022-05-15: qty 10

## 2022-05-15 MED ORDER — HEPARIN SOD (PORK) LOCK FLUSH 100 UNIT/ML IV SOLN
500.0000 [IU] | Freq: Once | INTRAVENOUS | Status: AC
  Administered 2022-05-15: 500 [IU] via INTRAVENOUS
  Filled 2022-05-15: qty 5

## 2022-05-15 NOTE — Telephone Encounter (Signed)
Called pt to schedule flex sig for diarrhea, pt requested I call back after 3pm

## 2022-05-15 NOTE — Progress Notes (Signed)
Hematology/Oncology Consult note [facility]  Telephone:([phone] [phone]  Patient Care Team: Jaclyn Shaggy, MD as PCP - General (Internal Medicine) Derwood Kaplan, MD (Internal Medicine) Lemar Livings, Merrily Pew, MD (General Surgery) Glory Buff, RN as Oncology Nurse Navigator Carmina Miller, MD as Referring Physician (Radiation Oncology) Creig Hines, MD as Consulting Physician (Oncology)   Name of the patient: Carol Shaffer  [id number]  08-28-1945   Date of visit: 05/15/22  Diagnosis- non-small cell lung cancer involving the right middle lobe stage IV T1b N2 M1       Chief complaint/ Reason for visit- routine f/u of diarrhea  Heme/Onc history:  Patient is a 77 year old female with a history oflung cancer in 2012 s/p right upper lobe resection.  Low dose chest CT on 09/04/2018 revealed multiple small pulmonary nodules are noted in the lungs bilaterally. In addition, there was a 1.53 cm concerning nodule in the right lower lobe. There was diffuse bronchial wall thickening with mild centrilobular and paraseptal emphysema; imaging findings suggestive of underlying COPD.PET scan on 09/16/2018 revealed low level FDG uptake associated with the right lower lobe lung nodule (SUV 1.57). This was a nonspecific finding. Given the size of this nodule and the low level uptake, findings may reflect inflammatory or infectious nodule. Malignancy was not entirely excluded.  Low dose chest CT on 12/04/2018 revealed a 15.3 mm RLL nodule unchanged in size.   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  There were some ground-glass attenuation components, but is predominantly solid in appearance with some internal air bronchograms, with macrolobulated and spiculated borders, clearly increased in size.   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  Tumor was positive for CK7 and TTF-1 and negative for p40.  She is not a surgical candidate.   She received 6000 cGy SBRT to the RLL from 08/05/2019 - 08/17/2019.   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  History of iron deficiency anemia and she has undergone work-up in the past.  She has a history of gastric bypass surgery.    Patient hadCT scan of breast 2023 which showed narrowing and occlusion of the right middle lobe bronchus which was looking more prominent as compared to prior scans.  There was also concern for paratracheal adenopathy which had increased in size as compared to prior scans.  Patient was seen by pulmonary and underwent bronchoscopy guided biopsy.  Bronchial biopsies of the right middle lobe did not show any malignancy however cytology from station 7 and station 4R lymph node was consistent with adenocarcinoma.  PET scan also showed hypermetabolism in the T2 vertebral body lesion concerning for metastatic disease   Patient completed concurrent chemoradiation with a curative intent.  Repeat scan showed partial response and plan is to proceed with maintenance durvalumab.  There was not adequate sample to perform NGS testing.  PD-L1 less than 1%.  Interval history-after starting prednisone 30 mg last week her diarrhea has improved significantly.  Her last diarrhea was about 3 days ago.  Her appetite is better and she is able to keep food and drinks down.  ECOG PS- 2 Pain scale- 0  Review of systems- Review of Systems  Constitutional:  Positive for malaise/fatigue. Negative for chills, fever and weight loss.  HENT:  Negative for  congestion, ear discharge and nosebleeds.   Eyes:  Negative for blurred vision.  Respiratory:  Negative for cough, hemoptysis, sputum production, shortness of breath and wheezing.   Cardiovascular:  Negative for chest pain, palpitations,  orthopnea and claudication.  Gastrointestinal:  Negative for abdominal pain, blood in stool, constipation, diarrhea, heartburn, melena, nausea and vomiting.  Genitourinary:  Negative for dysuria, flank pain, frequency, hematuria and urgency.  Musculoskeletal:  Negative for back pain, joint pain and myalgias.  Skin:  Negative for rash.  Neurological:  Negative for dizziness, tingling, focal weakness, seizures, weakness and headaches.  Endo/Heme/Allergies:  Does not bruise/bleed easily.  Psychiatric/Behavioral:  Negative for depression and suicidal ideas. The patient does not have insomnia.       Allergies  Allergen Reactions   Hydromorphone Hcl Itching   Bentyl [Dicyclomine] Nausea And Vomiting   Biaxin [Clarithromycin] Other (See Comments)    hallucinations   Budesonide Other (See Comments)    Ulcer   Erythromycin Nausea And Vomiting   Reglan [Metoclopramide] Other (See Comments)    tremors     Past Medical History:  Diagnosis Date   Arthritis    hands   Avascular necrosis of hip, right    Blepharospasm    Cancer 2011   Right Upper Lobe Lobectomy   Chronic diarrhea    Chronic diarrhea    Collagenous colitis    Complication of anesthesia    usually wakes up during procedures  (endoscopy and colonoscopy)   COPD (chronic obstructive pulmonary disease)    COVID 2022   Depression    Diverticulosis    Gastric outlet obstruction    Headache    every couple of days   Hemorrhoid    History of Crohn's disease    Hypoglycemic disorder    IDA (iron deficiency anemia)    Intestinal adhesions    Multiple gastric ulcers    Multiple gastric ulcers    Nausea and vomiting 07/03/2016   Stenosis of gastrointestinal structure    Stroke 8 yrs ago   double vision left eye     Past Surgical History:  Procedure Laterality Date   APPENDECTOMY  1989   botox injections for blepharospasm     BREAST SURGERY     CATARACT EXTRACTION     COLON RESECTION  2005   COLONOSCOPY  10-29-2008    Dr Lemar Livings   COLONOSCOPY WITH PROPOFOL N/A 03/05/2017   Procedure: COLONOSCOPY WITH PROPOFOL;  Surgeon: Toledo, Boykin Nearing, MD;  Location: [facility];  Service: Gastroenterology;  Laterality: N/A;   COLOSTOMY REVERSAL  2006   ESOPHAGOGASTRODUODENOSCOPY (EGD) WITH PROPOFOL N/A 07/18/2016   Procedure: ESOPHAGOGASTRODUODENOSCOPY (EGD) WITH PROPOFOL;  Surgeon: Earline Mayotte, MD;  Location: [facility];  Service: Endoscopy;  Laterality: N/A;   ESOPHAGOGASTRODUODENOSCOPY (EGD) WITH PROPOFOL N/A 08/03/2016   Procedure: ESOPHAGOGASTRODUODENOSCOPY (EGD) WITH PROPOFOL;  Surgeon: Earline Mayotte, MD;  Location: [facility];  Service: Endoscopy;  Laterality: N/A;   ESOPHAGOGASTRODUODENOSCOPY (EGD) WITH PROPOFOL N/A 09/01/2018   Procedure: ESOPHAGOGASTRODUODENOSCOPY (EGD) WITH PROPOFOL;  Surgeon: Toledo, Boykin Nearing, MD;  Location: [facility];  Service: Gastroenterology;  Laterality: N/A;   ESOPHAGOGASTRODUODENOSCOPY (EGD) WITH PROPOFOL N/A 12/01/2018   Procedure: ESOPHAGOGASTRODUODENOSCOPY (EGD) WITH PROPOFOL;  Surgeon: Toledo, Boykin Nearing, MD;  Location: [facility];  Service: Gastroenterology;  Laterality: N/A;   ESOPHAGOGASTRODUODENOSCOPY (EGD) WITH PROPOFOL N/A 02/23/2019   Procedure: ESOPHAGOGASTRODUODENOSCOPY (EGD) WITH PROPOFOL;  Surgeon: Midge Minium, MD;  Location: [facility];  Service: Endoscopy;  Laterality: N/A;   ESOPHAGOSCOPY WITH DILITATION  0981,[id number]   Henry Russel   EYE SURGERY     FLEXIBLE BRONCHOSCOPY N/A 10/30/2021   Procedure: FLEXIBLE BRONCHOSCOPY;  Surgeon: Raechel Chute, MD;  Location: [facility];  Service: Pulmonary;  Laterality: N/A;   GASTRECTOMY     gastric ulcer  1989, 1991   IR IMAGING GUIDED PORT INSERTION  11/14/2021   JOINT REPLACEMENT     right total hip arthroplasty 03/03/02   LAPAROSCOPIC LYSIS OF ADHESIONS     LUNG CANCER SURGERY  2011   LYSIS OF ADHESION     PLACEMENT OF BREAST IMPLANTS  1973   thoracotomy with right upper lobectomy and  central compartment node dissection     UPPER GASTROINTESTINAL ENDOSCOPY  10-29-2008   Dr Lemar Livings   VIDEO BRONCHOSCOPY WITH ENDOBRONCHIAL ULTRASOUND N/A 10/30/2021   Procedure: VIDEO BRONCHOSCOPY WITH ENDOBRONCHIAL ULTRASOUND;  Surgeon: Raechel Chute, MD;  Location: [facility];  Service: Pulmonary;  Laterality: N/A;    Social History   Socioeconomic History   Marital status: Widowed    Spouse name: Not on file   Number of children: 2   Years of education: Not on file   Highest education level: Not on file  Occupational History   Occupation: retired    Comment: Runner, broadcasting/film/video  Tobacco Use   Smoking status: Former    Packs/day: 1.00    Years: 25.00    Additional pack years: 0.00    Total pack years: 25.00    Types: Cigarettes    Quit date: 01/30/2008    Years since quitting: 14.2   Smokeless tobacco: Never  Vaping Use   Vaping Use: Never used  Substance and Sexual Activity   Alcohol use: Yes    Comment: occ. for holiday   Drug use: No   Sexual activity: Not Currently  Other Topics Concern   Not on file  Social History Narrative   Lives alone    Social Determinants of Health   Financial Resource Strain: Not on file  Food Insecurity: No Food Insecurity (02/13/2022)   Hunger Vital Sign    Worried About Running Out of Food in the Last Year: Never true    Ran Out of Food in the Last Year: Never true  Transportation Needs: Unmet Transportation Needs (05/15/2022)   PRAPARE - Administrator, Civil Service (Medical): Yes    Lack of Transportation (Non-Medical): Yes  Physical Activity: Not on file  Stress: Not on file  Social Connections: Not on file  Intimate Partner Violence: Not At Risk (02/13/2022)   Humiliation, Afraid, Rape, and Kick questionnaire    Fear of Current or Ex-Partner: No    Emotionally Abused: No    Physically Abused: No    Sexually Abused: No    Family History  Problem Relation Age of Onset   Hypertension Mother    Diabetes Father      Current  Outpatient Medications:    acetaminophen (TYLENOL) 500 MG tablet, Take 500 mg by mouth every 6 (six) hours as needed., Disp: , Rfl:    brimonidine-timolol (COMBIGAN) 0.2-0.5 % ophthalmic solution, INT 1 GTT IN OU BID, Disp: , Rfl:    Cholecalciferol (VITAMIN D3) 1000 units CAPS, Take 2,000 Units by mouth daily., Disp: , Rfl:    diltiazem (CARDIZEM) 60 MG tablet, Take 1 tablet (60 mg total) by mouth every 6 (six) hours., Disp: 180 tablet, Rfl: 0   diphenoxylate-atropine (LOMOTIL) 2.5-0.025 MG tablet, Take 2 tablets by mouth 4 (four) times daily as needed for diarrhea or  loose stools., Disp: 120 tablet, Rfl: 0   docusate (COLACE) 50 MG/5ML liquid, Take by mouth daily., Disp: , Rfl:    fluticasone (FLONASE) 50 MCG/ACT nasal spray, Place 2 sprays into both nostrils daily., Disp: , Rfl:    HYDROcodone-acetaminophen (NORCO/VICODIN) 5-325 MG tablet, Take 1 tablet by mouth every 6 (six) hours as needed for moderate pain., Disp: 15 tablet, Rfl: 0   ipratropium-albuterol (DUONEB) 0.5-2.5 (3) MG/3ML SOLN, Take 3 mLs by nebulization every 6 (six) hours., Disp: 360 mL, Rfl: 0   loperamide (IMODIUM) 2 MG capsule, Take 2 capsules (4 mg) at first loose stool then 1 capsule (2 mg) with each subsequent loose stool. Do not exceed 8 capsules (16 mg) in 24 hour period., Disp: 30 capsule, Rfl: 0   loratadine (CLARITIN) 10 MG tablet, Take 10 mg by mouth daily., Disp: , Rfl:    magic mouthwash (nystatin, lidocaine, diphenhydrAMINE, alum & mag hydroxide) suspension, Swish and swallow 5 mLs 4 (four) times daily as needed for mouth pain., Disp: 240 mL, Rfl: 1   magnesium chloride (SLOW-MAG) 64 MG TBEC SR tablet, Take 1 tablet (64 mg total) by mouth daily., Disp: 14 tablet, Rfl: 0   Multiple Vitamin (MULTI-VITAMINS) TABS, Take 1 tablet by mouth daily. once daily., Disp: , Rfl:    pantoprazole (PROTONIX) 40 MG tablet, Take 40 mg by mouth 2 (two) times daily., Disp: , Rfl:    potassium chloride (KLOR-CON M) 10 MEQ tablet, Take 1  tablet (10 mEq total) by mouth daily., Disp: 14 tablet, Rfl: 0   predniSONE (DELTASONE) 10 MG tablet, Take 3 tablets (30 mg total) by mouth daily with breakfast. Please take 3 pills daily in the morning for 10 days. Make sure to eat something before taking pills, Disp: 30 tablet, Rfl: 0   sucralfate (CARAFATE) 1 G tablet, Take 1 g by mouth 4 (four) times daily. , Disp: , Rfl:    traZODone (DESYREL) 150 MG tablet, Take 300 mg by mouth at bedtime. , Disp: , Rfl:    venlafaxine XR (EFFEXOR-XR) 150 MG 24 hr capsule, Take 150 mg by mouth daily with breakfast. , Disp: , Rfl:    Ipratropium-Albuterol (COMBIVENT) 20-100 MCG/ACT AERS respimat, Inhale 1 puff into the lungs every 6 (six) hours. For SOB, Disp: 4 g, Rfl: 1 No current facility-administered medications for this visit.  Facility-Administered Medications Ordered in Other Visits:    0.9 %  sodium chloride infusion, , Intravenous, Continuous, Creig Hines, MD, Stopped at 05/15/22 1515   heparin lock flush 100 UNIT/ML injection, , , ,   Physical exam:  Vitals:   05/15/22 1322  BP: 118/68  Pulse: 93  Resp: 16  Temp: (!) 97.2 F (36.2 C)  TempSrc: Tympanic  SpO2: 95%  Weight: 66 lb 3.2 oz (30 kg)  Height: 5' (1.524 m)   Physical Exam Constitutional:      Comments: She is thin and cachectic.  Appears in no acute distress  Cardiovascular:     Rate and Rhythm: Normal rate and regular rhythm.     Heart sounds: Normal heart sounds.  Pulmonary:     Effort: Pulmonary effort is normal.     Breath sounds: Normal breath sounds.  Abdominal:     General: Bowel sounds are normal.     Palpations: Abdomen is soft.  Skin:    General: Skin is warm and dry.  Neurological:     Mental Status: She is alert and oriented to person, place, and time.  Latest Ref Rng & Units 05/15/2022    1:01 PM  CMP  Glucose 70 - 99 mg/dL 161   BUN 8 - 23 mg/dL 45   Creatinine 0.96 - 1.00 mg/dL 0.45   Sodium 409 - 811 mmol/L 136   Potassium 3.5 - 5.1  mmol/L 4.3   Chloride 98 - 111 mmol/L 104   CO2 22 - 32 mmol/L 24   Calcium 8.9 - 10.3 mg/dL 8.1       Latest Ref Rng & Units 05/07/2022    9:09 AM  CBC  WBC 4.0 - 10.5 K/uL 8.6   Hemoglobin 12.0 - 15.0 g/dL 91.4   Hematocrit 78.2 - 46.0 % 39.1   Platelets 150 - 400 K/uL 284      Assessment and plan- Patient is a 77 y.o. female  with history of non-small cell lung cancer involving the right middle lobe stage IV T1b N2 M1.  She is s/p concurrent chemoradiation with weekly CarboTaxol.  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  She is currently on 30 mg of prednisone and I would like to taper her prednisone by 5 mg each week so she can come off prednisone altogether in 6 weeks.  She can also back out on her Lomotil and Imodium at this time.  Initially my plan was for her to see GI and get a flexible sigmoidoscopy last week.  However given that her diarrhea is better now holding off on flexible sigmoidoscopy today.  If diarrhea recurs upon steroid taper I will refer her to GI at that time  1 L of IV fluids today and I will see her back in 2 weeks with labs for possible fluids.  Patient is 45 and is cachectic and frail.  She has had grade 2 colitis likely secondary to durvalumab.  Although we could technically rechallenge her down the line I am concerned about an aggressive recurrence of her diarrhea that may land up needing Biologics.  I am putting her durvalumab plan on hold for now.   Visit Diagnosis 1. Colitis      Dr. Owens Shark, MD, MPH [facility] [phone] 05/15/2022 3:21 PM

## 2022-05-16 ENCOUNTER — Ambulatory Visit: Payer: Medicare Other

## 2022-05-16 ENCOUNTER — Ambulatory Visit: Payer: Medicare Other | Admitting: Oncology

## 2022-05-16 ENCOUNTER — Other Ambulatory Visit: Payer: Medicare Other

## 2022-05-16 NOTE — Telephone Encounter (Signed)
Per Dr Smith Robert   Yes. Her diarrhea is significantly better. We will hold off. She was quite bad last week. If she worsens again I will reach out to you

## 2022-05-20 ENCOUNTER — Other Ambulatory Visit: Payer: Self-pay

## 2022-05-22 ENCOUNTER — Other Ambulatory Visit: Payer: Self-pay | Admitting: *Deleted

## 2022-05-22 MED ORDER — PREDNISONE 10 MG PO TABS: 20.0000 mg | ORAL_TABLET | Freq: Every day | ORAL | 0 refills | Status: DC

## 2022-05-23 ENCOUNTER — Encounter: Payer: Self-pay | Admitting: Oncology

## 2022-05-24 ENCOUNTER — Ambulatory Visit
Admission: RE | Admit: 2022-05-24 | Discharge: 2022-05-24 | Disposition: A | Discharge status: Home / Self Care | Payer: Medicare Other | Source: Ambulatory Visit | Attending: Oncology | Admitting: Oncology

## 2022-05-24 ENCOUNTER — Inpatient Hospital Stay: Payer: Medicare Other

## 2022-05-24 DIAGNOSIS — C342 Malignant neoplasm of middle lobe, bronchus or lung: Secondary | ICD-10-CM | POA: Insufficient documentation

## 2022-05-24 MED ORDER — IOHEXOL 300 MG/ML  SOLN
80.0000 mL | Freq: Once | INTRAMUSCULAR | Status: AC | PRN
  Administered 2022-05-24: 60 mL via INTRAVENOUS

## 2022-05-28 ENCOUNTER — Ambulatory Visit
Admission: RE | Admit: 2022-05-28 | Discharge: 2022-05-28 | Disposition: A | Discharge status: Home / Self Care | Payer: Medicare Other | Source: Ambulatory Visit | Attending: Radiation Oncology | Admitting: Radiation Oncology

## 2022-05-28 ENCOUNTER — Encounter: Payer: Self-pay | Admitting: Radiation Oncology

## 2022-05-28 ENCOUNTER — Inpatient Hospital Stay: Payer: Medicare Other

## 2022-05-28 VITALS — BP 119/71 | HR 101 | Temp 98.9°F | Resp 20 | Ht 60.0 in | Wt <= 1120 oz

## 2022-05-28 DIAGNOSIS — C7952 Secondary malignant neoplasm of bone marrow: Secondary | ICD-10-CM | POA: Insufficient documentation

## 2022-05-28 DIAGNOSIS — C7951 Secondary malignant neoplasm of bone: Secondary | ICD-10-CM | POA: Diagnosis present

## 2022-05-28 NOTE — Progress Notes (Signed)
Radiation Oncology Follow up Note  Name: Carol Shaffer   Date:   05/28/2022 MRN:  [id number] DOB: Jan 28, 1946    This 77 y.o. female presents to the clinic today for 1 month follow-up status post palliative radiation therapy to T2 vertebral body.  REFERRING PROVIDER: Jaclyn Shaggy, MD  HPI: Patient is a 77 year old female who is.  I received multiple courses of radiation therapy for lung cancer most recently to her T2 vertebral body for metastatic disease.  Seen today in routine follow-up she is doing well she does have aches and pains although she states the pain has remarkably improved.  She had been on maintenance Durvalumab although secondary to diarrhea that has been discontinued temporarily.  COMPLICATIONS OF TREATMENT: none  FOLLOW UP COMPLIANCE: keeps appointments   PHYSICAL EXAM:  BP 119/71 (BP Location: Left Arm, Patient Position: Sitting, Cuff Size: Small)   Pulse (!) 101   Temp 98.9 F (37.2 C) (Tympanic)   Resp 20   Ht 5' (1.524 m) Comment: stated ht  Wt 65 lb 9.6 oz (29.8 kg)   BMI 12.81 kg/m  Deep palpation of her spine does not elicit pain.  Deep palpation of her ribs does not elicit pain.  Well-developed well-nourished patient in NAD. HEENT reveals PERLA, EOMI, discs not visualized.  Oral cavity is clear. No oral mucosal lesions are identified. Neck is clear without evidence of cervical or supraclavicular adenopathy. Lungs are clear to A&P. Cardiac examination is essentially unremarkable with regular rate and rhythm without murmur rub or thrill. Abdomen is benign with no organomegaly or masses noted. Motor sensory and DTR levels are equal and symmetric in the upper and lower extremities. Cranial nerves II through XII are grossly intact. Proprioception is intact. No peripheral adenopathy or edema is identified. No motor or sensory levels are noted. Crude visual fields are within normal range.  RADIOLOGY RESULTS: She had a recent CT scan which I have reviewed no report has  been issued I see no evidence of progressive disease on my preliminary assessment.  PLAN: Present time patient is from a palliative standpoint is doing well.  She will see Dr. Smith Robert for continuation of her immunotherapy.  I will turn follow-up care over to medical oncology.  I be happy to reevaluate the patient in time should that be indicated.  I would like to take this opportunity to thank you for allowing me to participate in the care of your patient.Carmina Miller, MD

## 2022-05-29 ENCOUNTER — Other Ambulatory Visit: Payer: Self-pay | Admitting: *Deleted

## 2022-05-29 DIAGNOSIS — C342 Malignant neoplasm of middle lobe, bronchus or lung: Secondary | ICD-10-CM

## 2022-05-30 ENCOUNTER — Inpatient Hospital Stay: Payer: Medicare Other | Attending: Oncology

## 2022-05-30 ENCOUNTER — Inpatient Hospital Stay: Payer: Medicare Other

## 2022-05-30 ENCOUNTER — Inpatient Hospital Stay (HOSPITAL_BASED_OUTPATIENT_CLINIC_OR_DEPARTMENT_OTHER): Payer: Medicare Other | Admitting: Oncology

## 2022-05-30 ENCOUNTER — Encounter: Payer: Self-pay | Admitting: Oncology

## 2022-05-30 ENCOUNTER — Other Ambulatory Visit: Payer: Self-pay | Admitting: *Deleted

## 2022-05-30 VITALS — BP 117/75 | HR 96 | Temp 95.9°F | Resp 16 | Ht 60.0 in | Wt <= 1120 oz

## 2022-05-30 DIAGNOSIS — Z79899 Other long term (current) drug therapy: Secondary | ICD-10-CM | POA: Diagnosis not present

## 2022-05-30 DIAGNOSIS — Z9884 Bariatric surgery status: Secondary | ICD-10-CM | POA: Diagnosis not present

## 2022-05-30 DIAGNOSIS — Z95828 Presence of other vascular implants and grafts: Secondary | ICD-10-CM

## 2022-05-30 DIAGNOSIS — D509 Iron deficiency anemia, unspecified: Secondary | ICD-10-CM

## 2022-05-30 DIAGNOSIS — Z87891 Personal history of nicotine dependence: Secondary | ICD-10-CM | POA: Insufficient documentation

## 2022-05-30 DIAGNOSIS — Z7952 Long term (current) use of systemic steroids: Secondary | ICD-10-CM | POA: Diagnosis not present

## 2022-05-30 DIAGNOSIS — K521 Toxic gastroenteritis and colitis: Secondary | ICD-10-CM | POA: Insufficient documentation

## 2022-05-30 DIAGNOSIS — Z923 Personal history of irradiation: Secondary | ICD-10-CM | POA: Insufficient documentation

## 2022-05-30 DIAGNOSIS — C342 Malignant neoplasm of middle lobe, bronchus or lung: Secondary | ICD-10-CM

## 2022-05-30 DIAGNOSIS — K529 Noninfective gastroenteritis and colitis, unspecified: Secondary | ICD-10-CM | POA: Diagnosis not present

## 2022-05-30 DIAGNOSIS — D801 Nonfamilial hypogammaglobulinemia: Secondary | ICD-10-CM | POA: Insufficient documentation

## 2022-05-30 DIAGNOSIS — T451X5A Adverse effect of antineoplastic and immunosuppressive drugs, initial encounter: Secondary | ICD-10-CM | POA: Insufficient documentation

## 2022-05-30 LAB — CBC WITH DIFFERENTIAL (CANCER CENTER ONLY)
Abs Immature Granulocytes: 0.12 10*3/uL — ABNORMAL HIGH (ref 0.00–0.07)
Basophils Absolute: 0 10*3/uL (ref 0.0–0.1)
Basophils Relative: 0 %
Eosinophils Absolute: 0 10*3/uL (ref 0.0–0.5)
Eosinophils Relative: 0 %
HCT: 37.1 % (ref 36.0–46.0)
Hemoglobin: 11.6 g/dL — ABNORMAL LOW (ref 12.0–15.0)
Immature Granulocytes: 1 %
Lymphocytes Relative: 18 %
Lymphs Abs: 1.5 10*3/uL (ref 0.7–4.0)
MCH: 27.6 pg (ref 26.0–34.0)
MCHC: 31.3 g/dL (ref 30.0–36.0)
MCV: 88.3 fL (ref 80.0–100.0)
Monocytes Absolute: 0.4 10*3/uL (ref 0.1–1.0)
Monocytes Relative: 4 %
Neutro Abs: 6.4 10*3/uL (ref 1.7–7.7)
Neutrophils Relative %: 77 %
Platelet Count: 271 10*3/uL (ref 150–400)
RBC: 4.2 MIL/uL (ref 3.87–5.11)
RDW: 22.7 % — ABNORMAL HIGH (ref 11.5–15.5)
WBC Count: 8.5 10*3/uL (ref 4.0–10.5)
nRBC: 0 % (ref 0.0–0.2)

## 2022-05-30 LAB — COMPREHENSIVE METABOLIC PANEL
ALT: 52 U/L — ABNORMAL HIGH (ref 0–44)
AST: 34 U/L (ref 15–41)
Albumin: 3.3 g/dL — ABNORMAL LOW (ref 3.5–5.0)
Alkaline Phosphatase: 71 U/L (ref 38–126)
Anion gap: 8 (ref 5–15)
BUN: 45 mg/dL — ABNORMAL HIGH (ref 8–23)
CO2: 25 mmol/L (ref 22–32)
Calcium: 8.4 mg/dL — ABNORMAL LOW (ref 8.9–10.3)
Chloride: 100 mmol/L (ref 98–111)
Creatinine, Ser: 0.62 mg/dL (ref 0.44–1.00)
GFR, Estimated: >60 mL/min (ref >60)
Glucose, Bld: 128 mg/dL — ABNORMAL HIGH (ref 70–99)
Potassium: 4.4 mmol/L (ref 3.5–5.1)
Sodium: 133 mmol/L — ABNORMAL LOW (ref 135–145)
Total Bilirubin: 0.4 mg/dL (ref 0.3–1.2)
Total Protein: 5.5 g/dL — ABNORMAL LOW (ref 6.5–8.1)

## 2022-05-30 MED ORDER — SODIUM CHLORIDE 0.9 % IV SOLN
INTRAVENOUS | Status: AC
  Filled 2022-05-30 (×2): qty 250

## 2022-05-30 MED ORDER — PREDNISONE 5 MG PO TABS: 5.0000 mg | ORAL_TABLET | ORAL | 0 refills | Status: DC

## 2022-05-30 MED ORDER — HEPARIN SOD (PORK) LOCK FLUSH 100 UNIT/ML IV SOLN
500.0000 [IU] | Freq: Once | INTRAVENOUS | Status: AC | PRN
  Administered 2022-05-30: 500 [IU]
  Filled 2022-05-30: qty 5

## 2022-05-30 MED ORDER — SODIUM CHLORIDE 0.9% FLUSH
10.0000 mL | Freq: Once | INTRAVENOUS | Status: AC
  Administered 2022-05-30: 10 mL via INTRAVENOUS
  Filled 2022-05-30: qty 10

## 2022-05-30 NOTE — Progress Notes (Signed)
Hematology/Oncology Consult note [facility]  Telephone:([phone] [phone]  Patient Care Team: Jaclyn Shaggy, MD as PCP - General (Internal Medicine) Derwood Kaplan, MD (Internal Medicine) Lemar Livings, Merrily Pew, MD (General Surgery) Glory Buff, RN as Oncology Nurse Navigator Carmina Miller, MD as Referring Physician (Radiation Oncology) Creig Hines, MD as Consulting Physician (Oncology)   Name of the patient: Carol Shaffer  [id number]  Nov 17, 1945   Date of visit: 05/30/22  Diagnosis- non-small cell lung cancer involving the right middle lobe stage IV T1b N2 M1       Chief complaint/ Reason for visit- routine f/u of colitis  Heme/Onc history: Patient is a 77 year old female with a history oflung cancer in 2012 s/p right upper lobe resection.  Low dose chest CT on 09/04/2018 revealed multiple small pulmonary nodules are noted in the lungs bilaterally. In addition, there was a 1.53 cm concerning nodule in the right lower lobe. There was diffuse bronchial wall thickening with mild centrilobular and paraseptal emphysema; imaging findings suggestive of underlying COPD.PET scan on 09/16/2018 revealed low level FDG uptake associated with the right lower lobe lung nodule (SUV 1.57). This was a nonspecific finding. Given the size of this nodule and the low level uptake, findings may reflect inflammatory or infectious nodule. Malignancy was not entirely excluded.  Low dose chest CT on 12/04/2018 revealed a 15.3 mm RLL nodule unchanged in size.   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  There were some ground-glass attenuation components, but is predominantly solid in appearance with some internal air bronchograms, with macrolobulated and spiculated borders, clearly increased in size.   RLL nodule CT guided biopsy  on 06/17/2019 revealed a non-small cell carcinoma c/w adenocarcinoma.  Tumor was positive for CK7 and TTF-1 and negative for p40.  She is not a surgical candidate.   She received 6000 cGy SBRT to the RLL from 08/05/2019 - 08/17/2019.   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  History of iron deficiency anemia and she has undergone work-up in the past.  She has a history of gastric bypass surgery.    Patient hadCT scan of breast 2023 which showed narrowing and occlusion of the right middle lobe bronchus which was looking more prominent as compared to prior scans.  There was also concern for paratracheal adenopathy which had increased in size as compared to prior scans.  Patient was seen by pulmonary and underwent bronchoscopy guided biopsy.  Bronchial biopsies of the right middle lobe did not show any malignancy however cytology from station 7 and station 4R lymph node was consistent with adenocarcinoma.  PET scan also showed hypermetabolism in the T2 vertebral body lesion concerning for metastatic disease   Patient completed concurrent chemoradiation with a curative intent.  Repeat scan showed partial response and plan is to proceed with maintenance durvalumab.  There was not adequate sample to perform NGS testing.  PD-L1 less than 1%.  Interval history-patient is currently on 15 mg of prednisone and overall feels much better.  Diarrhea has nearly resolved.  She had 1 episode of diarrhea this morning which was well-controlled with Lomotil.  Appetite is improved.  ECOG PS- 2 Pain scale- 0   Review of systems- Review of Systems  Constitutional:  Positive for malaise/fatigue. Negative for chills, fever and weight loss.  HENT:  Negative for  congestion, ear discharge and nosebleeds.   Eyes:  Negative for blurred vision.  Respiratory:  Negative for cough, hemoptysis, sputum production, shortness of breath and wheezing.   Cardiovascular:  Negative for chest pain,  palpitations, orthopnea and claudication.  Gastrointestinal:  Negative for abdominal pain, blood in stool, constipation, diarrhea, heartburn, melena, nausea and vomiting.  Genitourinary:  Negative for dysuria, flank pain, frequency, hematuria and urgency.  Musculoskeletal:  Negative for back pain, joint pain and myalgias.  Skin:  Negative for rash.  Neurological:  Negative for dizziness, tingling, focal weakness, seizures, weakness and headaches.  Endo/Heme/Allergies:  Does not bruise/bleed easily.  Psychiatric/Behavioral:  Negative for depression and suicidal ideas. The patient does not have insomnia.       Allergies  Allergen Reactions   Hydromorphone Hcl Itching   Bentyl [Dicyclomine] Nausea And Vomiting   Biaxin [Clarithromycin] Other (See Comments)    hallucinations   Budesonide Other (See Comments)    Ulcer   Erythromycin Nausea And Vomiting   Reglan [Metoclopramide] Other (See Comments)    tremors     Past Medical History:  Diagnosis Date   Arthritis    hands   Avascular necrosis of hip, right (HCC)    Blepharospasm    Cancer (HCC) 2011   Right Upper Lobe Lobectomy   Chronic diarrhea    Chronic diarrhea    Collagenous colitis    Complication of anesthesia    usually wakes up during procedures  (endoscopy and colonoscopy)   COPD (chronic obstructive pulmonary disease) (HCC)    COVID 2022   Depression    Diverticulosis    Gastric outlet obstruction    Headache    every couple of days   Hemorrhoid    History of Crohn's disease    Hypoglycemic disorder    IDA (iron deficiency anemia)    Intestinal adhesions    Multiple gastric ulcers    Multiple gastric ulcers    Nausea and vomiting 07/03/2016   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HCC)    Stroke (HCC) 8 yrs ago   double vision left eye     Past Surgical History:  Procedure Laterality Date   APPENDECTOMY  1989   botox injections for blepharospasm     BREAST SURGERY     CATARACT EXTRACTION      COLON RESECTION  2005   COLONOSCOPY  10-29-2008   Dr Lemar Livings   COLONOSCOPY WITH PROPOFOL N/A 03/05/2017   Procedure: COLONOSCOPY WITH PROPOFOL;  Surgeon: Toledo, Boykin Nearing, MD;  Location: [facility];  Service: Gastroenterology;  Laterality: N/A;   COLOSTOMY REVERSAL  2006   ESOPHAGOGASTRODUODENOSCOPY (EGD) WITH PROPOFOL N/A 07/18/2016   Procedure: ESOPHAGOGASTRODUODENOSCOPY (EGD) WITH PROPOFOL;  Surgeon: Earline Mayotte, MD;  Location: [facility];  Service: Endoscopy;  Laterality: N/A;   ESOPHAGOGASTRODUODENOSCOPY (EGD) WITH PROPOFOL N/A 08/03/2016   Procedure: ESOPHAGOGASTRODUODENOSCOPY (EGD) WITH PROPOFOL;  Surgeon: Earline Mayotte, MD;  Location: [facility];  Service: Endoscopy;  Laterality: N/A;   ESOPHAGOGASTRODUODENOSCOPY (EGD) WITH PROPOFOL N/A 09/01/2018   Procedure: ESOPHAGOGASTRODUODENOSCOPY (EGD) WITH PROPOFOL;  Surgeon: Toledo, Boykin Nearing, MD;  Location: [facility];  Service: Gastroenterology;  Laterality: N/A;   ESOPHAGOGASTRODUODENOSCOPY (EGD) WITH PROPOFOL N/A 12/01/2018   Procedure: ESOPHAGOGASTRODUODENOSCOPY (EGD) WITH PROPOFOL;  Surgeon: Toledo, Boykin Nearing, MD;  Location: [facility];  Service: Gastroenterology;  Laterality: N/A;   ESOPHAGOGASTRODUODENOSCOPY (EGD) WITH PROPOFOL N/A 02/23/2019   Procedure: ESOPHAGOGASTRODUODENOSCOPY (EGD) WITH PROPOFOL;  Surgeon: Midge Minium, MD;  Location: [facility];  Service: Endoscopy;  Laterality: N/A;  ESOPHAGOSCOPY WITH DILITATION  [id number]   Henry Russel   EYE SURGERY     FLEXIBLE BRONCHOSCOPY N/A 10/30/2021   Procedure: FLEXIBLE BRONCHOSCOPY;  Surgeon: Raechel Chute, MD;  Location: [facility];  Service: Pulmonary;  Laterality: N/A;   GASTRECTOMY     gastric ulcer  1989, 1991   IR IMAGING GUIDED PORT INSERTION  11/14/2021   JOINT REPLACEMENT     right total hip arthroplasty 03/03/02   LAPAROSCOPIC LYSIS OF ADHESIONS     LUNG CANCER SURGERY  2011   LYSIS OF ADHESION     PLACEMENT OF BREAST IMPLANTS  1973    thoracotomy with right upper lobectomy and central compartment node dissection     UPPER GASTROINTESTINAL ENDOSCOPY  10-29-2008   Dr Lemar Livings   VIDEO BRONCHOSCOPY WITH ENDOBRONCHIAL ULTRASOUND N/A 10/30/2021   Procedure: VIDEO BRONCHOSCOPY WITH ENDOBRONCHIAL ULTRASOUND;  Surgeon: Raechel Chute, MD;  Location: [facility];  Service: Pulmonary;  Laterality: N/A;    Social History   Socioeconomic History   Marital status: Widowed    Spouse name: Not on file   Number of children: 2   Years of education: Not on file   Highest education level: Not on file  Occupational History   Occupation: retired    Comment: Runner, broadcasting/film/video  Tobacco Use   Smoking status: Former    Packs/day: 1.00    Years: 25.00    Additional pack years: 0.00    Total pack years: 25.00    Types: Cigarettes    Quit date: 01/30/2008    Years since quitting: 14.3   Smokeless tobacco: Never  Vaping Use   Vaping Use: Never used  Substance and Sexual Activity   Alcohol use: Yes    Comment: occ. for holiday   Drug use: No   Sexual activity: Not Currently  Other Topics Concern   Not on file  Social History Narrative   Lives alone    Social Determinants of Health   Financial Resource Strain: Not on file  Food Insecurity: No Food Insecurity (02/13/2022)   Hunger Vital Sign    Worried About Running Out of Food in the Last Year: Never true    Ran Out of Food in the Last Year: Never true  Transportation Needs: Unmet Transportation Needs (05/28/2022)   PRAPARE - Administrator, Civil Service (Medical): Yes    Lack of Transportation (Non-Medical): Yes  Physical Activity: Not on file  Stress: Not on file  Social Connections: Not on file  Intimate Partner Violence: Not At Risk (02/13/2022)   Humiliation, Afraid, Rape, and Kick questionnaire    Fear of Current or Ex-Partner: No    Emotionally Abused: No    Physically Abused: No    Sexually Abused: No    Family History  Problem Relation Age of Onset   Hypertension  Mother    Diabetes Father      Current Outpatient Medications:    acetaminophen (TYLENOL) 500 MG tablet, Take 500 mg by mouth every 6 (six) hours as needed., Disp: , Rfl:    brimonidine-timolol (COMBIGAN) 0.2-0.5 % ophthalmic solution, INT 1 GTT IN OU BID, Disp: , Rfl:    Cholecalciferol (VITAMIN D3) 1000 units CAPS, Take 2,000 Units by mouth daily., Disp: , Rfl:    diltiazem (CARDIZEM) 60 MG tablet, Take 1 tablet (60 mg total) by mouth every 6 (six) hours., Disp: 180 tablet, Rfl: 0   diphenoxylate-atropine (LOMOTIL) 2.5-0.025 MG tablet, Take 2 tablets by mouth 4 (four) times daily as  needed for diarrhea or loose stools., Disp: 120 tablet, Rfl: 0   docusate (COLACE) 50 MG/5ML liquid, Take by mouth daily., Disp: , Rfl:    fluticasone (FLONASE) 50 MCG/ACT nasal spray, Place 2 sprays into both nostrils daily., Disp: , Rfl:    HYDROcodone-acetaminophen (NORCO/VICODIN) 5-325 MG tablet, Take 1 tablet by mouth every 6 (six) hours as needed for moderate pain., Disp: 15 tablet, Rfl: 0   Ipratropium-Albuterol (COMBIVENT) 20-100 MCG/ACT AERS respimat, Inhale 1 puff into the lungs every 6 (six) hours. For SOB, Disp: 4 g, Rfl: 1   ipratropium-albuterol (DUONEB) 0.5-2.5 (3) MG/3ML SOLN, Take 3 mLs by nebulization every 6 (six) hours., Disp: 360 mL, Rfl: 0   loperamide (IMODIUM) 2 MG capsule, Take 2 capsules (4 mg) at first loose stool then 1 capsule (2 mg) with each subsequent loose stool. Do not exceed 8 capsules (16 mg) in 24 hour period., Disp: 30 capsule, Rfl: 0   loratadine (CLARITIN) 10 MG tablet, Take 10 mg by mouth daily., Disp: , Rfl:    magnesium chloride (SLOW-MAG) 64 MG TBEC SR tablet, Take 1 tablet (64 mg total) by mouth daily., Disp: 14 tablet, Rfl: 0   Multiple Vitamin (MULTI-VITAMINS) TABS, Take 1 tablet by mouth daily. once daily., Disp: , Rfl:    pantoprazole (PROTONIX) 40 MG tablet, Take 40 mg by mouth 2 (two) times daily., Disp: , Rfl:    potassium chloride (KLOR-CON M) 10 MEQ tablet, Take  1 tablet (10 mEq total) by mouth daily., Disp: 14 tablet, Rfl: 0   predniSONE (DELTASONE) 5 MG tablet, Take 1 tablet (5 mg total) by mouth as directed. 3 tablets daily for 7 days 2 tablets for 7 days  daily , then 1 tablet for 7 days-please take in am with food, Disp: 42 tablet, Rfl: 0   sucralfate (CARAFATE) 1 G tablet, Take 1 g by mouth 4 (four) times daily. , Disp: , Rfl:    traZODone (DESYREL) 150 MG tablet, Take 300 mg by mouth at bedtime. , Disp: , Rfl:    venlafaxine XR (EFFEXOR-XR) 150 MG 24 hr capsule, Take 150 mg by mouth daily with breakfast. , Disp: , Rfl:  No current facility-administered medications for this visit.  Facility-Administered Medications Ordered in Other Visits:    heparin lock flush 100 UNIT/ML injection, , , ,   Physical exam:  Vitals:   05/30/22 1309  BP: 117/75  Pulse: 96  Resp: 16  Temp: (!) 95.9 F (35.5 C)  TempSrc: Tympanic  SpO2: (!) 89%  Weight: 66 lb 1.6 oz (30 kg)  Height: 5' (1.524 m)   Physical Exam Constitutional:      Comments: Elderly thin frail woman in no acute distress  Cardiovascular:     Rate and Rhythm: Normal rate and regular rhythm.     Heart sounds: Normal heart sounds.  Pulmonary:     Effort: Pulmonary effort is normal.     Breath sounds: Normal breath sounds.  Abdominal:     General: Bowel sounds are normal.     Palpations: Abdomen is soft.  Skin:    General: Skin is warm and dry.  Neurological:     Mental Status: She is alert and oriented to person, place, and time.         Latest Ref Rng & Units 05/30/2022   12:58 PM  CMP  Glucose 70 - 99 mg/dL 161   BUN 8 - 23 mg/dL 45   Creatinine 0.96 - 1.00 mg/dL 0.45  Sodium 135 - 145 mmol/L 133   Potassium 3.5 - 5.1 mmol/L 4.4   Chloride 98 - 111 mmol/L 100   CO2 22 - 32 mmol/L 25   Calcium 8.9 - 10.3 mg/dL 8.4   Total Protein 6.5 - 8.1 g/dL 5.5   Total Bilirubin 0.3 - 1.2 mg/dL 0.4   Alkaline Phos 38 - 126 U/L 71   AST 15 - 41 U/L 34   ALT 0 - 44 U/L 52        Latest Ref Rng & Units 05/30/2022   12:58 PM  CBC  WBC 4.0 - 10.5 K/uL 8.5   Hemoglobin 12.0 - 15.0 g/dL 16.1   Hematocrit 09.6 - 46.0 % 37.1   Platelets 150 - 400 K/uL 271     No images are attached to the encounter.  CT CHEST ABDOMEN PELVIS W CONTRAST  Result Date: 05/28/2022 CLINICAL DATA:  Lung cancer restaging * Tracking Code: BO * EXAM: CT CHEST, ABDOMEN, AND PELVIS WITH CONTRAST TECHNIQUE: Multidetector CT imaging of the chest, abdomen and pelvis was performed following the standard protocol during bolus administration of intravenous contrast. RADIATION DOSE REDUCTION: This exam was performed according to the departmental dose-optimization program which includes automated exposure control, adjustment of the mA and/or kV according to patient size and/or use of iterative reconstruction technique. CONTRAST:  60mL OMNIPAQUE IOHEXOL 300 MG/ML SOLN additional oral enteric contrast COMPARISON:  CT chest angiogram, 02/12/2022, CT chest abdomen pelvis, 01/24/2022 FINDINGS: CT CHEST FINDINGS Cardiovascular: Right chest port catheter. Aortic atherosclerosis. Normal heart size. Left and right coronary artery calcifications. No pericardial effusion. Mediastinum/Nodes: Diminished size of previously seen enlarged subcarinal and right hilar lymph nodes, with matted residual soft tissue but no discretely measurable enlarged lymph nodes (series 2, image 25). Thyroid gland, trachea, and esophagus demonstrate no significant findings. Lungs/Pleura: Status post right upper lobectomy. Severe emphysema. Significant interval increase in perihilar fibrosis and consolidation of the right lung about a previously seen spiculated mass of the superior segment right lower lobe, which is now difficult to distinctly visualize (series 3, image 55). Unchanged subpleural nodule of the peripheral lateral segment right middle lobe measuring 0.3 cm (series 3, image 43). No pleural effusion or pneumothorax. Musculoskeletal: No chest wall  abnormality. Calcified bilateral breast implants. No acute osseous findings. Unchanged sclerotic pathologic fracture of the lateral right seventh rib overlying mass (series 3, image 67). Unchanged high-grade sclerotic wedge deformity of T7 (series 6, image 65). CT ABDOMEN PELVIS FINDINGS Hepatobiliary: No focal liver abnormality is seen. Status post cholecystectomy. Unchanged postoperative biliary dilatation. Pancreas: Unremarkable. No pancreatic ductal dilatation or surrounding inflammatory changes. Spleen: Normal in size without significant abnormality. Adrenals/Urinary Tract: Adrenal glands are unremarkable. Simple, benign bilateral renal cortical cysts, for which no further follow-up or characterization is required. Kidneys are otherwise normal, without renal calculi, solid lesion, or hydronephrosis. Evaluation of the bladder is very limited by dense metallic streak artifact from adjacent right hip total arthroplasty; no obvious abnormality. Stomach/Bowel: Stomach is within normal limits. Surgical clips about the gastric body (series 2, image 60). Evidence of proximal small bowel resection in the left hemiabdomen, with similar, patulous appearance of the duodenum and proximal jejunum (series 2, image 79). Vascular/Lymphatic: Aortic atherosclerosis. No enlarged abdominal or pelvic lymph nodes. Reproductive: Evaluation of the pelvis is very limited by dense metallic streak artifact from adjacent right hip total arthroplasty; no obvious abnormality. Other: No abdominal wall hernia or abnormality. No ascites. Musculoskeletal: No acute osseous findings. Status post right hip total arthroplasty.  IMPRESSION: 1. Status post right upper lobectomy. Significant interval increase in perihilar fibrosis and consolidation of the right lung about a previously seen spiculated mass of the superior segment right lower lobe, which is now difficult to distinctly visualize. 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 2. Diminished size of previously seen enlarged subcarinal and right hilar lymph nodes, with matted residual soft tissue but no discretely measurable enlarged lymph nodes. Findings are consistent with treatment response of nodal metastatic disease. 3. Unchanged sclerotic pathologic fracture of the lateral right seventh rib overlying mass. Unchanged high-grade sclerotic wedge deformity of T7. 4. No evidence of metastatic disease in the abdomen or pelvis. 5. Severe emphysema. 6. Coronary artery disease. Aortic Atherosclerosis (ICD10-I70.0) and Emphysema (ICD10-J43.9). Electronically Signed   By: Jearld Lesch M.D.   On: 05/28/2022 13:21     Assessment and plan- Patient is a 77 y.o. female  with history of non-small cell lung cancer involving the right middle lobe stage IV T1b N2 M1.  She is s/p concurrent chemoradiation with weekly CarboTaxol.  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  Durvalumab has been discontinued and I do not want to rechallenge her in the future given her overall frailty and potential for decompensation and need for Biologics.  She will continue her steroid taper which she will finish over the next 3 weeks.  Symptomatically patient is doing better.  1 L of IV fluids today and she will continue to get it every 3 weeks and I will see her back in 6 weeks.  I reviewed CT chest abdomen pelvis images independently and discussed findings with the patient which does not show any evidence of recurrent or progressive disease.  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  She is on steroid taper for her colitis which would help for possible underlying pneumonitis although clinically patient is asymptomatic.  Continue to monitor   Visit Diagnosis 1. Non-small cell cancer of middle lobe of right lung (HCC)    2. Colitis      Dr. Owens Shark, MD, MPH [facility] [phone] 05/30/2022 4:08 PM

## 2022-06-04 ENCOUNTER — Inpatient Hospital Stay: Payer: Medicare Other | Admitting: Oncology

## 2022-06-04 ENCOUNTER — Other Ambulatory Visit: Payer: Medicare Other

## 2022-06-04 ENCOUNTER — Inpatient Hospital Stay: Payer: Medicare Other

## 2022-06-04 ENCOUNTER — Ambulatory Visit: Payer: Medicare Other | Admitting: Oncology

## 2022-06-04 ENCOUNTER — Ambulatory Visit: Payer: Medicare Other

## 2022-06-20 ENCOUNTER — Ambulatory Visit: Payer: Medicare Other

## 2022-06-20 ENCOUNTER — Other Ambulatory Visit: Payer: Medicare Other

## 2022-06-22 ENCOUNTER — Inpatient Hospital Stay: Payer: Medicare Other

## 2022-06-22 VITALS — BP 108/68 | HR 97 | Temp 98.4°F | Resp 20

## 2022-06-22 DIAGNOSIS — D509 Iron deficiency anemia, unspecified: Secondary | ICD-10-CM

## 2022-06-22 DIAGNOSIS — C342 Malignant neoplasm of middle lobe, bronchus or lung: Secondary | ICD-10-CM

## 2022-06-22 LAB — BASIC METABOLIC PANEL
Anion gap: 8 (ref 5–15)
BUN: 43 mg/dL — ABNORMAL HIGH (ref 8–23)
CO2: 24 mmol/L (ref 22–32)
Calcium: 8.4 mg/dL — ABNORMAL LOW (ref 8.9–10.3)
Chloride: 105 mmol/L (ref 98–111)
Creatinine, Ser: 0.51 mg/dL (ref 0.44–1.00)
GFR, Estimated: >60 mL/min (ref >60)
Glucose, Bld: 116 mg/dL — ABNORMAL HIGH (ref 70–99)
Potassium: 3.5 mmol/L (ref 3.5–5.1)
Sodium: 137 mmol/L (ref 135–145)

## 2022-06-22 LAB — MAGNESIUM: Magnesium: 1.6 mg/dL — ABNORMAL LOW (ref 1.7–2.4)

## 2022-06-22 MED ORDER — SODIUM CHLORIDE 0.9% FLUSH
10.0000 mL | Freq: Once | INTRAVENOUS | Status: AC
  Administered 2022-06-22: 10 mL via INTRAVENOUS
  Filled 2022-06-22: qty 10

## 2022-06-22 MED ORDER — MAGNESIUM SULFATE 2 GM/50ML IV SOLN
2.0000 g | Freq: Once | INTRAVENOUS | Status: AC
  Administered 2022-06-22: 2 g via INTRAVENOUS
  Filled 2022-06-22: qty 50

## 2022-06-22 MED ORDER — SODIUM CHLORIDE 0.9 % IV SOLN
INTRAVENOUS | Status: AC
  Filled 2022-06-22 (×2): qty 250

## 2022-06-22 MED ORDER — HEPARIN SOD (PORK) LOCK FLUSH 100 UNIT/ML IV SOLN
500.0000 [IU] | Freq: Once | INTRAVENOUS | Status: AC
  Administered 2022-06-22: 500 [IU] via INTRAVENOUS
  Filled 2022-06-22: qty 5

## 2022-07-02 ENCOUNTER — Other Ambulatory Visit: Payer: Medicare Other

## 2022-07-02 ENCOUNTER — Ambulatory Visit: Payer: Medicare Other

## 2022-07-02 ENCOUNTER — Ambulatory Visit: Payer: Medicare Other | Admitting: Oncology

## 2022-07-09 ENCOUNTER — Inpatient Hospital Stay: Payer: Medicare Other | Attending: Oncology

## 2022-07-09 ENCOUNTER — Ambulatory Visit
Admission: RE | Admit: 2022-07-09 | Discharge: 2022-07-09 | Disposition: A | Discharge status: Home / Self Care | Payer: Medicare Other | Source: Ambulatory Visit | Attending: Oncology | Admitting: Oncology

## 2022-07-09 ENCOUNTER — Encounter: Payer: Self-pay | Admitting: Oncology

## 2022-07-09 ENCOUNTER — Ambulatory Visit
Admission: RE | Admit: 2022-07-09 | Discharge: 2022-07-09 | Disposition: A | Discharge status: Home / Self Care | Payer: Medicare Other | Attending: Oncology | Admitting: Oncology

## 2022-07-09 ENCOUNTER — Inpatient Hospital Stay: Payer: Medicare Other

## 2022-07-09 ENCOUNTER — Other Ambulatory Visit: Payer: Self-pay | Admitting: *Deleted

## 2022-07-09 ENCOUNTER — Inpatient Hospital Stay (HOSPITAL_BASED_OUTPATIENT_CLINIC_OR_DEPARTMENT_OTHER): Payer: Medicare Other | Admitting: Oncology

## 2022-07-09 VITALS — BP 132/75 | HR 99 | Temp 98.0°F | Resp 18 | Ht 60.0 in | Wt <= 1120 oz

## 2022-07-09 DIAGNOSIS — K529 Noninfective gastroenteritis and colitis, unspecified: Secondary | ICD-10-CM | POA: Diagnosis not present

## 2022-07-09 DIAGNOSIS — R0789 Other chest pain: Secondary | ICD-10-CM

## 2022-07-09 DIAGNOSIS — C342 Malignant neoplasm of middle lobe, bronchus or lung: Secondary | ICD-10-CM | POA: Diagnosis present

## 2022-07-09 DIAGNOSIS — J432 Centrilobular emphysema: Secondary | ICD-10-CM | POA: Insufficient documentation

## 2022-07-09 DIAGNOSIS — Z9221 Personal history of antineoplastic chemotherapy: Secondary | ICD-10-CM | POA: Diagnosis not present

## 2022-07-09 DIAGNOSIS — Z08 Encounter for follow-up examination after completed treatment for malignant neoplasm: Secondary | ICD-10-CM | POA: Insufficient documentation

## 2022-07-09 DIAGNOSIS — D801 Nonfamilial hypogammaglobulinemia: Secondary | ICD-10-CM | POA: Insufficient documentation

## 2022-07-09 DIAGNOSIS — Z85118 Personal history of other malignant neoplasm of bronchus and lung: Secondary | ICD-10-CM

## 2022-07-09 DIAGNOSIS — E876 Hypokalemia: Secondary | ICD-10-CM

## 2022-07-09 DIAGNOSIS — Z87891 Personal history of nicotine dependence: Secondary | ICD-10-CM | POA: Diagnosis not present

## 2022-07-09 DIAGNOSIS — Z79899 Other long term (current) drug therapy: Secondary | ICD-10-CM | POA: Diagnosis not present

## 2022-07-09 DIAGNOSIS — Z923 Personal history of irradiation: Secondary | ICD-10-CM | POA: Diagnosis not present

## 2022-07-09 DIAGNOSIS — Z7952 Long term (current) use of systemic steroids: Secondary | ICD-10-CM | POA: Insufficient documentation

## 2022-07-09 DIAGNOSIS — Z95828 Presence of other vascular implants and grafts: Secondary | ICD-10-CM

## 2022-07-09 DIAGNOSIS — D509 Iron deficiency anemia, unspecified: Secondary | ICD-10-CM

## 2022-07-09 DIAGNOSIS — Z9884 Bariatric surgery status: Secondary | ICD-10-CM | POA: Insufficient documentation

## 2022-07-09 LAB — CBC WITH DIFFERENTIAL/PLATELET
Abs Immature Granulocytes: 0.27 10*3/uL — ABNORMAL HIGH (ref 0.00–0.07)
Basophils Absolute: 0.1 10*3/uL (ref 0.0–0.1)
Basophils Relative: 1 %
Eosinophils Absolute: 0 10*3/uL (ref 0.0–0.5)
Eosinophils Relative: 0 %
HCT: 41.7 % (ref 36.0–46.0)
Hemoglobin: 13.4 g/dL (ref 12.0–15.0)
Immature Granulocytes: 3 %
Lymphocytes Relative: 18 %
Lymphs Abs: 1.8 10*3/uL (ref 0.7–4.0)
MCH: 30.5 pg (ref 26.0–34.0)
MCHC: 32.1 g/dL (ref 30.0–36.0)
MCV: 95 fL (ref 80.0–100.0)
Monocytes Absolute: 0.8 10*3/uL (ref 0.1–1.0)
Monocytes Relative: 8 %
Neutro Abs: 7 10*3/uL (ref 1.7–7.7)
Neutrophils Relative %: 70 %
Platelets: 255 10*3/uL (ref 150–400)
RBC: 4.39 MIL/uL (ref 3.87–5.11)
RDW: 18.6 % — ABNORMAL HIGH (ref 11.5–15.5)
WBC: 9.9 10*3/uL (ref 4.0–10.5)
nRBC: 0 % (ref 0.0–0.2)

## 2022-07-09 LAB — BASIC METABOLIC PANEL
Anion gap: 7 (ref 5–15)
BUN: 38 mg/dL — ABNORMAL HIGH (ref 8–23)
CO2: 24 mmol/L (ref 22–32)
Calcium: 8 mg/dL — ABNORMAL LOW (ref 8.9–10.3)
Chloride: 105 mmol/L (ref 98–111)
Creatinine, Ser: 0.67 mg/dL (ref 0.44–1.00)
GFR, Estimated: >60 mL/min (ref >60)
Glucose, Bld: 165 mg/dL — ABNORMAL HIGH (ref 70–99)
Potassium: 3.3 mmol/L — ABNORMAL LOW (ref 3.5–5.1)
Sodium: 136 mmol/L (ref 135–145)

## 2022-07-09 MED ORDER — SODIUM CHLORIDE 0.9% FLUSH
10.0000 mL | Freq: Once | INTRAVENOUS | Status: AC
  Administered 2022-07-09: 10 mL via INTRAVENOUS
  Filled 2022-07-09: qty 10

## 2022-07-09 MED ORDER — HEPARIN SOD (PORK) LOCK FLUSH 100 UNIT/ML IV SOLN
500.0000 [IU] | Freq: Once | INTRAVENOUS | Status: AC
  Administered 2022-07-09: 500 [IU] via INTRAVENOUS
  Filled 2022-07-09: qty 5

## 2022-07-09 MED ORDER — SODIUM CHLORIDE 0.9 % IV SOLN
INTRAVENOUS | Status: AC
  Filled 2022-07-09 (×2): qty 250

## 2022-07-09 MED ORDER — METHOCARBAMOL 500 MG PO TABS: 500.0000 mg | ORAL_TABLET | Freq: Two times a day (BID) | ORAL | 0 refills | Status: DC | PRN

## 2022-07-09 MED ORDER — POTASSIUM CHLORIDE 20 MEQ/100ML IV SOLN
20.0000 meq | Freq: Once | INTRAVENOUS | Status: AC
  Administered 2022-07-09: 20 meq via INTRAVENOUS

## 2022-07-09 NOTE — Progress Notes (Signed)
Medications in

## 2022-07-09 NOTE — Addendum Note (Signed)
Addended by: Corene Cornea on: 07/09/2022 02:31 PM   Modules accepted: Orders

## 2022-07-09 NOTE — Progress Notes (Signed)
Hematology/Oncology Consult note [facility]  Telephone:([phone] [phone]  Patient Care Team: Jaclyn Shaggy, MD as PCP - General (Internal Medicine) Derwood Kaplan, MD (Internal Medicine) Lemar Livings, Merrily Pew, MD (General Surgery) Glory Buff, RN as Oncology Nurse Navigator Carmina Miller, MD as Referring Physician (Radiation Oncology) Creig Hines, MD as Consulting Physician (Oncology)   Name of the patient: Carol Shaffer  [id number]  01-Jan-1946   Date of visit: 07/09/22  Diagnosis- non-small cell lung cancer involving the right middle lobe stage IV T1b N2 M1         Chief complaint/ Reason for visit-routine follow-up of lung cancer  Heme/Onc history: Patient is a 77 year old female with a history oflung cancer in 2012 s/p right upper lobe resection.  Low dose chest CT on 09/04/2018 revealed multiple small pulmonary nodules are noted in the lungs bilaterally. In addition, there was a 1.53 cm concerning nodule in the right lower lobe. There was diffuse bronchial wall thickening with mild centrilobular and paraseptal emphysema; imaging findings suggestive of underlying COPD.PET scan on 09/16/2018 revealed low level FDG uptake associated with the right lower lobe lung nodule (SUV 1.57). This was a nonspecific finding. Given the size of this nodule and the low level uptake, findings may reflect inflammatory or infectious nodule. Malignancy was not entirely excluded.  Low dose chest CT on 12/04/2018 revealed a 15.3 mm RLL nodule unchanged in size.   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  There were some ground-glass attenuation components, but is predominantly solid in appearance with some internal air bronchograms, with macrolobulated and spiculated borders, clearly increased in size.   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  Tumor was positive for CK7 and TTF-1 and negative for p40.  She is not a surgical candidate.   She received 6000 cGy SBRT to the RLL from 08/05/2019 - 08/17/2019.   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  History of iron deficiency anemia and she has undergone work-up in the past.  She has a history of gastric bypass surgery.    Patient hadCT scan of breast 2023 which showed narrowing and occlusion of the right middle lobe bronchus which was looking more prominent as compared to prior scans.  There was also concern for paratracheal adenopathy which had increased in size as compared to prior scans.  Patient was seen by pulmonary and underwent bronchoscopy guided biopsy.  Bronchial biopsies of the right middle lobe did not show any malignancy however cytology from station 7 and station 4R lymph node was consistent with adenocarcinoma.  PET scan also showed hypermetabolism in the T2 vertebral body lesion concerning for metastatic disease   Patient completed concurrent chemoradiation with a curative intent.  Repeat scan showed partial response and plan is to proceed with maintenance durvalumab.  There was not adequate sample to perform NGS testing.  PD-L1 less than 1%.  Patient was started on maintenance durvalumab in February 2024.  She developed grade 2 colitis requiring steroid taper and given her overall performance status durvalumab was not rechallenged.  Interval history-patient reports ongoing left chest wall pain which began about 4 weeks ago.  She is not exactly sure what triggered the pain.  She has been using as needed Norco but it has not really been helping  her.  Denies any diarrhea.  Appetite is good and she has gained 3 pounds  ECOG PS- 2 Pain scale- 6 Opioid associated constipation- no  Review of systems- Review of Systems  Constitutional:  Negative for chills,  fever, malaise/fatigue and weight loss.  HENT:  Negative for congestion, ear discharge and nosebleeds.   Eyes:  Negative for blurred vision.  Respiratory:  Negative for cough, hemoptysis, sputum production, shortness of breath and wheezing.   Cardiovascular:  Negative for chest pain, palpitations, orthopnea and claudication.  Gastrointestinal:  Negative for abdominal pain, blood in stool, constipation, diarrhea, heartburn, melena, nausea and vomiting.  Genitourinary:  Negative for dysuria, flank pain, frequency, hematuria and urgency.  Musculoskeletal:  Negative for back pain, joint pain and myalgias.       Left posterior chest wall pain  Skin:  Negative for rash.  Neurological:  Negative for dizziness, tingling, focal weakness, seizures, weakness and headaches.  Endo/Heme/Allergies:  Does not bruise/bleed easily.  Psychiatric/Behavioral:  Negative for depression and suicidal ideas. The patient does not have insomnia.       Allergies  Allergen Reactions   Hydromorphone Hcl Itching   Bentyl [Dicyclomine] Nausea And Vomiting   Biaxin [Clarithromycin] Other (See Comments)    hallucinations   Budesonide Other (See Comments)    Ulcer   Erythromycin Nausea And Vomiting   Reglan [Metoclopramide] Other (See Comments)    tremors     Past Medical History:  Diagnosis Date   Arthritis    hands   Avascular necrosis of hip, right (HCC)    Blepharospasm    Cancer (HCC) 2011   Right Upper Lobe Lobectomy   Chronic diarrhea    Chronic diarrhea    Collagenous colitis    Complication of anesthesia    usually wakes up during procedures  (endoscopy and colonoscopy)   COPD (chronic obstructive pulmonary disease) (HCC)    COVID 2022   Depression    Diverticulosis    Gastric outlet obstruction    Headache    every couple of days   Hemorrhoid    History of Crohn's disease    Hypoglycemic disorder    IDA (iron deficiency anemia)    Intestinal adhesions    Multiple gastric ulcers     Multiple gastric ulcers    Nausea and vomiting 07/03/2016   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HCC)    Stroke (HCC) 8 yrs ago   double vision left eye     Past Surgical History:  Procedure Laterality Date   APPENDECTOMY  1989   botox injections for blepharospasm     BREAST SURGERY     CATARACT EXTRACTION     COLON RESECTION  2005   COLONOSCOPY  10-29-2008   Dr Lemar Livings   COLONOSCOPY WITH PROPOFOL N/A 03/05/2017   Procedure: COLONOSCOPY WITH PROPOFOL;  Surgeon: Toledo, Boykin Nearing, MD;  Location: [facility];  Service: Gastroenterology;  Laterality: N/A;   COLOSTOMY REVERSAL  2006   ESOPHAGOGASTRODUODENOSCOPY (EGD) WITH PROPOFOL N/A 07/18/2016   Procedure: ESOPHAGOGASTRODUODENOSCOPY (EGD) WITH PROPOFOL;  Surgeon: Earline Mayotte, MD;  Location: [facility] END[facility];  Service: Endoscopy;  Laterality: N/A;   ESOPHAGOGASTRODUODENOSCOPY (EGD) WITH PROPOFOL N/A 08/03/2016   Procedure: ESOPHAGOGASTRODUODENOSCOPY (EGD) WITH PROPOFOL;  Surgeon: Earline Mayotte, MD;  Location: [facility] END[facility];  Service: Endoscopy;  Laterality: N/A;   ESOPHAGOGASTRODUODENOSCOPY (EGD) WITH PROPOFOL N/A 09/01/2018   Procedure: ESOPHAGOGASTRODUODENOSCOPY (EGD) WITH PROPOFOL;  Surgeon: Toledo, Boykin Nearing, MD;  Location: [facility] END[facility];  Service: Gastroenterology;  Laterality: N/A;  ESOPHAGOGASTRODUODENOSCOPY (EGD) WITH PROPOFOL N/A 12/01/2018   Procedure: ESOPHAGOGASTRODUODENOSCOPY (EGD) WITH PROPOFOL;  Surgeon: Toledo, Boykin Nearing, MD;  Location: [facility];  Service: Gastroenterology;  Laterality: N/A;   ESOPHAGOGASTRODUODENOSCOPY (EGD) WITH PROPOFOL N/A 02/23/2019   Procedure: ESOPHAGOGASTRODUODENOSCOPY (EGD) WITH PROPOFOL;  Surgeon: Midge Minium, MD;  Location[facility];  Service: Endoscopy;  Laterality: N/A;   ESOPHAGOSCOPY WITH DILITATION  2012,2015   [facility] [place]   EYE SURGERY     FLEXIBLE BRONCHOSCOPY N/A 10/30/2021   Procedure: FLEXIBLE BRONCHOSCOPY;  Surgeon: Raechel Chute, MD;  Location: [facility]  ORS;  Service: Pulmonary;  Laterality: N/A;   GASTRECTOMY     gastric ulcer  1989, 1991   IR IMAGING GUIDED PORT INSERTION  11/14/2021   JOINT REPLACEMENT     right total hip arthroplasty 03/03/02   LAPAROSCOPIC LYSIS OF ADHESIONS     LUNG CANCER SURGERY  2011   LYSIS OF ADHESION     PLACEMENT OF BREAST IMPLANTS  1973   thoracotomy with right upper lobectomy and central compartment node dissection     UPPER GASTROINTESTINAL ENDOSCOPY  10-29-2008   Dr Lemar Livings   VIDEO BRONCHOSCOPY WITH ENDOBRONCHIAL ULTRASOUND N/A 10/30/2021   Procedure: VIDEO BRONCHOSCOPY WITH ENDOBRONCHIAL ULTRASOUND;  Surgeon: Raechel Chute, MD;  Location: [facility] ORS;  Service: Pulmonary;  Laterality: N/A;    Social History   Socioeconomic History   Marital status: Widowed    Spouse name: Not on file   Number of children: 2   Years of education: Not on file   Highest education level: Not on file  Occupational History   Occupation: retired    Comment: Runner, broadcasting/film/video  Tobacco Use   Smoking status: Former    Packs/day: 1.00    Years: 25.00    Additional pack years: 0.00    Total pack years: 25.00    Types: Cigarettes    Quit date: 01/30/2008    Years since quitting: 14.4   Smokeless tobacco: Never  Vaping Use   Vaping Use: Never used  Substance and Sexual Activity   Alcohol use: Yes    Comment: occ. for holiday   Drug use: No   Sexual activity: Not Currently  Other Topics Concern   Not on file  Social History Narrative   Lives alone    Social Determinants of Health   Financial Resource Strain: Not on file  Food Insecurity: No Food Insecurity (02/13/2022)   Hunger Vital Sign    Worried About Running Out of Food in the Last Year: Never true    Ran Out of Food in the Last Year: Never true  Transportation Needs: Unmet Transportation Needs (05/28/2022)   PRAPARE - Administrator, Civil Service (Medical): Yes    Lack of Transportation (Non-Medical): Yes  Physical Activity: Not on file  Stress: Not on  file  Social Connections: Not on file  Intimate Partner Violence: Not At Risk (02/13/2022)   Humiliation, Afraid, Rape, and Kick questionnaire    Fear of Current or Ex-Partner: No    Emotionally Abused: No    Physically Abused: No    Sexually Abused: No    Family History  Problem Relation Age of Onset   Hypertension Mother    Diabetes Father      Current Outpatient Medications:    acetaminophen (TYLENOL) 500 MG tablet, Take 500 mg by mouth every 6 (six) hours as needed., Disp: , Rfl:    brimonidine-timolol (COMBIGAN) 0.2-0.5 % ophthalmic solution, INT 1 GTT IN OU BID, Disp: ,  Rfl:    Cholecalciferol (VITAMIN D3) 1000 units CAPS, Take 2,000 Units by mouth daily., Disp: , Rfl:    diltiazem (CARDIZEM) 60 MG tablet, Take 1 tablet (60 mg total) by mouth every 6 (six) hours., Disp: 180 tablet, Rfl: 0   diphenoxylate-atropine (LOMOTIL) 2.5-0.025 MG tablet, Take 2 tablets by mouth 4 (four) times daily as needed for diarrhea or loose stools., Disp: 120 tablet, Rfl: 0   docusate (COLACE) 50 MG/5ML liquid, Take by mouth daily., Disp: , Rfl:    fluticasone (FLONASE) 50 MCG/ACT nasal spray, Place 2 sprays into both nostrils daily., Disp: , Rfl:    HYDROcodone-acetaminophen (NORCO/VICODIN) 5-325 MG tablet, Take 1 tablet by mouth every 6 (six) hours as needed for moderate pain., Disp: 15 tablet, Rfl: 0   Ipratropium-Albuterol (COMBIVENT) 20-100 MCG/ACT AERS respimat, Inhale 1 puff into the lungs every 6 (six) hours. For SOB, Disp: 4 g, Rfl: 1   ipratropium-albuterol (DUONEB) 0.5-2.5 (3) MG/3ML SOLN, Take 3 mLs by nebulization every 6 (six) hours., Disp: 360 mL, Rfl: 0   loperamide (IMODIUM) 2 MG capsule, Take 2 capsules (4 mg) at first loose stool then 1 capsule (2 mg) with each subsequent loose stool. Do not exceed 8 capsules (16 mg) in 24 hour period., Disp: 30 capsule, Rfl: 0   loratadine (CLARITIN) 10 MG tablet, Take 10 mg by mouth daily., Disp: , Rfl:    magnesium chloride (SLOW-MAG) 64 MG TBEC SR  tablet, Take 1 tablet (64 mg total) by mouth daily., Disp: 14 tablet, Rfl: 0   Multiple Vitamin (MULTI-VITAMINS) TABS, Take 1 tablet by mouth daily. once daily., Disp: , Rfl:    pantoprazole (PROTONIX) 40 MG tablet, Take 40 mg by mouth 2 (two) times daily., Disp: , Rfl:    potassium chloride (KLOR-CON M) 10 MEQ tablet, Take 1 tablet (10 mEq total) by mouth daily., Disp: 14 tablet, Rfl: 0   predniSONE (DELTASONE) 5 MG tablet, Take 1 tablet (5 mg total) by mouth as directed. 3 tablets daily for 7 days 2 tablets for 7 days  daily , then 1 tablet for 7 days-please take in am with food, Disp: 42 tablet, Rfl: 0   sucralfate (CARAFATE) 1 G tablet, Take 1 g by mouth 4 (four) times daily. , Disp: , Rfl:    traZODone (DESYREL) 150 MG tablet, Take 300 mg by mouth at bedtime. , Disp: , Rfl:    venlafaxine XR (EFFEXOR-XR) 150 MG 24 hr capsule, Take 150 mg by mouth daily with breakfast. , Disp: , Rfl:  No current facility-administered medications for this visit.  Facility-Administered Medications Ordered in Other Visits:    heparin lock flush 100 UNIT/ML injection, , , ,   Physical exam:  Vitals:   07/09/22 1309  BP: 132/75  Pulse: 99  Resp: 18  Temp: 98 F (36.7 C)  TempSrc: Tympanic  SpO2: 91%  Weight: 69 lb (31.3 kg)  Height: 5' (1.524 m)   Physical Exam Constitutional:      Comments: Ambulates with a walker.  Appears in no acute distress  Cardiovascular:     Rate and Rhythm: Normal rate and regular rhythm.     Heart sounds: Normal heart sounds.  Pulmonary:     Effort: Pulmonary effort is normal.     Breath sounds: Normal breath sounds.  Abdominal:     General: Bowel sounds are normal.     Palpations: Abdomen is soft.  Skin:    General: Skin is warm and dry.  Neurological:  Mental Status: She is alert and oriented to person, place, and time.         Latest Ref Rng & Units 06/22/2022    1:29 PM  CMP  Glucose 70 - 99 mg/dL 865   BUN 8 - 23 mg/dL 43   Creatinine 7.84 - 1.00  mg/dL 6.96   Sodium 295 - 284 mmol/L 137   Potassium 3.5 - 5.1 mmol/L 3.5   Chloride 98 - 111 mmol/L 105   CO2 22 - 32 mmol/L 24   Calcium 8.9 - 10.3 mg/dL 8.4       Latest Ref Rng & Units 05/30/2022   12:58 PM  CBC  WBC 4.0 - 10.5 K/uL 8.5   Hemoglobin 12.0 - 15.0 g/dL 13.2   Hematocrit 44.0 - 46.0 % 37.1   Platelets 150 - 400 K/uL 271      Assessment and plan- Patient is a 77 y.o. female with history of non-small cell lung cancer involving the right middle lobe stage IV T1b N2 M1.  She is s/p concurrent chemoradiation with weekly CarboTaxol.  She is s/p 2 cycles of maintenance durvalumab .  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  This is a routine follow-up visit  Acute left chest wall pain: Etiology seems to be musculoskeletal.  Pain is worsened upon movements.  I will obtain a plain chest x-ray today.  I will prescribe her a weeks course of Robaxin.  She will call us and let us know if her pain does not get any better.  I will see her back in 1 month for possible IV fluids  Hypokalemia: 1 L of IV fluids with 20 mEq of IV potassium today  Lung cancer: I will be considering repeat scans sometime around September 2024 but sooner if her chest wall pain is no better.  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 Encounter for follow-up surveillance of lung cancer   2. Chest wall pain   3. Colitis      Dr. Owens Shark, MD, MPH [facility] [phone] 07/09/2022 1:07 PM

## 2022-07-11 ENCOUNTER — Ambulatory Visit: Payer: Medicare Other | Admitting: Oncology

## 2022-07-11 ENCOUNTER — Other Ambulatory Visit: Payer: Medicare Other

## 2022-07-11 ENCOUNTER — Ambulatory Visit: Payer: Medicare Other

## 2022-08-01 ENCOUNTER — Other Ambulatory Visit: Payer: Medicare Other

## 2022-08-01 ENCOUNTER — Other Ambulatory Visit: Payer: Self-pay | Admitting: *Deleted

## 2022-08-01 ENCOUNTER — Telehealth: Payer: Self-pay | Admitting: *Deleted

## 2022-08-01 ENCOUNTER — Other Ambulatory Visit: Payer: Self-pay

## 2022-08-01 ENCOUNTER — Encounter: Payer: Medicare Other | Admitting: Medical Oncology

## 2022-08-01 DIAGNOSIS — C342 Malignant neoplasm of middle lobe, bronchus or lung: Secondary | ICD-10-CM

## 2022-08-01 DIAGNOSIS — R058 Other specified cough: Secondary | ICD-10-CM

## 2022-08-01 DIAGNOSIS — R0602 Shortness of breath: Secondary | ICD-10-CM

## 2022-08-01 NOTE — Telephone Encounter (Signed)
Patient called reporting that she has severe diarrhea, when questioned for more information, she reports that for past 10 days she has had watery to mushy stools 4 times usually first of the morning.Reports abdominal cramping with this too. She is using Lomotil for it and is in need of a refill and is asking for a GI referral for it. She also reports that she has been coughing and brings up a light green mucous and she is having shortness of breath too intermittently that strike at rest or during activity for the past week. She reports that she is having left mid back pain and the Hydrocodone she has been using is really not relieving the pain and she needs refill or something stronger. She denies fevers, edema, nausea or vomiting. Please advise

## 2022-08-01 NOTE — Telephone Encounter (Signed)
I called pt and asked if she could come in but quickly to come here. She says 30 min. On the way , the only PA here has an issue with her  daughter and has to go get her now. So we wanted pt to go the [facility] urgent care. I apoligize and I called urgent care and at [facility] have 30 min. Waiting and pt ok with this and I was so sorry that the emergency came up. Pt ok

## 2022-08-06 ENCOUNTER — Encounter: Payer: Self-pay | Admitting: Hospice and Palliative Medicine

## 2022-08-06 ENCOUNTER — Ambulatory Visit
Admission: RE | Admit: 2022-08-06 | Discharge: 2022-08-06 | Disposition: A | Discharge status: Home / Self Care | Payer: Medicare Other | Source: Ambulatory Visit | Attending: Hospice and Palliative Medicine | Admitting: Hospice and Palliative Medicine

## 2022-08-06 ENCOUNTER — Telehealth: Payer: Self-pay | Admitting: *Deleted

## 2022-08-06 ENCOUNTER — Inpatient Hospital Stay: Payer: Medicare Other

## 2022-08-06 ENCOUNTER — Telehealth: Payer: Self-pay | Admitting: Oncology

## 2022-08-06 ENCOUNTER — Inpatient Hospital Stay (HOSPITAL_BASED_OUTPATIENT_CLINIC_OR_DEPARTMENT_OTHER): Payer: Medicare Other | Admitting: Hospice and Palliative Medicine

## 2022-08-06 ENCOUNTER — Inpatient Hospital Stay: Payer: Medicare Other | Attending: Oncology

## 2022-08-06 ENCOUNTER — Other Ambulatory Visit: Payer: Self-pay

## 2022-08-06 ENCOUNTER — Encounter: Payer: Self-pay | Admitting: *Deleted

## 2022-08-06 VITALS — BP 120/71 | HR 84 | Temp 99.0°F | Resp 20 | Ht 60.0 in | Wt <= 1120 oz

## 2022-08-06 DIAGNOSIS — R058 Other specified cough: Secondary | ICD-10-CM | POA: Insufficient documentation

## 2022-08-06 DIAGNOSIS — Z79899 Other long term (current) drug therapy: Secondary | ICD-10-CM | POA: Insufficient documentation

## 2022-08-06 DIAGNOSIS — R197 Diarrhea, unspecified: Secondary | ICD-10-CM

## 2022-08-06 DIAGNOSIS — J40 Bronchitis, not specified as acute or chronic: Secondary | ICD-10-CM | POA: Insufficient documentation

## 2022-08-06 DIAGNOSIS — E860 Dehydration: Secondary | ICD-10-CM

## 2022-08-06 DIAGNOSIS — C342 Malignant neoplasm of middle lobe, bronchus or lung: Secondary | ICD-10-CM | POA: Insufficient documentation

## 2022-08-06 DIAGNOSIS — R112 Nausea with vomiting, unspecified: Secondary | ICD-10-CM

## 2022-08-06 DIAGNOSIS — D801 Nonfamilial hypogammaglobulinemia: Secondary | ICD-10-CM | POA: Insufficient documentation

## 2022-08-06 DIAGNOSIS — K5090 Crohn's disease, unspecified, without complications: Secondary | ICD-10-CM | POA: Diagnosis not present

## 2022-08-06 DIAGNOSIS — R0602 Shortness of breath: Secondary | ICD-10-CM

## 2022-08-06 DIAGNOSIS — E876 Hypokalemia: Secondary | ICD-10-CM | POA: Insufficient documentation

## 2022-08-06 DIAGNOSIS — R0789 Other chest pain: Secondary | ICD-10-CM | POA: Diagnosis not present

## 2022-08-06 DIAGNOSIS — R062 Wheezing: Secondary | ICD-10-CM

## 2022-08-06 DIAGNOSIS — K529 Noninfective gastroenteritis and colitis, unspecified: Secondary | ICD-10-CM | POA: Diagnosis not present

## 2022-08-06 DIAGNOSIS — R051 Acute cough: Secondary | ICD-10-CM

## 2022-08-06 LAB — CMP (CANCER CENTER ONLY)
ALT: 24 U/L (ref 0–44)
AST: 24 U/L (ref 15–41)
Albumin: 3.5 g/dL (ref 3.5–5.0)
Alkaline Phosphatase: 88 U/L (ref 38–126)
Anion gap: 9 (ref 5–15)
BUN: 26 mg/dL — ABNORMAL HIGH (ref 8–23)
CO2: 24 mmol/L (ref 22–32)
Calcium: 8.7 mg/dL — ABNORMAL LOW (ref 8.9–10.3)
Chloride: 104 mmol/L (ref 98–111)
Creatinine: 0.56 mg/dL (ref 0.44–1.00)
GFR, Estimated: >60 mL/min (ref >60)
Glucose, Bld: 146 mg/dL — ABNORMAL HIGH (ref 70–99)
Potassium: 2.8 mmol/L — ABNORMAL LOW (ref 3.5–5.1)
Sodium: 137 mmol/L (ref 135–145)
Total Bilirubin: 0.2 mg/dL — ABNORMAL LOW (ref 0.3–1.2)
Total Protein: 5.9 g/dL — ABNORMAL LOW (ref 6.5–8.1)

## 2022-08-06 LAB — CBC WITH DIFFERENTIAL (CANCER CENTER ONLY)
Abs Immature Granulocytes: 0.18 10*3/uL — ABNORMAL HIGH (ref 0.00–0.07)
Basophils Absolute: 0.1 10*3/uL (ref 0.0–0.1)
Basophils Relative: 0 %
Eosinophils Absolute: 0 10*3/uL (ref 0.0–0.5)
Eosinophils Relative: 0 %
HCT: 44.4 % (ref 36.0–46.0)
Hemoglobin: 14.5 g/dL (ref 12.0–15.0)
Immature Granulocytes: 1 %
Lymphocytes Relative: 7 %
Lymphs Abs: 1.4 10*3/uL (ref 0.7–4.0)
MCH: 30.4 pg (ref 26.0–34.0)
MCHC: 32.7 g/dL (ref 30.0–36.0)
MCV: 93.1 fL (ref 80.0–100.0)
Monocytes Absolute: 2.4 10*3/uL — ABNORMAL HIGH (ref 0.1–1.0)
Monocytes Relative: 11 %
Neutro Abs: 16.9 10*3/uL — ABNORMAL HIGH (ref 1.7–7.7)
Neutrophils Relative %: 81 %
Platelet Count: 244 10*3/uL (ref 150–400)
RBC: 4.77 MIL/uL (ref 3.87–5.11)
RDW: 15.1 % (ref 11.5–15.5)
WBC Count: 21 10*3/uL — ABNORMAL HIGH (ref 4.0–10.5)
nRBC: 0 % (ref 0.0–0.2)

## 2022-08-06 LAB — RESPIRATORY PANEL BY PCR

## 2022-08-06 LAB — MAGNESIUM: Magnesium: 1.7 mg/dL (ref 1.7–2.4)

## 2022-08-06 MED ORDER — OXYCODONE-ACETAMINOPHEN 5-325 MG PO TABS: 1.0000 | ORAL_TABLET | Freq: Three times a day (TID) | ORAL | 0 refills | Status: DC | PRN

## 2022-08-06 MED ORDER — DIPHENOXYLATE-ATROPINE 2.5-0.025 MG PO TABS: 2.0000 | ORAL_TABLET | Freq: Four times a day (QID) | ORAL | 0 refills | Status: DC | PRN

## 2022-08-06 MED ORDER — POTASSIUM CHLORIDE CRYS ER 20 MEQ PO TBCR: 20.0000 meq | EXTENDED_RELEASE_TABLET | Freq: Two times a day (BID) | ORAL | 0 refills | Status: DC

## 2022-08-06 MED ORDER — POTASSIUM CHLORIDE 20 MEQ/100ML IV SOLN
20.0000 meq | Freq: Once | INTRAVENOUS | Status: AC
  Administered 2022-08-06: 20 meq via INTRAVENOUS

## 2022-08-06 MED ORDER — SODIUM CHLORIDE 0.9 % IV SOLN
INTRAVENOUS | Status: DC
  Filled 2022-08-06 (×2): qty 250

## 2022-08-06 MED ORDER — IPRATROPIUM-ALBUTEROL 0.5-2.5 (3) MG/3ML IN SOLN: 3.0000 mL | Freq: Four times a day (QID) | RESPIRATORY_TRACT | 2 refills | Status: DC | PRN

## 2022-08-06 MED ORDER — IOHEXOL 300 MG/ML  SOLN
50.0000 mL | Freq: Once | INTRAMUSCULAR | Status: AC | PRN
  Administered 2022-08-06: 50 mL via INTRAVENOUS

## 2022-08-06 MED ORDER — AMOXICILLIN-POT CLAVULANATE 875-125 MG PO TABS: 1.0000 | ORAL_TABLET | Freq: Two times a day (BID) | ORAL | 0 refills | Status: DC

## 2022-08-06 MED ORDER — HEPARIN SOD (PORK) LOCK FLUSH 100 UNIT/ML IV SOLN
500.0000 [IU] | Freq: Once | INTRAVENOUS | Status: AC
  Administered 2022-08-06: 500 [IU] via INTRAVENOUS
  Filled 2022-08-06: qty 5

## 2022-08-06 NOTE — Progress Notes (Signed)
Symptom Management Clinic [facility] Telephone:([phone] Fax:([phone]) [phone]  Patient Care Team: Jaclyn Shaggy, MD as PCP - General (Internal Medicine) Derwood Kaplan, MD (Internal Medicine) Lemar Livings, Merrily Pew, MD (General Surgery) Glory Buff, RN as Oncology Nurse Navigator Carmina Miller, MD as Referring Physician (Radiation Oncology) Creig Hines, MD as Consulting Physician (Oncology)   NAME OF PATIENT: Carol Shaffer  [id number]  1945/06/23   DATE OF VISIT: 08/06/22  REASON FOR CONSULT: DEAHNA MINOTTI is a 77 y.o. female with multiple medical problems including stage IV non-small cell lung cancer.  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  INTERVAL HISTORY: Patient was last seen by Dr. Smith Robert on 07/09/2022 at which time patient was complaining of left chest wall pain, which appear to be musculoskeletal in etiology.  Patient was seen in urgent care on 08/01/2022 with complaint of 2 weeks of diarrhea, productive cough.  She was sent for chest x-ray, which reportedly showed bronchitis.  Patient was started on prednisone taper and Levaquin.  She presents to clinic today for follow-up.  She says that she continues to have diarrhea, and had an episode of vomiting this morning.  She feels like her stomach is tight.  She also continues to endorse productive cough and wheezing.  Denies fever or chills.  She has tenderness left posterior ribs.  She has been taking Percocet with some improvement.  Patient says she does not feel like she had any improvement with course of steroids and Levaquin.  Denies any neurologic complaints. Denies recent fevers or illnesses. Denies any easy bleeding or bruising.  Denies urinary complaints. Patient offers no further specific complaints today.  PAST MEDICAL HISTORY: Past Medical History:  Diagnosis Date   Arthritis     hands   Avascular necrosis of hip, right (HCC)    Blepharospasm    Cancer (HCC) 2011   Right Upper Lobe Lobectomy   Chronic diarrhea    Chronic diarrhea    Collagenous colitis    Complication of anesthesia    usually wakes up during procedures  (endoscopy and colonoscopy)   COPD (chronic obstructive pulmonary disease) (HCC)    COVID 2022   Depression    Diverticulosis    Gastric outlet obstruction    Headache    every couple of days   Hemorrhoid    History of Crohn's disease    Hypoglycemic disorder    IDA (iron deficiency anemia)    Intestinal adhesions    Multiple gastric ulcers    Multiple gastric ulcers    Nausea and vomiting 07/03/2016   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HCC)    Stroke (HCC) 8 yrs ago   double vision left eye    PAST SURGICAL HISTORY:  Past Surgical History:  Procedure Laterality Date   APPENDECTOMY  1989   botox injections for blepharospasm     BREAST SURGERY     CATARACT EXTRACTION     COLON RESECTION  2005   COLONOSCOPY  10-29-2008   Dr Lemar Livings   COLONOSCOPY WITH PROPOFOL N/A 03/05/2017   Procedure: COLONOSCOPY WITH PROPOFOL;  Surgeon: Toledo, Boykin Nearing, MD;  Location: [facility] END[facility];  Service: Gastroenterology;  Laterality: N/A;   COLOSTOMY REVERSAL  2006   ESOPHAGOGASTRODUODENOSCOPY (EGD) WITH PROPOFOL N/A 07/18/2016   Procedure: ESOPHAGOGASTRODUODENOSCOPY (EGD) WITH PROPOFOL;  Surgeon: Earline Mayotte, MD;  Location: [facility] END[facility];  Service: Endoscopy;  Laterality: N/A;  ESOPHAGOGASTRODUODENOSCOPY (EGD) WITH PROPOFOL N/A 08/03/2016   Procedure: ESOPHAGOGASTRODUODENOSCOPY (EGD) WITH PROPOFOL;  Surgeon: Earline Mayotte, MD;  Location: [facility];  Service: Endoscopy;  Laterality: N/A;   ESOPHAGOGASTRODUODENOSCOPY (EGD) WITH PROPOFOL N/A 09/01/2018   Procedure: ESOPHAGOGASTRODUODENOSCOPY (EGD) WITH PROPOFOL;  Surgeon: Toledo, Boykin Nearing, MD;  Location: [facility];  Service: Gastroenterology;  Laterality: N/A;    ESOPHAGOGASTRODUODENOSCOPY (EGD) WITH PROPOFOL N/A 12/01/2018   Procedure: ESOPHAGOGASTRODUODENOSCOPY (EGD) WITH PROPOFOL;  Surgeon: Toledo, Boykin Nearing, MD;  Location: [facility];  Service: Gastroenterology;  Laterality: N/A;   ESOPHAGOGASTRODUODENOSCOPY (EGD) WITH PROPOFOL N/A 02/23/2019   Procedure: ESOPHAGOGASTRODUODENOSCOPY (EGD) WITH PROPOFOL;  Surgeon: Midge Minium, MD;  Location[facility];  Service: Endoscopy;  Laterality: N/A;   ESOPHAGOSCOPY WITH DILITATION  2012,2015   [facility] [place]   EYE SURGERY     FLEXIBLE BRONCHOSCOPY N/A 10/30/2021   Procedure: FLEXIBLE BRONCHOSCOPY;  Surgeon: Raechel Chute, MD;  Location: [facility];  Service: Pulmonary;  Laterality: N/A;   GASTRECTOMY     gastric ulcer  1989, 1991   IR IMAGING GUIDED PORT INSERTION  11/14/2021   JOINT REPLACEMENT     right total hip arthroplasty 03/03/02   LAPAROSCOPIC LYSIS OF ADHESIONS     LUNG CANCER SURGERY  2011   LYSIS OF ADHESION     PLACEMENT OF BREAST IMPLANTS  1973   thoracotomy with right upper lobectomy and central compartment node dissection     UPPER GASTROINTESTINAL ENDOSCOPY  10-29-2008   Dr Lemar Livings   VIDEO BRONCHOSCOPY WITH ENDOBRONCHIAL ULTRASOUND N/A 10/30/2021   Procedure: VIDEO BRONCHOSCOPY WITH ENDOBRONCHIAL ULTRASOUND;  Surgeon: Raechel Chute, MD;  Location: [facility];  Service: Pulmonary;  Laterality: N/A;    HEMATOLOGY/ONCOLOGY HISTORY:  Oncology History  Non-small cell cancer of middle lobe of right lung (HCC)  06/29/2019 Initial Diagnosis   Non-small cell cancer of middle lobe of right lung (HCC)   11/03/2021 Cancer Staging   Staging form: Lung, AJCC 8th Edition - Clinical stage from 11/03/2021: Stage IV (cT1b, cN2, cM1) - Signed by Creig Hines, MD on 11/13/2021 Histopathologic type: Adenocarcinoma, NOS   11/27/2021 - 01/08/2022 Chemotherapy   Patient is on Treatment Plan : LUNG Carboplatin + Paclitaxel + XRT q7d     03/12/2022 - 04/09/2022 Chemotherapy   Patient is on Treatment  Plan : LUNG NSCLC Durvalumab (1500) q28d       ALLERGIES:  is allergic to hydromorphone hcl, bentyl [dicyclomine], biaxin [clarithromycin], budesonide, erythromycin, and reglan [metoclopramide].  MEDICATIONS:  Current Outpatient Medications  Medication Sig Dispense Refill   acetaminophen (TYLENOL) 500 MG tablet Take 500 mg by mouth every 6 (six) hours as needed.     brimonidine-timolol (COMBIGAN) 0.2-0.5 % ophthalmic solution INT 1 GTT IN OU BID     Cholecalciferol (VITAMIN D3) 1000 units CAPS Take 2,000 Units by mouth daily.     diltiazem (CARDIZEM) 60 MG tablet Take 1 tablet (60 mg total) by mouth every 6 (six) hours. 180 tablet 0   fluticasone (FLONASE) 50 MCG/ACT nasal spray Place 2 sprays into both nostrils daily.     Ipratropium-Albuterol (COMBIVENT) 20-100 MCG/ACT AERS respimat Inhale 1 puff into the lungs every 6 (six) hours. For SOB 4 g 1   ipratropium-albuterol (DUONEB) 0.5-2.5 (3) MG/3ML SOLN Take 3 mLs by nebulization every 6 (six) hours. 360 mL 0   loperamide (IMODIUM) 2 MG capsule Take 2 capsules (4 mg) at first loose stool then 1 capsule (2 mg) with each subsequent loose stool. Do not exceed 8 capsules (16  mg) in 24 hour period. 30 capsule 0   loratadine (CLARITIN) 10 MG tablet Take 10 mg by mouth daily.     magnesium chloride (SLOW-MAG) 64 MG TBEC SR tablet Take 1 tablet (64 mg total) by mouth daily. 14 tablet 0   Multiple Vitamin (MULTI-VITAMINS) TABS Take 1 tablet by mouth daily. once daily.     pantoprazole (PROTONIX) 40 MG tablet Take 40 mg by mouth 2 (two) times daily.     potassium chloride (KLOR-CON M) 10 MEQ tablet Take 1 tablet (10 mEq total) by mouth daily. 14 tablet 0   promethazine-dextromethorphan (PROMETHAZINE-DM) 6.25-15 MG/5ML syrup Take 5 mLs by mouth at bedtime.     sucralfate (CARAFATE) 1 G tablet Take 1 g by mouth 4 (four) times daily.      traZODone (DESYREL) 150 MG tablet Take 300 mg by mouth at bedtime.      venlafaxine XR (EFFEXOR-XR) 150 MG 24 hr  capsule Take 150 mg by mouth daily with breakfast.      diphenoxylate-atropine (LOMOTIL) 2.5-0.025 MG tablet Take 2 tablets by mouth 4 (four) times daily as needed for diarrhea or loose stools. (Patient not taking: Reported on 08/06/2022) 120 tablet 0   docusate (COLACE) 50 MG/5ML liquid Take by mouth daily. (Patient not taking: Reported on 08/06/2022)     oxyCODONE-acetaminophen (PERCOCET/ROXICET) 5-325 MG tablet Take 1 tablet by mouth every 8 (eight) hours as needed for severe pain or moderate pain. (Patient not taking: Reported on 08/06/2022)     No current facility-administered medications for this visit.   Facility-Administered Medications Ordered in Other Visits  Medication Dose Route Frequency Provider Last Rate Last Admin   heparin lock flush 100 UNIT/ML injection             VITAL SIGNS: BP 120/71   Pulse (!) 118   Temp 99 F (37.2 C) (Oral)   Resp 20   Ht 5' (1.524 m)   Wt 66 lb 6.4 oz (30.1 kg)   BMI 12.97 kg/m  Filed Weights   08/06/22 1131  Weight: 66 lb 6.4 oz (30.1 kg)    Estimated body mass index is 12.97 kg/m as calculated from the following:   Height as of this encounter: 5' (1.524 m).   Weight as of this encounter: 66 lb 6.4 oz (30.1 kg).  LABS: CBC:    Component Value Date/Time   WBC 21.0 (H) 08/06/2022 1102   WBC 9.9 07/09/2022 1243   HGB 14.5 08/06/2022 1102   HCT 44.4 08/06/2022 1102   PLT 244 08/06/2022 1102   MCV 93.1 08/06/2022 1102   NEUTROABS 16.9 (H) 08/06/2022 1102   LYMPHSABS 1.4 08/06/2022 1102   MONOABS 2.4 (H) 08/06/2022 1102   EOSABS 0.0 08/06/2022 1102   BASOSABS 0.1 08/06/2022 1102   Comprehensive Metabolic Panel:    Component Value Date/Time   NA 136 07/09/2022 1243   K 3.3 (L) 07/09/2022 1243   CL 105 07/09/2022 1243   CO2 24 07/09/2022 1243   BUN 38 (H) 07/09/2022 1243   CREATININE 0.67 07/09/2022 1243   CREATININE 0.57 04/19/2022 1349   CREATININE 0.88 12/24/2011 0835   GLUCOSE 165 (H) 07/09/2022 1243   CALCIUM 8.0 (L)  07/09/2022 1243   AST 34 05/30/2022 1258   AST 30 04/19/2022 1349   ALT 52 (H) 05/30/2022 1258   ALT 26 04/19/2022 1349   ALKPHOS 71 05/30/2022 1258   BILITOT 0.4 05/30/2022 1258   BILITOT 0.4 04/19/2022 1349   PROT 5.5 (L)  05/30/2022 1258   ALBUMIN 3.3 (L) 05/30/2022 1258    RADIOGRAPHIC STUDIES: DG Chest 2 View  Result Date: 07/15/2022 CLINICAL DATA:  Non-small-cell cancer. EXAM: CHEST - 2 VIEW COMPARISON:  Chest radiograph 02/25/2022; CT chest 05/24/2022 FINDINGS: Port-A-Cath tip projects over the superior vena cava. Stable cardiomegaly. Left lung is clear. Postsurgical and treatment changes within the right lung and perihilar right hemithorax. No definite new superimposed area of consolidation. No pleural effusion or pneumothorax. Redemonstrated compression deformity of the T7 vertebral body. IMPRESSION: Postsurgical and treatment changes within the right lung. No definite new area of consolidation. Electronically Signed   By: Annia Belt M.D.   On: 07/15/2022 16:42    PERFORMANCE STATUS (ECOG) : 2 - Symptomatic, <50% confined to bed  Review of Systems Unless otherwise noted, a complete review of systems is negative.  Physical Exam General: NAD Cardiovascular: regular rate and rhythm Pulmonary: clear ant fields Abdomen: soft, nontender, + bowel sounds GU: no suprapubic tenderness Extremities: no edema, no joint deformities Skin: no rashes Neurological: Weakness but otherwise nonfocal  IMPRESSION/PLAN: Stage IV NSLC -currently off treatment given history of autoimmune colitis.  Last CTs were in April 2024.  Will repeat CTs.  Diarrhea -unclear etiology.  Patient has history of collagenous colitis for which she was followed by Dr. Servando Snare in the past.  She last saw him in 2020.  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  Patient was on antibiotics  but diarrhea predated course of Levaquin.  Will send for GI studies including checking for C. difficile and fecal calprotectin.  Patient taking Imodium and Lomotil.  Discussed with Dr. Smith Robert and will refer back to GI.  Will also obtain CT for further evaluation.  Hypokalemia -replete with IV KCl and start on oral supplementation.  Will recheck labs later this week.  Bronchitis -no significant improvement on steroids and Levaquin.  Patient is course with expiratory wheezing, worse on the right side.  Will extend course of antibiotic  - start Augmentin.  Will rotate to DuoNebs as patient having difficulty using inhaler.  Obtain CT for further evaluation.  Chest wall pain -patient tender posterior ribs.  Refill Percocet.  Obtain CT for further evaluation.  RTC later this week for labs/fluids/possible potassium.  Case and plan discussed with Dr. Smith Robert   Patient expressed understanding and was in agreement with this plan. She also understands that She can call clinic at any time with any questions, concerns, or complaints.   Thank you for allowing me to participate in the care of this very pleasant patient.   Time Total: 25 minutes  Visit consisted of counseling and education dealing with the complex and emotionally intense issues of symptom management in the setting of serious illness.Greater than 50%  of this time was spent counseling and coordinating care related to the above assessment and plan.  Signed by: Laurette Schimke, PhD, NP-C

## 2022-08-06 NOTE — Progress Notes (Signed)
CT pre ordering questions Does the patient have history of allergic reaction during injection of intravenous contrast? - no is the patient able to get on the exam table without assistance? - yes how much does the patient weigh? 66.4 pounds has the patient had a bun and creatine lab work.- YES Is the patient diabetic?- NO does the patient have a history of hypertension requiring medication therapy.- NO does the patient have any type of medical device spinal stimulator bladder stimulator pain pump glucose monitor own body injector brain aneurysm clips or any other device or stimulator.- NO has the patient been diagnosed with COVID-19 awaiting results for COVID-19 has the patient been in close contact of living with someone with a confirmed diagnosis of COVID-19?- NO  Additional Ct questions Age less than 16- NO Weight <120 lbs-  66.4 pounds Abdominal pelvic surgery in last 6 weeks- NO Prior Bariatric Surgery- NO Clinical concerns for abscess, inflammatory bowel disease, including Chrohn disease- YES- pt has colitis History of ovarian cancer, colon cancer, or lymphoma- NO

## 2022-08-06 NOTE — Patient Instructions (Signed)
Hypokalemia Hypokalemia means that the amount of potassium in the blood is lower than normal. Potassium is a mineral (electrolyte) that helps regulate the amount of fluid in the body. It also stimulates muscle tightening (contraction) and helps nerves work properly. Normally, most of the body's potassium is inside cells, and only a very small amount is in the blood. Because the amount in the blood is so small, minor changes to potassium levels in the blood can be life-threatening. What are the causes? This condition may be caused by: Antibiotic medicine. Diarrhea or vomiting. Taking too much of a medicine that helps you have a bowel movement (laxative) can cause diarrhea and lead to hypokalemia. Chronic kidney disease (CKD). Medicines that help the body get rid of excess fluid (diuretics). Eating disorders, such as anorexia or bulimia. Low magnesium levels in the body. Sweating a lot. What are the signs or symptoms? Symptoms of this condition include: Weakness. Constipation. Fatigue. Muscle cramps. Mental confusion. Skipped heartbeats or irregular heartbeat (palpitations). Tingling or numbness. How is this diagnosed? This condition is diagnosed with a blood test. How is this treated? This condition may be treated by: Taking potassium supplements. Adjusting the medicines that you take. Eating more foods that contain a lot of potassium. If your potassium level is very low, you may need to get potassium through an IV and be monitored in the hospital. Follow these instructions at home: Eating and drinking  Eat a healthy diet. A healthy diet includes fresh fruits and vegetables, whole grains, healthy fats, and lean proteins. If told, eat more foods that contain a lot of potassium. These include: Nuts, such as peanuts and pistachios. Seeds, such as sunflower seeds and pumpkin seeds. Peas, lentils, and lima beans. Whole grain and bran cereals and breads. Fresh fruits and vegetables,  such as apricots, avocado, bananas, cantaloupe, kiwi, oranges, tomatoes, asparagus, and potatoes. Juices, such as orange, tomato, and prune. Lean meats, including fish. Milk and milk products, such as yogurt. General instructions Take over-the-counter and prescription medicines only as told by your health care provider. This includes vitamins, natural food products, and supplements. Keep all follow-up visits. This is important. Contact a health care provider if: You have weakness that gets worse. You feel your heart pounding or racing. You vomit. You have diarrhea. You have diabetes and you have trouble keeping your blood sugar in your target range. Get help right away if: You have chest pain. You have shortness of breath. You have vomiting or diarrhea that lasts for more than 2 days. You faint. These symptoms may be an emergency. Get help right away. Call 911. Do not wait to see if the symptoms will go away. Do not drive yourself to the hospital. Summary Hypokalemia means that the amount of potassium in the blood is lower than normal. This condition is diagnosed with a blood test. Hypokalemia may be treated by taking potassium supplements, adjusting the medicines that you take, or eating more foods that are high in potassium. If your potassium level is very low, you may need to get potassium through an IV and be monitored in the hospital. This information is not intended to replace advice given to you by your health care provider. Make sure you discuss any questions you have with your health care provider. Document Revised: 09/29/2020 Document Reviewed: 09/29/2020 Elsevier Patient Education  2024 [employer] Inc[employer]  

## 2022-08-06 NOTE — Telephone Encounter (Signed)
Patient called reporting she is having diarrhea for 12 days mushy and watery both consistencies nothing makes it worse or better She is taking Imodium for 8 days with no improvement she takes in morning and at night. Has now developed vomiting this morning twice she has not taken her antiemetic and states that she is not nauseated at the moment. Reports that her abdominal is swelled up and she feels bloated. She has been able to drink and has been forcing herself to eat small amount of food. No fever but has chills off and on all day. She is asking to come in to e evaluated for this.   Of note she was seen at [facility] for this on 7/3: Diagnoses and all orders for this visit:  Bronchitis - X-ray chest PA and lateral  Acute cough - X-ray chest PA and lateral - Basic Metabolic Panel (BMP) - CBC w/auto Differential (5 Part)  Diarrhea, unspecified type - Basic Metabolic Panel (BMP) - CBC w/auto Differential (5 Part)  Other orders - levoFLOXacin (LEVAQUIN) 500 MG tablet; Take 1 tablet (500 mg total) by mouth once daily for 5 days - promethazine-dextromethorphan (PROMETHAZINE-DM) 6.25-15 mg/5 mL syrup; Take 5 mLs by mouth at bedtime - predniSONE (DELTASONE) 20 MG tablet; Take 1 tablet (20 mg total) by mouth once daily for 5 days - oxyCODONE-acetaminophen (PERCOCET) 5-325 mg tablet; Take 1 tablet by mouth every 8 (eight) hours as needed for Pain for up to 5 doses  I was personally with the patient for > 45 minutes. More than 50% of this time was spent doing counseling, review of medical records, review of lab tests, treatment options and follow-up plans.  Patient Instructions Take medications as directed. Return to care should your symptoms not improve, or please present to the nearest Emergency Department should your symptoms change or worsen in any way.  We will call you with results of your lab work and your chest x-ray. Will call you on Friday. We are closed tomorrow. If you have access to  MyChart, you may check it on your own. However, you will still receive a phone call from [place]. Please follow-up with your regular doctors as soon as you can.  Portions of this note were created using dictation software and may contain typographical errors.  Patient received an After Visit Summary   Electronically signed by Marilynn Rail., MD at 08/02/2022 1:00 PM EDT  Miscellaneous Notes - documented in this encounter Table of Contents for Miscellaneous Notes  Result Encounter Note - May, Sarah J, RN - 08/01/2022 4:00 PM EDT  Result Encounter Note - May, Sarah J, RN - 08/01/2022 4:00 PM EDT    Result Encounter Note - May, Sarah J, RN - 08/01/2022 4:00 PM EDT Formatting of this note might be different from the original. Called patient phone number to notify of electrolyte results, chest x ray, and hydration status. Patient did not answer. Electronically signed by May, Sarah J, RN at 08/01/2022 6:41 PM EDT  Back to top of Miscellaneous Notes Result Encounter Note - May, Lindalou Hose, RN - 08/01/2022 4:00 PM EDT Formatting of this note might be different from the original. I have reviewed all of your results today.  Your chest x-ray seems to be unchanged from the previous x-rays.  However, I cannot see the previous x-rays, compared to my read today, compared to the previous written report, I see no acute changes or developments.  However, as we discussed, this does not change my  management.  Continue the same plan regarding the medications.  Regarding your lab work, your CBC was normal, with no signs of active infection, and your anemia has improved significantly.  However, your chemistry panel (which shows your electrolytes, kidney function, and sugar) showed some acute abnormalities, likely related to your GI losses (diarrhea).  Your potassium is a little low.  You may eat some bananas.  It shows that you are a little "dry".  I recommend aggressive hydration to replenish the losses that you  have sustained secondary to your diarrhea.  As we discussed, please see one of your doctors on Monday, if you can.  However, if you are not feeling better, or worsen through the weekend, please present immediately to the nearest emergency department.  Talked to patient - 08/03/22 Patient states that she is feeling better and that the medications seem to be helping. Diarrhea is improving. Denies any decline. 1/2 of Percocet did not improve pain and took 1 tablet, this improved the pain. States that first thing in the morning, she still has to get her bearings before standing. Dizziness and lightheadedness has not worrsened since onset and patient states that overall she is still improving. Patient encouraged to push fluids and potassium containing fluids. Patient also encouraged to schedule a follow up appointment with her PCP on Monday, patient expressed understanding. Patient informed to go to ED if symptoms worsen over the weekend.  Maralyn Sago, RN Electronically signed by May, Sarah J, RN at 08/03/2022 11:15 AM EDT  Back to top of Miscellaneous Notes Plan of Treatment - documented as of this encounter Plan of Treatment - Pending Results Pending Results Name Type Priority Associated Diagnoses Date/Time  X-ray chest PA and lateral Imaging 1-Same Day Clinic/Patient Waiting Bronchitis  Acute cough  08/01/2022 5:34 PM EDT

## 2022-08-06 NOTE — Telephone Encounter (Signed)
Pt left VM on Friday requesting to appointment prior to 7/22. She is also requesting pain medication. Pt expressed that she needs to be see very soon.

## 2022-08-07 MED ORDER — IPRATROPIUM-ALBUTEROL 0.5-2.5 (3) MG/3ML IN SOLN: 3.0000 mL | Freq: Four times a day (QID) | RESPIRATORY_TRACT | 2 refills | Status: DC | PRN

## 2022-08-07 MED ORDER — POTASSIUM CHLORIDE CRYS ER 20 MEQ PO TBCR: 20.0000 meq | EXTENDED_RELEASE_TABLET | Freq: Two times a day (BID) | ORAL | 0 refills | Status: DC

## 2022-08-07 MED ORDER — DIPHENOXYLATE-ATROPINE 2.5-0.025 MG PO TABS: 2.0000 | ORAL_TABLET | Freq: Four times a day (QID) | ORAL | 0 refills | Status: DC | PRN

## 2022-08-07 NOTE — Addendum Note (Signed)
Addended by: Laurette Schimke R on: 08/07/2022 11:02 AM   Modules accepted: Orders

## 2022-08-08 ENCOUNTER — Inpatient Hospital Stay: Payer: Medicare Other

## 2022-08-08 VITALS — BP 104/68 | HR 93 | Temp 99.5°F

## 2022-08-08 DIAGNOSIS — E876 Hypokalemia: Secondary | ICD-10-CM

## 2022-08-08 DIAGNOSIS — R0789 Other chest pain: Secondary | ICD-10-CM

## 2022-08-08 DIAGNOSIS — R197 Diarrhea, unspecified: Secondary | ICD-10-CM

## 2022-08-08 DIAGNOSIS — Z08 Encounter for follow-up examination after completed treatment for malignant neoplasm: Secondary | ICD-10-CM

## 2022-08-08 DIAGNOSIS — C342 Malignant neoplasm of middle lobe, bronchus or lung: Secondary | ICD-10-CM

## 2022-08-08 DIAGNOSIS — K529 Noninfective gastroenteritis and colitis, unspecified: Secondary | ICD-10-CM

## 2022-08-08 DIAGNOSIS — D509 Iron deficiency anemia, unspecified: Secondary | ICD-10-CM

## 2022-08-08 LAB — COMPREHENSIVE METABOLIC PANEL
ALT: 23 U/L (ref 0–44)
AST: 23 U/L (ref 15–41)
Albumin: 3.1 g/dL — ABNORMAL LOW (ref 3.5–5.0)
Alkaline Phosphatase: 72 U/L (ref 38–126)
Anion gap: 9 (ref 5–15)
BUN: 24 mg/dL — ABNORMAL HIGH (ref 8–23)
CO2: 24 mmol/L (ref 22–32)
Calcium: 8.5 mg/dL — ABNORMAL LOW (ref 8.9–10.3)
Chloride: 102 mmol/L (ref 98–111)
Creatinine, Ser: 0.57 mg/dL (ref 0.44–1.00)
GFR, Estimated: >60 mL/min (ref >60)
Glucose, Bld: 114 mg/dL — ABNORMAL HIGH (ref 70–99)
Potassium: 3.3 mmol/L — ABNORMAL LOW (ref 3.5–5.1)
Sodium: 135 mmol/L (ref 135–145)
Total Bilirubin: 0.4 mg/dL (ref 0.3–1.2)
Total Protein: 5.8 g/dL — ABNORMAL LOW (ref 6.5–8.1)

## 2022-08-08 LAB — GASTROINTESTINAL PANEL BY PCR, STOOL (REPLACES STOOL CULTURE)

## 2022-08-08 LAB — C DIFFICILE QUICK SCREEN W PCR REFLEX
C Diff antigen: NEGATIVE
C Diff interpretation: NOT DETECTED
C Diff toxin: NEGATIVE

## 2022-08-08 LAB — CBC WITH DIFFERENTIAL/PLATELET
Abs Immature Granulocytes: 0.12 10*3/uL — ABNORMAL HIGH (ref 0.00–0.07)
Basophils Absolute: 0 10*3/uL (ref 0.0–0.1)
Basophils Relative: 0 %
Eosinophils Absolute: 0 10*3/uL (ref 0.0–0.5)
Eosinophils Relative: 0 %
HCT: 40.3 % (ref 36.0–46.0)
Hemoglobin: 13 g/dL (ref 12.0–15.0)
Immature Granulocytes: 1 %
Lymphocytes Relative: 13 %
Lymphs Abs: 1.8 10*3/uL (ref 0.7–4.0)
MCH: 30.2 pg (ref 26.0–34.0)
MCHC: 32.3 g/dL (ref 30.0–36.0)
MCV: 93.7 fL (ref 80.0–100.0)
Monocytes Absolute: 1.5 10*3/uL — ABNORMAL HIGH (ref 0.1–1.0)
Monocytes Relative: 11 %
Neutro Abs: 10.7 10*3/uL — ABNORMAL HIGH (ref 1.7–7.7)
Neutrophils Relative %: 75 %
Platelets: 210 10*3/uL (ref 150–400)
RBC: 4.3 MIL/uL (ref 3.87–5.11)
RDW: 15.1 % (ref 11.5–15.5)
WBC: 14.2 10*3/uL — ABNORMAL HIGH (ref 4.0–10.5)
nRBC: 0 % (ref 0.0–0.2)

## 2022-08-08 MED ORDER — HEPARIN SOD (PORK) LOCK FLUSH 100 UNIT/ML IV SOLN
500.0000 [IU] | Freq: Once | INTRAVENOUS | Status: AC
  Administered 2022-08-08: 500 [IU] via INTRAVENOUS
  Filled 2022-08-08: qty 5

## 2022-08-08 MED ORDER — POTASSIUM CHLORIDE 20 MEQ/100ML IV SOLN
20.0000 meq | Freq: Once | INTRAVENOUS | Status: AC
  Administered 2022-08-08: 20 meq via INTRAVENOUS

## 2022-08-08 MED ORDER — SODIUM CHLORIDE 0.9% FLUSH
10.0000 mL | Freq: Once | INTRAVENOUS | Status: AC
  Administered 2022-08-08: 10 mL via INTRAVENOUS
  Filled 2022-08-08: qty 10

## 2022-08-08 MED ORDER — SODIUM CHLORIDE 0.9 % IV SOLN
INTRAVENOUS | Status: AC
  Filled 2022-08-08 (×2): qty 250

## 2022-08-09 ENCOUNTER — Ambulatory Visit (INDEPENDENT_AMBULATORY_CARE_PROVIDER_SITE_OTHER): Payer: Medicare Other | Admitting: Student in an Organized Health Care Education/Training Program

## 2022-08-09 ENCOUNTER — Encounter: Payer: Self-pay | Admitting: Student in an Organized Health Care Education/Training Program

## 2022-08-09 VITALS — BP 118/70 | HR 87 | Temp 97.7°F | Ht 60.0 in | Wt <= 1120 oz

## 2022-08-09 DIAGNOSIS — C3431 Malignant neoplasm of lower lobe, right bronchus or lung: Secondary | ICD-10-CM | POA: Diagnosis not present

## 2022-08-09 NOTE — Progress Notes (Signed)
Assessment & Plan:   #Malignant Neoplasm of the right lung  Patient has a history of lung ca s/p right upper lobectomy. 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 She underwent SBRT and has continued to have surveillance imaging. She was noted to have narrowing of her RML bronchus and BI on chest CT and repeat bronchoscopy with EBUS noted malignant cells in the lymph nodes. Patient was unable to tolerate durvalumab given development of GI side effects.  She is presenting today for the evaluation of symptoms that include chest wall pain (posteriorly over the left, extremely tender to light touch, no rash on exam) as well as a cough productive of sputum. I have reviewed her imaging over the past year and while imaging is somewhat stable compared to CT in April, it is my impression that there is likely progression of disease overall. There appears to be further narrowing of the bronchus intermedius compared to the April 2024 image, which could be secondary to disease progression. I also suspect a component of post obstructive pneumonia secondary to this. The fluid could be secondary to lobar collapse or due to the underlying malignant process. The patient would likely benefit from consideration of radiation to affected area in the bronchus intermedius. While evaluation by interventional pulmonology at a tertiary care center (balloon dilatation +/- stenting), I believe that the location of the narrowing and patient's overall performance status makes it difficult for her to tolerate rigid bronchoscopy. Furthermore the risk of stent obstruction (granulation tissue, mucus plug) likely outweighs the benefits  Finally, I don't suspect that the process driving the patient's chest wall pain is related to her underlying malignant process and is likely musculoskeletal in origin. Exam is not suggestive of Herpes Zoster though I would recommend careful monitoring of the  area should any rash or vesicles develop.  Return if symptoms worsen or fail to improve.  I spent 30 minutes caring for this patient today, including preparing to see the patient, obtaining a medical history , reviewing a separately obtained history, performing a medically appropriate examination and/or evaluation, counseling and educating the patient/family/caregiver, referring and communicating with other health care professionals (not separately reported), documenting clinical information in the electronic health record, and independently interpreting results (not separately reported/billed) and communicating results to the patient/family/caregiver  Raechel Chute, MD [facility] 08/09/2022 4:51 PM    End of visit medications:  No orders of the defined types were placed in this encounter.    Current Outpatient Medications:    acetaminophen (TYLENOL) 500 MG tablet, Take 500 mg by mouth every 6 (six) hours as needed., Disp: , Rfl:    amoxicillin-clavulanate (AUGMENTIN) 875-125 MG tablet, Take 1 tablet by mouth 2 (two) times daily., Disp: 20 tablet, Rfl: 0   brimonidine-timolol (COMBIGAN) 0.2-0.5 % ophthalmic solution, INT 1 GTT IN OU BID, Disp: , Rfl:    Cholecalciferol (VITAMIN D3) 1000 units CAPS, Take 2,000 Units by mouth daily., Disp: , Rfl:    diltiazem (CARDIZEM) 60 MG tablet, Take 1 tablet (60 mg total) by mouth every 6 (six) hours., Disp: 180 tablet, Rfl: 0   diphenoxylate-atropine (LOMOTIL) 2.5-0.025 MG tablet, Take 2 tablets by mouth 4 (four) times daily as needed for diarrhea or loose stools., Disp: 120 tablet, Rfl: 0   fluticasone (FLONASE) 50 MCG/ACT nasal spray, Place 2 sprays into both nostrils daily., Disp: , Rfl:    Ipratropium-Albuterol (COMBIVENT) 20-100 MCG/ACT AERS respimat,  Inhale 1 puff into the lungs every 6 (six) hours. For SOB, Disp: 4 g, Rfl: 1   loperamide (IMODIUM) 2 MG capsule, Take 2 capsules (4 mg) at first loose stool then 1 capsule (2 mg)  with each subsequent loose stool. Do not exceed 8 capsules (16 mg) in 24 hour period., Disp: 30 capsule, Rfl: 0   loratadine (CLARITIN) 10 MG tablet, Take 10 mg by mouth daily., Disp: , Rfl:    magnesium chloride (SLOW-MAG) 64 MG TBEC SR tablet, Take 1 tablet (64 mg total) by mouth daily., Disp: 14 tablet, Rfl: 0   Multiple Vitamin (MULTI-VITAMINS) TABS, Take 1 tablet by mouth daily. once daily., Disp: , Rfl:    ondansetron (ZOFRAN) 8 MG tablet, Take 8 mg by mouth every 8 (eight) hours as needed for nausea or vomiting., Disp: , Rfl:    oxyCODONE-acetaminophen (PERCOCET/ROXICET) 5-325 MG tablet, Take 1 tablet by mouth every 8 (eight) hours as needed for severe pain or moderate pain., Disp: 30 tablet, Rfl: 0   pantoprazole (PROTONIX) 40 MG tablet, Take 40 mg by mouth 2 (two) times daily., Disp: , Rfl:    potassium chloride SA (KLOR-CON M) 20 MEQ tablet, Take 1 tablet (20 mEq total) by mouth 2 (two) times daily., Disp: 30 tablet, Rfl: 0   prochlorperazine (COMPAZINE) 10 MG tablet, Take 10 mg by mouth every 6 (six) hours as needed for nausea or vomiting., Disp: , Rfl:    promethazine-dextromethorphan (PROMETHAZINE-DM) 6.25-15 MG/5ML syrup, Take 5 mLs by mouth at bedtime., Disp: , Rfl:    sucralfate (CARAFATE) 1 G tablet, Take 1 g by mouth 4 (four) times daily. , Disp: , Rfl:    traZODone (DESYREL) 150 MG tablet, Take 300 mg by mouth at bedtime. , Disp: , Rfl:    venlafaxine XR (EFFEXOR-XR) 150 MG 24 hr capsule, Take 150 mg by mouth daily with breakfast. , Disp: , Rfl:    docusate (COLACE) 50 MG/5ML liquid, Take by mouth daily. (Patient not taking: Reported on 08/06/2022), Disp: , Rfl:    ipratropium-albuterol (DUONEB) 0.5-2.5 (3) MG/3ML SOLN, Take 3 mLs by nebulization every 6 (six) hours. (Patient not taking: Reported on 08/09/2022), Disp: 360 mL, Rfl: 0   ipratropium-albuterol (DUONEB) 0.5-2.5 (3) MG/3ML SOLN, Take 3 mLs by nebulization every 6 (six) hours as needed. (Patient not taking: Reported on  08/09/2022), Disp: 360 mL, Rfl: 2 No current facility-administered medications for this visit.  Facility-Administered Medications Ordered in Other Visits:    heparin lock flush 100 UNIT/ML injection, , , ,    Subjective:   PATIENT ID: Carol Shaffer GENDER: female DOB: 06/02/1945, MRN: [id number]  Chief Complaint  Patient presents with   Follow-up    No SOB. Wheezing. Cough with brown sputum.    HPI  Carol Shaffer is a pleasant 77 year old female presenting to clinic for follow up.  She has a history of NSCLCa s/p RUL resection in 2012. In 2020 and 2021, serial CT scans of the chest were noted to have an enlarging RLL nodule which was biopsied under CT guidance. Pathology had shown a non-small cell tumor consistent with adenocarcinoma. She underwent SBRT to the RLL in July of 2021. She then continued to have surveillance CT scans read as having progressive and extensive radiation changes surrounding the RLL pulmonary lesion. Further surveillance imaging read as having stable ill-defined opacity with central bronchiectasis in the right perihilar region consistent with evolving post radiation changes. August of 2023, a repeat CT of the chest  read with narrowing and possible occlusion of the RML bronchus with some narrowing of the BI.She underwent flexible bronchoscopy for evaluation of the narrowing (endobronchial biopsy was negative) and EBUS with TBNA to the lymph nodes that was positive for malignancy. She did not tolerate durvalumab given development of colitis.  Patient is reporting left sided chest wall pain posteriorly, as well as a cough. She was recently seen in urgent care and given a course of antibiotics as well as prednisone. She does endorse the productive cough and some wheezing that is at times audible.  She reports a 15 pack year smoking history.  Ancillary information including prior medications, full medical/surgical/family/social histories, and PFTs (when available) are listed  below and have been reviewed.   Review of Systems  Constitutional:  Positive for malaise/fatigue and weight loss. Negative for chills and fever.  Respiratory:  Positive for cough, sputum production, shortness of breath and wheezing.   Cardiovascular:  Positive for chest pain (back).     Objective:   Vitals:   08/09/22 1543  BP: 118/70  Pulse: 87  Temp: 97.7 F (36.5 C)  SpO2: 96%  Weight: 66 lb (29.9 kg)  Height: 5' (1.524 m)   96% on RA BMI Readings from Last 3 Encounters:  08/09/22 12.89 kg/m  08/06/22 12.97 kg/m  07/09/22 13.48 kg/m   Wt Readings from Last 3 Encounters:  08/09/22 66 lb (29.9 kg)  08/06/22 66 lb 6.4 oz (30.1 kg)  07/09/22 69 lb (31.3 kg)    Physical Exam Constitutional:      General: She is not in acute distress.    Appearance: She is ill-appearing.  Cardiovascular:     Pulses: Normal pulses.     Heart sounds: Normal heart sounds.  Pulmonary:     Comments: Left lung field is clear to auscultation Wheezing and rhonchi noted on auscultation of the right lung field Tenderness to light touch posteriorly over the mid left chest wall Skin:    Findings: No lesion or rash (no rash noted on examination of the chest wall).  Neurological:     General: No focal deficit present.     Mental Status: She is alert and oriented to person, place, and time. Mental status is at baseline.       Ancillary Information    Past Medical History:  Diagnosis Date   Arthritis    hands   Avascular necrosis of hip, right (HCC)    Blepharospasm    Cancer (HCC) 2011   Right Upper Lobe Lobectomy   Chronic diarrhea    Chronic diarrhea    Collagenous colitis    Complication of anesthesia    usually wakes up during procedures  (endoscopy and colonoscopy)   COPD (chronic obstructive pulmonary disease) (HCC)    COVID 2022   Depression    Diverticulosis    Gastric outlet obstruction    Headache    every couple of days   Hemorrhoid    History of Crohn's  disease    Hypoglycemic disorder    IDA (iron deficiency anemia)    Intestinal adhesions    Multiple gastric ulcers    Multiple gastric ulcers    Nausea and vomiting 07/03/2016   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HCC)    Stroke (HCC) 8 yrs ago   double vision left eye     Family History  Problem Relation Age of Onset   Hypertension Mother    Diabetes Father      Past Surgical History:  Procedure Laterality Date   APPENDECTOMY  1989   botox injections for blepharospasm     BREAST SURGERY     CATARACT EXTRACTION     COLON RESECTION  2005   COLONOSCOPY  10-29-2008   Dr Lemar Livings   COLONOSCOPY WITH PROPOFOL N/A 03/05/2017   Procedure: COLONOSCOPY WITH PROPOFOL;  Surgeon: Toledo, Boykin Nearing, MD;  Location: [facility];  Service: Gastroenterology;  Laterality: N/A;   COLOSTOMY REVERSAL  2006   ESOPHAGOGASTRODUODENOSCOPY (EGD) WITH PROPOFOL N/A 07/18/2016   Procedure: ESOPHAGOGASTRODUODENOSCOPY (EGD) WITH PROPOFOL;  Surgeon: Earline Mayotte, MD;  Location: [facility];  Service: Endoscopy;  Laterality: N/A;   ESOPHAGOGASTRODUODENOSCOPY (EGD) WITH PROPOFOL N/A 08/03/2016   Procedure: ESOPHAGOGASTRODUODENOSCOPY (EGD) WITH PROPOFOL;  Surgeon: Earline Mayotte, MD;  Location: [facility];  Service: Endoscopy;  Laterality: N/A;   ESOPHAGOGASTRODUODENOSCOPY (EGD) WITH PROPOFOL N/A 09/01/2018   Procedure: ESOPHAGOGASTRODUODENOSCOPY (EGD) WITH PROPOFOL;  Surgeon: Toledo, Boykin Nearing, MD;  Location: [facility];  Service: Gastroenterology;  Laterality: N/A;   ESOPHAGOGASTRODUODENOSCOPY (EGD) WITH PROPOFOL N/A 12/01/2018   Procedure: ESOPHAGOGASTRODUODENOSCOPY (EGD) WITH PROPOFOL;  Surgeon: Toledo, Boykin Nearing, MD;  Location: [facility];  Service: Gastroenterology;  Laterality: N/A;   ESOPHAGOGASTRODUODENOSCOPY (EGD) WITH PROPOFOL N/A 02/23/2019   Procedure: ESOPHAGOGASTRODUODENOSCOPY (EGD) WITH PROPOFOL;  Surgeon: Midge Minium, MD;  Location: [facility];  Service: Endoscopy;   Laterality: N/A;   ESOPHAGOSCOPY WITH DILITATION  2012,2015   [facility] [place]   EYE SURGERY     FLEXIBLE BRONCHOSCOPY N/A 10/30/2021   Procedure: FLEXIBLE BRONCHOSCOPY;  Surgeon: Raechel Chute, MD;  Location: [facility];  Service: Pulmonary;  Laterality: N/A;   GASTRECTOMY     gastric ulcer  1989, 1991   IR IMAGING GUIDED PORT INSERTION  11/14/2021   JOINT REPLACEMENT     right total hip arthroplasty 03/03/02   LAPAROSCOPIC LYSIS OF ADHESIONS     LUNG CANCER SURGERY  2011   LYSIS OF ADHESION     PLACEMENT OF BREAST IMPLANTS  1973   thoracotomy with right upper lobectomy and central compartment node dissection     UPPER GASTROINTESTINAL ENDOSCOPY  10-29-2008   Dr Lemar Livings   VIDEO BRONCHOSCOPY WITH ENDOBRONCHIAL ULTRASOUND N/A 10/30/2021   Procedure: VIDEO BRONCHOSCOPY WITH ENDOBRONCHIAL ULTRASOUND;  Surgeon: Raechel Chute, MD;  Location: [facility];  Service: Pulmonary;  Laterality: N/A;    Social History   Socioeconomic History   Marital status: Widowed    Spouse name: Not on file   Number of children: 2   Years of education: Not on file   Highest education level: Not on file  Occupational History   Occupation: retired    Comment: Runner, broadcasting/film/video  Tobacco Use   Smoking status: Former    Current packs/day: 0.00    Average packs/day: 1 pack/day for 25.0 years (25.0 ttl pk-yrs)    Types: Cigarettes    Start date: 01/30/1983    Quit date: 01/30/2008    Years since quitting: 14.5   Smokeless tobacco: Never  Vaping Use   Vaping status: Never Used  Substance and Sexual Activity   Alcohol use: Yes    Comment: occ. for holiday   Drug use: No   Sexual activity: Not Currently  Other Topics Concern   Not on file  Social History Narrative   Lives alone    Social Determinants of Health   Financial Resource Strain: Not on file  Food Insecurity: No Food Insecurity (02/13/2022)   Hunger Vital Sign    Worried About Running Out of Food  in the Last Year: Never true    Ran Out of Food in the Last  Year: Never true  Transportation Needs: Unmet Transportation Needs (05/28/2022)   PRAPARE - Administrator, Civil Service (Medical): Yes    Lack of Transportation (Non-Medical): Yes  Physical Activity: Not on file  Stress: Not on file  Social Connections: Not on file  Intimate Partner Violence: Not At Risk (02/13/2022)   Humiliation, Afraid, Rape, and Kick questionnaire    Fear of Current or Ex-Partner: No    Emotionally Abused: No    Physically Abused: No    Sexually Abused: No     Allergies  Allergen Reactions   Hydromorphone Hcl Itching   Bentyl [Dicyclomine] Nausea And Vomiting   Biaxin [Clarithromycin] Other (See Comments)    hallucinations   Budesonide Other (See Comments)    Ulcer   Erythromycin Nausea And Vomiting   Reglan [Metoclopramide] Other (See Comments)    tremors     CBC    Component Value Date/Time   WBC 14.2 (H) 08/08/2022 1341   RBC 4.30 08/08/2022 1341   HGB 13.0 08/08/2022 1341   HGB 14.5 08/06/2022 1102   HCT 40.3 08/08/2022 1341   PLT 210 08/08/2022 1341   PLT 244 08/06/2022 1102   MCV 93.7 08/08/2022 1341   MCH 30.2 08/08/2022 1341   MCHC 32.3 08/08/2022 1341   RDW 15.1 08/08/2022 1341   LYMPHSABS 1.8 08/08/2022 1341   MONOABS 1.5 (H) 08/08/2022 1341   EOSABS 0.0 08/08/2022 1341   BASOSABS 0.0 08/08/2022 1341    Pulmonary Functions Testing Results:     No data to display          Outpatient Medications Prior to Visit  Medication Sig Dispense Refill   acetaminophen (TYLENOL) 500 MG tablet Take 500 mg by mouth every 6 (six) hours as needed.     amoxicillin-clavulanate (AUGMENTIN) 875-125 MG tablet Take 1 tablet by mouth 2 (two) times daily. 20 tablet 0   brimonidine-timolol (COMBIGAN) 0.2-0.5 % ophthalmic solution INT 1 GTT IN OU BID     Cholecalciferol (VITAMIN D3) 1000 units CAPS Take 2,000 Units by mouth daily.     diltiazem (CARDIZEM) 60 MG tablet Take 1 tablet (60 mg total) by mouth every 6 (six) hours. 180 tablet 0    diphenoxylate-atropine (LOMOTIL) 2.5-0.025 MG tablet Take 2 tablets by mouth 4 (four) times daily as needed for diarrhea or loose stools. 120 tablet 0   fluticasone (FLONASE) 50 MCG/ACT nasal spray Place 2 sprays into both nostrils daily.     Ipratropium-Albuterol (COMBIVENT) 20-100 MCG/ACT AERS respimat Inhale 1 puff into the lungs every 6 (six) hours. For SOB 4 g 1   loperamide (IMODIUM) 2 MG capsule Take 2 capsules (4 mg) at first loose stool then 1 capsule (2 mg) with each subsequent loose stool. Do not exceed 8 capsules (16 mg) in 24 hour period. 30 capsule 0   loratadine (CLARITIN) 10 MG tablet Take 10 mg by mouth daily.     magnesium chloride (SLOW-MAG) 64 MG TBEC SR tablet Take 1 tablet (64 mg total) by mouth daily. 14 tablet 0   Multiple Vitamin (MULTI-VITAMINS) TABS Take 1 tablet by mouth daily. once daily.     ondansetron (ZOFRAN) 8 MG tablet Take 8 mg by mouth every 8 (eight) hours as needed for nausea or vomiting.     oxyCODONE-acetaminophen (PERCOCET/ROXICET) 5-325 MG tablet Take 1 tablet by mouth every 8 (eight) hours as needed for severe  pain or moderate pain. 30 tablet 0   pantoprazole (PROTONIX) 40 MG tablet Take 40 mg by mouth 2 (two) times daily.     potassium chloride SA (KLOR-CON M) 20 MEQ tablet Take 1 tablet (20 mEq total) by mouth 2 (two) times daily. 30 tablet 0   prochlorperazine (COMPAZINE) 10 MG tablet Take 10 mg by mouth every 6 (six) hours as needed for nausea or vomiting.     promethazine-dextromethorphan (PROMETHAZINE-DM) 6.25-15 MG/5ML syrup Take 5 mLs by mouth at bedtime.     sucralfate (CARAFATE) 1 G tablet Take 1 g by mouth 4 (four) times daily.      traZODone (DESYREL) 150 MG tablet Take 300 mg by mouth at bedtime.      venlafaxine XR (EFFEXOR-XR) 150 MG 24 hr capsule Take 150 mg by mouth daily with breakfast.      docusate (COLACE) 50 MG/5ML liquid Take by mouth daily. (Patient not taking: Reported on 08/06/2022)     ipratropium-albuterol (DUONEB) 0.5-2.5 (3)  MG/3ML SOLN Take 3 mLs by nebulization every 6 (six) hours. (Patient not taking: Reported on 08/09/2022) 360 mL 0   ipratropium-albuterol (DUONEB) 0.5-2.5 (3) MG/3ML SOLN Take 3 mLs by nebulization every 6 (six) hours as needed. (Patient not taking: Reported on 08/09/2022) 360 mL 2   Facility-Administered Medications Prior to Visit  Medication Dose Route Frequency Provider Last Rate Last Admin   heparin lock flush 100 UNIT/ML injection

## 2022-08-16 ENCOUNTER — Ambulatory Visit: Payer: Medicare Other | Admitting: Gastroenterology

## 2022-08-20 ENCOUNTER — Inpatient Hospital Stay: Payer: Medicare Other

## 2022-08-20 ENCOUNTER — Inpatient Hospital Stay: Payer: Medicare Other | Admitting: Oncology

## 2022-08-30 DEATH — deceased
